# Patient Record
Sex: Female | Born: 1945 | Race: White | Hispanic: No | Marital: Married | Smoking: Former smoker
Health system: Southern US, Community
[De-identification: ages and names within clinical notes are randomized; demographics above are authoritative.]

## PROBLEM LIST (undated history)

## (undated) DIAGNOSIS — I719 Aortic aneurysm of unspecified site, without rupture: Secondary | ICD-10-CM

## (undated) DIAGNOSIS — IMO0002 Reserved for concepts with insufficient information to code with codable children: Secondary | ICD-10-CM

## (undated) DIAGNOSIS — G4700 Insomnia, unspecified: Secondary | ICD-10-CM

## (undated) DIAGNOSIS — K219 Gastro-esophageal reflux disease without esophagitis: Secondary | ICD-10-CM

## (undated) DIAGNOSIS — M48061 Spinal stenosis, lumbar region without neurogenic claudication: Secondary | ICD-10-CM

## (undated) DIAGNOSIS — D510 Vitamin B12 deficiency anemia due to intrinsic factor deficiency: Secondary | ICD-10-CM

## (undated) DIAGNOSIS — F419 Anxiety disorder, unspecified: Secondary | ICD-10-CM

## (undated) DIAGNOSIS — F17200 Nicotine dependence, unspecified, uncomplicated: Secondary | ICD-10-CM

## (undated) DIAGNOSIS — D497 Neoplasm of unspecified behavior of endocrine glands and other parts of nervous system: Secondary | ICD-10-CM

## (undated) DIAGNOSIS — B009 Herpesviral infection, unspecified: Secondary | ICD-10-CM

## (undated) DIAGNOSIS — M1990 Unspecified osteoarthritis, unspecified site: Secondary | ICD-10-CM

## (undated) DIAGNOSIS — F329 Major depressive disorder, single episode, unspecified: Secondary | ICD-10-CM

## (undated) DIAGNOSIS — J45909 Unspecified asthma, uncomplicated: Secondary | ICD-10-CM

## (undated) DIAGNOSIS — J329 Chronic sinusitis, unspecified: Secondary | ICD-10-CM

## (undated) DIAGNOSIS — D099 Carcinoma in situ, unspecified: Secondary | ICD-10-CM

## (undated) DIAGNOSIS — J449 Chronic obstructive pulmonary disease, unspecified: Secondary | ICD-10-CM

## (undated) DIAGNOSIS — I10 Essential (primary) hypertension: Secondary | ICD-10-CM

## (undated) DIAGNOSIS — M8580 Other specified disorders of bone density and structure, unspecified site: Secondary | ICD-10-CM

## (undated) DIAGNOSIS — F32A Depression, unspecified: Secondary | ICD-10-CM

## (undated) DIAGNOSIS — M722 Plantar fascial fibromatosis: Secondary | ICD-10-CM

## (undated) DIAGNOSIS — I2510 Atherosclerotic heart disease of native coronary artery without angina pectoris: Secondary | ICD-10-CM

## (undated) DIAGNOSIS — Q2381 Bicuspid aortic valve: Secondary | ICD-10-CM

## (undated) DIAGNOSIS — G5603 Carpal tunnel syndrome, bilateral upper limbs: Secondary | ICD-10-CM

## (undated) DIAGNOSIS — C801 Malignant (primary) neoplasm, unspecified: Secondary | ICD-10-CM

## (undated) DIAGNOSIS — Z95810 Presence of automatic (implantable) cardiac defibrillator: Secondary | ICD-10-CM

## (undated) DIAGNOSIS — I872 Venous insufficiency (chronic) (peripheral): Secondary | ICD-10-CM

## (undated) DIAGNOSIS — E538 Deficiency of other specified B group vitamins: Secondary | ICD-10-CM

## (undated) DIAGNOSIS — Q231 Congenital insufficiency of aortic valve: Secondary | ICD-10-CM

## (undated) HISTORY — PX: CATARACT EXTRACTION W/ INTRAOCULAR LENS  IMPLANT, BILATERAL: SHX1307

## (undated) HISTORY — DX: Chronic sinusitis, unspecified: J32.9

## (undated) HISTORY — DX: Deficiency of other specified B group vitamins: E53.8

## (undated) HISTORY — PX: CATARACT EXTRACTION: SUR2

## (undated) HISTORY — DX: Nicotine dependence, unspecified, uncomplicated: F17.200

## (undated) HISTORY — PX: COLONOSCOPY: SHX174

## (undated) HISTORY — PX: EYE SURGERY: SHX253

## (undated) HISTORY — DX: Vitamin B12 deficiency anemia due to intrinsic factor deficiency: D51.0

## (undated) HISTORY — DX: Herpesviral infection, unspecified: B00.9

## (undated) HISTORY — DX: Plantar fascial fibromatosis: M72.2

## (undated) HISTORY — DX: Other specified disorders of bone density and structure, unspecified site: M85.80

## (undated) HISTORY — DX: Depression, unspecified: F32.A

## (undated) HISTORY — DX: Unspecified osteoarthritis, unspecified site: M19.90

## (undated) HISTORY — DX: Bicuspid aortic valve: Q23.81

## (undated) HISTORY — DX: Anxiety disorder, unspecified: F41.9

## (undated) HISTORY — PX: BUNIONECTOMY WITH HAMMERTOE RECONSTRUCTION: SHX5600

## (undated) HISTORY — DX: Carcinoma in situ, unspecified: D09.9

## (undated) HISTORY — DX: Insomnia, unspecified: G47.00

## (undated) HISTORY — PX: FUNCTIONAL ENDOSCOPIC SINUS SURGERY: SUR616

## (undated) HISTORY — PX: UPPER GASTROINTESTINAL ENDOSCOPY: SHX188

## (undated) HISTORY — PX: MULTIPLE TOOTH EXTRACTIONS: SHX2053

## (undated) HISTORY — DX: Neoplasm of unspecified behavior of endocrine glands and other parts of nervous system: D49.7

## (undated) HISTORY — DX: Unspecified asthma, uncomplicated: J45.909

## (undated) HISTORY — DX: Carpal tunnel syndrome, bilateral upper limbs: G56.03

## (undated) HISTORY — DX: Major depressive disorder, single episode, unspecified: F32.9

## (undated) HISTORY — DX: Reserved for concepts with insufficient information to code with codable children: IMO0002

## (undated) HISTORY — PX: CYST EXCISION: SHX5701

## (undated) HISTORY — DX: Aortic aneurysm of unspecified site, without rupture: I71.9

## (undated) HISTORY — DX: Congenital insufficiency of aortic valve: Q23.1

## (undated) HISTORY — PX: OTHER SURGICAL HISTORY: SHX169

---

## 1997-04-29 DIAGNOSIS — D099 Carcinoma in situ, unspecified: Secondary | ICD-10-CM

## 1997-04-29 HISTORY — DX: Carcinoma in situ, unspecified: D09.9

## 1998-04-25 ENCOUNTER — Other Ambulatory Visit: Admission: RE | Admit: 1998-04-25 | Discharge: 1998-04-25 | Payer: Self-pay | Admitting: Obstetrics and Gynecology

## 1998-05-26 ENCOUNTER — Other Ambulatory Visit: Admission: RE | Admit: 1998-05-26 | Discharge: 1998-05-26 | Payer: Self-pay | Admitting: Obstetrics and Gynecology

## 1999-05-16 ENCOUNTER — Other Ambulatory Visit: Admission: RE | Admit: 1999-05-16 | Discharge: 1999-05-16 | Payer: Self-pay | Admitting: Obstetrics and Gynecology

## 2000-05-27 ENCOUNTER — Other Ambulatory Visit: Admission: RE | Admit: 2000-05-27 | Discharge: 2000-05-27 | Payer: Self-pay | Admitting: Obstetrics and Gynecology

## 2001-05-27 ENCOUNTER — Other Ambulatory Visit: Admission: RE | Admit: 2001-05-27 | Discharge: 2001-05-27 | Payer: Self-pay | Admitting: Obstetrics and Gynecology

## 2002-05-31 ENCOUNTER — Other Ambulatory Visit: Admission: RE | Admit: 2002-05-31 | Discharge: 2002-05-31 | Payer: Self-pay | Admitting: Obstetrics and Gynecology

## 2003-06-23 ENCOUNTER — Other Ambulatory Visit: Admission: RE | Admit: 2003-06-23 | Discharge: 2003-06-23 | Payer: Self-pay | Admitting: Obstetrics and Gynecology

## 2004-06-25 ENCOUNTER — Other Ambulatory Visit: Admission: RE | Admit: 2004-06-25 | Discharge: 2004-06-25 | Payer: Self-pay | Admitting: Obstetrics and Gynecology

## 2005-06-26 ENCOUNTER — Other Ambulatory Visit: Admission: RE | Admit: 2005-06-26 | Discharge: 2005-06-26 | Payer: Self-pay | Admitting: Addiction Medicine

## 2006-07-03 ENCOUNTER — Other Ambulatory Visit: Admission: RE | Admit: 2006-07-03 | Discharge: 2006-07-03 | Payer: Self-pay | Admitting: Obstetrics and Gynecology

## 2006-12-30 HISTORY — PX: CARPAL TUNNEL RELEASE: SHX101

## 2007-08-11 ENCOUNTER — Other Ambulatory Visit: Admission: RE | Admit: 2007-08-11 | Discharge: 2007-08-11 | Payer: Self-pay | Admitting: Obstetrics and Gynecology

## 2007-12-31 HISTORY — PX: OTHER SURGICAL HISTORY: SHX169

## 2008-01-26 ENCOUNTER — Ambulatory Visit: Payer: Self-pay | Admitting: Surgery

## 2009-02-23 ENCOUNTER — Ambulatory Visit: Payer: Self-pay | Admitting: Obstetrics and Gynecology

## 2009-02-23 ENCOUNTER — Encounter: Payer: Self-pay | Admitting: Obstetrics and Gynecology

## 2009-02-23 ENCOUNTER — Other Ambulatory Visit: Admission: RE | Admit: 2009-02-23 | Discharge: 2009-02-23 | Payer: Self-pay | Admitting: Obstetrics and Gynecology

## 2009-08-10 ENCOUNTER — Ambulatory Visit: Payer: Self-pay | Admitting: Obstetrics and Gynecology

## 2010-02-26 ENCOUNTER — Other Ambulatory Visit: Admission: RE | Admit: 2010-02-26 | Discharge: 2010-02-26 | Payer: Self-pay | Admitting: Obstetrics and Gynecology

## 2010-02-26 ENCOUNTER — Ambulatory Visit: Payer: Self-pay | Admitting: Obstetrics and Gynecology

## 2010-03-01 ENCOUNTER — Ambulatory Visit: Payer: Self-pay | Admitting: Obstetrics and Gynecology

## 2010-04-24 ENCOUNTER — Ambulatory Visit: Payer: Self-pay | Admitting: Obstetrics and Gynecology

## 2011-01-20 ENCOUNTER — Encounter: Payer: Self-pay | Admitting: Surgery

## 2011-03-06 ENCOUNTER — Encounter: Payer: Self-pay | Admitting: Obstetrics and Gynecology

## 2011-03-14 ENCOUNTER — Encounter: Payer: Self-pay | Admitting: Obstetrics and Gynecology

## 2011-04-17 ENCOUNTER — Encounter (INDEPENDENT_AMBULATORY_CARE_PROVIDER_SITE_OTHER): Payer: BC Managed Care – PPO | Admitting: Obstetrics and Gynecology

## 2011-04-17 ENCOUNTER — Other Ambulatory Visit (HOSPITAL_COMMUNITY)
Admission: RE | Admit: 2011-04-17 | Discharge: 2011-04-17 | Disposition: A | Discharge status: Home / Self Care | Payer: BC Managed Care – PPO | Source: Ambulatory Visit | Attending: Obstetrics and Gynecology | Admitting: Obstetrics and Gynecology

## 2011-04-17 ENCOUNTER — Other Ambulatory Visit: Payer: Self-pay | Admitting: Obstetrics and Gynecology

## 2011-04-17 DIAGNOSIS — Z01419 Encounter for gynecological examination (general) (routine) without abnormal findings: Secondary | ICD-10-CM

## 2011-04-17 DIAGNOSIS — Z13220 Encounter for screening for lipoid disorders: Secondary | ICD-10-CM

## 2011-04-17 DIAGNOSIS — Z124 Encounter for screening for malignant neoplasm of cervix: Secondary | ICD-10-CM | POA: Insufficient documentation

## 2011-05-14 NOTE — Consult Note (Signed)
NEW PATIENT CONSULTATION   Carol Warren, Carol Warren  DOB:  May 29, 1946                                        January 26, 2008  CHART #:  [id number]   REASON FOR CONSULTATION:  Ascending aortic aneurysm.   CLINICAL HISTORY:  I was asked by Dr.  Sudie Bailey to evaluate Carol Warren  for an ascending aortic aneurysm.  She is a 65 year old woman with a  history of heavy ongoing smoking who reports that she has felt poorly  since October 2008.  She felt like she was getting a typical sinus  infection that she gets frequently in the fall but it just did not get  better.  She reported symptoms of depression as well as marked fatigue  and inability to get very much done.  She was started on an  antidepressant and said that she feels somewhat better from that. She  said she was also recently started on another antibiotic and some  steroid nasal spray which has helped her sinusitis dramatically.  Given  her heavy smoking history and her symptoms she underwent a CT scan of  the chest.  This showed a small ascending aortic aneurysm with a maximum  diameter of about 4 cm in the mid ascending aorta.  There is mild  atherosclerotic calcification of the thoracic aorta as well as of the  aortic valve.  There were no pulmonary lesions identified and no  adenopathy.  A 1.7 x 1 cm right adrenal mass is also identified  consistent with adrenal adenoma.   REVIEW OF SYSTEMS:  GENERAL:  She denies any fever or chills.  She has  had no recent weight changes.  Her appetite has been stable. She does  report fatigue that has been present for many months.  HEENT:  Eyes:  Negative. ENT, negative.  ENDOCRINE:  She denies diabetes and  hypothyroidism.  CARDIOVASCULAR:  She denies any chest pain or pressure.  She does have exertional dyspnea.  She denies PND or orthopnea.  She has  a history of a heart murmur. She denies any palpitations or peripheral  edema.  RESPIRATORY:  She denies cough or sputum  production. She does  have wheezing.  GI:  She denies nausea or vomiting.  She has had no  melena or bright red blood per rectum. She does have reflux type  symptoms. GU:  She denies dysuria and hematuria. She does have urinary  frequency.  VASCULAR:  She denies claudication or phlebitis.  She has  never had DVT.  NEUROLOGICAL: She does report some dizziness at times.  She has never had a TIA or a stroke.  She denies focal weakness or  numbness.  MUSCULOSKELETAL: She has arthritis.  PSYCHIATRIC: She reports  symptoms of anxiety and depression.  HEMATOLOGICAL:  She denies a  history of bleeding disorders or easy bleeding.   ALLERGIES:  None.  She does have an intolerance to [facility]  and  DARVOCET  which she calls nausea.   PAST MEDICAL HISTORY:  Significant for COPD.  She has a history of  gastroesophageal reflux. She has a history of irritable bowel syndrome.  She has a history of degenerative disk disease.  She has a history of  bronchitis.  She is status post bilateral carpal tunnel surgery.  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   SOCIAL HISTORY:  She is married and lives with her husband.  She does  not work.  She has 2 children one of whom lives at home.  She smokes at  least 1 pack of cigarettes per day but denies alcohol abuse.   FAMILY HISTORY:  Negative for cardiac disease. Her father died of  pancreatic cancer.   PHYSICAL EXAMINATION:  VITAL SIGNS:  Blood pressure 145/89.  Pulse 90  and regular.  Respiratory rate is 18 and unlabored.  Oxygen saturation  on room air is 97 percent.  GENERAL:  She is a thin white female in no distress.  HEENT:  Shows to be normocephalic and atraumatic.  Pupils are equal,  round, and reactive to light and accommodation.  Extraocular muscles  intact.  Her throat is clear.  NECK:  Shows normal carotid pulses bilaterally.  There is a transmitted  murmur on both sides of her neck.  There is no adenopathy or   thyromegaly.  CARDIAC:  Shows a regular rate and rhythm with a normal  S1 and S2.  There is a grade 2/6 systolic murmur over the aorta.  LUNGS:  Clear with distant breath sounds throughout.  ABDOMINAL:  Shows active bowel sounds.  Abdomen is soft, flat, and non-  tender.  There are no palpable masses or organomegaly.  EXTREMITIES:  Exam shows no peripheral edema.  Pedal pulses are palpable  bilaterally.  SKIN:  Warm and dry.  NEUROLOGICAL:  Alert and oriented times 3.  Motor and sensory  examination is grossly normal.   CURRENT MEDICATIONS:  1. Flovent 220 micrograms b.i.d.  2. Loratadine 10 mg daily.  3. Fluticasone nasal spray 50 micrograms b.i.d.  4. Singulair 10 mg daily.  5. Omeprazole 20 mg 2 daily.  6. Estradiol 1 mg daily.  7. Hydroxyprogesterone 2.5 mg daily.  8. Trazodone 50 mg daily.  9. Pristiq 50 mg daily.  10.Cefdinir 300 mg b.i.d.  11.Calcium with vitamin D 2 daily.  12.Multivitamins daily.  13.Probiotic 1 daily.  14.Valium 5 mg daily p.r.n.  15.Mucinex p.r.n.   IMPRESSION:  Carol Warren has a small ascending aortic aneurysm at maximum  dimension of 4 cm.  I do not think this requires treatment at the time  but should be followed up in 1 year with her CT angiogram of the chest.  I usually do not recommend surgery unless the aneurysm is enlarging on  successive CT scans or has reached a diameter of 5.5 cm.  The adrenal  adenoma should be followed with her CT scan.  She does have a heart  murmur on examination and calcification in her aortic valve and I think  she should have a baseline echocardiogram to evaluate her aortic valve  and determine whether there is any degree of stenosis present.  I would  also recommend considering a stress test given her risk factors for  cardiac disease and her symptoms of persistent fatigue since fall 2008.  These symptoms certainly could be due to depression but fatigue is the  most common symptoms of significant atherosclerotic  coronary disease  that we see in women these days.  I will plan to see her back in 1 year  for followup CT angiogram of the chest to evaluate her aortic aneurysm  and will leave the decision to pursue an echocardiogram and stress test  with Dr. Sudie Bailey.   Evelene Croon, M.D.  Electronically Signed   BB/MEDQ  D:  01/26/2008  T:  01/27/2008  Job:  [id number]

## 2011-08-19 ENCOUNTER — Telehealth: Payer: Self-pay | Admitting: *Deleted

## 2011-08-19 NOTE — Telephone Encounter (Signed)
Carol Warren, I reviewed patient's chart we can put her on an oral estradiol 1 mg tablet that she can take daily and then used to Prometrium 200 mg orally for 12 days of the month. If she has any breakthrough bleeding other than a cyclical once a month bleed she will need to contact the office in followup with Dr. Eda Paschal. Will prescribe 30 tablets with 12 refills.

## 2011-08-19 NOTE — Telephone Encounter (Signed)
Patient c/o her estradiol patch not staying on, she is having to replace it all the time. She is asking for an estradiol pill. Please advise.

## 2011-08-20 MED ORDER — ESTRADIOL 1 MG PO TABS: 1.0000 mg | ORAL_TABLET | Freq: Every day | ORAL | Status: DC

## 2011-08-20 NOTE — Telephone Encounter (Signed)
Pt informed of Dr Fontaine No note and Rx sent to pharmacy.

## 2011-09-18 ENCOUNTER — Telehealth: Payer: Self-pay

## 2011-09-18 NOTE — Telephone Encounter (Signed)
YOU CHANGED PT ON 08-19-11 TO ESTRADIOL PILL IN DR. G'S PREVIOUS ABSENCE. ESTRADIOL ON LONG TERM BACK ORDER AND IS ON HER LAST PILL. WHAT CAN SHE SWITCH TO UNTIL ESTRADIOL PILLS AVAILABLE AGAIN?

## 2011-09-18 NOTE — Telephone Encounter (Signed)
Patient will be called and Estrace (generic) 1 mg to take by mouth daily scissors the shortage of transdermal estrogen patches. She still stay on the Prometrium 200 mg one tablet daily for 12 days of the month.

## 2011-09-19 NOTE — Telephone Encounter (Signed)
AFTER I NOTFIED PT. OF DR. FERNANDEZ'S NOTE BELOW 09-18-11. STATES SHE CAN NOT AFFORD BRAND PILLS. CHECKED WITH PHARMACY AFTER SHE HUNG UP WITH ME AND CHECKED WITH PHARMACY TO SEE IF SHE COULD SWITCH BACK TO THE GENERIC PATCHES UNTIL GENERIC ESTRADIOL AVAILABLE AGAIN. PT. CALLED BACK AND STATES HER [facility] JUST RECEIVED A SHIPMENT OF THE GENERIC ESTRADIOL.

## 2011-10-24 DIAGNOSIS — B009 Herpesviral infection, unspecified: Secondary | ICD-10-CM | POA: Insufficient documentation

## 2011-10-24 DIAGNOSIS — I719 Aortic aneurysm of unspecified site, without rupture: Secondary | ICD-10-CM | POA: Insufficient documentation

## 2011-10-24 DIAGNOSIS — F17200 Nicotine dependence, unspecified, uncomplicated: Secondary | ICD-10-CM | POA: Insufficient documentation

## 2011-10-24 DIAGNOSIS — M8580 Other specified disorders of bone density and structure, unspecified site: Secondary | ICD-10-CM | POA: Insufficient documentation

## 2011-10-24 DIAGNOSIS — IMO0002 Reserved for concepts with insufficient information to code with codable children: Secondary | ICD-10-CM | POA: Insufficient documentation

## 2011-10-24 DIAGNOSIS — D497 Neoplasm of unspecified behavior of endocrine glands and other parts of nervous system: Secondary | ICD-10-CM | POA: Insufficient documentation

## 2011-10-24 DIAGNOSIS — Q231 Congenital insufficiency of aortic valve: Secondary | ICD-10-CM | POA: Insufficient documentation

## 2011-10-24 DIAGNOSIS — D099 Carcinoma in situ, unspecified: Secondary | ICD-10-CM | POA: Insufficient documentation

## 2011-10-29 ENCOUNTER — Telehealth: Payer: Self-pay | Admitting: *Deleted

## 2011-10-29 NOTE — Telephone Encounter (Signed)
With the patch you can really not wean her. She just should stop the patch and not start Prometrium on NOV 1.

## 2011-10-29 NOTE — Telephone Encounter (Signed)
Pt informed

## 2011-10-29 NOTE — Telephone Encounter (Signed)
Pt wants to go off Estrogen she is on Vivelle 0.05 patch BIW and Prometrium 200 D1-12. How to wean off? pls Advise Pt states she spoke with you reagrding her low energy levels in April 2012 and you suggested to try the estrogen but shes been seeing her PCP and they believe her low energy is coming from low cortisol and low sodium.

## 2011-10-30 ENCOUNTER — Ambulatory Visit: Payer: BC Managed Care – PPO | Admitting: Obstetrics and Gynecology

## 2011-10-30 ENCOUNTER — Other Ambulatory Visit: Payer: BC Managed Care – PPO

## 2012-04-28 ENCOUNTER — Encounter: Payer: BC Managed Care – PPO | Admitting: Obstetrics and Gynecology

## 2012-05-05 ENCOUNTER — Other Ambulatory Visit: Payer: Self-pay | Admitting: Obstetrics and Gynecology

## 2012-05-05 DIAGNOSIS — M8580 Other specified disorders of bone density and structure, unspecified site: Secondary | ICD-10-CM

## 2013-03-31 ENCOUNTER — Other Ambulatory Visit: Payer: Self-pay | Admitting: Obstetrics and Gynecology

## 2013-06-23 ENCOUNTER — Encounter: Payer: Self-pay | Admitting: Internal Medicine

## 2013-06-25 ENCOUNTER — Ambulatory Visit (INDEPENDENT_AMBULATORY_CARE_PROVIDER_SITE_OTHER): Payer: Medicare Other | Admitting: Cardiology

## 2013-06-25 VITALS — BP 142/82 | HR 98 | Ht 61.0 in | Wt 134.8 lb

## 2013-06-25 DIAGNOSIS — I359 Nonrheumatic aortic valve disorder, unspecified: Secondary | ICD-10-CM

## 2013-06-25 DIAGNOSIS — I350 Nonrheumatic aortic (valve) stenosis: Secondary | ICD-10-CM

## 2013-06-25 NOTE — Progress Notes (Signed)
HPI The patient presents as a new patient for followup of aortic stenosis. She reports 3 bicuspid aortic valve.  She was previously followed [place].  She reports an echo one year ago. She also reports apparently aortic root dilatation 4 cm which has been stable on CT. She is relocating her care to our practice. She does not report any new symptoms. She does have some dyspnea but thinks this might be related to her long-standing smoking. She is apparently going to see a pulmonologist as well. She doesn't think this has deteriorated in the last year since her last echo. She doesn't report PND or orthopnea. He doesn't report cough fevers or chills about some baseline smoker's cough. She denies any chest pressure, neck or arm discomfort. She does notice palpitations, presyncope or syncope. She has been slightly limited by some problems. She is smoking cigarettes but she is determined to quit.   Allergies  Allergen Reactions  . Oxycodone   . Percocet (Oxycodone-Acetaminophen)     Current Outpatient Prescriptions  Medication Sig Dispense Refill  . Calcium Carbonate-Vitamin D (CALCIUM-D PO) Take by mouth.        . cycloSPORINE (RESTASIS) 0.05 % ophthalmic emulsion 1 drop 2 (two) times daily.        [name] DIAZEPAM PO Take 5 mg by mouth.        . estradiol (ESTRACE) 1 MG tablet Take 1 tablet (1 mg total) by mouth daily.  30 tablet  12  . Fluticasone Propionate, Inhal, (FLOVENT IN) Inhale into the lungs.        . Hydrocodone-Acetaminophen (VICODIN PO) Take by mouth.        . Lactobacillus (ACIDOPHILUS PO) Take by mouth.        . Montelukast Sodium (SINGULAIR PO) Take by mouth.        . Multiple Vitamin (MULTIVITAMIN) capsule Take 1 capsule by mouth daily.        [name] OMEPRAZOLE PO Take by mouth.        . progesterone (PROMETRIUM) 200 MG capsule Take 200 mg by mouth daily. Takes day 1-12 of month        . Ranitidine HCl (RANITIDINE 75 PO) Take by mouth.        . TRAZODONE HCL PO Take by mouth.          . valACYclovir (VALTREX) 500 MG tablet TAKE 1 TABLET A DAY  30 tablet  0   No current facility-administered medications for this visit.    Past Medical History  Diagnosis Date  . 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 Osteopenia   . HSV-1 (herpes simplex virus 1) infection   . HSV-2 (herpes simplex virus 2) infection   . Bicuspid aortic valve     CONGENITAL  . Aortic aneurysm     CARDIOLOGIST IS DR. Dulce Sellar IN [place]  . Adrenal tumor   . Degenerative disc disease     CERVICAL AND LUMBAR (DR. Ophelia Charter)  . Smoker     Past Surgical History  Procedure Laterality Date  . Excision of vulvar cis    . Sinus procedure  2009  . Excision of basal cell ca      SKIN   . Cataract extraction      X2    Family History  Problem Relation Age of Onset  . Cancer Mother     PANCREATIC  . Diabetes Father   . Hypertension Father   . Heart disease Father   .  Breast cancer Sister   . Diabetes Maternal Grandmother   . Hypertension Maternal Grandmother   . Heart disease Maternal Grandmother   . Cancer Maternal Grandfather     COLON  . Diabetes Paternal Grandmother   . Hypertension Paternal Grandmother   . Heart disease Paternal Grandmother     History   Social History  . Marital Status: Married    Spouse Name: N/A    Number of Children: N/A  . Years of Education: N/A   Occupational History  . Not on file.   Social History Main Topics  . Smoking status: Current Every Day Smoker    Types: Cigarettes  . Smokeless tobacco: Not on file  . Alcohol Use: Yes  . Drug Use:   . Sexually Active:    Other Topics Concern  . Not on file   Social History Narrative  . No narrative on file    ROS:  Recent bronchitis.  Otherwise as stated in the HPI and negative for all other systems.  PHYSICAL EXAM There were no vitals taken for this visit. GENERAL:  Well appearing HEENT:  Pupils equal round and reactive, fundi not visualized, oral mucosa unremarkable NECK:  No jugular  venous distention, waveform within normal limits, carotid upstroke brisk and symmetric, no bruits, no thyromegaly LYMPHATICS:  No cervical, inguinal adenopathy LUNGS:  Clear to auscultation bilaterally BACK:  No CVA tenderness CHEST:  Unremarkable HEART:  PMI not displaced or sustained,S1 and S2 within normal limits, no S3, no S4, no clicks, no rubs, 3/6 apical systolic murmur also at the right upper sternal border, early peaking, no diastolic murmurs ABD:  Flat, positive bowel sounds normal in frequency in pitch, no bruits, no rebound, no guarding, no midline pulsatile mass, no hepatomegaly, no splenomegaly EXT:  Mildly diminished bilateral dorsalis pedis and posterior tibialis plus pulses throughout, no edema, no cyanosis no clubbing SKIN:  No rashes no nodules NEURO:  Cranial nerves II through XII grossly intact, motor grossly intact throughout PSYCH:  Cognitively intact, oriented to person place and time  EKG:  Sinus rhythm rate 98, axis within normal limits, intervals within normal limits, no acute ST-T wave changes. 06/25/2013  ASSESSMENT AND PLAN  AORTIC STENOSIS:  I will start with an echocardiogram to further assess this which I do not suspect his severe by clinical findings. We did discuss the symptoms that could develop should she have progression.  AORTIC ROOT ANEURYSM:  This apparently has been stable and I will likely follow this up with CT after I had a chance to review the echo.  TOBACCO ABUSE:  I am delighted that she is committed to quitting smoking and we talked about the necessity of this.

## 2013-06-25 NOTE — Patient Instructions (Addendum)

## 2013-06-28 ENCOUNTER — Ambulatory Visit: Payer: BC Managed Care – PPO | Admitting: Internal Medicine

## 2013-07-05 ENCOUNTER — Institutional Professional Consult (permissible substitution): Payer: BC Managed Care – PPO | Admitting: Internal Medicine

## 2013-07-06 ENCOUNTER — Other Ambulatory Visit (HOSPITAL_COMMUNITY): Payer: Medicare Other

## 2013-07-07 ENCOUNTER — Telehealth: Payer: Self-pay | Admitting: *Deleted

## 2013-07-07 ENCOUNTER — Encounter: Payer: Self-pay | Admitting: Internal Medicine

## 2013-07-07 ENCOUNTER — Ambulatory Visit (HOSPITAL_COMMUNITY): Payer: Medicare Other | Attending: Cardiology

## 2013-07-07 ENCOUNTER — Ambulatory Visit (INDEPENDENT_AMBULATORY_CARE_PROVIDER_SITE_OTHER): Payer: Medicare Other | Admitting: Internal Medicine

## 2013-07-07 VITALS — BP 136/72 | HR 123 | Temp 97.9°F | Ht 60.75 in | Wt 133.0 lb

## 2013-07-07 DIAGNOSIS — Q224 Congenital tricuspid stenosis: Secondary | ICD-10-CM

## 2013-07-07 DIAGNOSIS — F17200 Nicotine dependence, unspecified, uncomplicated: Secondary | ICD-10-CM | POA: Insufficient documentation

## 2013-07-07 DIAGNOSIS — I359 Nonrheumatic aortic valve disorder, unspecified: Secondary | ICD-10-CM | POA: Insufficient documentation

## 2013-07-07 DIAGNOSIS — J449 Chronic obstructive pulmonary disease, unspecified: Secondary | ICD-10-CM | POA: Insufficient documentation

## 2013-07-07 DIAGNOSIS — I350 Nonrheumatic aortic (valve) stenosis: Secondary | ICD-10-CM

## 2013-07-07 DIAGNOSIS — J441 Chronic obstructive pulmonary disease with (acute) exacerbation: Secondary | ICD-10-CM

## 2013-07-07 NOTE — Telephone Encounter (Signed)
Medication list received by fax from pt.  Medication list corrected in chart based on this list sent by patient

## 2013-07-07 NOTE — Progress Notes (Signed)
Echocardiogram performed.  

## 2013-07-07 NOTE — Patient Instructions (Addendum)
The key is to stop smoking completely before smoking completely stops you - it's the most important aspect of your care  Work on inhaler technique:  relax and gently blow all the way out then take a nice smooth deep breath back in, triggering the inhaler at same time you start breathing in.  Hold for up to 5 seconds if you can.  Rinse and gargle with water when done   If your mouth or throat starts to bother you,   I suggest you time the inhaler to your dental care and after using the inhaler(s) brush teeth and tongue with a baking soda containing toothpaste and when you rinse this out, gargle with it first to see if this helps your mouth and throat.     Continue omeprazole before bfast and ranitidine at bedtime  GERD (REFLUX)  is an extremely common cause of respiratory symptoms, many times with no significant heartburn at all.    It can be treated with medication, but also with lifestyle changes including avoidance of late meals, excessive alcohol, smoking cessation, and avoid fatty foods, chocolate, peppermint, colas, red wine, and acidic juices such as orange juice.  NO MINT OR MENTHOL PRODUCTS SO NO COUGH DROPS  USE SUGARLESS CANDY INSTEAD (jolley ranchers or Stover's)  NO OIL BASED VITAMINS - use powdered substitutes.    Please schedule a follow up office visit in 6 weeks, call sooner if needed pfts

## 2013-07-07 NOTE — Progress Notes (Signed)
  Subjective:    Patient ID: Carol Warren, female    DOB: 1946-09-28  MRN: [id number]  HPI  67 yowf active smoker referred by Dr Sudie Bailey for cough and sob in setting of chronic rhinitis   07/07/2013 1st pulmonary eval/ prev eval by Gene Roebuck for allergic rhinitis and better p 10 years of shots stops she thinks around age 67 then started similar symptoms around 67 years ago > re-eval by Dr Nira Retort no shots needed, rx with clariton, singulair, advair and flonase with rare use of saba and has neb but doesn't use it.    Symptoms consist of daily nasal congestion assoc with dysphagia and globus  With symptomsworse day than night and watery nasal discharge assoc with sensation she can't get a deep breath at rest and min prod cough  Ex tol limited by weakness >> sob.   No obvious daytime variabilty or assoc c  cp or chest tightness, subjective wheeze overt sinus or hb symptoms. No unusual exp hx or h/o childhood pna/ asthma or knowledge of premature birth.   Sleeping ok without nocturnal  or early am exacerbation  of respiratory  c/o's or need for noct saba. Also denies any obvious fluctuation of symptoms with weather or environmental changes or other aggravating or alleviating factors except as outlined above      Review of Systems  Constitutional: Negative for fever, chills and unexpected weight change.  HENT: Positive for congestion, sneezing, trouble swallowing, dental problem, postnasal drip and sinus pressure. Negative for ear pain, nosebleeds, sore throat, rhinorrhea and voice change.   Eyes: Negative for visual disturbance.  Respiratory: Positive for cough and shortness of breath. Negative for choking.   Cardiovascular: Negative for chest pain and leg swelling.  Gastrointestinal: Negative for vomiting, abdominal pain and diarrhea.  Genitourinary: Negative for difficulty urinating.  Musculoskeletal: Negative for arthralgias.  Skin: Negative for rash.  Neurological: Positive for  headaches. Negative for tremors and syncope.  Hematological: Does not bruise/bleed easily.       Objective:   Physical Exam amb wf somewhat of a childlike personality very chatty and doesn't listen to question before answering inappropriately, rarely answering the question asked   HEENT mild turbinate edema.  Oropharynx no thrush or excess pnd or cobblestoning.  No JVD or cervical adenopathy. Mild accessory muscle hypertrophy. Trachea midline, nl thryroid. Chest was hyperinflated by percussion with diminished breath sounds and moderate increased exp time without wheeze. Hoover sign positive at mid inspiration. Regular rate and rhythm without murmur gallop or rub or increase P2 or edema.  Abd: no hsm, nl excursion. Ext warm without cyanosis or clubbing.    No cxr in our system        Assessment & Plan:

## 2013-07-11 NOTE — Assessment & Plan Note (Addendum)
DDX of  difficult airways managment all start with A and  include Adherence, Ace Inhibitors, Acid Reflux, Active Sinus Disease, Alpha 1 Antitripsin deficiency, Anxiety masquerading as Airways dz,  ABPA,  allergy(esp in young), Aspiration (esp in elderly), Adverse effects of DPI,  Active smokers, plus two Bs  = Bronchiectasis and Beta blocker use..and one C= CHF   Adherence is always the initial "prime suspect" and is a multilayered concern that requires a "trust but verify" approach in every patient - starting with knowing how to use medications, especially inhalers, correctly, keeping up with refills and understanding the fundamental difference between maintenance and prns vs those medications only taken for a very short course and then stopped and not refilled. The proper method of use, as well as anticipated side effects, of a metered-dose inhaler are discussed and demonstrated to the patient. Improved effectiveness after extensive coaching during this visit to a level of approximately  50% so needs work  ? Acid reflux > max rx and then return   ? Allergy > doubt given neg studies in past but may need to be repeated in symptoms persist  Active smoking discussed separately   ? chf  Note bicuspid valve > f/u planned  ? Adverse effect of advair > strongy consider trial off  Anxiety > dx of exclusion but based on her responses to questions may be higher up the differential here.

## 2013-07-11 NOTE — Assessment & Plan Note (Signed)

## 2013-07-12 ENCOUNTER — Encounter: Payer: Self-pay | Admitting: Internal Medicine

## 2013-07-13 ENCOUNTER — Telehealth: Payer: Self-pay | Admitting: Cardiology

## 2013-07-13 NOTE — Telephone Encounter (Signed)
1)  Pt calling requesting to know if she needs SBE before procedures 2)  Having eye lid surgery August 28th - is she ok to have this done under local 3)  Having surgery on foot but not scheduled yet - will be under local

## 2013-07-13 NOTE — Telephone Encounter (Signed)
New Prob      Pt has some questions regarding possible procedures she has planned and cardiac clearance. Please call.

## 2013-07-15 NOTE — Telephone Encounter (Signed)
Pt aware - copy mailed to pt to take to her MD

## 2013-07-15 NOTE — Telephone Encounter (Signed)
She does not need SBE.  She is OK for the planned procedure.  Call Ms. Hammersmith with the results and send results to [facility], [place]

## 2013-07-29 ENCOUNTER — Encounter: Payer: Self-pay | Admitting: Internal Medicine

## 2013-07-29 ENCOUNTER — Ambulatory Visit (INDEPENDENT_AMBULATORY_CARE_PROVIDER_SITE_OTHER): Payer: Medicare Other | Admitting: Internal Medicine

## 2013-07-29 VITALS — BP 132/80 | HR 112 | Ht 60.5 in | Wt 133.1 lb

## 2013-07-29 DIAGNOSIS — R1013 Epigastric pain: Secondary | ICD-10-CM

## 2013-07-29 DIAGNOSIS — R160 Hepatomegaly, not elsewhere classified: Secondary | ICD-10-CM

## 2013-07-29 DIAGNOSIS — R6881 Early satiety: Secondary | ICD-10-CM

## 2013-07-29 DIAGNOSIS — Z86010 Personal history of colonic polyps: Secondary | ICD-10-CM

## 2013-07-29 NOTE — Patient Instructions (Addendum)
You have been given a separate informational sheet regarding your tobacco use, the importance of quitting and local resources to help you quit.  You have been scheduled for an endoscopy with propofol. Please follow written instructions given to you at your visit today. If you use inhalers (even only as needed), please bring them with you on the day of your procedure. Your physician has requested that you go to www.startemmi.com and enter the access code given to you at your visit today. This web site gives a general overview about your procedure. However, you should still follow specific instructions given to you by our office regarding your preparation for the procedure.  Today you have signed a ROI for [place] to obtain your records from [facility] for procedures you have had the past 10 years.  We will also fax it to Dr. Theron Arista at [facility] in [facility].  You have been scheduled for an abdominal ultrasound at [facility] (1st floor of hospital) on 08/03/13 at 9:00am. Please arrive 15 minutes prior to your appointment for registration. Make certain not to have anything to eat or drink 6 hours prior to your appointment. Should you need to reschedule your appointment, please contact radiology at [phone]. This test typically takes about 30 minutes to perform.  I appreciate the opportunity to care for you.

## 2013-07-29 NOTE — Progress Notes (Addendum)
Subjective:  Carol Kingdom, MD   Patient ID: Carol Warren, female    DOB: 08-02-1946, 67 y.o.   MRN: [id number]  HPI This elderly white woman is here because of abdominal pain and hepatomegaly. She has had chronic anorexia "I do not know what to eat so I force myself" and some post-prandial bloating and epigastric pain. She belches frequently - even during interview. She has been told she has hepatomegaly based upon spine MR showing the liver. No known liver dz. She sometimes regurgitates food. She has some pill dysphagia. No melena or rectal bleeding. She has not lost weight in fact it has risen which is attributed to immobility from foot surgery.  She reports prior endoscopic evaluations by Drs,. Chales Abrahams and Noe Gens and treatment for + H. Pylori (serology). Reports hx polyps "2 last time and I was due in 2013".  Allergies  Allergen Reactions  . Oxycodone   . Percocet (Oxycodone-Acetaminophen)    Outpatient Prescriptions Prior to Visit  Medication Sig Dispense Refill  . albuterol (ACCUNEB) 0.63 MG/3ML nebulizer solution Take 1 ampule by nebulization every 6 (six) hours as needed for wheezing.      [name] albuterol (PROVENTIL) (2.5 MG/3ML) 0.083% nebulizer solution Take 2.5 mg by nebulization 2 (two) times daily as needed for wheezing.      . Calcium Carbonate-Vitamin D (CALCIUM-D PO) Take 1 tablet by mouth daily.       [name] DIAZEPAM PO Take 10 mg by mouth 3 (three) times daily as needed.       . fluticasone (FLONASE) 50 MCG/ACT nasal spray Place 1 spray into the nose 2 (two) times daily.      [name] gabapentin (NEURONTIN) 100 MG capsule Take 100 mg by mouth 3 (three) times daily.      [name] ibuprofen (ADVIL,MOTRIN) 200 MG tablet Take 200 mg by mouth every 6 (six) hours as needed for pain.      [name] loratadine (CLARITIN) 10 MG tablet Take 10 mg by mouth daily.      . mirtazapine (REMERON) 15 MG tablet Take 15 mg by mouth at bedtime.      . Montelukast Sodium (SINGULAIR PO) Take 1 tablet by mouth daily.        . Multiple Vitamin (MULTIVITAMIN) capsule Take 1 capsule by mouth daily.        [name] OMEPRAZOLE PO Take 40 mg by mouth every morning.       . ranitidine (ZANTAC) 300 MG capsule Take 300 mg by mouth every evening.      [name] VITAMIN B1-B12 IM Inject 1 mL into the muscle once a week.      . Fluticasone-Salmeterol (ADVAIR) 250-50 MCG/DOSE AEPB Inhale 1 puff into the lungs every 12 (twelve) hours.      . mometasone (NASONEX) 50 MCG/ACT nasal spray Place 1 spray into the nose daily.       No facility-administered medications prior to visit.   Past Medical History  Diagnosis Date  . 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 Osteopenia   . HSV-1 (herpes simplex virus 1) infection   . HSV-2 (herpes simplex virus 2) infection   . Bicuspid aortic valve     CONGENITAL  . Aortic aneurysm     CARDIOLOGIST IS DR. Dulce Sellar IN [place]  . Adrenal tumor   . Degenerative disc disease     CERVICAL AND LUMBAR (DR. Ophelia Charter)  . Smoker   . Anxiety   . Chronic sinusitis   . Chronic  depression   . Osteoarthritis   . Carpal tunnel syndrome, bilateral   . Pernicious anemia   . Insomnia   . Asthma   . Pericardial cyst   . Chronic fatigue   . Vitamin B 12 deficiency   . Plantar fasciitis   . Hammer toe of right foot   . Hepatomegaly   . Rosacea    Past Surgical History  Procedure Laterality Date  . Excision of vulvar cis    . Sinus procedure  2009    x 6  . Excision of basal cell ca      SKIN   . Cataract extraction Bilateral     X2  . Carpal tunnel release Bilateral 2008  . Bunionectomy with hammertoe reconstruction Right   . Colonoscopy    . Upper gastrointestinal endoscopy     History   Social History  . Marital Status: Married    Spouse Name: N/A    Number of Children: 2  .     Occupational History  . retired    Social History Main Topics  . Smoking status: Current Every Day Smoker -- 1.00 packs/day for 50 years    Types: Cigarettes  . Smokeless tobacco: Never Used     Comment:  uses vapor cig  . Alcohol Use: No  . Drug Use: No    Family History  Problem Relation Age of Onset  . Pancreatic cancer Mother 84  . Diabetes Father   . Hypertension Father   . Heart disease Father 6    CAD  . Breast cancer Sister   . Diabetes Maternal Grandmother   . Hypertension Maternal Grandmother   . Cancer Maternal Grandfather     colon or stomach  . Hypertension Paternal Grandmother   . Heart disease Paternal Grandmother     Later onset  . Asthma Grandchild   . Diverticulosis Paternal Grandmother        Review of Systems Chronic back pain.allergies, fatigue, depression, thirst, headaches. All other ROS negative or as per HPI.    Objective:   Physical Exam General:  Well-developed, well-nourished and in no acute distress, belching frequently Eyes:  anicteric. ENT:   Mouth and posterior pharynx free of lesions. + dentures Neck:   supple w/o thyromegaly or mass.  Lungs: Clear to auscultation bilaterally but BS diminished throughout Heart:  S1S2, w/ 2/6 SEM RUSB. Abdomen:  soft, non-tender, no hepatosplenomegaly, hernia, or mass and BS+.  Lymph:  no cervical or supraclavicular adenopathy. Extremities:   no edema Skin   no rash. Neuro:  A&O x 3.  Psych:  appropriate mood and  Affect.   Data Reviewed: Dr. Thurston Hole note 06/2013 PCP info     Assessment & Plan:  Abdominal pain, epigastric  Early satiety  Hepatomegaly  Personal history of colonic polyps  1. EGD to evaluate upper GI sxs. The risks and benefits as well as alternatives of endoscopic procedure(s) have been discussed and reviewed. All questions answered. The patient agrees to proceed. 2. US Abdomen re: report of hepatomegaly and upper GISxs 3. Obtain colonoscopy and pathology records to see when colonoscopy due   Had adenoma i 2004 and adenoma in 2009 - will call her and see if we can arrange double egd/colonoscopy  I appreciate the opportunity to care for this patient. WU:[email or web address],[id number],  MD

## 2013-07-30 DIAGNOSIS — Z860100 Personal history of colon polyps, unspecified: Secondary | ICD-10-CM | POA: Insufficient documentation

## 2013-08-02 ENCOUNTER — Telehealth: Payer: Self-pay

## 2013-08-02 NOTE — Telephone Encounter (Signed)
Message copied by [place], Christal Lagerstrom E on Mon Aug 02, 2013  1:44 PM ------      Message from: Iva Boop      Created: Fri Jul 30, 2013 12:48 PM      Regarding: does need colonoscopy       Let her know that she was correct - is due for a colonoscopy because of hx polyps            Can change to EGD/colon but will need different day and/or time I think            Or we can get to colonoscopy later this year after EGD is done ------

## 2013-08-02 NOTE — Telephone Encounter (Signed)
Spoke to patient and cancelled her EGD for 08/16/13 and R/S her for an EGD/Colon per Dr. Leone Payor after reviewing her records. Appointment for pre-visit 09/01/13 at 10:30am, procedure 09/10/13 at 2:00pm.  Will need sample prep kit /coupon if available.

## 2013-08-02 NOTE — Telephone Encounter (Signed)
Left message on voice mail to call me back

## 2013-08-03 ENCOUNTER — Other Ambulatory Visit (HOSPITAL_COMMUNITY): Payer: Medicare Other

## 2013-08-04 ENCOUNTER — Other Ambulatory Visit: Payer: Self-pay

## 2013-08-05 ENCOUNTER — Telehealth: Payer: Self-pay

## 2013-08-05 NOTE — Telephone Encounter (Signed)
Patient called in to cancel her pre-visit, and colonoscopy appointments .  She also informed me that she cancelled her U/S appointment.  She has had to have spinal injections, and upcoming eye surgery in the near future.  She said it's too much to do right now.  She plans on calling back and R/S'ing all these appointments.

## 2013-08-06 ENCOUNTER — Ambulatory Visit (HOSPITAL_COMMUNITY): Payer: Medicare Other

## 2013-08-10 ENCOUNTER — Ambulatory Visit (HOSPITAL_COMMUNITY): Payer: Medicare Other

## 2013-08-16 ENCOUNTER — Encounter: Payer: Medicare Other | Admitting: Internal Medicine

## 2013-08-19 ENCOUNTER — Ambulatory Visit: Payer: Medicare Other | Admitting: Internal Medicine

## 2013-09-10 ENCOUNTER — Encounter: Payer: Medicare Other | Admitting: Internal Medicine

## 2013-09-22 ENCOUNTER — Telehealth: Payer: Self-pay | Admitting: Cardiology

## 2013-09-22 NOTE — Telephone Encounter (Signed)
Called patient back. She has had a sinus infection since 9/3. Complaining of chest pain with cough. She reported this to her PCP who placed her on Augumentin and was told that she needed to see cardiology because of the chest pain and history of AS. Offered her appointment with Dr.Hochrein at 915 am but she declined because it was too early in the AM. Set her up with Tereso Newcomer PA for tomorrow visit in the late AM.

## 2013-09-22 NOTE — Telephone Encounter (Signed)
New Problem  Pt states primary care is treating her for a server sinus infection... Pt says her entire left side of her chest and left nostril are tight. No pain just uncomfortable.  Pt wanted to know if this was normal with the sinus infection.. Due to the fact that her heart has had prior issues she wanted to be sure that it is the sinus infection and not heart related issue.[name] Please advise.

## 2013-09-23 ENCOUNTER — Ambulatory Visit: Payer: Medicare Other | Admitting: Physician Assistant

## 2013-09-30 ENCOUNTER — Telehealth: Payer: Self-pay | Admitting: *Deleted

## 2013-09-30 DIAGNOSIS — R160 Hepatomegaly, not elsewhere classified: Secondary | ICD-10-CM

## 2013-09-30 DIAGNOSIS — R109 Unspecified abdominal pain: Secondary | ICD-10-CM

## 2013-09-30 NOTE — Telephone Encounter (Signed)
Abdominal ultrasound has been scheduled at [facility] [phone]) for Thursday, 10/07/13 @ 8 am with a 7:30 am arrival. Patient to go to outpatient registration for appointment. We have faxed orders to [phone]. Patient has been advised of ultrasound appointment time, date and location and have given instructions to be NPO 6 hours prior to her test. She is also scheduled for an appointment with Dr Leone Payor for 10/12/13 @ 2:15 pm. She verbalizes understanding of this as well.

## 2013-09-30 NOTE — Telephone Encounter (Signed)
Message copied by Richardson Chiquito on Thu Sep 30, 2013 10:03 AM ------      Message from: Stan Head E      Created: Thu Sep 30, 2013  8:35 AM      Regarding: RE: follow-up       OK - order US abdomen re: hepatomegaly and abdominal pain - set up rev after            Tell her to have them send me a copy      ----- Message -----         From: Patti E [place], CMA         Sent: 09/30/2013           To: Iva Boop, MD      Subject: RE: follow-up                                            Patient currently has sinus infection, on antibiotics for 2.5 weeks .  Her eye surgery and back injections have been completed.  Has toe sugery upcoming, awaiting date for that.  She said she would like to  Set up the U/S abdomen that she had to cancel with [place] back in August.  She wants to do this at [facility] if that is ok with you.  She doesn't feel well enough to do the EGD/colonoscopy at this time.  I told her we would discuss this and I would be in touch.      ----- Message -----         From: Iva Boop, MD         Sent: 09/26/2013   2:08 PM           To: Patti E [place], CMA      Subject: follow-up                                                Please see if she is ready to do EGD and colonoscopy      She may schedule REV if desired, instead             ------

## 2013-10-12 ENCOUNTER — Ambulatory Visit: Payer: Medicare Other | Admitting: Internal Medicine

## 2013-10-20 ENCOUNTER — Telehealth: Payer: Self-pay | Admitting: Cardiology

## 2013-10-20 NOTE — Telephone Encounter (Signed)
Follow up     Pt is calling .... Pt needs a follow up appt asap but she can not get her.   Please call her    Thanks!

## 2013-10-20 NOTE — Telephone Encounter (Signed)
Pt reports she had foot surgery yesterday and the MD that did the surgery said he HR was elevated.  (HR 113)  She states she was very nervous and scared.  She is also on antibiotics for a sinus infection and feels like she is dehydrated as well.  She doesn't feel like she needs to come into the office as this time.  She will keep a check on it and call back to schedule once her foot heals.

## 2013-10-28 ENCOUNTER — Ambulatory Visit: Payer: Medicare Other | Admitting: Internal Medicine

## 2013-11-04 ENCOUNTER — Other Ambulatory Visit: Payer: Self-pay

## 2013-11-18 ENCOUNTER — Telehealth: Payer: Self-pay | Admitting: Cardiology

## 2013-11-18 NOTE — Telephone Encounter (Signed)
The pt called the office because she wanted to clarify that she is not having another foot surgery.  The pt now needs to have back surgery with Dr Otelia [facility]) due to a herniated disc as a result of her previous foot surgery. The pt has not been released from Dr Lajoyce Corners at this time from her foot surgery.  At this time the pt's surgery is not scheduled but she will require surgical clearance from Dr Antoine Poche.  Per the pt it sounds like Pam RN has already received the fax in regards to clearance. I will forward this message to [facility].

## 2013-11-18 NOTE — Telephone Encounter (Signed)
Follow Up  Pt returning call //The request for surgery was for her back and not her foot// Please call back to discuss//

## 2013-11-19 NOTE — Telephone Encounter (Signed)
Requested pt let us know when she is ready for the surgery and we can send clearance then.  When I spoke with her Wednesday she said she was in no way ready to have surgery at this time.

## 2014-01-11 ENCOUNTER — Ambulatory Visit: Payer: Medicare Other | Admitting: Nurse Practitioner

## 2014-01-18 ENCOUNTER — Encounter: Payer: Self-pay | Admitting: Cardiology

## 2014-01-31 ENCOUNTER — Ambulatory Visit (INDEPENDENT_AMBULATORY_CARE_PROVIDER_SITE_OTHER): Payer: Medicare Other | Admitting: Cardiology

## 2014-01-31 ENCOUNTER — Encounter: Payer: Self-pay | Admitting: Cardiology

## 2014-01-31 VITALS — BP 130/70 | HR 110 | Ht 61.0 in | Wt 131.0 lb

## 2014-01-31 DIAGNOSIS — I719 Aortic aneurysm of unspecified site, without rupture: Secondary | ICD-10-CM

## 2014-01-31 DIAGNOSIS — Q231 Congenital insufficiency of aortic valve: Secondary | ICD-10-CM

## 2014-01-31 NOTE — Patient Instructions (Addendum)
The current medical regimen is effective;  continue present plan and medications.  You will be contacted about scheduling your CT Angiogram to look at your Aortic Insuffiency, stenosis and size of your aorta. You are due for this in July 2015  Follow up with Dr Percival Spanish in July after your testing.

## 2014-01-31 NOTE — Progress Notes (Signed)
HPI The patient presents as a new patient for followup of aortic stenosis.  She was previously followed [place].  This is her second visit with me.  Since I saw her she has had 2 foot surgeries. She denies any new cardiovascular symptoms however. She does have long-standing dyspnea and did see Dr. Melvyn Novas but was unable to followup with pulmonary function testing. She did have an echocardiogram which demonstrated moderately severe aortic stenosis and insufficiency with a well preserved ejection fraction. She denies any new chest pressure, neck or arm discomfort. She feels drainage from her sinuses and has fullness in her left chest related to this. She denies any neck or arm discomfort. She does notice palpitations, presyncope or syncope. She has been slightly limited her foot problems and now back pain. She is smoking cigarettes  Allergies  Allergen Reactions  . Oxycodone   . Percocet [Oxycodone-Acetaminophen]     Current Outpatient Prescriptions  Medication Sig Dispense Refill  . albuterol (ACCUNEB) 0.63 MG/3ML nebulizer solution Take 1 ampule by nebulization every 6 (six) hours as needed for wheezing.      [name] albuterol (PROVENTIL) (2.5 MG/3ML) 0.083% nebulizer solution Take 2.5 mg by nebulization 2 (two) times daily as needed for wheezing.      . AMBULATORY NON FORMULARY MEDICATION Topical nasal rinse/ Tobram Budesonide Use nasal as needed      . B Complex Vitamins (VITAMIN B COMPLEX PO) Take 1 tablet by mouth daily.      . Calcium Carbonate-Vitamin D (CALCIUM-D PO) Take 1 tablet by mouth daily.       [name] DIAZEPAM PO Take 10 mg by mouth 3 (three) times daily as needed.       . fluticasone (FLONASE) 50 MCG/ACT nasal spray Place 1 spray into the nose 2 (two) times daily.      [name] gabapentin (NEURONTIN) 100 MG capsule Take 100 mg by mouth 3 (three) times daily.      [name] HYDROcodone-acetaminophen (NORCO/VICODIN) 5-325 MG per tablet Take 1 tablet by mouth every 6 (six) hours as needed for moderate  pain.      [name] ibuprofen (ADVIL,MOTRIN) 200 MG tablet Take 200 mg by mouth every 6 (six) hours as needed for pain.      [name] lidocaine (LIDODERM) 5 % Place 1 patch onto the skin daily. Remove & Discard patch within 12 hours or as directed by MD      . loratadine (CLARITIN) 10 MG tablet Take 10 mg by mouth daily.      . mirtazapine (REMERON) 30 MG tablet Take 30 mg by mouth at bedtime.      . Montelukast Sodium (SINGULAIR PO) Take 1 tablet by mouth daily.       . Multiple Vitamin (MULTIVITAMIN) capsule Take 1 capsule by mouth daily.        [name] OMEPRAZOLE PO Take 40 mg by mouth every morning.       . Probiotic Product (PROBIOTIC DAILY PO) Take by mouth daily. Ultimate flora      . ranitidine (ZANTAC) 300 MG capsule Take 300 mg by mouth every evening.      [name] VITAMIN B1-B12 IM Inject 1 mL into the muscle once a week.       No current facility-administered medications for this visit.    Past Medical History  Diagnosis Date  . 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 Osteopenia   . HSV-1 (herpes simplex virus 1) infection   . HSV-2 (herpes  simplex virus 2) infection   . Bicuspid aortic valve     CONGENITAL  . Aortic aneurysm     CARDIOLOGIST IS DR. Bettina Gavia IN [place]  . Adrenal tumor   . Degenerative disc disease     CERVICAL AND LUMBAR (DR. Lorin Mercy)  . Smoker   . Anxiety   . Chronic sinusitis   . Chronic depression   . Osteoarthritis   . Carpal tunnel syndrome, bilateral   . Pernicious anemia   . Insomnia   . Asthma   . Pericardial cyst   . Chronic fatigue   . Vitamin B 12 deficiency   . Plantar fasciitis   . Hammer toe of right foot   . Hepatomegaly   . Rosacea   . Esophagitis   . Gastritis     Past Surgical History  Procedure Laterality Date  . Excision of vulvar cis    . Sinus procedure  2009    x 6  . Excision of basal cell ca      SKIN   . Cataract extraction Bilateral     X2  . Carpal tunnel release Bilateral 2008  . Bunionectomy with hammertoe reconstruction  Right   . Colonoscopy    . Upper gastrointestinal endoscopy      ROS:  As stated in the HPI and negative for all other systems.  PHYSICAL EXAM BP 130/70  Pulse 110  Ht 5\' 1"  (1.549 m)  Wt 131 lb (59.421 kg)  BMI 24.76 kg/m2 GENERAL:  Well appearing HEENT:  Pupils equal round and reactive, fundi not visualized, oral mucosa unremarkable NECK:  No jugular venous distention, waveform within normal limits, carotid upstroke brisk and symmetric, no bruits, no thyromegaly LYMPHATICS:  No cervical, inguinal adenopathy LUNGS:  Clear to auscultation bilaterally BACK:  No CVA tenderness CHEST:  Unremarkable HEART:  PMI not displaced or sustained,S1 and S2 within normal limits, no S3, no S4, no clicks, no rubs, 3/6 apical systolic murmur also at the right upper sternal border, early peaking, no diastolic murmurs ABD:  Flat, positive bowel sounds normal in frequency in pitch, no bruits, no rebound, no guarding, no midline pulsatile mass, no hepatomegaly, no splenomegaly EXT:  Mildly diminished bilateral dorsalis pedis and posterior tibialis plus pulses throughout, no edema, no cyanosis no clubbing SKIN:  No rashes no nodules NEURO:  Cranial nerves II through XII grossly intact, motor grossly intact throughout PSYCH:  Cognitively intact, oriented to person place and time  EKG:  Sinus rhythm rate 110, LAD, intervals within normal limits, no acute ST-T wave changes. 01/31/2014  ASSESSMENT AND PLAN  AORTIC STENOSIS:  I do not think she has acute symptoms related to this. However, I will need to follow this closely. I plan on having a CT angiogram done in July particularly to size her aortic dilatation. We can also get an assessment of her regurgitant fraction and LV size and function with this.  AORTIC ROOT ANEURYSM:  As above.   TOBACCO ABUSE:  She doesn't think she would be adequate at this point although we discussed this again.  PREOP:  The patient is being considered for back surgery. She would  be at acceptable risk for this without further cardiovascular testing according to to ACC/AHA guidelines.

## 2014-02-08 ENCOUNTER — Ambulatory Visit: Payer: Medicare Other | Admitting: Cardiology

## 2014-03-14 ENCOUNTER — Telehealth: Payer: Self-pay | Admitting: Internal Medicine

## 2014-03-14 NOTE — Telephone Encounter (Signed)
I have helped the patient reschedule the procedures/pre-visit  from last fall.  She is advised that she will need to hold her pain meds the am of the procedure

## 2014-03-28 ENCOUNTER — Telehealth: Payer: Self-pay | Admitting: Cardiology

## 2014-03-28 NOTE — Telephone Encounter (Signed)
°  Patient is having a MRI Lumbar Mylogram that has been scheduled by Ortho Surg. Dr Louanne Skye. It is scheduled at [facility]. She wants to make sure its okay to have it that far away from [facility]. Please call and advise.

## 2014-03-28 NOTE — Telephone Encounter (Signed)
OK to have procedure and pt was given clearance in 01/2014 - she is aware.

## 2014-05-03 ENCOUNTER — Telehealth: Payer: Self-pay | Admitting: Cardiology

## 2014-05-03 NOTE — Telephone Encounter (Signed)
Discussed again with Dr Percival Spanish - no changes at this time.

## 2014-05-03 NOTE — Telephone Encounter (Signed)
Spoke with pt who was seen recently for sinusitis at an Urgent Care.  She was started on prednisone and an antibiotic.  She noticed her left leg started swelling and then both legs were.  She tried elevating them but it didn't help.  She went back to the Urgent Care and was given an RX for Furosemide 20 mg.  She was instructed to take one in the am and to eat a banana.  States the MD there wanted her to let Dr Percival Spanish know what was going on.  Advised I will sent this information to him for his knowledge.  She will f/u as scheduled.

## 2014-05-03 NOTE — Telephone Encounter (Signed)
New message          Pt heart rate 115 bp 138/90 ekg a little irregular / pt was seen at urgent care and wanted to let you know that prednisone makes you swell.

## 2014-05-03 NOTE — Telephone Encounter (Signed)
Move up her echocardiogram for AI.

## 2014-05-24 ENCOUNTER — Telehealth: Payer: Self-pay | Admitting: Cardiology

## 2014-05-24 ENCOUNTER — Encounter: Payer: Medicare Other | Admitting: Internal Medicine

## 2014-05-24 NOTE — Telephone Encounter (Signed)
This message came from the answering service from yesterday. Her left leg is swollen. Her internal medicine doctor,Dr Proschnaw ordered her an ultrasound, please call her back.

## 2014-05-24 NOTE — Telephone Encounter (Signed)
Per phone call from pt - she had a PV study due to the edema in her legs. She heard them say the swelling was from her valves being weak.  She thought that meant her heart valves and she was concerned.  Explained to pt that if the testing she had was on her legs they were talking about the valves in her veins.  Advised to decrease NA, keep feet and legs elevated above the level of her heart and wear compression stockings during the day.  She was given an RX for Furosemide which she states didn't help so she stopped taking it.  She is having the results of her recent testing sent to be reviewed by Dr Percival Spanish.  She will call back with further questions or concerns.

## 2014-06-21 ENCOUNTER — Telehealth: Payer: Self-pay | Admitting: Cardiology

## 2014-06-21 NOTE — Telephone Encounter (Signed)
Spoke with patient regarding appointment for CTA of chest for aortic stenosis.  Scheduled for 06/30/14 @ 11:30 am.  Patient was told to arrive at the admissions office at 10:15 am to register for lab work and CTA is scheduled for 11:30 am.  NPO 4 hours prior to testing.  Patient voiced understanding.

## 2014-06-24 ENCOUNTER — Other Ambulatory Visit: Payer: Self-pay | Admitting: *Deleted

## 2014-06-24 DIAGNOSIS — Z01810 Encounter for preprocedural cardiovascular examination: Secondary | ICD-10-CM

## 2014-06-28 ENCOUNTER — Other Ambulatory Visit: Payer: Self-pay | Admitting: Specialist

## 2014-06-28 DIAGNOSIS — M549 Dorsalgia, unspecified: Secondary | ICD-10-CM

## 2014-06-30 ENCOUNTER — Ambulatory Visit (HOSPITAL_COMMUNITY)
Admission: RE | Admit: 2014-06-30 | Discharge: 2014-06-30 | Disposition: A | Discharge status: Home / Self Care | Payer: Medicare Other | Source: Ambulatory Visit | Attending: Cardiology | Admitting: Cardiology

## 2014-06-30 DIAGNOSIS — J9819 Other pulmonary collapse: Secondary | ICD-10-CM | POA: Insufficient documentation

## 2014-06-30 DIAGNOSIS — D3500 Benign neoplasm of unspecified adrenal gland: Secondary | ICD-10-CM | POA: Insufficient documentation

## 2014-06-30 DIAGNOSIS — J438 Other emphysema: Secondary | ICD-10-CM | POA: Insufficient documentation

## 2014-06-30 DIAGNOSIS — Q231 Congenital insufficiency of aortic valve: Secondary | ICD-10-CM | POA: Insufficient documentation

## 2014-06-30 DIAGNOSIS — I719 Aortic aneurysm of unspecified site, without rupture: Secondary | ICD-10-CM | POA: Insufficient documentation

## 2014-06-30 LAB — BASIC METABOLIC PANEL
Anion gap: 15 (ref 5–15)
BUN: 19 mg/dL (ref 6–23)
CO2: 25 meq/L (ref 19–32)
Calcium: 9.7 mg/dL (ref 8.4–10.5)
Chloride: 97 mEq/L (ref 96–112)
Creatinine, Ser: 0.92 mg/dL (ref 0.50–1.10)
GFR calc Af Amer: 73 mL/min — ABNORMAL LOW (ref >90)
GFR calc non Af Amer: 63 mL/min — ABNORMAL LOW (ref >90)
Glucose, Bld: 122 mg/dL — ABNORMAL HIGH (ref 70–99)
Potassium: 4.9 mEq/L (ref 3.7–5.3)
Sodium: 137 mEq/L (ref 137–147)

## 2014-06-30 MED ORDER — IOHEXOL 350 MG/ML SOLN
80.0000 mL | Freq: Once | INTRAVENOUS | Status: AC | PRN
  Administered 2014-06-30: 80 mL via INTRAVENOUS

## 2014-07-06 ENCOUNTER — Inpatient Hospital Stay: Admission: RE | Admit: 2014-07-06 | Payer: Medicare Other | Source: Ambulatory Visit

## 2014-07-06 ENCOUNTER — Other Ambulatory Visit: Payer: Medicare Other

## 2014-07-14 ENCOUNTER — Inpatient Hospital Stay: Admission: RE | Admit: 2014-07-14 | Payer: Medicare Other | Source: Ambulatory Visit

## 2014-07-14 ENCOUNTER — Other Ambulatory Visit: Payer: Medicare Other

## 2014-08-01 ENCOUNTER — Other Ambulatory Visit: Payer: Medicare Other

## 2014-08-01 ENCOUNTER — Ambulatory Visit
Admission: RE | Admit: 2014-08-01 | Discharge: 2014-08-01 | Disposition: A | Discharge status: Home / Self Care | Payer: Medicare Other | Source: Ambulatory Visit | Attending: Specialist | Admitting: Specialist

## 2014-08-01 ENCOUNTER — Other Ambulatory Visit: Payer: Self-pay | Admitting: Specialist

## 2014-08-01 VITALS — BP 133/84 | HR 91 | Resp 17

## 2014-08-01 DIAGNOSIS — M549 Dorsalgia, unspecified: Secondary | ICD-10-CM

## 2014-08-01 DIAGNOSIS — M545 Low back pain, unspecified: Secondary | ICD-10-CM

## 2014-08-01 DIAGNOSIS — M5136 Other intervertebral disc degeneration, lumbar region: Secondary | ICD-10-CM

## 2014-08-01 MED ORDER — IBUPROFEN 400 MG PO TABS
400.0000 mg | ORAL_TABLET | Freq: Once | ORAL | Status: AC
  Administered 2014-08-01: 400 mg via ORAL

## 2014-08-01 MED ORDER — CEFAZOLIN SODIUM-DEXTROSE 2-3 GM-% IV SOLR
2.0000 g | Freq: Once | INTRAVENOUS | Status: AC
  Administered 2014-08-01: 2 g via INTRAVENOUS

## 2014-08-01 MED ORDER — SODIUM CHLORIDE 0.9 % IV SOLN
Freq: Once | INTRAVENOUS | Status: AC
  Administered 2014-08-01: 10:00:00 via INTRAVENOUS

## 2014-08-01 MED ORDER — KETOROLAC TROMETHAMINE 30 MG/ML IJ SOLN
30.0000 mg | Freq: Once | INTRAMUSCULAR | Status: AC
  Administered 2014-08-01: 30 mg via INTRAVENOUS

## 2014-08-01 MED ORDER — ONDANSETRON HCL 4 MG/2ML IJ SOLN: 4.0000 mg | Freq: Once | INTRAMUSCULAR | Status: DC

## 2014-08-01 MED ORDER — ONDANSETRON HCL 4 MG/2ML IJ SOLN
4.0000 mg | Freq: Once | INTRAMUSCULAR | Status: AC
  Administered 2014-08-01: 4 mg via INTRAVENOUS

## 2014-08-01 MED ORDER — MIDAZOLAM HCL 2 MG/2ML IJ SOLN
1.0000 mg | INTRAMUSCULAR | Status: DC | PRN
  Administered 2014-08-01: 0.5 mg via INTRAVENOUS
  Administered 2014-08-01 (×2): 1 mg via INTRAVENOUS

## 2014-08-01 MED ORDER — FENTANYL CITRATE 0.05 MG/ML IJ SOLN
25.0000 ug | INTRAMUSCULAR | Status: DC | PRN
Start: 2014-08-01 — End: 2014-08-02
  Administered 2014-08-01: 100 ug via INTRAVENOUS

## 2014-08-01 MED ORDER — HYDROCODONE-ACETAMINOPHEN 5-325 MG PO TABS
1.0000 | ORAL_TABLET | Freq: Once | ORAL | Status: AC
  Administered 2014-08-01: 1 via ORAL

## 2014-08-01 MED ORDER — IOHEXOL 180 MG/ML  SOLN: 6.0000 mL | Freq: Once | INTRAMUSCULAR | Status: AC | PRN

## 2014-08-01 NOTE — Discharge Instructions (Signed)
Discogram Post Procedure Discharge Instructions ° °1. May resume a regular diet and any medications that you routinely take (including pain medications). °2. No driving day of procedure. °3. Upon discharge go home and rest for at least 4 hours.  May use an ice pack as needed to injection sites on back.  Ice to back 30 minutes on and 30 minutes off, all day. °4. May remove bandades later, today. °5. It is not unusual to be sore for several days after this procedure. ° ° ° °Please contact our office at [phone] for the following symptoms: ° °· Fever greater than 100 degrees °· Increased swelling, pain, or redness at injection site. ° ° °Thank you for visiting [facility]. ° ° °

## 2014-08-01 NOTE — Progress Notes (Signed)
Sedation time during discogram was 12 minutes.  jkl

## 2014-08-04 ENCOUNTER — Telehealth: Payer: Self-pay | Admitting: Cardiology

## 2014-08-04 NOTE — Telephone Encounter (Signed)
Pt said she never received her CT angio chest results.also was she suppose to have a f/u appointment?

## 2014-08-04 NOTE — Telephone Encounter (Signed)
Spoke with pt, aware her CT scan has not been reviewed by dr hochrein. Follow up appt made

## 2014-09-19 ENCOUNTER — Ambulatory Visit (INDEPENDENT_AMBULATORY_CARE_PROVIDER_SITE_OTHER): Payer: Medicare Other | Admitting: Cardiology

## 2014-09-19 ENCOUNTER — Encounter: Payer: Self-pay | Admitting: Cardiology

## 2014-09-19 ENCOUNTER — Ambulatory Visit (HOSPITAL_COMMUNITY)
Admission: RE | Admit: 2014-09-19 | Discharge: 2014-09-19 | Disposition: A | Discharge status: Home / Self Care | Payer: Medicare Other | Source: Ambulatory Visit | Attending: Cardiology | Admitting: Cardiology

## 2014-09-19 VITALS — BP 110/70 | HR 109 | Ht 61.0 in | Wt 115.0 lb

## 2014-09-19 DIAGNOSIS — Q231 Congenital insufficiency of aortic valve: Secondary | ICD-10-CM

## 2014-09-19 DIAGNOSIS — I719 Aortic aneurysm of unspecified site, without rupture: Secondary | ICD-10-CM

## 2014-09-19 DIAGNOSIS — I359 Nonrheumatic aortic valve disorder, unspecified: Secondary | ICD-10-CM | POA: Diagnosis present

## 2014-09-19 NOTE — Progress Notes (Signed)
HPI The patient presents for followup of aortic stenosis.  She was previously followed at [facility].  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 Her last echo she or a year ago demonstrated no left ventricular dilatation with normal left ventricular function.  There was mild AI is noted moderately severe AI. 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 A report of this and ran off hospital though I never had these images. I did send her for CT a few months ago which demonstrated 4.7 cm dilatation at the sinus of Valsalva, 3.5 at the sinotubular junction and 4.5 in the descending aorta. I'm not sure this was change compared with previous. She returns for followup. She denies any new cardiovascular symptoms however. She does have long-standing dyspnea and did see a pulmonologist but has not followed up.   She denies any new chest pressure, neck or arm discomfort. She feels drainage from her sinuses and has fullness in her left chest related to this. She denies any neck or arm discomfort. She does notice palpitations, presyncope or syncope.  She is smoking cigarettes.  She is unfortunately mostly still limited by her back pain and apparently is going to have some kind of a spinal implant.  Allergies  Allergen Reactions  . Oxycodone Nausea Only    Current Outpatient Prescriptions  Medication Sig Dispense Refill  . albuterol (ACCUNEB) 0.63 MG/3ML nebulizer solution Take 1 ampule by nebulization every 6 (six) hours as needed for wheezing.      [name] albuterol (PROVENTIL) (2.5 MG/3ML) 0.083% nebulizer solution Take 2.5 mg by nebulization 2 (two) times daily as needed for wheezing.      . AMBULATORY NON FORMULARY MEDICATION Topical nasal rinse/ Tobram Budesonide Use nasal as needed      . B Complex Vitamins (VITAMIN B COMPLEX PO) Take 1 tablet by mouth daily.      . Calcium Carbonate-Vitamin D (CALCIUM-D PO) Take 1 tablet by mouth daily.       [name] DIAZEPAM PO Take 10 mg by mouth 3 (three) times daily as  needed.       . fluticasone (FLONASE) 50 MCG/ACT nasal spray Place 1 spray into the nose 2 (two) times daily.      [name] gabapentin (NEURONTIN) 100 MG capsule Take 100 mg by mouth 3 (three) times daily.      [name] HYDROcodone-acetaminophen (NORCO/VICODIN) 5-325 MG per tablet Take 1 tablet by mouth every 6 (six) hours as needed for moderate pain.      [name] ibuprofen (ADVIL,MOTRIN) 200 MG tablet Take 200 mg by mouth every 6 (six) hours as needed for pain.      [name] lidocaine (LIDODERM) 5 % Place 1 patch onto the skin daily. Remove & Discard patch within 12 hours or as directed by MD      . loratadine (CLARITIN) 10 MG tablet Take 10 mg by mouth daily.      . mirtazapine (REMERON) 30 MG tablet Take 30 mg by mouth at bedtime.      . Montelukast Sodium (SINGULAIR PO) Take 1 tablet by mouth daily.       . Multiple Vitamin (MULTIVITAMIN) capsule Take 1 capsule by mouth daily.        [name] OMEPRAZOLE PO Take 40 mg by mouth every morning.       . Probiotic Product (PROBIOTIC DAILY PO) Take by mouth daily. Ultimate flora      . ranitidine (ZANTAC) 300 MG capsule Take 300 mg by mouth every evening.      [name]  VITAMIN B1-B12 IM Inject 1 mL into the muscle once a week.       No current facility-administered medications for this visit.    Past Medical History  Diagnosis Date  . 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 Osteopenia   . HSV-1 (herpes simplex virus 1) infection   . HSV-2 (herpes simplex virus 2) infection   . Bicuspid aortic valve     CONGENITAL  . Aortic aneurysm     CARDIOLOGIST IS DR. Bettina Gavia IN [place]  . Adrenal tumor   . Degenerative disc disease     CERVICAL AND LUMBAR (DR. Lorin Mercy)  . Smoker   . Anxiety   . Chronic sinusitis   . Chronic depression   . Osteoarthritis   . Carpal tunnel syndrome, bilateral   . Pernicious anemia   . Insomnia   . Asthma   . Pericardial cyst   . Chronic fatigue   . Vitamin B 12 deficiency   . Plantar fasciitis   . Hammer toe of right foot   . Hepatomegaly     . Rosacea   . Esophagitis   . Gastritis     Past Surgical History  Procedure Laterality Date  . Excision of vulvar cis    . Sinus procedure  2009    x 6  . Excision of basal cell ca      SKIN   . Cataract extraction Bilateral     X2  . Carpal tunnel release Bilateral 2008  . Bunionectomy with hammertoe reconstruction Right   . Colonoscopy    . Upper gastrointestinal endoscopy      ROS:  As stated in the HPI and negative for all other systems.  PHYSICAL EXAM There were no vitals taken for this visit. GENERAL:  Well appearing HEENT:  Pupils equal round and reactive, fundi not visualized, oral mucosa unremarkable NECK:  No jugular venous distention, waveform within normal limits, carotid upstroke brisk and symmetric, no bruits, no thyromegaly LYMPHATICS:  No cervical, inguinal adenopathy LUNGS:  Clear to auscultation bilaterally BACK:  No CVA tenderness CHEST:  Unremarkable HEART:  PMI not displaced or sustained,S1 and S2 within normal limits, no S3, no S4, no clicks, no rubs, 3/6 apical systolic murmur also at the right upper sternal border, early peaking, no diastolic murmurs ABD:  Flat, positive bowel sounds normal in frequency in pitch, no bruits, no rebound, no guarding, no midline pulsatile mass, no hepatomegaly, no splenomegaly EXT:  Mildly diminished bilateral dorsalis pedis and posterior tibialis plus pulses throughout, no edema, no cyanosis no clubbing SKIN:  No rashes no nodules NEURO:  Cranial nerves II through XII grossly intact, motor grossly intact throughout PSYCH:  Cognitively intact, oriented to person place and time  EKG:  Sinus rhythm rate 109, LAD, intervals within normal limits, no acute ST-T wave changes. 09/19/2014  ASSESSMENT AND PLAN  AORTIC STENOSIS/AI:  I do not think she has acute symptoms related to this. However, I will followup with another echo.  AORTIC ROOT ANEURYSM:  I will try to obtain the old results and see if we can make any "apples to  apples comparison" of her aortic root dimensions.  She reported a 4 cm aortic not sure at what level this was reported. Certainly a 4.5 cm particularly with bicuspid valve she would need close followup to see if this is increasing her guidelines greater than 0.5 cm per year. It seems to be stable at 4.5 probably size this with CT  angiograms yearly. She may also need TEE. This would further help clarify whether this is a bicuspid valve or not.    TOBACCO ABUSE:  She doesn't think she would be able to quit at this point although we discussed this again.

## 2014-09-19 NOTE — Patient Instructions (Signed)
We are ordering an Echo

## 2014-09-19 NOTE — Progress Notes (Signed)
2D Echo Performed 09/19/2014    [name], RCS

## 2014-09-22 ENCOUNTER — Encounter: Payer: Self-pay | Admitting: Cardiology

## 2014-09-22 ENCOUNTER — Other Ambulatory Visit: Payer: Self-pay | Admitting: *Deleted

## 2014-09-22 ENCOUNTER — Telehealth: Payer: Self-pay | Admitting: Cardiology

## 2014-09-22 DIAGNOSIS — Z01810 Encounter for preprocedural cardiovascular examination: Secondary | ICD-10-CM

## 2014-09-22 DIAGNOSIS — R079 Chest pain, unspecified: Secondary | ICD-10-CM

## 2014-09-22 NOTE — Telephone Encounter (Signed)
Please Have Dr Warren Lacy or the nurse call her.She talked to Dr Warren Lacy earlier today. She need to have told him some things that she forgot to tell him.

## 2014-09-22 NOTE — Telephone Encounter (Signed)
Spoke with pt, she wanted to make dr hochrein aware she has a sinus infection and that maybe the cause of her SOB. Cath instructions discussed with the patient. Will discuss with dr Percival Spanish

## 2014-09-22 NOTE — Telephone Encounter (Signed)
Discussed with dr hochrein, pt aware will be okay to go ahead with cath

## 2014-09-23 ENCOUNTER — Other Ambulatory Visit: Payer: Self-pay | Admitting: Physician Assistant

## 2014-09-23 ENCOUNTER — Ambulatory Visit (HOSPITAL_COMMUNITY)
Admission: RE | Admit: 2014-09-23 | Discharge: 2014-09-23 | Disposition: A | Discharge status: Home / Self Care | Payer: Medicare Other | Source: Ambulatory Visit | Attending: Cardiology | Admitting: Cardiology

## 2014-09-23 ENCOUNTER — Encounter (HOSPITAL_COMMUNITY)
Admission: RE | Disposition: A | Discharge status: Home / Self Care | Payer: Self-pay | Source: Ambulatory Visit | Attending: Cardiology

## 2014-09-23 DIAGNOSIS — Q231 Congenital insufficiency of aortic valve: Secondary | ICD-10-CM | POA: Insufficient documentation

## 2014-09-23 DIAGNOSIS — R0902 Hypoxemia: Secondary | ICD-10-CM

## 2014-09-23 DIAGNOSIS — I359 Nonrheumatic aortic valve disorder, unspecified: Secondary | ICD-10-CM | POA: Diagnosis not present

## 2014-09-23 DIAGNOSIS — I428 Other cardiomyopathies: Secondary | ICD-10-CM | POA: Insufficient documentation

## 2014-09-23 DIAGNOSIS — I719 Aortic aneurysm of unspecified site, without rupture: Secondary | ICD-10-CM | POA: Diagnosis not present

## 2014-09-23 DIAGNOSIS — I2510 Atherosclerotic heart disease of native coronary artery without angina pectoris: Secondary | ICD-10-CM | POA: Diagnosis not present

## 2014-09-23 DIAGNOSIS — Z8544 Personal history of malignant neoplasm of other female genital organs: Secondary | ICD-10-CM | POA: Diagnosis not present

## 2014-09-23 DIAGNOSIS — F3289 Other specified depressive episodes: Secondary | ICD-10-CM | POA: Diagnosis not present

## 2014-09-23 DIAGNOSIS — F329 Major depressive disorder, single episode, unspecified: Secondary | ICD-10-CM | POA: Diagnosis not present

## 2014-09-23 DIAGNOSIS — R079 Chest pain, unspecified: Secondary | ICD-10-CM

## 2014-09-23 DIAGNOSIS — F17200 Nicotine dependence, unspecified, uncomplicated: Secondary | ICD-10-CM | POA: Insufficient documentation

## 2014-09-23 DIAGNOSIS — Z01810 Encounter for preprocedural cardiovascular examination: Secondary | ICD-10-CM

## 2014-09-23 HISTORY — PX: LEFT AND RIGHT HEART CATHETERIZATION WITH CORONARY ANGIOGRAM: SHX5449

## 2014-09-23 LAB — POCT I-STAT 3, ART BLOOD GAS (G3+)
Acid-base deficit: 2 mmol/L (ref 0.0–2.0)
Bicarbonate: 21.9 mEq/L (ref 20.0–24.0)
O2 Saturation: 90 %
PCO2 ART: 33.8 mmHg — AB (ref 35.0–45.0)
PH ART: 7.419 (ref 7.350–7.450)
TCO2: 23 mmol/L (ref 0–100)
pO2, Arterial: 56 mmHg — ABNORMAL LOW (ref 80.0–100.0)

## 2014-09-23 LAB — CBC
HEMATOCRIT: 37.8 % (ref 36.0–46.0)
Hemoglobin: 13 g/dL (ref 12.0–15.0)
MCH: 29.2 pg (ref 26.0–34.0)
MCHC: 34.4 g/dL (ref 30.0–36.0)
MCV: 84.9 fL (ref 78.0–100.0)
PLATELETS: 329 10*3/uL (ref 150–400)
RBC: 4.45 MIL/uL (ref 3.87–5.11)
RDW: 15.4 % (ref 11.5–15.5)
WBC: 7.6 10*3/uL (ref 4.0–10.5)

## 2014-09-23 LAB — POCT I-STAT 3, VENOUS BLOOD GAS (G3P V)
ACID-BASE DEFICIT: 1 mmol/L (ref 0.0–2.0)
Bicarbonate: 22.8 mEq/L (ref 20.0–24.0)
O2 Saturation: 53 %
PH VEN: 7.407 — AB (ref 7.250–7.300)
TCO2: 24 mmol/L (ref 0–100)
pCO2, Ven: 36.2 mmHg — ABNORMAL LOW (ref 45.0–50.0)
pO2, Ven: 28 mmHg — CL (ref 30.0–45.0)

## 2014-09-23 LAB — BASIC METABOLIC PANEL
Anion gap: 14 (ref 5–15)
BUN: 12 mg/dL (ref 6–23)
CO2: 25 meq/L (ref 19–32)
Calcium: 9.6 mg/dL (ref 8.4–10.5)
Chloride: 102 mEq/L (ref 96–112)
Creatinine, Ser: 0.81 mg/dL (ref 0.50–1.10)
GFR calc Af Amer: 85 mL/min — ABNORMAL LOW (ref >90)
GFR, EST NON AFRICAN AMERICAN: 73 mL/min — AB (ref >90)
GLUCOSE: 123 mg/dL — AB (ref 70–99)
POTASSIUM: 4 meq/L (ref 3.7–5.3)
SODIUM: 141 meq/L (ref 137–147)

## 2014-09-23 LAB — APTT: aPTT: 34 seconds (ref 24–37)

## 2014-09-23 LAB — PROTIME-INR
INR: 1.04 (ref 0.00–1.49)
Prothrombin Time: 13.6 seconds (ref 11.6–15.2)

## 2014-09-23 SURGERY — LEFT AND RIGHT HEART CATHETERIZATION WITH CORONARY ANGIOGRAM
Anesthesia: LOCAL

## 2014-09-23 MED ORDER — ACETAMINOPHEN 325 MG PO TABS
650.0000 mg | ORAL_TABLET | ORAL | Status: DC | PRN
Start: 2014-09-23 — End: 2014-09-23

## 2014-09-23 MED ORDER — ASPIRIN 81 MG PO CHEW
81.0000 mg | CHEWABLE_TABLET | ORAL | Status: AC
  Administered 2014-09-23: 81 mg via ORAL

## 2014-09-23 MED ORDER — LIDOCAINE HCL (PF) 1 % IJ SOLN
INTRAMUSCULAR | Status: AC
  Filled 2014-09-23: qty 30

## 2014-09-23 MED ORDER — ASPIRIN 81 MG PO CHEW
CHEWABLE_TABLET | ORAL | Status: AC
  Filled 2014-09-23: qty 1

## 2014-09-23 MED ORDER — HEPARIN (PORCINE) IN NACL 2-0.9 UNIT/ML-% IJ SOLN
INTRAMUSCULAR | Status: AC
  Filled 2014-09-23: qty 1000

## 2014-09-23 MED ORDER — NITROGLYCERIN 1 MG/10 ML FOR IR/CATH LAB
INTRA_ARTERIAL | Status: AC
  Filled 2014-09-23: qty 10

## 2014-09-23 MED ORDER — LOSARTAN POTASSIUM 50 MG PO TABS: 50.0000 mg | ORAL_TABLET | Freq: Every day | ORAL | Status: DC

## 2014-09-23 MED ORDER — FUROSEMIDE 10 MG/ML IJ SOLN
INTRAMUSCULAR | Status: AC
  Filled 2014-09-23: qty 4

## 2014-09-23 MED ORDER — FUROSEMIDE 20 MG PO TABS
10.0000 mg | ORAL_TABLET | Freq: Every day | ORAL | Status: DC
Start: 2014-09-23 — End: 2014-10-03

## 2014-09-23 MED ORDER — HYDROCODONE-ACETAMINOPHEN 5-325 MG PO TABS
ORAL_TABLET | ORAL | Status: AC
  Filled 2014-09-23: qty 1

## 2014-09-23 MED ORDER — HYDROCODONE-ACETAMINOPHEN 5-325 MG PO TABS
0.5000 | ORAL_TABLET | ORAL | Status: DC | PRN
  Administered 2014-09-23: 0.5 via ORAL
  Filled 2014-09-23: qty 1

## 2014-09-23 MED ORDER — SODIUM CHLORIDE 0.9 % IV SOLN
INTRAVENOUS | Status: DC
  Administered 2014-09-23: 08:00:00 via INTRAVENOUS

## 2014-09-23 MED ORDER — SODIUM CHLORIDE 0.9 % IV SOLN: INTRAVENOUS | Status: DC

## 2014-09-23 MED ORDER — ONDANSETRON HCL 4 MG/2ML IJ SOLN: 4.0000 mg | Freq: Four times a day (QID) | INTRAMUSCULAR | Status: DC | PRN

## 2014-09-23 MED ORDER — MIDAZOLAM HCL 2 MG/2ML IJ SOLN
INTRAMUSCULAR | Status: AC
  Filled 2014-09-23: qty 2

## 2014-09-23 MED ORDER — FENTANYL CITRATE 0.05 MG/ML IJ SOLN
INTRAMUSCULAR | Status: AC
  Filled 2014-09-23: qty 2

## 2014-09-23 MED ORDER — SODIUM CHLORIDE 0.9 % IJ SOLN: 3.0000 mL | INTRAMUSCULAR | Status: DC | PRN

## 2014-09-23 NOTE — Progress Notes (Signed)
Carol Warren is a 68 y/o F with aortic root dilitation, OA, chronic sinusitis, valvular disease and h/o on and off dyspnea for the last several years. She has history of AS/AI with questionable bicuspid valve. She underwent CTA of chest 06/2014 showing "maximal aortic diameter is at the sinus of Valsalva, sino-tubular junction, and ascending aorta are 4.7 cm, 3.5 cm, and 4.0 cm respectively." No obvious PE, mediastinal adenopathy, pericardial effusion, pneumothorax or pleural effusion, but there was mild emphysema and linear atelectasis and LLL. A benign right adrenal adenoma was noted. 2D echo 09/19/14 showed EF 30-35% with akinesis of the anteroseptal myocardium, grade 2 diastolic dysfunction, bicuspid aortic valve with moderate AS/AI, aortic root 38 mmHg, mild-mod MR, PA pressure 47mmHg. Due to her new low EF, she was referred for cath which was performed today. This showed:   - Mild coronary plaque - 25% mLAD, 30% prox AV groove.   -LV gradient mild but might be underestimated given estimate of LV function on echo.   - Mild AI by cath.   - Elevated EDP and significantly elevated pulmonary pressures. O2 sats were low during the procedure.  O2 sats dropped to 88% requiring 4L of O2. Dr. Percival Spanish gave her 20mg  IV Lasix in the cath lab and wanted to hold her for observation to decide admission vs discharge home. He also recommended PFTs, pulmonology referral, and home O2 initiation if needed. While in the cath lab holding area, she began to diurese significantly with improvement in resting sats to 92-95% on RA. She currently denies complaint and is beginning to feel "normal." She has a persistent sinus tach that has been present since at least 01/2014, and prior EKG in 2014 had a HR of 98. She reports that PCP checked TSH, free T4, T3 last month and that they were normal.   She also reports progressive weight loss over the last year but states she's had some depression ever since significant back pain for which  she's been treated with rounds of prednisone without relief. She says her PCP is aware of these things. She has had some night sweats, but no bleeding. She has not had a recent colonoscopy. Apparently imaging of her back has been unremarkable. I instructed her on the importance of f/u for these things with her PCP.  Tentatively, if she diureses well and hypoxia improves, we will plan to D/C home today with the following things in place (per discussion with Dr. Percival Spanish) - rx for Cozaar 50mg  daily (sent in) - rx for Lasix 10mg  daily (sent in) - outpatient pulmonology f/u - 10/17/14 at 12pm for PFTs then appointment at 1:30pm [place]  - Dr. Percival Spanish says he will consider TEE after this to further evaluate her valve - I have also set her up for a TOC appointment for CHF in our office - scheduling line was persistently busy so I left a message for our office to call her - f/u PCP for weight loss, back pain, and possible further workup of adrenal nodule/testing (?) if not already done - I spent time educating her on what CHF/low EF means including daily weights, salt/fluid restriction  If she ambulates after bedrest with recurrent hypoxia, will need to consider admission for additional diuresis and pulmonary consultation. However, I anticipate she will be stable for DC this afternoon. Her bedrest is up around 4:30pm after which time I won't be here any longer - I will sign out to my colleagues to check up on her this afternoon.  Ekansh Sherk  Charnel Giles PA-C

## 2014-09-23 NOTE — H&P (View-Only) (Signed)
HPI The patient presents for followup of aortic stenosis.  She was previously followed at [facility].  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 Her last echo she or a year ago demonstrated no left ventricular dilatation with normal left ventricular function.  There was mild AI is noted moderately severe AI. 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 A report of this and ran off hospital though I never had these images. I did send her for CT a few months ago which demonstrated 4.7 cm dilatation at the sinus of Valsalva, 3.5 at the sinotubular junction and 4.5 in the descending aorta. I'm not sure this was change compared with previous. She returns for followup. She denies any new cardiovascular symptoms however. She does have long-standing dyspnea and did see a pulmonologist but has not followed up.   She denies any new chest pressure, neck or arm discomfort. She feels drainage from her sinuses and has fullness in her left chest related to this. She denies any neck or arm discomfort. She does notice palpitations, presyncope or syncope.  She is smoking cigarettes.  She is unfortunately mostly still limited by her back pain and apparently is going to have some kind of a spinal implant.  Allergies  Allergen Reactions  . Oxycodone Nausea Only    Current Outpatient Prescriptions  Medication Sig Dispense Refill  . albuterol (ACCUNEB) 0.63 MG/3ML nebulizer solution Take 1 ampule by nebulization every 6 (six) hours as needed for wheezing.      [name] albuterol (PROVENTIL) (2.5 MG/3ML) 0.083% nebulizer solution Take 2.5 mg by nebulization 2 (two) times daily as needed for wheezing.      . AMBULATORY NON FORMULARY MEDICATION Topical nasal rinse/ Tobram Budesonide Use nasal as needed      . B Complex Vitamins (VITAMIN B COMPLEX PO) Take 1 tablet by mouth daily.      . Calcium Carbonate-Vitamin D (CALCIUM-D PO) Take 1 tablet by mouth daily.       [name] DIAZEPAM PO Take 10 mg by mouth 3 (three) times daily as  needed.       . fluticasone (FLONASE) 50 MCG/ACT nasal spray Place 1 spray into the nose 2 (two) times daily.      [name] gabapentin (NEURONTIN) 100 MG capsule Take 100 mg by mouth 3 (three) times daily.      [name] HYDROcodone-acetaminophen (NORCO/VICODIN) 5-325 MG per tablet Take 1 tablet by mouth every 6 (six) hours as needed for moderate pain.      [name] ibuprofen (ADVIL,MOTRIN) 200 MG tablet Take 200 mg by mouth every 6 (six) hours as needed for pain.      [name] lidocaine (LIDODERM) 5 % Place 1 patch onto the skin daily. Remove & Discard patch within 12 hours or as directed by MD      . loratadine (CLARITIN) 10 MG tablet Take 10 mg by mouth daily.      . mirtazapine (REMERON) 30 MG tablet Take 30 mg by mouth at bedtime.      . Montelukast Sodium (SINGULAIR PO) Take 1 tablet by mouth daily.       . Multiple Vitamin (MULTIVITAMIN) capsule Take 1 capsule by mouth daily.        [name] OMEPRAZOLE PO Take 40 mg by mouth every morning.       . Probiotic Product (PROBIOTIC DAILY PO) Take by mouth daily. Ultimate flora      . ranitidine (ZANTAC) 300 MG capsule Take 300 mg by mouth every evening.      [name]  VITAMIN B1-B12 IM Inject 1 mL into the muscle once a week.       No current facility-administered medications for this visit.    Past Medical History  Diagnosis Date  . 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 Osteopenia   . HSV-1 (herpes simplex virus 1) infection   . HSV-2 (herpes simplex virus 2) infection   . Bicuspid aortic valve     CONGENITAL  . Aortic aneurysm     CARDIOLOGIST IS DR. Bettina Gavia IN [place]  . Adrenal tumor   . Degenerative disc disease     CERVICAL AND LUMBAR (DR. Lorin Mercy)  . Smoker   . Anxiety   . Chronic sinusitis   . Chronic depression   . Osteoarthritis   . Carpal tunnel syndrome, bilateral   . Pernicious anemia   . Insomnia   . Asthma   . Pericardial cyst   . Chronic fatigue   . Vitamin B 12 deficiency   . Plantar fasciitis   . Hammer toe of right foot   . Hepatomegaly     . Rosacea   . Esophagitis   . Gastritis     Past Surgical History  Procedure Laterality Date  . Excision of vulvar cis    . Sinus procedure  2009    x 6  . Excision of basal cell ca      SKIN   . Cataract extraction Bilateral     X2  . Carpal tunnel release Bilateral 2008  . Bunionectomy with hammertoe reconstruction Right   . Colonoscopy    . Upper gastrointestinal endoscopy      ROS:  As stated in the HPI and negative for all other systems.  PHYSICAL EXAM There were no vitals taken for this visit. GENERAL:  Well appearing HEENT:  Pupils equal round and reactive, fundi not visualized, oral mucosa unremarkable NECK:  No jugular venous distention, waveform within normal limits, carotid upstroke brisk and symmetric, no bruits, no thyromegaly LYMPHATICS:  No cervical, inguinal adenopathy LUNGS:  Clear to auscultation bilaterally BACK:  No CVA tenderness CHEST:  Unremarkable HEART:  PMI not displaced or sustained,S1 and S2 within normal limits, no S3, no S4, no clicks, no rubs, 3/6 apical systolic murmur also at the right upper sternal border, early peaking, no diastolic murmurs ABD:  Flat, positive bowel sounds normal in frequency in pitch, no bruits, no rebound, no guarding, no midline pulsatile mass, no hepatomegaly, no splenomegaly EXT:  Mildly diminished bilateral dorsalis pedis and posterior tibialis plus pulses throughout, no edema, no cyanosis no clubbing SKIN:  No rashes no nodules NEURO:  Cranial nerves II through XII grossly intact, motor grossly intact throughout PSYCH:  Cognitively intact, oriented to person place and time  EKG:  Sinus rhythm rate 109, LAD, intervals within normal limits, no acute ST-T wave changes. 09/19/2014  ASSESSMENT AND PLAN  AORTIC STENOSIS/AI:  I do not think she has acute symptoms related to this. However, I will followup with another echo.  AORTIC ROOT ANEURYSM:  I will try to obtain the old results and see if we can make any "apples to  apples comparison" of her aortic root dimensions.  She reported a 4 cm aortic not sure at what level this was reported. Certainly a 4.5 cm particularly with bicuspid valve she would need close followup to see if this is increasing her guidelines greater than 0.5 cm per year. It seems to be stable at 4.5 probably size this with CT  angiograms yearly. She may also need TEE. This would further help clarify whether this is a bicuspid valve or not.    TOBACCO ABUSE:  She doesn't think she would be able to quit at this point although we discussed this again.

## 2014-09-23 NOTE — Progress Notes (Signed)
Site area: rt groin Site Prior to Removal:  Level 0 Pressure Applied For: 20 Manual:   yes Patient Status During Pull:  stable Post Pull Site:  Level 0 Post Pull Instructions Given:  yes Post Pull Pulses Present: yes Dressing Applied:  yes Bedrest begins @ 1225 Comments: no complications

## 2014-09-23 NOTE — Progress Notes (Signed)
Pt has an IV x 2 and are to saline lock.

## 2014-09-23 NOTE — Interval H&P Note (Signed)
History and Physical Interval Note:  09/23/2014 11:09 AM  Carol Warren  has presented today for surgery, with the diagnosis of abnormal echo  The various methods of treatment have been discussed with the patient and family. After consideration of risks, benefits and other options for treatment, the patient has consented to  Procedure(s): LEFT AND RIGHT HEART CATHETERIZATION WITH CORONARY ANGIOGRAM (N/A) as a surgical intervention .  The patient's history has been reviewed, patient examined, no change in status, stable for surgery.  I have reviewed the patient's chart and labs.  Questions were answered to the patient's satisfaction.   Cath Lab Visit (complete for each Cath Lab visit)  Clinical Evaluation Leading to the Procedure:   ACS: No.  Non-ACS:    Anginal Classification: No Symptoms  Anti-ischemic medical therapy: No Therapy  Non-Invasive Test Results: No non-invasive testing performed  Prior CABG: No previous CABG   Minus Breeding

## 2014-09-23 NOTE — CV Procedure (Addendum)
   Cardiac Catheterization Procedure Note  Name: Carol Warren MRN: [id number] DOB: 12-08-1946  Procedure: Right Heart Cath, Left Heart Cath, Selective Coronary Angiography, LV angiography  Indication:  Cardiomyopathy with an EF newly found to be 35% and anterior hypokinesis, AS/AI  Procedural Details: The right groin was prepped, draped, and anesthetized with 1% lidocaine. Using the modified Seldinger technique a 5 French sheath was placed in the right femoral artery and a 7 French sheath was placed in the right femoral vein. A Swan-Ganz catheter was used for the right heart catheterization. Standard protocol was followed for recording of right heart pressures and sampling of oxygen saturations. Fick cardiac output was calculated. Standard Judkins catheters were used for selective coronary angiography and left ventriculography. There were no immediate procedural complications. The patient was transferred to the post catheterization recovery area for further monitoring.  Procedural Findings:  Hemodynamics:               RA 15    RV 57/17    PA 59/28  (45)    PCWP  Mean 36    LV 157/22  EDP 32    AO 148/88    AoV  Gradient mean  12.7     AoV  Area   0.84 cm2    Oxygen saturations:    PA 53%    AO 90%   Cardiac Output (Fick) 3.03                               Cardiac Index (Fick) 2.03   Coronary angiography:  Coronary dominance: right  Left mainstem: Normal  Left anterior descending (LAD): Wraps the apex.  Mild mid 25% stenosis.  D1 is large with luminal irregularities.    Left circumflex (LCx):  AV groove proximal 30% after large MOM.  MOM large branching and normal.  OM2 moderate sized and normal.  Right coronary artery (RCA): High anterior take up best approached with an Amplatz left catheter.  Normal.   Left ventriculography: Left ventricle was not injected secondary to high EDP.  I did cross the valve for pressures.  Despite significant difficulty crossing the valve  there was not a high measured gradient.  AO Root:  Mild AI.  There did appear to be aortic root dilatation given the small size of the patient.   Final Conclusions:  Mild coronary plaque.  LV gradient mild but might be underestimated given estimate of LV function on echo.  Mild AI by cath.  Elevated EDP and significantly elevated pulmonary pressures.  O2 sats were low during the procedure.    Recommendations: I will order PFTs.  She will need to see pulmonary.  She needs to stop smoking.  I will consider a TEE after this to further evaluate her valve.  She would benefit from diuresis and salt restriction.     Minus Breeding 09/23/2014, 11:15 AM

## 2014-09-23 NOTE — Progress Notes (Signed)
O2 off(per Hinton Dyer, [place]).

## 2014-09-23 NOTE — Progress Notes (Signed)
Client up and walked and tolerated well; O2 sat with walking stayed 95% and Antony Salmon notified and ok to d/c home

## 2014-09-23 NOTE — Discharge Instructions (Signed)

## 2014-09-23 NOTE — Progress Notes (Signed)
IV rt AC w/0.9NS at [facility]. Saline lock lt hand

## 2014-09-28 ENCOUNTER — Telehealth: Payer: Self-pay | Admitting: Cardiology

## 2014-09-28 NOTE — Telephone Encounter (Signed)
Carol Warren is wanting to know should she be taking more Lasix because she is not going to bathroom more . Please call    Thanks

## 2014-09-28 NOTE — Telephone Encounter (Signed)
Spoke to patient Patient doing daily weight - no change 1 up or down from previous weight 111. No swelling noted in lower extremities.  RN informed patient to continue with current medications and keep appointment with Truitt Merle She verbalized understanding.

## 2014-10-03 ENCOUNTER — Ambulatory Visit (INDEPENDENT_AMBULATORY_CARE_PROVIDER_SITE_OTHER): Payer: Medicare Other | Admitting: Nurse Practitioner

## 2014-10-03 ENCOUNTER — Encounter: Payer: Self-pay | Admitting: Nurse Practitioner

## 2014-10-03 VITALS — BP 130/86 | HR 100 | Ht 61.0 in | Wt 116.8 lb

## 2014-10-03 DIAGNOSIS — I428 Other cardiomyopathies: Secondary | ICD-10-CM

## 2014-10-03 DIAGNOSIS — I429 Cardiomyopathy, unspecified: Secondary | ICD-10-CM

## 2014-10-03 DIAGNOSIS — R0600 Dyspnea, unspecified: Secondary | ICD-10-CM

## 2014-10-03 DIAGNOSIS — I359 Nonrheumatic aortic valve disorder, unspecified: Secondary | ICD-10-CM

## 2014-10-03 LAB — BASIC METABOLIC PANEL
BUN: 14 mg/dL (ref 6–23)
CO2: 27 mEq/L (ref 19–32)
Calcium: 9.1 mg/dL (ref 8.4–10.5)
Chloride: 100 mEq/L (ref 96–112)
Creatinine, Ser: 0.9 mg/dL (ref 0.4–1.2)
GFR: 63.79 mL/min (ref >60.00)
Glucose, Bld: 98 mg/dL (ref 70–99)
Potassium: 4.3 mEq/L (ref 3.5–5.1)
Sodium: 134 mEq/L — ABNORMAL LOW (ref 135–145)

## 2014-10-03 LAB — BRAIN NATRIURETIC PEPTIDE: Pro B Natriuretic peptide (BNP): 1261 pg/mL — ABNORMAL HIGH (ref 0.0–100.0)

## 2014-10-03 MED ORDER — FUROSEMIDE 20 MG PO TABS: 20.0000 mg | ORAL_TABLET | Freq: Every day | ORAL | Status: DC

## 2014-10-03 NOTE — Patient Instructions (Addendum)
We will be checking the following labs today BMET and BNP  Stay on your current medicines but increase the Lasix to a whole pill each day  We will arrange for a TEE  We will change your pulmonary consult/PFTs  (to anyone but Dr. Melvyn Novas - per patient request)  See Dr. Percival Spanish in 2 to 3 weeks for discussion  Call the [place] office at [phone] if you have any questions, problems or concerns.    You are scheduled for a TEE on Thursday October 8th at 11 am with Dr. Radford Pax or associates. Please go to [facility] [place] Stay on Thursday, October 8th at 9:30.  Enter through the [place] not have any food or drink after midnight on Wednesday .  You may take your medicines with a sip of water on the day of your procedure.  You will need someone to drive you home following your procedure.   Transesophageal Echocardiogram Transesophageal echocardiography (TEE) is a special type of test that produces images of the heart by using sound waves (echocardiogram). This type of echocardiography can obtain better images of the heart than standard echocardiography. TEE is done by passing a flexible tube down the esophagus. The heart is located in front of the esophagus. Because the heart and esophagus are close to one another, your health care provider can take very clear, detailed pictures of the heart via ultrasound waves. TEE may be done:  If your health care provider needs more information based on standard echocardiography findings.  If you had a stroke. This might have happened because a clot formed in your heart. TEE can visualize different areas of the heart and check for clots.  To check valve anatomy and function.  To check for infection on the inside of your heart (endocarditis).  To evaluate the dividing wall (septum) of the heart and presence of a hole that did not close after birth (patent foramen ovale or atrial septal  defect).  To help diagnose a tear in the wall of the aorta (aortic dissection).  During cardiac valve surgery. This allows the surgeon to assess the valve repair before closing the chest.  During a variety of other cardiac procedures to guide positioning of catheters.  Sometimes before a cardioversion, which is a shock to convert heart rhythm back to normal. LET [facility] CARE PROVIDER KNOW ABOUT:   Any allergies you have.  All medicines you are taking, including vitamins, herbs, eye drops, creams, and over-the-counter medicines.  Previous problems you or members of your family have had with the use of anesthetics.  Any blood disorders you have.  Previous surgeries you have had.  Medical conditions you have.  Swallowing difficulties.  An esophageal obstruction. RISKS AND COMPLICATIONS  Generally, TEE is a safe procedure. However, as with any procedure, complications can occur. Possible complications include an esophageal tear (rupture). BEFORE THE PROCEDURE   Do not eat or drink for 6 hours before the procedure or as directed by your health care provider.  Arrange for someone to drive you home after the procedure. Do not drive yourself home. During the procedure, you will be given medicines that can continue to make you feel drowsy and can impair your reflexes.  An IV access tube will be started in the arm. PROCEDURE   A medicine to help you relax (sedative) will be given through the IV access tube.  A medicine may be sprayed or gargled to numb  the back of the throat.  Your blood pressure, heart rate, and breathing (vital signs) will be monitored during the procedure.  The TEE probe is a long, flexible tube. The tip of the probe is placed into the back of the mouth, and you will be asked to swallow. This helps to pass the tip of the probe into the esophagus. Once the tip of the probe is in the correct area, your health care provider can take pictures of the heart.  TEE  is usually not a painful procedure. You may feel the probe press against the back of the throat. The probe does not enter the trachea and does not affect your breathing. AFTER THE PROCEDURE   You will be in bed, resting, until you have fully returned to consciousness.  When you first awaken, your throat may feel slightly sore and will probably still feel numb. This will improve slowly over time.  You will not be allowed to eat or drink until it is clear that the numbness has improved.  Once you have been able to drink, urinate, and sit on the edge of the bed without feeling sick to your stomach (nausea) or dizzy, you may be cleared to go home.  You should have a friend or family member with you for the next 24 hours after your procedure. Document Released: 03/08/2003 Document Revised: 12/21/2013 Document Reviewed: 06/17/2013 [facility] Patient Information 2015 [place], [place]. This information is not intended to replace advice given to you by your health care provider. Make sure you discuss any questions you have with your health care provider.

## 2014-10-03 NOTE — Progress Notes (Signed)
Carol Warren Date of Birth: May 07, 1946 Medical Record #[id number]  History of Present Illness: Carol Warren is seen back today for a post hospital visit. Seen for Carol Warren. She is a 68 year old female with AS. Previously followed by [facility]. ?of bicuspid valve. Other issues include dilated aorta (4.7 cm dilatation at the sinus of Valsalva, 3.5 at the sinotubular junction and 4.5 in the descending aorta). She continues to smoke. 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 Followed also by [facility] for back issues.   Other issues as noted below.   Last seen here in September of 2015. Referred for cardiac cath after echo showed reduced EF. Results noted below.   Comes in today. Here alone. She is wanting some type of electrical stimulator for her back pain/issues. She is mostly limited by her back issues. She has a very poor understanding of her cardiac situation. Tells me that she cannot take beta blockers. Unclear as to why. Always short of breath. Some swelling. Restricts her salt. No chest pain. No passing out. Not interested in smoking cessation. Was referred to pulmonary with PFTs - Carol Warren - she WILL NOT see him Warren to past bad experience with him.    Current Outpatient Prescriptions  Medication Sig Dispense Refill  . albuterol (PROVENTIL) (2.5 MG/3ML) 0.083% nebulizer solution Take 2.5 mg by nebulization 2 (two) times daily as needed for wheezing.      . AMBULATORY NON FORMULARY MEDICATION Topical nasal rinse/ Tobram Budesonide Use nasal as needed      . amoxicillin-clavulanate (AUGMENTIN) 875-125 MG per tablet Take 1 tablet by mouth 2 (two) times daily. For 45 days      . B Complex Vitamins (VITAMIN B COMPLEX PO) Take 1 tablet by mouth daily.      [name] buPROPion (WELLBUTRIN XL) 300 MG 24 hr tablet Take 300 mg by mouth daily.      . Calcium Carbonate-Vitamin D (CALCIUM-D PO) Take 1 tablet by mouth  daily.       . cyanocobalamin (,VITAMIN B-12,) 1000 MCG/ML injection Inject 1,000 mcg into the muscle once a week.      . diazepam (VALIUM) 10 MG tablet Take 10 mg by mouth 3 (three) times daily as needed for anxiety.      . fluticasone (FLONASE) 50 MCG/ACT nasal spray Place 1 spray into the nose 2 (two) times daily.      . Fluticasone-Salmeterol (ADVAIR) 250-50 MCG/DOSE AEPB Inhale 1 puff into the lungs 2 (two) times daily.      . furosemide (LASIX) 20 MG tablet Take 0.5 tablets (10 mg total) by mouth daily.  30 tablet  2  . HYDROcodone-acetaminophen (NORCO/VICODIN) 5-325 MG per tablet Take 0.5 tablets by mouth every 4 (four) hours as needed for moderate pain.       [name] ibuprofen (ADVIL,MOTRIN) 200 MG tablet Take 400 mg by mouth every 6 (six) hours as needed for headache.       . loratadine (CLARITIN) 10 MG tablet Take 10 mg by mouth daily.      [name] losartan (COZAAR) 50 MG tablet Take 1 tablet (50 mg total) by mouth daily.  30 tablet  6  . meloxicam (MOBIC) 15 MG tablet Take 15 mg by mouth daily as needed for pain.      . Menthol, Topical Analgesic, (ICY HOT EX) Apply 1 patch topically daily.      [name]  montelukast (SINGULAIR) 10 MG tablet Take 10 mg by mouth at bedtime.      . Multiple Vitamin (MULTIVITAMIN) capsule Take 1 capsule by mouth daily.        . Olopatadine HCl (PATANASE) 0.6 % SOLN Place 2 puffs into both nostrils 2 (two) times daily.      [name] omeprazole (PRILOSEC) 20 MG capsule Take 40 mg by mouth daily.      . Polyvinyl Alcohol-Povidone (REFRESH OP) Place 2 drops into both eyes 2 (two) times daily.      . Probiotic Product (PROBIOTIC DAILY PO) Take 1 capsule by mouth daily. Ultimate flora      . pseudoephedrine-guaifenesin (MUCINEX D) 60-600 MG per tablet Take 1 tablet by mouth as needed for congestion.      . ranitidine (ZANTAC) 300 MG capsule Take 300 mg by mouth every evening.      . Saline (SIMPLY SALINE) 0.9 % AERS Place 2 sprays into both nostrils 2 (two) times daily.      . traZODone  (DESYREL) 50 MG tablet Take 50 mg by mouth at bedtime.       No current facility-administered medications for this visit.    Allergies  Allergen Reactions  . Oxycodone Nausea Only    Past Medical History  Diagnosis Date  . 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 Osteopenia   . HSV-1 (herpes simplex virus 1) infection   . HSV-2 (herpes simplex virus 2) infection   . Bicuspid aortic valve     CONGENITAL  . Aortic aneurysm     CARDIOLOGIST IS Carol Warren IN [place]  . Adrenal tumor   . Degenerative disc disease     CERVICAL AND LUMBAR (Carol Warren)  . Smoker   . Anxiety   . Chronic sinusitis   . Chronic depression   . Osteoarthritis   . Carpal tunnel syndrome, bilateral   . Pernicious anemia   . Insomnia   . Asthma   . Pericardial cyst   . Chronic fatigue   . Vitamin B 12 deficiency   . Plantar fasciitis   . Hammer toe of right foot   . Hepatomegaly   . Rosacea   . Esophagitis   . Gastritis     Past Surgical History  Procedure Laterality Date  . Excision of vulvar cis    . Sinus procedure  2009    x 6  . Excision of basal cell ca      SKIN   . Cataract extraction Bilateral     X2  . Carpal tunnel release Bilateral 2008  . Bunionectomy with hammertoe reconstruction Right   . Colonoscopy    . Upper gastrointestinal endoscopy      History  Smoking status  . Current Every Day Smoker -- 1.00 packs/day for 50 years  . Types: Cigarettes  Smokeless tobacco  . Never Used    Comment: uses vapor cig    History  Alcohol Use No    Family History  Problem Relation Age of Onset  . Pancreatic cancer Mother 65  . Diabetes Father   . Hypertension Father   . Heart disease Father 69    CAD  . Breast cancer Sister   . Diabetes Maternal Grandmother   . Hypertension Maternal Grandmother   . Cancer Maternal Grandfather     colon or stomach  . Hypertension Paternal Grandmother   . Heart disease Paternal Grandmother     Later onset  . Asthma  Grandchild     . Diverticulosis Paternal Grandmother     Review of Systems: The review of systems is per the HPI.  All other systems were reviewed and are negative.  Physical Exam: BP 130/86  Pulse 100  Ht 5\' 1"  (1.549 m)  Wt 116 lb 12.8 oz (52.98 kg)  BMI 22.08 kg/m2 Patient is very pleasant and in no acute distress. Skin is warm and dry. Color is normal.  HEENT is unremarkable. Normocephalic/atraumatic. PERRL. Sclera are nonicteric. Neck is supple. No masses. No JVD. Lungs are clear. Cardiac exam shows a regular rate and rhythm. Outflow murmur noted along with a S3. She is tachycardic. Abdomen is soft. Extremities are with trace edema. Gait and ROM are intact. No gross neurologic deficits noted.  Wt Readings from Last 3 Encounters:  10/03/14 116 lb 12.8 oz (52.98 kg)  09/23/14 115 lb (52.164 kg)  09/23/14 115 lb (52.164 kg)    LABORATORY DATA/PROCEDURES:  Lab Results  Component Value Date   WBC 7.6 09/23/2014   HGB 13.0 09/23/2014   HCT 37.8 09/23/2014   PLT 329 09/23/2014   GLUCOSE 123* 09/23/2014   NA 141 09/23/2014   K 4.0 09/23/2014   CL 102 09/23/2014   CREATININE 0.81 09/23/2014   BUN 12 09/23/2014   CO2 25 09/23/2014   INR 1.04 09/23/2014    BNP (last 3 results) No results found for this basename: PROBNP,  in the last 8760 hours Procedure: Right Heart Cath, Left Heart Cath, Selective Coronary Angiography, LV angiography  Indication: Cardiomyopathy with an EF newly found to be 35% and anterior hypokinesis, AS/AI  Procedural Details: The right groin was prepped, draped, and anesthetized with 1% lidocaine. Using the modified Seldinger technique a 5 French sheath was placed in the right femoral artery and a 7 French sheath was placed in the right femoral vein. A Swan-Ganz catheter was used for the right heart catheterization. Standard protocol was followed for recording of right heart pressures and sampling of oxygen saturations. Fick cardiac output was calculated. Standard Judkins catheters  were used for selective coronary angiography and left ventriculography. There were no immediate procedural complications. The patient was transferred to the post catheterization recovery area for further monitoring.  Procedural Findings:  Hemodynamics:  RA 15  RV 57/17  PA 59/28 (45)  PCWP Mean 36  LV 157/22 EDP 32  AO 148/88  AoV Gradient mean  AoV Area  Oxygen saturations:  PA 53%  AO 90%  Cardiac Output (Fick) 3.03 Cardiac Index (Fick) 2.03  Coronary angiography:  Coronary dominance: right  Left mainstem: Normal  Left anterior descending (LAD): Wraps the apex. Mild mid 25% stenosis. D1 is large with luminal irregularities.  Left circumflex (LCx): AV groove proximal 30% after large MOM. MOM large branching and normal. OM2 moderate sized and normal.  Right coronary artery (RCA): High anterior take up best approached with an Amplatz left catheter  Left ventriculography: Left ventricle was not injected secondary to high EDP. I did cross the valve for pressures. Despite significant difficulty crossing the valve there was not a high measured gradient.  AO Root: Mild AI. There did appear to be aortic root dilatation given the small size of the patient.  Final Conclusions: Mild coronary plaque. LV gradient mild but might be underestimated given estimate of LV function on echo. Mild AI by cath. Elevated EDP and significantly elevated pulmonary pressures. O2 sats were low during the procedure.  Recommendations: I will order PFTs. She will need to see  pulmonary. She needs to stop smoking. I will consider a TEE after this to further evaluate her valve. She would benefit from diuresis and salt restriction.  Minus Breeding  09/23/2014, 11:15 AM   Echo Study Conclusions from September 2015  - Left ventricle: The cavity size was normal. Wall thickness was normal. Systolic function was moderately to severely reduced. The estimated ejection fraction was in the range of 30% to 35%. There is akinesis  of the anteroseptal myocardium. Features are consistent with a pseudonormal left ventricular filling pattern, with concomitant abnormal relaxation and increased filling pressure (grade 2 diastolic dysfunction). - Aortic valve: Bicuspid; moderately thickened, moderately calcified leaflets. Cusp separation was reduced. There was moderate stenosis. There was moderate regurgitation. Peak velocity (S): 302 cm/s. Mean gradient (S): 24 mm Hg. Aortic stenosis may be underestimated as a result of decreased ejection fraction. - Aorta: The aorta was moderately calcified. Aortic root dimension: 38 mm (ED). - Ascending aorta: The ascending aorta was mildly dilated. - Mitral valve: Calcified annulus. Mildly thickened leaflets . There was mild to moderate regurgitation. - Pulmonary arteries: Systolic pressure was mildly increased. PA peak pressure: 44 mm Hg (S).    Assessment / Plan:  1. 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 Enlarged aorta  3. NICM - I have increased her Lasix to 20 mg a day. She is restricting her salt. Does not sound like she will take beta blockers. She is on ARB therapy. Overall situation quite tenuous. Needs to get back to see Carol Warren for further discussion and disposition. Recheck lab today to include BNP and BMET  4. Back pain - this is her most limiting factor.   5. Tobacco abuse - not ready to stop.   Will try to get her pulmonary consult changed. Arrange for TEE. Check labs today. See Carol Warren in 2 to 3 weeks for further discussion.   Pent is agreeable to this plan and will call if any problems develop in the interim.   Burtis Junes, RN, [place] [address] [place], [place]  [phone]

## 2014-10-05 ENCOUNTER — Telehealth: Payer: Self-pay | Admitting: Cardiology

## 2014-10-05 NOTE — Telephone Encounter (Signed)
New message          Can pt take allergy medication before TEE in the morning / she has several / can she take diazepam 10 mg?

## 2014-10-06 ENCOUNTER — Encounter (HOSPITAL_COMMUNITY): Disposition: A | Discharge status: Home / Self Care | Payer: Self-pay | Source: Ambulatory Visit | Attending: Cardiology

## 2014-10-06 ENCOUNTER — Ambulatory Visit (HOSPITAL_COMMUNITY)
Admit: 2014-10-06 | Discharge: 2014-10-06 | Disposition: A | Discharge status: Home / Self Care | Payer: Medicare Other | Source: Ambulatory Visit | Attending: Cardiology | Admitting: Cardiology

## 2014-10-06 DIAGNOSIS — I429 Cardiomyopathy, unspecified: Secondary | ICD-10-CM | POA: Diagnosis present

## 2014-10-06 DIAGNOSIS — I350 Nonrheumatic aortic (valve) stenosis: Secondary | ICD-10-CM | POA: Insufficient documentation

## 2014-10-06 DIAGNOSIS — Z86008 Personal history of in-situ neoplasm of other site: Secondary | ICD-10-CM | POA: Diagnosis not present

## 2014-10-06 DIAGNOSIS — B009 Herpesviral infection, unspecified: Secondary | ICD-10-CM | POA: Insufficient documentation

## 2014-10-06 DIAGNOSIS — Z538 Procedure and treatment not carried out for other reasons: Secondary | ICD-10-CM | POA: Insufficient documentation

## 2014-10-06 DIAGNOSIS — I2510 Atherosclerotic heart disease of native coronary artery without angina pectoris: Secondary | ICD-10-CM | POA: Diagnosis not present

## 2014-10-06 DIAGNOSIS — M8580 Other specified disorders of bone density and structure, unspecified site: Secondary | ICD-10-CM | POA: Insufficient documentation

## 2014-10-06 DIAGNOSIS — I719 Aortic aneurysm of unspecified site, without rupture: Secondary | ICD-10-CM | POA: Insufficient documentation

## 2014-10-06 DIAGNOSIS — Z720 Tobacco use: Secondary | ICD-10-CM | POA: Insufficient documentation

## 2014-10-06 HISTORY — PX: TEE WITHOUT CARDIOVERSION: SHX5443

## 2014-10-06 SURGERY — ECHOCARDIOGRAM, TRANSESOPHAGEAL
Anesthesia: Moderate Sedation

## 2014-10-06 MED ORDER — FENTANYL CITRATE 0.05 MG/ML IJ SOLN
INTRAMUSCULAR | Status: DC | PRN
Start: 2014-10-06 — End: 2014-10-06
  Administered 2014-10-06 (×2): 25 ug via INTRAVENOUS
  Administered 2014-10-06: 12.5 ug via INTRAVENOUS

## 2014-10-06 MED ORDER — FENTANYL CITRATE 0.05 MG/ML IJ SOLN
INTRAMUSCULAR | Status: AC
  Filled 2014-10-06: qty 2

## 2014-10-06 MED ORDER — LIDOCAINE VISCOUS 2 % MT SOLN
OROMUCOSAL | Status: DC | PRN
  Administered 2014-10-06: 12 mL via OROMUCOSAL

## 2014-10-06 MED ORDER — MIDAZOLAM HCL 10 MG/2ML IJ SOLN
INTRAMUSCULAR | Status: DC | PRN
  Administered 2014-10-06: 1 mg via INTRAVENOUS
  Administered 2014-10-06 (×2): 2 mg via INTRAVENOUS
  Administered 2014-10-06: 1 mg via INTRAVENOUS

## 2014-10-06 MED ORDER — MIDAZOLAM HCL 5 MG/ML IJ SOLN
INTRAMUSCULAR | Status: AC
  Filled 2014-10-06: qty 2

## 2014-10-06 MED ORDER — LIDOCAINE VISCOUS 2 % MT SOLN
OROMUCOSAL | Status: AC
  Filled 2014-10-06: qty 15

## 2014-10-06 MED ORDER — DIPHENHYDRAMINE HCL 50 MG/ML IJ SOLN
INTRAMUSCULAR | Status: AC
  Filled 2014-10-06: qty 1

## 2014-10-06 MED ORDER — BUTAMBEN-TETRACAINE-BENZOCAINE 2-2-14 % EX AERO
INHALATION_SPRAY | CUTANEOUS | Status: DC | PRN
  Administered 2014-10-06: 2 via TOPICAL

## 2014-10-06 MED ORDER — MIDAZOLAM HCL 5 MG/ML IJ SOLN
INTRAMUSCULAR | Status: AC
  Filled 2014-10-06: qty 1

## 2014-10-06 MED ORDER — DIPHENHYDRAMINE HCL 50 MG/ML IJ SOLN
INTRAMUSCULAR | Status: DC | PRN
  Administered 2014-10-06 (×2): 25 mg via INTRAVENOUS

## 2014-10-06 MED ORDER — SODIUM CHLORIDE 0.9 % IV SOLN
INTRAVENOUS | Status: DC
  Administered 2014-10-06: 100 mL/h via INTRAVENOUS

## 2014-10-06 NOTE — Discharge Instructions (Signed)
Conscious Sedation, Adult, Care After Refer to this sheet in the next few weeks. These instructions provide you with information on caring for yourself after your procedure. Your health care provider may also give you more specific instructions. Your treatment has been planned according to current medical practices, but problems sometimes occur. Call your health care provider if you have any problems or questions after your procedure. WHAT TO EXPECT AFTER THE PROCEDURE  After your procedure:  You may feel sleepy, clumsy, and have poor balance for several hours.  Vomiting may occur if you eat too soon after the procedure. HOME CARE INSTRUCTIONS  Do not participate in any activities where you could become injured for at least 24 hours. Do not:  Drive.  Swim.  Ride a bicycle.  Operate heavy machinery.  Cook.  Use power tools.  Climb ladders.  Work from a high place.  Do not make important decisions or sign legal documents until you are improved.  If you vomit, drink water, juice, or soup when you can drink without vomiting. Make sure you have little or no nausea before eating solid foods.  Only take over-the-counter or prescription medicines for pain, discomfort, or fever as directed by your health care provider.  Make sure you and your family fully understand everything about the medicines given to you, including what side effects may occur.  You should not drink alcohol, take sleeping pills, or take medicines that cause drowsiness for at least 24 hours.  If you smoke, do not smoke without supervision.  If you are feeling better, you may resume normal activities 24 hours after you were sedated.  Keep all appointments with your health care provider. SEEK MEDICAL CARE IF:  Your skin is pale or bluish in color.  You continue to feel nauseous or vomit.  Your pain is getting worse and is not helped by medicine.  You have bleeding or swelling.  You are still sleepy or  feeling clumsy after 24 hours. SEEK IMMEDIATE MEDICAL CARE IF:  You develop a rash.  You have difficulty breathing.  You develop any type of allergic problem.  You have a fever. MAKE SURE YOU:  Understand these instructions.  Will watch your condition.  Will get help right away if you are not doing well or get worse. Document Released: 10/06/2013 Document Reviewed: 10/06/2013 [facility] Patient Information 2015 [place], [place]. This information is not intended to replace advice given to you by your health care provider. Make sure you discuss any questions you have with your health care provider. Transesophageal Echocardiogram Transesophageal echocardiography (TEE) is a picture test of your heart using sound waves. The pictures taken can give very detailed pictures of your heart. This can help your doctor see if there are problems with your heart. TEE can check:  If your heart has blood clots in it.  How well your heart valves are working.  If you have an infection on the inside of your heart.  Some of the major arteries of your heart.  If your heart valve is working after a Office manager.  Your heart before a procedure that uses a shock to your heart to get the rhythm back to normal. BEFORE THE PROCEDURE  Do not eat or drink for 6 hours before the procedure or as told by your doctor.  Make plans to have someone drive you home after the procedure. Do not drive yourself home.  An IV tube will be put in your arm. PROCEDURE  You will be given a  medicine to help you relax (sedative). It will be given through the IV tube.  A numbing medicine will be sprayed or gargled in the back of your throat to help numb it.  The tip of the probe is placed into the back of your mouth. You will be asked to swallow. This helps to pass the probe into your esophagus.  Once the tip of the probe is in the right place, your doctor can take pictures of your heart.  You may feel pressure at the back of  your throat. AFTER THE PROCEDURE  You will be taken to a recovery area so the sedative can wear off.  Your throat may be sore and scratchy. This will go away slowly over time.  You will go home when you are fully awake and able to swallow liquids.  You should have someone stay with you for the next 24 hours.  Do not drive or operate machinery for the next 24 hours. Document Released: 10/13/2009 Document Revised: 12/21/2013 Document Reviewed: 06/17/2013 [facility] Patient Information 2015 [place], [place]. This information is not intended to replace advice given to you by your health care provider. Make sure you discuss any questions you have with your health care provider.

## 2014-10-06 NOTE — Telephone Encounter (Signed)
Per Dr Stanford Breed pt . May take valium and allergy med

## 2014-10-06 NOTE — Interval H&P Note (Signed)
History and Physical Interval Note:  10/06/2014 11:11 AM  Carol Warren  has presented today for surgery, with the diagnosis of aortic valve disease  The various methods of treatment have been discussed with the patient and family. After consideration of risks, benefits and other options for treatment, the patient has consented to  Procedure(s): TRANSESOPHAGEAL ECHOCARDIOGRAM (TEE) (N/A) as a surgical intervention .  The patient's history has been reviewed, patient examined, no change in status, stable for surgery.  I have reviewed the patient's chart and labs.  Questions were answered to the patient's satisfaction.     TURNER,[name]

## 2014-10-06 NOTE — H&P (View-Only) (Signed)
Carol Warren Date of Birth: April 19, 1946 Medical Record #[id number]  History of Present Illness: Ms. Carol Warren is seen back today for a post hospital visit. Seen for Dr. Percival Warren. She is a 68 year old female with AS. Previously followed by Carol Warren. ?of bicuspid valve. Other issues include dilated aorta (4.7 cm dilatation at the sinus of Valsalva, 3.5 at the sinotubular junction and 4.5 in the descending aorta). She continues to smoke. 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 Laqueta Warren in [facility] follows -  Her PCP- Carol Warren. Followed also by Carol Warren for back issues.   Other issues as noted below.   Last seen here in September of 2015. Referred for cardiac cath after echo showed reduced EF. Results noted below.   Comes in today. Here alone. She is wanting some type of electrical stimulator for her back pain/issues. She is mostly limited by her back issues. She has a very poor understanding of her cardiac situation. Tells me that she cannot take beta blockers. Unclear as to why. Always short of breath. Some swelling. Restricts her salt. No chest pain. No passing out. Not interested in smoking cessation. Was referred to pulmonary with PFTs - Dr. Melvyn Warren - she WILL NOT see him Warren to past bad experience with him.    Current Outpatient Prescriptions  Medication Sig Dispense Refill  . albuterol (PROVENTIL) (2.5 MG/3ML) 0.083% nebulizer solution Take 2.5 mg by nebulization 2 (two) times daily as needed for wheezing.      . AMBULATORY NON FORMULARY MEDICATION Topical nasal rinse/ Tobram Budesonide Use nasal as needed      . amoxicillin-clavulanate (AUGMENTIN) 875-125 MG per tablet Take 1 tablet by mouth 2 (two) times daily. For 45 days      . B Complex Vitamins (VITAMIN B COMPLEX PO) Take 1 tablet by mouth daily.      [name] buPROPion (WELLBUTRIN XL) 300 MG 24 hr tablet Take 300 mg by mouth daily.      . Calcium Carbonate-Vitamin D (CALCIUM-D PO) Take 1 tablet by mouth  daily.       . cyanocobalamin (,VITAMIN B-12,) 1000 MCG/ML injection Inject 1,000 mcg into the muscle once a week.      . diazepam (VALIUM) 10 MG tablet Take 10 mg by mouth 3 (three) times daily as needed for anxiety.      . fluticasone (FLONASE) 50 MCG/ACT nasal spray Place 1 spray into the nose 2 (two) times daily.      . Fluticasone-Salmeterol (ADVAIR) 250-50 MCG/DOSE AEPB Inhale 1 puff into the lungs 2 (two) times daily.      . furosemide (LASIX) 20 MG tablet Take 0.5 tablets (10 mg total) by mouth daily.  30 tablet  2  . HYDROcodone-acetaminophen (NORCO/VICODIN) 5-325 MG per tablet Take 0.5 tablets by mouth every 4 (four) hours as needed for moderate pain.       [name] ibuprofen (ADVIL,MOTRIN) 200 MG tablet Take 400 mg by mouth every 6 (six) hours as needed for headache.       . loratadine (CLARITIN) 10 MG tablet Take 10 mg by mouth daily.      [name] losartan (COZAAR) 50 MG tablet Take 1 tablet (50 mg total) by mouth daily.  30 tablet  6  . meloxicam (MOBIC) 15 MG tablet Take 15 mg by mouth daily as needed for pain.      . Menthol, Topical Analgesic, (ICY HOT EX) Apply 1 patch topically daily.      [name]  montelukast (SINGULAIR) 10 MG tablet Take 10 mg by mouth at bedtime.      . Multiple Vitamin (MULTIVITAMIN) capsule Take 1 capsule by mouth daily.        . Olopatadine HCl (PATANASE) 0.6 % SOLN Place 2 puffs into both nostrils 2 (two) times daily.      [name] omeprazole (PRILOSEC) 20 MG capsule Take 40 mg by mouth daily.      . Polyvinyl Alcohol-Povidone (REFRESH OP) Place 2 drops into both eyes 2 (two) times daily.      . Probiotic Product (PROBIOTIC DAILY PO) Take 1 capsule by mouth daily. Ultimate flora      . pseudoephedrine-guaifenesin (MUCINEX D) 60-600 MG per tablet Take 1 tablet by mouth as needed for congestion.      . ranitidine (ZANTAC) 300 MG capsule Take 300 mg by mouth every evening.      . Saline (SIMPLY SALINE) 0.9 % AERS Place 2 sprays into both nostrils 2 (two) times daily.      . traZODone  (DESYREL) 50 MG tablet Take 50 mg by mouth at bedtime.       No current facility-administered medications for this visit.    Allergies  Allergen Reactions  . Oxycodone Nausea Only    Past Medical History  Diagnosis Date  . 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 Osteopenia   . HSV-1 (herpes simplex virus 1) infection   . HSV-2 (herpes simplex virus 2) infection   . Bicuspid aortic valve     CONGENITAL  . Aortic aneurysm     CARDIOLOGIST IS Carol Warren IN [place]  . Adrenal tumor   . Degenerative disc disease     CERVICAL AND LUMBAR (DR. Lorin Warren)  . Smoker   . Anxiety   . Chronic sinusitis   . Chronic depression   . Osteoarthritis   . Carpal tunnel syndrome, bilateral   . Pernicious anemia   . Insomnia   . Asthma   . Pericardial cyst   . Chronic fatigue   . Vitamin B 12 deficiency   . Plantar fasciitis   . Hammer toe of right foot   . Hepatomegaly   . Rosacea   . Esophagitis   . Gastritis     Past Surgical History  Procedure Laterality Date  . Excision of vulvar cis    . Sinus procedure  2009    x 6  . Excision of basal cell ca      SKIN   . Cataract extraction Bilateral     X2  . Carpal tunnel release Bilateral 2008  . Bunionectomy with hammertoe reconstruction Right   . Colonoscopy    . Upper gastrointestinal endoscopy      History  Smoking status  . Current Every Day Smoker -- 1.00 packs/day for 50 years  . Types: Cigarettes  Smokeless tobacco  . Never Used    Comment: uses vapor cig    History  Alcohol Use No    Family History  Problem Relation Age of Onset  . Pancreatic cancer Mother 28  . Diabetes Father   . Hypertension Father   . Heart disease Father 3    CAD  . Breast cancer Sister   . Diabetes Maternal Grandmother   . Hypertension Maternal Grandmother   . Cancer Maternal Grandfather     colon or stomach  . Hypertension Paternal Grandmother   . Heart disease Paternal Grandmother     Later onset  . Asthma  Grandchild     . Diverticulosis Paternal Grandmother     Review of Systems: The review of systems is per the HPI.  All other systems were reviewed and are negative.  Physical Exam: BP 130/86  Pulse 100  Ht 5\' 1"  (1.549 m)  Wt 116 lb 12.8 oz (52.98 kg)  BMI 22.08 kg/m2 Patient is very pleasant and in no acute distress. Skin is warm and dry. Color is normal.  HEENT is unremarkable. Normocephalic/atraumatic. PERRL. Sclera are nonicteric. Neck is supple. No masses. No JVD. Lungs are clear. Cardiac exam shows a regular rate and rhythm. Outflow murmur noted along with a S3. She is tachycardic. Abdomen is soft. Extremities are with trace edema. Gait and ROM are intact. No gross neurologic deficits noted.  Wt Readings from Last 3 Encounters:  10/03/14 116 lb 12.8 oz (52.98 kg)  09/23/14 115 lb (52.164 kg)  09/23/14 115 lb (52.164 kg)    LABORATORY DATA/PROCEDURES:  Lab Results  Component Value Date   WBC 7.6 09/23/2014   HGB 13.0 09/23/2014   HCT 37.8 09/23/2014   PLT 329 09/23/2014   GLUCOSE 123* 09/23/2014   NA 141 09/23/2014   K 4.0 09/23/2014   CL 102 09/23/2014   CREATININE 0.81 09/23/2014   BUN 12 09/23/2014   CO2 25 09/23/2014   INR 1.04 09/23/2014    BNP (last 3 results) No results found for this basename: PROBNP,  in the last 8760 hours Procedure: Right Heart Cath, Left Heart Cath, Selective Coronary Angiography, LV angiography  Indication: Cardiomyopathy with an EF newly found to be 35% and anterior hypokinesis, AS/AI  Procedural Details: The right groin was prepped, draped, and anesthetized with 1% lidocaine. Using the modified Seldinger technique a 5 French sheath was placed in the right femoral artery and a 7 French sheath was placed in the right femoral vein. A Swan-Ganz catheter was used for the right heart catheterization. Standard protocol was followed for recording of right heart pressures and sampling of oxygen saturations. Fick cardiac output was calculated. Standard Judkins catheters  were used for selective coronary angiography and left ventriculography. There were no immediate procedural complications. The patient was transferred to the post catheterization recovery area for further monitoring.  Procedural Findings:  Hemodynamics:  RA 15  RV 57/17  PA 59/28 (45)  PCWP Mean 36  LV 157/22 EDP 32  AO 148/88  AoV Gradient mean  AoV Area  Oxygen saturations:  PA 53%  AO 90%  Cardiac Output (Fick) 3.03 Cardiac Index (Fick) 2.03  Coronary angiography:  Coronary dominance: right  Left mainstem: Normal  Left anterior descending (LAD): Wraps the apex. Mild mid 25% stenosis. D1 is large with luminal irregularities.  Left circumflex (LCx): AV groove proximal 30% after large MOM. MOM large branching and normal. OM2 moderate sized and normal.  Right coronary artery (RCA): High anterior take up best approached with an Amplatz left catheter  Left ventriculography: Left ventricle was not injected secondary to high EDP. I did cross the valve for pressures. Despite significant difficulty crossing the valve there was not a high measured gradient.  AO Root: Mild AI. There did appear to be aortic root dilatation given the small size of the patient.  Final Conclusions: Mild coronary plaque. LV gradient mild but might be underestimated given estimate of LV function on echo. Mild AI by cath. Elevated EDP and significantly elevated pulmonary pressures. O2 sats were low during the procedure.  Recommendations: I will order PFTs. She will need to see  pulmonary. She needs to stop smoking. I will consider a TEE after this to further evaluate her valve. She would benefit from diuresis and salt restriction.  Minus Breeding  09/23/2014, 11:15 AM   Echo Study Conclusions from September 2015  - Left ventricle: The cavity size was normal. Wall thickness was normal. Systolic function was moderately to severely reduced. The estimated ejection fraction was in the range of 30% to 35%. There is akinesis  of the anteroseptal myocardium. Features are consistent with a pseudonormal left ventricular filling pattern, with concomitant abnormal relaxation and increased filling pressure (grade 2 diastolic dysfunction). - Aortic valve: Bicuspid; moderately thickened, moderately calcified leaflets. Cusp separation was reduced. There was moderate stenosis. There was moderate regurgitation. Peak velocity (S): 302 cm/s. Mean gradient (S): 24 mm Hg. Aortic stenosis may be underestimated as a result of decreased ejection fraction. - Aorta: The aorta was moderately calcified. Aortic root dimension: 38 mm (ED). - Ascending aorta: The ascending aorta was mildly dilated. - Mitral valve: Calcified annulus. Mildly thickened leaflets . There was mild to moderate regurgitation. - Pulmonary arteries: Systolic pressure was mildly increased. PA peak pressure: 44 mm Hg (S).    Assessment / Plan:  1. 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 Enlarged aorta  3. NICM - I have increased her Lasix to 20 mg a day. She is restricting her salt. Does not sound like she will take beta blockers. She is on ARB therapy. Overall situation quite tenuous. Needs to get back to see Dr. Percival Warren for further discussion and disposition. Recheck lab today to include BNP and BMET  4. Back pain - this is her most limiting factor.   5. Tobacco abuse - not ready to stop.   Will try to get her pulmonary consult changed. Arrange for TEE. Check labs today. See Dr. Percival Warren in 2 to 3 weeks for further discussion.   Pent is agreeable to this plan and will call if any problems develop in the interim.   Burtis Junes, RN, [facility] [address] [place], [place]  [phone]

## 2014-10-06 NOTE — CV Procedure (Signed)
   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  The patient's posterior oropharynx is very narrow and unable to pass the probe into her esophagus.   The patient was transferred back to her room in stable condition.

## 2014-10-07 ENCOUNTER — Telehealth: Payer: Self-pay | Admitting: Nurse Practitioner

## 2014-10-07 ENCOUNTER — Encounter (HOSPITAL_COMMUNITY): Payer: Self-pay | Admitting: Cardiology

## 2014-10-07 ENCOUNTER — Telehealth: Payer: Self-pay | Admitting: Internal Medicine

## 2014-10-07 NOTE — Telephone Encounter (Signed)
Pt has decided to keep PFT on 10/19 at 12:00 PM & Con w/ MW on 10/19 at 1:30 PM.  Please do not reschedule these appts.  Thanks!  Satira Anis

## 2014-10-07 NOTE — Telephone Encounter (Signed)
New message      Calling to give Carol Warren an update on medication

## 2014-10-07 NOTE — Progress Notes (Signed)
Have discussed with Dr. Percival Spanish today on 10/07/14 via phone.  Dr. Percival Spanish is aware that the probe could not be passed. He does not feel further heart imaging is needed at this time but needs to get to pulmonary first. We have her on some low dose heart medicines which we will continue.   He wants to get her on to pulmonary.   Will try to get her pulmonary evaluation changed from Dr. Melvyn Novas (had bad experience in the past) to one of the other physicians.   Burtis Junes, RN, [facility] [address] [place], [place]  [phone]

## 2014-10-07 NOTE — Telephone Encounter (Signed)
S/w pt know's that [facility] s/w Dr. Percival Spanish.  Stated heart wise nothing to do right now.  Next step is pulmonology. Pt has appointment for PFT'S and consulation with Dr. Melvyn Novas on October 19 th . Pt agreeable with plan and wants to get her back fixed will do anything.

## 2014-10-12 ENCOUNTER — Telehealth: Payer: Self-pay | Admitting: Cardiology

## 2014-10-12 NOTE — Telephone Encounter (Signed)
Returned call to patient. Patient reports chest congestion, sinus congestion and chest tightness of left side related to congestion. She said that every time the seasons change she develops these symptoms and then gets sinusitis and bronchitis. She reports she has a low grade fever, which is not unusual for her chronic sinusitis issues. She complains of back pain and said she cannot drive to her PCP for an eval but could get to urgent care, which was encouraged. Patient had a fairly normal cath by Dr. Percival Spanish, attempted TEE (non successful) and is ordered PFTs and appmt with Dr. Melvyn Novas on Monday 10/19. Patient describes her symptoms as in line with her seasonal congestion issues and does NOT describe chest pain. She was very sniffly and coughing while on phone. She will attempt to obtain previous PFT done at [facility] to take to Dr. Melvyn Novas on Monday. She will also see PCP or urgent care tomorrow.   Will inform Dr. Percival Spanish as Juluis Rainier

## 2014-10-12 NOTE — Telephone Encounter (Signed)
Pt says she is really congested,weak and left side of her chest is tight.She has an appt on Monday with Dr Melvyn Novas. What should she do?

## 2014-10-17 ENCOUNTER — Institutional Professional Consult (permissible substitution): Payer: Medicare Other | Admitting: Internal Medicine

## 2014-10-17 ENCOUNTER — Telehealth: Payer: Self-pay | Admitting: Cardiology

## 2014-10-17 NOTE — Telephone Encounter (Signed)
Pt called in wanting to know if it is necessary for her to see the pulmanologist before coming to see Dr. Percival Spanish based on the test that needs to be ran. Please call  thanks

## 2014-10-17 NOTE — Telephone Encounter (Signed)
Returned call to patient she stated she needed to reschedule appointment with Dr.Hochrein.Stated pulmonary appointment rescheduled to 11/09/14.Appointment scheduled with Dr.Hochrein 12/09/14 at 10:30 am.Stated she wanted to ask Dr.Hochrein if ok for her to have a internal back stimulator.Stated pain management Dr.Dr.Newton at [facility] will be placing stimulator in for a trail of 7 days to see if helps her back pain.Message sent to Stella for advice.

## 2014-10-22 NOTE — Telephone Encounter (Signed)
I would like to see her back before OKing back surgery.  She has a very tenuous respiratory status

## 2014-10-24 ENCOUNTER — Telehealth: Payer: Self-pay | Admitting: Cardiology

## 2014-10-24 NOTE — Telephone Encounter (Signed)
Carol Warren is calling because she is not quite understanding why she need to come in on tomorrow . Please call    Thanks

## 2014-10-24 NOTE — Telephone Encounter (Signed)
Pt. To be seen 10/25/14

## 2014-10-24 NOTE — Telephone Encounter (Signed)
PATIENT STATES SHE IS SICK AT PRESENT TIME SHE HAS AN APPOINTMENT WITH DR Melvyn Novas 11/09/14 SHE WANTS HER APPOINTMENT WITH DR [facility] SOON THERE AFTER  NOT IN [place] 2015.  RN CANCELLED APPOINTMENT FOR 11/25/14 12/09/14 RESCHEDULE TO 11/14/14 PATIENT AWARE AND SATISFIED.

## 2014-10-25 ENCOUNTER — Ambulatory Visit: Payer: Medicare Other | Admitting: Cardiology

## 2014-10-31 ENCOUNTER — Encounter (HOSPITAL_COMMUNITY): Payer: Self-pay | Admitting: Cardiology

## 2014-10-31 ENCOUNTER — Telehealth: Payer: Self-pay | Admitting: Cardiology

## 2014-10-31 NOTE — Telephone Encounter (Signed)
Patient wants to start taking nicoderm CQ and the package states to call cardiologist. She and her friends are trying to quit smoking together.   OK to use?

## 2014-10-31 NOTE — Telephone Encounter (Signed)
Please call,question about whether she can take nicotine.

## 2014-11-01 ENCOUNTER — Ambulatory Visit: Payer: Medicare Other | Admitting: Cardiology

## 2014-11-01 NOTE — Telephone Encounter (Signed)
Yes.  OK to use nicotine patches.

## 2014-11-09 ENCOUNTER — Encounter: Payer: Self-pay | Admitting: Internal Medicine

## 2014-11-09 ENCOUNTER — Ambulatory Visit (INDEPENDENT_AMBULATORY_CARE_PROVIDER_SITE_OTHER): Payer: Medicare Other | Admitting: Internal Medicine

## 2014-11-09 ENCOUNTER — Ambulatory Visit (INDEPENDENT_AMBULATORY_CARE_PROVIDER_SITE_OTHER)
Admission: RE | Admit: 2014-11-09 | Discharge: 2014-11-09 | Disposition: A | Discharge status: Home / Self Care | Payer: Medicare Other | Source: Ambulatory Visit | Attending: Internal Medicine | Admitting: Internal Medicine

## 2014-11-09 VITALS — BP 128/78 | HR 112 | Ht 61.0 in | Wt 112.0 lb

## 2014-11-09 DIAGNOSIS — J449 Chronic obstructive pulmonary disease, unspecified: Secondary | ICD-10-CM

## 2014-11-09 DIAGNOSIS — Z720 Tobacco use: Secondary | ICD-10-CM

## 2014-11-09 DIAGNOSIS — F17200 Nicotine dependence, unspecified, uncomplicated: Secondary | ICD-10-CM

## 2014-11-09 DIAGNOSIS — R0902 Hypoxemia: Secondary | ICD-10-CM

## 2014-11-09 DIAGNOSIS — I272 Other secondary pulmonary hypertension: Secondary | ICD-10-CM

## 2014-11-09 DIAGNOSIS — I2722 Pulmonary hypertension due to left heart disease: Secondary | ICD-10-CM

## 2014-11-09 DIAGNOSIS — I519 Heart disease, unspecified: Secondary | ICD-10-CM

## 2014-11-09 LAB — PULMONARY FUNCTION TEST
DL/VA % PRED: 72 %
DL/VA: 3.19 ml/min/mmHg/L
DLCO unc % pred: 66 %
DLCO unc: 13.38 ml/min/mmHg
FEF 25-75 POST: 1.89 L/s
FEF 25-75 Pre: 1.93 L/sec
FEF2575-%CHANGE-POST: -2 %
FEF2575-%PRED-PRE: 104 %
FEF2575-%Pred-Post: 102 %
FEV1-%Change-Post: 0 %
FEV1-%PRED-PRE: 90 %
FEV1-%Pred-Post: 89 %
FEV1-POST: 1.85 L
FEV1-PRE: 1.86 L
FEV1FVC-%CHANGE-POST: 2 %
FEV1FVC-%PRED-PRE: 107 %
FEV6-%CHANGE-POST: -3 %
FEV6-%Pred-Post: 84 %
FEV6-%Pred-Pre: 87 %
FEV6-Post: 2.2 L
FEV6-Pre: 2.28 L
FEV6FVC-%Change-Post: 0 %
FEV6FVC-%Pred-Post: 103 %
FEV6FVC-%Pred-Pre: 103 %
FVC-%CHANGE-POST: -3 %
FVC-%Pred-Post: 81 %
FVC-%Pred-Pre: 84 %
FVC-Post: 2.21 L
FVC-Pre: 2.28 L
POST FEV1/FVC RATIO: 84 %
PRE FEV1/FVC RATIO: 82 %
Post FEV6/FVC ratio: 100 %
Pre FEV6/FVC Ratio: 100 %
RV % pred: 79 %
RV: 1.59 L
TLC % pred: 88 %
TLC: 4.07 L

## 2014-11-09 NOTE — Progress Notes (Signed)
   Subjective:    Patient ID: Carol Warren, female    DOB: 14-Dec-1946,    MRN: [id number]  HPI  78 yowf active smoker with AV dz with LHC 09/23/14 EDP 32  referred 11/09/2014 to pulmonary clinic for preop pulmonary clearance    11/09/2014 1st [facility] [facility] office visit/ Leonela Kivi  / still smoking  Chief Complaint  Patient presents with  . Pulmonary Consult    Referred by Dr. Percival Spanish. Pt needing clearance for surgery for "leaky valve".  She c/o SOB "since allergy season"- occurs "when I walk a good ways".  She also c/o cough- non prod.  no longer grocery shopping or vacuuming due to back x one year Min dry day cough not worse in ams Not really using advair consistently, rare perceived need for saba   No obvious other patterns in day to day or daytime variabilty or assoc chronic cough or cp or chest tightness, subjective wheeze overt sinus or hb symptoms. No unusual exp hx or h/o childhood pna/ asthma or knowledge of premature birth.  Sleeping ok without nocturnal  or early am exacerbation  of respiratory  c/o's or need for noct saba. Also denies any obvious fluctuation of symptoms with weather or environmental changes or other aggravating or alleviating factors except as outlined above   Current Medications, Allergies, Complete Past Medical History, Past Surgical History, Family History, and Social History were reviewed in [employer] record.             Review of Systems  Constitutional: Positive for unexpected weight change. Negative for fever and chills.  HENT: Positive for dental problem and sneezing. Negative for congestion, ear pain, nosebleeds, postnasal drip, rhinorrhea, sinus pressure, sore throat, trouble swallowing and voice change.   Eyes: Negative for visual disturbance.  Respiratory: Positive for cough and shortness of breath. Negative for choking.   Cardiovascular: Negative for chest pain and leg swelling.  Gastrointestinal: Negative for  vomiting, abdominal pain and diarrhea.  Genitourinary: Negative for difficulty urinating.  Musculoskeletal: Positive for arthralgias.  Skin: Negative for rash.  Neurological: Positive for headaches. Negative for tremors and syncope.  Hematological: Does not bruise/bleed easily.       Objective:   Physical Exam  amb wf nad tends to ramble when asked questions related to symptoms  Wt Readings from Last 3 Encounters:  11/09/14 112 lb (50.803 kg)  10/03/14 116 lb 12.8 oz (52.98 kg)  09/23/14 115 lb (52.164 kg)    Vital signs reviewed  HEENT: nl dentition, turbinates, and orophanx. Nl external ear canals without cough reflex   NECK :  without JVD/Nodes/TM/ nl carotid upstrokes bilaterally   LUNGS: no acc muscle use, clear to A and P bilaterally without cough on insp or exp maneuvers   CV:  RRR II-III. VI sem   no s3 or murmur or increase in P2, no edema   ABD:  soft and nontender with nl excursion in the supine position. No bruits or organomegaly, bowel sounds nl  MS:  warm without deformities, calf tenderness, cyanosis or clubbing  SKIN: warm and dry without lesions    NEURO:  alert, approp, no deficits    CXR  11/09/2014 :   No active cardiopulmonary disease        Assessment & Plan:

## 2014-11-09 NOTE — Assessment & Plan Note (Addendum)
-   02 dep with exertion only  - hfa 50% 07/07/2013  - PFTs 11/09/2014  FEV1  1.86(90%) ratio 82 and no change p saba and DLCO 66 corrects to 72%  At this point all efforts should be focused on smoking cessation. She does not have enough airflow obst to warrant maint advair unless she finds she's having a tendency to AB, which has not been the case to date but for which she is at risk.

## 2014-11-09 NOTE — Telephone Encounter (Signed)
Patient notified OK to use Nicoderm. She voiced understanding

## 2014-11-09 NOTE — Assessment & Plan Note (Signed)

## 2014-11-09 NOTE — Progress Notes (Signed)
PFT done today. 

## 2014-11-09 NOTE — Patient Instructions (Addendum)
Ok to stop advair to see what difference it makes in your activity tolerance  Or need for your albuterol and if it does then ok restart  The key is to stop smoking completely before smoking completely stops you!   Pulmonary follow up is as needed

## 2014-11-09 NOTE — Assessment & Plan Note (Addendum)
LHC 09/23/14 EDP 32  With mean PA 45 and pcwp 36/ co 3 so PVR =3   No evidence of significant  PAH  ie arteriopathy.

## 2014-11-10 NOTE — Progress Notes (Signed)
Quick Note:  Spoke with pt and notified of results per Dr. Wert. Pt verbalized understanding and denied any questions.  ______ 

## 2014-11-14 ENCOUNTER — Encounter: Payer: Self-pay | Admitting: Cardiology

## 2014-11-14 ENCOUNTER — Ambulatory Visit (INDEPENDENT_AMBULATORY_CARE_PROVIDER_SITE_OTHER): Payer: Medicare Other | Admitting: Cardiology

## 2014-11-14 VITALS — BP 120/70 | HR 88 | Ht 61.0 in | Wt 110.0 lb

## 2014-11-14 DIAGNOSIS — I272 Other secondary pulmonary hypertension: Secondary | ICD-10-CM

## 2014-11-14 DIAGNOSIS — I519 Heart disease, unspecified: Secondary | ICD-10-CM

## 2014-11-14 DIAGNOSIS — I719 Aortic aneurysm of unspecified site, without rupture: Secondary | ICD-10-CM

## 2014-11-14 DIAGNOSIS — I350 Nonrheumatic aortic (valve) stenosis: Secondary | ICD-10-CM

## 2014-11-14 DIAGNOSIS — I2722 Pulmonary hypertension due to left heart disease: Secondary | ICD-10-CM

## 2014-11-14 NOTE — Progress Notes (Signed)
HPI The patient presents for followup of aortic stenosis.  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 Her last echo she or a year ago demonstrated no left ventricular dilatation with normal left ventricular function.  There was mild AI is noted moderately severe AI. 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 A report of this and ran off hospital though I never had these images. I did send her for CT a few months ago which demonstrated 4.7 cm dilatation at the sinus of Valsalva, 3.5 at the sinotubular junction and 4.5 in the descending aorta. I'm not sure this was change compared with previous. She returns for followup. She denies any new cardiovascular symptoms however. She does have long-standing dyspnea and did see a pulmonologist but has not followed up.   She denies any new chest pressure, neck or arm discomfort. She feels drainage from her sinuses and has fullness in her left chest related to this. She denies any neck or arm discomfort. She does notice palpitations, presyncope or syncope.  She is smoking cigarettes.  She is unfortunately mostly still limited by her back pain and apparently is going to have some kind of a spinal implant.  Allergies  Allergen Reactions  . Oxycodone Nausea Only    Current Outpatient Prescriptions  Medication Sig Dispense Refill  . albuterol (PROVENTIL) (2.5 MG/3ML) 0.083% nebulizer solution Take 2.5 mg by nebulization 2 (two) times daily as needed for wheezing.    . AMBULATORY NON FORMULARY MEDICATION Topical nasal rinse/ Tobram Budesonide Use nasal as needed    . B Complex Vitamins (VITAMIN B COMPLEX PO) Take 1 tablet by mouth daily.    [name] buPROPion (WELLBUTRIN XL) 300 MG 24 hr tablet Take 300 mg by mouth daily.    . Calcium Carbonate-Vitamin D (CALCIUM-D PO) Take 1 tablet by mouth daily.     . cyanocobalamin (,VITAMIN B-12,) 1000 MCG/ML injection Inject 1,000 mcg into the muscle once a week.    . diazepam (VALIUM) 10 MG tablet Take 10 mg by mouth 3 (three)  times daily as needed for anxiety.    . fluticasone (FLONASE) 50 MCG/ACT nasal spray Place 1 spray into the nose 2 (two) times daily.    . furosemide (LASIX) 20 MG tablet Take 1 tablet (20 mg total) by mouth daily. 30 tablet 6  . HYDROcodone-acetaminophen (NORCO/VICODIN) 5-325 MG per tablet Take 0.5 tablets by mouth every 4 (four) hours as needed for moderate pain.     [name] ibuprofen (ADVIL,MOTRIN) 200 MG tablet Take 400 mg by mouth every 6 (six) hours as needed for headache.     . loratadine (CLARITIN) 10 MG tablet Take 10 mg by mouth daily.    [name] losartan (COZAAR) 50 MG tablet Take 1 tablet (50 mg total) by mouth daily. 30 tablet 6  . meloxicam (MOBIC) 15 MG tablet Take 15 mg by mouth daily as needed for pain.    . Menthol, Topical Analgesic, (ICY HOT EX) Apply 1 patch topically daily.    . montelukast (SINGULAIR) 10 MG tablet Take 10 mg by mouth at bedtime.    . Multiple Vitamin (MULTIVITAMIN) capsule Take 1 capsule by mouth daily.      . Olopatadine HCl (PATANASE) 0.6 % SOLN Place 2 puffs into both nostrils 2 (two) times daily.    [name] omeprazole (PRILOSEC) 20 MG capsule Take 40 mg by mouth daily.    . polyethylene glycol (MIRALAX / GLYCOLAX) packet Take 17 g by mouth daily as needed.    [name]  Polyvinyl Alcohol-Povidone (REFRESH OP) Place 2 drops into both eyes 2 (two) times daily.    . Probiotic Product (PROBIOTIC DAILY PO) Take 1 capsule by mouth daily. Ultimate flora    . pseudoephedrine-guaifenesin (MUCINEX D) 60-600 MG per tablet Take 1 tablet by mouth as needed for congestion.    . ranitidine (ZANTAC) 300 MG capsule Take 300 mg by mouth every evening.    . Saline (SIMPLY SALINE) 0.9 % AERS Place 2 sprays into both nostrils 2 (two) times daily.    . traZODone (DESYREL) 50 MG tablet Take 50 mg by mouth at bedtime.     No current facility-administered medications for this visit.    Past Medical History  Diagnosis Date  . 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 Osteopenia   . HSV-1  (herpes simplex virus 1) infection   . HSV-2 (herpes simplex virus 2) infection   . Bicuspid aortic valve     CONGENITAL  . Aortic aneurysm     CARDIOLOGIST IS DR. Bettina Gavia IN [place]  . Adrenal tumor   . Degenerative disc disease     CERVICAL AND LUMBAR (DR. Lorin Mercy)  . Smoker   . Anxiety   . Chronic sinusitis   . Chronic depression   . Osteoarthritis   . Carpal tunnel syndrome, bilateral   . Pernicious anemia   . Insomnia   . Asthma   . Pericardial cyst   . Chronic fatigue   . Vitamin B 12 deficiency   . Plantar fasciitis   . Hammer toe of right foot   . Hepatomegaly   . Rosacea   . Esophagitis   . Gastritis     Past Surgical History  Procedure Laterality Date  . Excision of vulvar cis    . Sinus procedure  2009    x 6  . Excision of basal cell ca      SKIN   . Cataract extraction Bilateral     X2  . Carpal tunnel release Bilateral 2008  . Bunionectomy with hammertoe reconstruction Right   . Colonoscopy    . Upper gastrointestinal endoscopy    . Tee without cardioversion N/A 10/06/2014    Procedure: TRANSESOPHAGEAL ECHOCARDIOGRAM (TEE);  Surgeon: Sueanne Margarita, MD;  Location[facility] ENDOSCOPY;  Service: Cardiovascular;  Laterality: N/A;    ROS:  As stated in the HPI and negative for all other systems.  PHYSICAL EXAM BP 120/70 mmHg  Pulse 88  Ht 5\' 1"  (1.549 m)  Wt 110 lb (49.896 kg)  BMI 20.80 kg/m2 GENERAL:  Well appearing HEENT:  Pupils equal round and reactive, fundi not visualized, oral mucosa unremarkable NECK:  No jugular venous distention, waveform within normal limits, carotid upstroke brisk and symmetric, no bruits, no thyromegaly LYMPHATICS:  No cervical, inguinal adenopathy LUNGS:  Clear to auscultation bilaterally BACK:  No CVA tenderness CHEST:  Unremarkable HEART:  PMI not displaced or sustained,S1 and S2 within normal limits, no S3, no S4, no clicks, no rubs, 3/6 apical systolic murmur also at the right upper sternal border, early peaking, no  diastolic murmurs ABD:  Flat, positive bowel sounds normal in frequency in pitch, no bruits, no rebound, no guarding, no midline pulsatile mass, no hepatomegaly, no splenomegaly EXT:  Mildly diminished bilateral dorsalis pedis and posterior tibialis plus pulses throughout, no edema, no cyanosis no clubbing SKIN:  No rashes no nodules NEURO:  Cranial nerves II through XII grossly intact, motor grossly intact throughout PSYCH:  Cognitively intact, oriented  to person place and time  EKG:  Sinus rhythm rate 109, LAD, intervals within normal limits, no acute ST-T wave changes. 11/14/2014  ASSESSMENT AND PLAN  AORTIC STENOSIS/AI:  I do not think she has acute symptoms related to this. However, I will followup with another echo.  AORTIC ROOT ANEURYSM:  I will try to obtain the old results and see if we can make any "apples to apples comparison" of her aortic root dimensions.  She reported a 4 cm aortic not sure at what level this was reported. Certainly a 4.5 cm particularly with bicuspid valve she would need close followup to see if this is increasing her guidelines greater than 0.5 cm per year. It seems to be stable at 4.5 probably size this with CT angiograms yearly. She may also need TEE. This would further help clarify whether this is a bicuspid valve or not.    TOBACCO ABUSE:  She doesn't think she would be able to quit at this point although we discussed this again.

## 2014-11-14 NOTE — Patient Instructions (Signed)
Your physician recommends that you schedule a follow-up appointment pending what is seen on the Echo  We are ordering a Dobutamine Echo to be done at the [address] office

## 2014-11-14 NOTE — Progress Notes (Signed)
HPI The patient presents for followup of aortic stenosis.  She has AS/AI with a questionable bicuspid valve previously followed in [facility].  A previous echo here in 2014 suggested moderate aortic stenosis and a preserved EF.  However, follow up echo demonstrated that the EF was reduced at 35%.  I performed a cardiac cath and she had mild coronary plaque.  She had a mean gradient that was low.   Her valve area is 0.84.  It was felt that her AS could be underestimated secondary to the low EF.   However, she had markedly elevated EDP.  She had significant dyspnea after that procedure with hypoxemia but she responded to Lasix.  A TEE was scheduled but Dr. Radford Pax was unable to pass the probe secondary to the angle and size of the esophagus.  I did send her to see a pulmonologist.  She saw Dr. Melvyn Novas.  PFTs did not demonstrate severe disease and she was advised to quit smoking.  .  She had mild AI.  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   I did send her for CT a few months ago which demonstrated 4.7 cm dilatation at the sinus of Valsalva, 3.5 at the sinotubular junction and 4.5 in the descending aorta.  She presents for follow up and I have extensively reviewed her recent studies including the cath films and most recent echo films.  She continues to have SOB with activity.  She is not however reporting PND or orthopnea. She's not having any palpitations, presyncope or syncope.   IShe's had no weight gain or edema. She is still quite limited in her activities because of back pain. There are no specific plans however at this time to place a stimulator which has been discussed. She is finally having plan quit smoking. She has the patches on order and she is using the electronic cigarette.   Allergies  Allergen Reactions  . Oxycodone Nausea Only    Current Outpatient Prescriptions  Medication Sig Dispense Refill  . albuterol (PROVENTIL) (2.5 MG/3ML) 0.083% nebulizer solution Take 2.5 mg by nebulization 2  (two) times daily as needed for wheezing.    . AMBULATORY NON FORMULARY MEDICATION Topical nasal rinse/ Tobram Budesonide Use nasal as needed    . B Complex Vitamins (VITAMIN B COMPLEX PO) Take 1 tablet by mouth daily.    [name] buPROPion (WELLBUTRIN XL) 300 MG 24 hr tablet Take 300 mg by mouth daily.    . Calcium Carbonate-Vitamin D (CALCIUM-D PO) Take 1 tablet by mouth daily.     . cyanocobalamin (,VITAMIN B-12,) 1000 MCG/ML injection Inject 1,000 mcg into the muscle once a week.    . diazepam (VALIUM) 10 MG tablet Take 10 mg by mouth 3 (three) times daily as needed for anxiety.    . fluticasone (FLONASE) 50 MCG/ACT nasal spray Place 1 spray into the nose 2 (two) times daily.    . furosemide (LASIX) 20 MG tablet Take 1 tablet (20 mg total) by mouth daily. 30 tablet 6  . HYDROcodone-acetaminophen (NORCO/VICODIN) 5-325 MG per tablet Take 0.5 tablets by mouth every 4 (four) hours as needed for moderate pain.     [name] ibuprofen (ADVIL,MOTRIN) 200 MG tablet Take 400 mg by mouth every 6 (six) hours as needed for headache.     . loratadine (CLARITIN) 10 MG tablet Take 10 mg by mouth daily.    [name] losartan (COZAAR) 50 MG tablet Take 1 tablet (50 mg total) by mouth daily. 30 tablet 6  .  meloxicam (MOBIC) 15 MG tablet Take 15 mg by mouth daily as needed for pain.    . Menthol, Topical Analgesic, (ICY HOT EX) Apply 1 patch topically daily.    . montelukast (SINGULAIR) 10 MG tablet Take 10 mg by mouth at bedtime.    . Multiple Vitamin (MULTIVITAMIN) capsule Take 1 capsule by mouth daily.      . Olopatadine HCl (PATANASE) 0.6 % SOLN Place 2 puffs into both nostrils 2 (two) times daily.    [name] omeprazole (PRILOSEC) 20 MG capsule Take 40 mg by mouth daily.    . polyethylene glycol (MIRALAX / GLYCOLAX) packet Take 17 g by mouth daily as needed.    . Polyvinyl Alcohol-Povidone (REFRESH OP) Place 2 drops into both eyes 2 (two) times daily.    . Probiotic Product (PROBIOTIC DAILY PO) Take 1 capsule by mouth daily. Ultimate  flora    . pseudoephedrine-guaifenesin (MUCINEX D) 60-600 MG per tablet Take 1 tablet by mouth as needed for congestion.    . ranitidine (ZANTAC) 300 MG capsule Take 300 mg by mouth every evening.    . Saline (SIMPLY SALINE) 0.9 % AERS Place 2 sprays into both nostrils 2 (two) times daily.    . traZODone (DESYREL) 50 MG tablet Take 50 mg by mouth at bedtime.     No current facility-administered medications for this visit.    Past Medical History  Diagnosis Date  . 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 Osteopenia   . HSV-1 (herpes simplex virus 1) infection   . HSV-2 (herpes simplex virus 2) infection   . Bicuspid aortic valve     CONGENITAL  . Aortic aneurysm     CARDIOLOGIST IS DR. Bettina Gavia IN [place]  . Adrenal tumor   . Degenerative disc disease     CERVICAL AND LUMBAR (DR. Lorin Mercy)  . Smoker   . Anxiety   . Chronic sinusitis   . Chronic depression   . Osteoarthritis   . Carpal tunnel syndrome, bilateral   . Pernicious anemia   . Insomnia   . Asthma   . Pericardial cyst   . Chronic fatigue   . Vitamin B 12 deficiency   . Plantar fasciitis   . Hammer toe of right foot   . Hepatomegaly   . Rosacea   . Esophagitis   . Gastritis     Past Surgical History  Procedure Laterality Date  . Excision of vulvar cis    . Sinus procedure  2009    x 6  . Excision of basal cell ca      SKIN   . Cataract extraction Bilateral     X2  . Carpal tunnel release Bilateral 2008  . Bunionectomy with hammertoe reconstruction Right   . Colonoscopy    . Upper gastrointestinal endoscopy    . Tee without cardioversion N/A 10/06/2014    Procedure: TRANSESOPHAGEAL ECHOCARDIOGRAM (TEE);  Surgeon: Sueanne Margarita, MD;  Location: [facility] ENDOSCOPY;  Service: Cardiovascular;  Laterality: N/A;    ROS:  As stated in the HPI and negative for all other systems.  PHYSICAL EXAM BP 120/70 mmHg  Pulse 88  Ht 5\' 1"  (1.549 m)  Wt 110 lb (49.896 kg)  BMI 20.80 kg/m2 GENERAL:  Frail appearing and  looking younger than her stated age. HEENT:  Pupils equal round and reactive, fundi not visualized, oral mucosa unremarkable NECK:  No jugular venous distention, waveform within normal limits, carotid upstroke brisk and symmetric, no bruits,  no thyromegaly LYMPHATICS:  No cervical, inguinal adenopathy LUNGS:  Clear to auscultation bilaterally BACK:  No CVA tenderness CHEST:  Unremarkable HEART:  PMI not displaced or sustained,S1 and S2 within normal limits, no S3, no S4, no clicks, no rubs, 3/6 apical systolic murmur also at the right upper sternal border, early peaking, no diastolic murmurs ABD:  Flat, positive bowel sounds normal in frequency in pitch, no bruits, no rebound, no guarding, no midline pulsatile mass, no hepatomegaly, no splenomegaly EXT:  Mildly diminished bilateral dorsalis pedis and posterior tibialis plus pulses throughout, no edema, no cyanosis no clubbing SKIN:  No rashes no nodules  ASSESSMENT AND PLAN  AORTIC STENOSIS/AI:  The question is whether she has a dilated cardiomyopathy unrelated to her aortic valve. The etiology of this would not be clear. However, her LV dysfunction could be secondary to her aortic stenosis. I suspect we are underestimating the severity now the aortic stenosis and I will order a dobutamine echocardiogram to further assess. I then we'll likely send her for CV surgery evaluation although she would be very high risk of her LV dysfunction and overall frail status for valve replacement. She would need aortic root replacement as well.  AORTIC ROOT ANEURYSM:  This will be will address as above.  TOBACCO ABUSE:  I am proud of her for attempts at stopping smoking.

## 2014-11-21 ENCOUNTER — Other Ambulatory Visit (HOSPITAL_COMMUNITY): Payer: Medicare Other

## 2014-11-21 ENCOUNTER — Telehealth: Payer: Self-pay | Admitting: Cardiology

## 2014-11-21 NOTE — Telephone Encounter (Signed)
New Msg   Patient calling to see if she can continue to take her anxiety and pain meds before procedure. Please contact at [phone].

## 2014-11-21 NOTE — Telephone Encounter (Signed)
Pt. Informed that it was ok to take her meds

## 2014-11-22 ENCOUNTER — Ambulatory Visit (HOSPITAL_COMMUNITY): Payer: Medicare Other | Attending: Cardiology | Admitting: Radiology

## 2014-11-22 ENCOUNTER — Encounter (HOSPITAL_COMMUNITY): Payer: Self-pay | Admitting: Radiology

## 2014-11-22 DIAGNOSIS — I350 Nonrheumatic aortic (valve) stenosis: Secondary | ICD-10-CM | POA: Insufficient documentation

## 2014-11-22 NOTE — Progress Notes (Signed)
Patient ID: Carol Warren, female   DOB: 09-Oct-1946, 68 y.o.   MRN: [id number]  I spoke with Dr. Percival Spanish yesterday to confirm the appropriate testing concerning the Dobutamine Echo scheduled for today, 11/22/14. He talked with Dr. Meda Coffee and Dr. Johnsie Cancel, Liane Comber and P. Nishan and they all agreed that the AS protocol of the Dobutamine Echo would be the best test for her evaluation.  Evalina Field, RT-N

## 2014-11-22 NOTE — Progress Notes (Signed)
Dobutamine Stress Echocardiogram performed to Evaluate Aortic Stenosis.

## 2014-11-25 ENCOUNTER — Telehealth: Payer: Self-pay | Admitting: Cardiology

## 2014-11-25 NOTE — Telephone Encounter (Signed)
New Msg   Patient would like to be called back in regards to test that she had last week. Patient would like to know if she is supposed to come in for another appt and would like to know the results of the test. Please contact at [phone].

## 2014-11-25 NOTE — Telephone Encounter (Signed)
Spoke with pt, aware echo has not been looked at by dr hochrein. Aware follow up appointment will be decided based on the test results.

## 2014-11-30 ENCOUNTER — Telehealth: Payer: Self-pay | Admitting: Cardiology

## 2014-11-30 NOTE — Telephone Encounter (Signed)
Returned call to patient she wanted to know echo results and wanted to ask Dr.Hochrein if ok to have a electronic back stimulator inserted by Dr.Newton at [facility].Dr.Hochrein out of office will send message to him for advice.

## 2014-11-30 NOTE — Telephone Encounter (Signed)
Pt would like her echo results from 11-24- please.

## 2014-12-01 ENCOUNTER — Other Ambulatory Visit: Payer: Self-pay | Admitting: *Deleted

## 2014-12-01 ENCOUNTER — Telehealth: Payer: Self-pay | Admitting: Cardiology

## 2014-12-01 DIAGNOSIS — I350 Nonrheumatic aortic (valve) stenosis: Secondary | ICD-10-CM

## 2014-12-01 NOTE — Telephone Encounter (Signed)
Will be having a tooth pull on Monday and wants to know will she have to get any Pre-Meds.. Please call    Thanks

## 2014-12-01 NOTE — Telephone Encounter (Signed)
Spoke with pt, no SBE needed at this time.

## 2014-12-08 ENCOUNTER — Encounter (HOSPITAL_COMMUNITY): Payer: Self-pay | Admitting: Cardiology

## 2014-12-09 ENCOUNTER — Ambulatory Visit: Payer: Medicare Other | Admitting: Cardiology

## 2014-12-13 ENCOUNTER — Encounter: Payer: Self-pay | Admitting: Surgery

## 2014-12-13 ENCOUNTER — Institutional Professional Consult (permissible substitution) (INDEPENDENT_AMBULATORY_CARE_PROVIDER_SITE_OTHER): Payer: Medicare Other | Admitting: Surgery

## 2014-12-13 VITALS — BP 135/86 | HR 116 | Resp 16 | Ht 61.0 in | Wt 113.0 lb

## 2014-12-13 DIAGNOSIS — Q254 Other congenital malformations of aorta: Secondary | ICD-10-CM

## 2014-12-13 DIAGNOSIS — I7123 Aneurysm of the descending thoracic aorta, without rupture: Secondary | ICD-10-CM

## 2014-12-13 DIAGNOSIS — I712 Thoracic aortic aneurysm, without rupture: Secondary | ICD-10-CM

## 2014-12-13 DIAGNOSIS — I350 Nonrheumatic aortic (valve) stenosis: Secondary | ICD-10-CM

## 2014-12-13 DIAGNOSIS — Q2549 Other congenital malformations of aorta: Secondary | ICD-10-CM

## 2014-12-13 NOTE — Progress Notes (Signed)
Cardiothoracic Surgery Consultation   PCP is CarolCAROLINE, MD Referring Provider is Minus Breeding, MD  Chief Complaint  Patient presents with  . Aortic Stenosis    severe...eval for surgery.Marland KitchenECHO 09/19/14.DOBUTAMINE STRESS ECHO 11/22/14.Marland KitchenPFT'S 11/09/14  . TAA    HPI:  The patient is a 68 year old smoker with a history of possible bicuspid aortic valve disease with AS/AI. Her most recent 2D echo on 09/19/2014 showed a moderately thickened bicuspid aortic valve with moderate stenosis and moderate regurgitation with a mean gradient of 24 mm Hg. The LVEF was reduced to 30-35% compared to 07/07/2013 when the EF was 55-65% with mild to moderate stenosis and a mean gradient of 22 mm Hg with moderate AI. The ascending aorta has been dilated with a root diameter recorded as 36 mm in 2014 and 38 mm now. She underwent cardiac cath on 09/23/2014 which showed a PAP of 59/28 with a wedge of 36 and an AVA of 0.84 cm2 with a mean gradient measured at only 12.7 mm Hg. LVEDP was 32. There was no significant coronary disease. Since she had a low gradient considering the appearance of her valve and cardiomyopathy, a dobutamine stress echo was done that showed low gradient severe AS with a mean gradient at rest of 33 increasing to 46 at 10 mcg and 52 at 20 mcg. She had a CTA of the chest in July 2015 that showed a maximum diameter of the sinus of Valsalva of 4.7 cm, STJ of 3.5 cm, and ascending aorta of 4.0 cm.  She has long-standing dyspnea and is followed by Dr. Melvyn Novas. She continues to smoke 3/4 pack of cigarettes per day. She does report exertional fatigue and says she gets very tired later in the day. 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     Past Medical History  Diagnosis Date  . 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 Osteopenia   . HSV-1 (herpes simplex virus 1) infection    . HSV-2 (herpes simplex virus 2) infection   . Bicuspid aortic valve     CONGENITAL  . Aortic aneurysm     CARDIOLOGIST IS DR. Bettina Gavia IN [place]  . Adrenal tumor   . Degenerative disc disease     CERVICAL AND LUMBAR (DR. Lorin Mercy)  . Smoker   . Anxiety   . Chronic sinusitis   . Chronic depression   . Osteoarthritis   . Carpal tunnel syndrome, bilateral   . Pernicious anemia   . Insomnia   . Asthma   . Pericardial cyst   . Chronic fatigue   . Vitamin B 12 deficiency   . Plantar fasciitis   . Hammer toe of right foot   . Hepatomegaly   . Rosacea   . Esophagitis   . Gastritis     Past Surgical History  Procedure Laterality Date  . Excision of vulvar cis    . Sinus procedure  2009    x 6  . Excision of basal cell ca      SKIN   . Cataract extraction Bilateral     X2  . Carpal tunnel release Bilateral 2008  . Bunionectomy with hammertoe reconstruction Right   . Colonoscopy    . Upper gastrointestinal endoscopy    . Tee without cardioversion N/A 10/06/2014    Procedure: TRANSESOPHAGEAL ECHOCARDIOGRAM (TEE);  Surgeon: Sueanne Margarita, MD;  Location: [facility] ENDOSCOPY;  Service: Cardiovascular;  Laterality: N/A;  . Left and right heart catheterization with coronary angiogram N/A 09/23/2014    Procedure: LEFT AND RIGHT HEART CATHETERIZATION WITH CORONARY ANGIOGRAM;  Surgeon: Minus Breeding, MD;  Location: [facility] CATH LAB;  Service: Cardiovascular;  Laterality: N/A;    Family History  Problem Relation Age of Onset  . Pancreatic cancer Mother 50  . Diabetes Father   . Hypertension Father   . Heart disease Father 67    CAD  . Breast cancer Sister   . Diabetes Maternal Grandmother   . Hypertension Maternal Grandmother   . Cancer Maternal Grandfather     colon or stomach  . Hypertension Paternal Grandmother   . Heart disease Paternal Grandmother     Later onset  . Asthma Grandchild   . Diverticulosis Paternal Grandmother     Social History History  Substance Use Topics  .  Smoking status: Current Every Day Smoker -- 1.00 packs/day for 50 years    Types: Cigarettes  . Smokeless tobacco: Never Used     Comment: uses vapor cig  . Alcohol Use: No    Current Outpatient Prescriptions  Medication Sig Dispense Refill  . albuterol (PROVENTIL) (2.5 MG/3ML) 0.083% nebulizer solution Take 2.5 mg by nebulization 2 (two) times daily as needed for wheezing.    . AMBULATORY NON FORMULARY MEDICATION Topical nasal rinse/ Tobram Budesonide Use nasal as needed    . B Complex Vitamins (VITAMIN B COMPLEX PO) Take 1 tablet by mouth daily.    [name] buPROPion (WELLBUTRIN XL) 300 MG 24 hr tablet Take 300 mg by mouth daily.    . Calcium Carbonate-Vitamin D (CALCIUM-D PO) Take 1 tablet by mouth daily.     . cyanocobalamin (,VITAMIN B-12,) 1000 MCG/ML injection Inject 1,000 mcg into the muscle once a week.    . diazepam (VALIUM) 10 MG tablet Take 10 mg by mouth 3 (three) times daily as needed for anxiety.    . fluticasone (FLONASE) 50 MCG/ACT nasal spray Place 1 spray into the nose 2 (two) times daily.    . furosemide (LASIX) 20 MG tablet Take 1 tablet (20 mg total) by mouth daily. 30 tablet 6  . HYDROcodone-acetaminophen (NORCO/VICODIN) 5-325 MG per tablet Take 0.5 tablets by mouth every 4 (four) hours as needed for moderate pain.     [name] ibuprofen (ADVIL,MOTRIN) 200 MG tablet Take 400 mg by mouth every 6 (six) hours as needed for headache.     . loratadine (CLARITIN) 10 MG tablet Take 10 mg by mouth daily.    [name] losartan (COZAAR) 50 MG tablet Take 1 tablet (50 mg total) by mouth daily. 30 tablet 6  . meloxicam (MOBIC) 15 MG tablet Take 15 mg by mouth daily as needed for pain.    . Menthol, Topical Analgesic, (ICY HOT EX) Apply 1 patch topically daily.    . montelukast (SINGULAIR) 10 MG tablet Take 10 mg by mouth at bedtime.    . Multiple Vitamin (MULTIVITAMIN) capsule Take 1 capsule by mouth daily.      . Olopatadine HCl (PATANASE) 0.6 % SOLN Place 2 puffs into both nostrils 2 (two) times  daily.    [name] omeprazole (PRILOSEC) 20 MG capsule Take 40 mg by mouth daily.    . polyethylene glycol (MIRALAX / GLYCOLAX) packet Take 17 g by mouth daily as needed.    . Polyvinyl Alcohol-Povidone (REFRESH OP) Place 2 drops into both eyes 2 (two) times  daily.    . Probiotic Product (PROBIOTIC DAILY PO) Take 1 capsule by mouth daily. Ultimate flora    . pseudoephedrine-guaifenesin (MUCINEX D) 60-600 MG per tablet Take 1 tablet by mouth as needed for congestion.    . ranitidine (ZANTAC) 300 MG capsule Take 300 mg by mouth every evening.    . Saline (SIMPLY SALINE) 0.9 % AERS Place 2 sprays into both nostrils 2 (two) times daily.    . traZODone (DESYREL) 50 MG tablet Take 50 mg by mouth at bedtime.     No current facility-administered medications for this visit.    Allergies  Allergen Reactions  . Oxycodone Nausea Only    Review of Systems  Constitutional: Positive for activity change and fatigue. Negative for chills, appetite change and unexpected weight change.  HENT:       Saw her dentist recently and was told that she had a tooth in the mandible anteriorly that needed to be removed. Mandibular teeth are in fair to poor condition. Upper partial denture.   Eyes: Negative.   Respiratory: Positive for shortness of breath.   Cardiovascular: Negative for chest pain, palpitations and leg swelling.  Gastrointestinal: Positive for constipation.       Reflux  Endocrine: Negative.   Genitourinary: Positive for frequency.  Musculoskeletal: Positive for joint swelling and arthralgias.  Skin: Negative.   Allergic/Immunologic: Negative.   Neurological: Positive for headaches.  Hematological: Bruises/bleeds easily.  Psychiatric/Behavioral: Positive for dysphoric mood. The patient is nervous/anxious.     BP 135/86 mmHg  Pulse 116  Resp 16  Ht 5\' 1"  (1.549 m)  Wt 113 lb (51.256 kg)  BMI 21.36 kg/m2  SpO2 97% Physical Exam  Constitutional: She is oriented to person, place, and time.  Thin,  frail-appearing woman who looks older than her age.  HENT:  Head: Normocephalic and atraumatic.  Mouth/Throat: Oropharynx is clear and moist.  Raspy voice  Eyes: EOM are normal. Pupils are equal, round, and reactive to light.  Neck: Normal range of motion. Neck supple. No JVD present. No thyromegaly present.  Cardiovascular: Normal rate and regular rhythm.   Murmur heard. Harsh 3/6 systolic murmur at RSB  Pulmonary/Chest: Effort normal and breath sounds normal. No respiratory distress. She has no wheezes. She has no rales.  Abdominal: Soft. Bowel sounds are normal. She exhibits no distension and no mass. There is no tenderness.  Musculoskeletal: Normal range of motion. She exhibits no edema or tenderness.  Neurological: She is alert and oriented to person, place, and time. She has normal strength. No cranial nerve deficit or sensory deficit.  Skin: Skin is dry.  Psychiatric: She has a normal mood and affect.     Diagnostic Tests:       *[facility] [place] [place]            [place], [place] [address]              [phone]  ------------------------------------------------------------------- Echocardiography  Patient:  Allyah, Heather MR #:    [id number] Study Date: 09/19/2014 Gender:   F Age:    60 Height:   154.9 cm Weight:   52.2 kg BSA:    1.5 m^2 Pt. Status: Room:  ATTENDING  Drema Dallas Hochrein REFERRING  Minus Breeding SONOGRAPHER [name], RCS PERFORMING  [facility], Outpatient  cc:  ------------------------------------------------------------------- LV EF: 30% -  35%  ------------------------------------------------------------------- Indications:   424.1 Aortic valve  disorders.  ------------------------------------------------------------------- History:  PMH: Bicuspid Aortic  Valve.  ------------------------------------------------------------------- Study Conclusions  - Left ventricle: The cavity size was normal. Wall thickness was normal. Systolic function was moderately to severely reduced. The estimated ejection fraction was in the range of 30% to 35%. There is akinesis of the anteroseptal myocardium. Features are consistent with a pseudonormal left ventricular filling pattern, with concomitant abnormal relaxation and increased filling pressure (grade 2 diastolic dysfunction). - Aortic valve: Bicuspid; moderately thickened, moderately calcified leaflets. Cusp separation was reduced. There was moderate stenosis. There was moderate regurgitation. Peak velocity (S): 302 cm/s. Mean gradient (S): 24 mm Hg. Aortic stenosis may be underestimated as a result of decreased ejection fraction. - Aorta: The aorta was moderately calcified. Aortic root dimension: 38 mm (ED). - Ascending aorta: The ascending aorta was mildly dilated. - Mitral valve: Calcified annulus. Mildly thickened leaflets . There was mild to moderate regurgitation. - Pulmonary arteries: Systolic pressure was mildly increased. PA peak pressure: 44 mm Hg (S).  Echocardiography. M-mode, complete 2D, spectral Doppler, and color Doppler. Birthdate: Patient birthdate: [birth year]. Age: Patient is 68 yr old. Sex: Gender: female.  BMI: 21.7 kg/m^2. Blood pressure:   110/70 Patient status: Outpatient. Study date: Study date: 09/19/2014. Study time: 12:30 PM. Location: Echo laboratory.  -------------------------------------------------------------------  ------------------------------------------------------------------- Left ventricle: The cavity size was normal. Wall thickness was normal. Systolic function was moderately to severely reduced. The estimated ejection fraction was in the range of 30% to 35%. Regional wall motion abnormalities:  There is  akinesis of the anteroseptal myocardium. Features are consistent with a pseudonormal left ventricular filling pattern, with concomitant abnormal relaxation and increased filling pressure (grade 2 diastolic dysfunction).  ------------------------------------------------------------------- Aortic valve:  Bicuspid; moderately thickened, moderately calcified leaflets. Cusp separation was reduced. Doppler:  There was moderate stenosis.  There was moderate regurgitation.  VTI ratio of LVOT to aortic valve: 0.26. Valve area (VTI): 0.97 cm^2. Indexed valve area (VTI): 0.65 cm^2/m^2. Peak velocity ratio of LVOT to aortic valve: 0.2. Valve area (Vmax): 0.74 cm^2. Indexed valve area (Vmax): 0.49 cm^2/m^2. Mean velocity ratio of LVOT to aortic valve: 0.18. Valve area (Vmean): 0.62 cm^2. Indexed valve area (Vmean): 0.41 cm^2/m^2.  Mean gradient (S): 24 mm Hg. Peak gradient (S): 36 mm Hg.  ------------------------------------------------------------------- Aorta: The aorta was moderately calcified. Ascending aorta: The ascending aorta was mildly dilated.  ------------------------------------------------------------------- Mitral valve:  Calcified annulus. Mildly thickened leaflets . Doppler: There was mild to moderate regurgitation.  Peak gradient (D): 4 mm Hg.  ------------------------------------------------------------------- Left atrium: LA volume/ BSA = 34.9 ml/m2. The atrium was normal in size.  ------------------------------------------------------------------- Right ventricle: The cavity size was normal. Wall thickness was normal. Systolic function was normal.  ------------------------------------------------------------------- Pulmonic valve:  Structurally normal valve.  Cusp separation was normal. Doppler: Transvalvular velocity was within the normal range. There was trivial  regurgitation.  ------------------------------------------------------------------- Tricuspid valve:  Structurally normal valve.  Leaflet separation was normal. Doppler: Transvalvular velocity was within the normal range. There was mild regurgitation.  ------------------------------------------------------------------- Pulmonary artery:  The main pulmonary artery was normal-sized. Systolic pressure was mildly increased.  ------------------------------------------------------------------- Right atrium: The atrium was normal in size.  ------------------------------------------------------------------- Pericardium: The pericardium was normal in appearance. There was no pericardial effusion.  ------------------------------------------------------------------- Systemic veins: Inferior vena cava: The vessel was normal in size. The respirophasic diameter changes were in the normal range (= 50%), consistent with normal central venous pressure. Diameter: 13 mm.  ------------------------------------------------------------------- Post procedure conclusions Ascending Aorta:  - The aorta was moderately calcified.  -------------------------------------------------------------------  Measurements  IVC                    Value     Reference ID                    13  mm    ---------  Left ventricle              Value     Reference LV ID, ED, PLAX chordal          51.6 mm    43 - 52 LV ID, ES, PLAX chordal      (H)   44.9 mm    23 - 38 LV fx shortening, PLAX chordal  (L)   13  %    >=29 LV PW thickness, ED            11.7 mm    --------- IVS/LV PW ratio, ED            0.87      <=1.3 Stroke volume, 2D             50  ml    --------- Stroke volume/bsa, 2D           33  ml/m^2  --------- LV ejection fraction, 1-p  A4C       32  %    --------- LV end-diastolic volume, 2-p       121  ml    --------- LV end-systolic volume, 2-p        83  ml    --------- LV ejection fraction, 2-p         31  %    --------- Stroke volume, 2-p            38  ml    --------- LV end-diastolic volume/bsa, 2-p     81  ml/m^2  --------- LV end-systolic volume/bsa, 2-p      55  ml/m^2  --------- Stroke volume/bsa, 2-p          25.3 ml/m^2  --------- LV e&', lateral              4.93 cm/s   --------- LV E/e&', lateral             20.28     --------- LV e&', medial               2.41 cm/s   --------- LV E/e&', medial              41.49     --------- LV e&', average              3.67 cm/s   --------- LV E/e&', average             27.25     ---------  Ventricular septum            Value     Reference IVS thickness, ED             10.2 mm    ---------  LVOT                   Value     Reference LVOT ID, S                21  mm    --------- LVOT area                 3.46 cm^2   --------- LVOT  peak velocity, S           60.9 cm/s   --------- LVOT mean velocity, S           41.7 cm/s   --------- LVOT VTI, S                14.4 cm    ---------  Aortic valve               Value     Reference Aortic valve peak velocity, S       302  cm/s   --------- Aortic valve mean velocity, S       232  cm/s   --------- Aortic valve VTI, S            56.1 cm    --------- Aortic mean gradient, S          24  mm Hg  --------- Aortic peak gradient, S          36  mm Hg  --------- VTI ratio, LVOT/AV             0.26      --------- Aortic valve area, VTI          0.97 cm^2   --------- Aortic valve area/bsa, VTI        0.65 cm^2/m^2 --------- Velocity ratio, peak, LVOT/AV       0.2      --------- Aortic valve area, peak velocity     0.74 cm^2   --------- Aortic valve area/bsa, peak        0.49 cm^2/m^2 --------- velocity Velocity ratio, mean, LVOT/AV       0.18      --------- Aortic valve area, mean velocity     0.62 cm^2   --------- Aortic valve area/bsa, mean        0.41 cm^2/m^2 --------- velocity Aortic regurg pressure half-time     294  ms    ---------  Aorta                   Value     Reference Aortic root ID, ED            38  mm    ---------  Left atrium                Value     Reference LA ID, A-P, ES              32  mm    --------- LA ID/bsa, A-P              2.13 cm/m^2  <=2.2 LA volume, ES, 1-p A4C          39  ml    --------- LA volume/bsa, ES, 1-p A4C        26  ml/m^2  --------- LA volume, ES, 1-p A2C          61  ml    --------- LA volume/bsa, ES, 1-p A2C        40.6 ml/m^2  ---------  Mitral valve               Value     Reference Mitral E-wave peak velocity        100  cm/s   --------- Mitral A-wave peak velocity        44.9 cm/s   --------- Mitral deceleration time     (L)   102  ms  150 - 230 Mitral peak gradient, D          4   mm Hg  --------- Mitral E/A ratio, peak          2.2      ---------  Pulmonary arteries            Value     Reference PA pressure, S, DP        (H)   44  mm Hg  <=30  Tricuspid valve              Value     Reference Tricuspid regurg  peak velocity      322  cm/s   --------- Tricuspid peak RV-RA gradient       41  mm Hg  --------- Tricuspid maximal regurg         322  cm/s   --------- velocity, PISA  Systemic veins              Value     Reference Estimated CVP               3   mm Hg  ---------  Right ventricle              Value     Reference RV pressure, S, DP        (H)   44  mm Hg  <=30 RV s&', lateral, S             11.5 cm/s   ---------  Legend: (L) and (H) mark values outside specified reference range.  ------------------------------------------------------------------- Prepared and Electronically Authenticated by  Candee Furbish, M.D. 2015-09-21T14:12:28   Cardiac Catheterization Procedure Note  Name: ZENYA HICKAM MRN: [id number] DOB: June 19, 1946  Procedure: Right Heart Cath, Left Heart Cath, Selective Coronary Angiography, LV angiography  Indication: Cardiomyopathy with an EF newly found to be 35% and anterior hypokinesis, AS/AI  Procedural Details: The right groin was prepped, draped, and anesthetized with 1% lidocaine. Using the modified Seldinger technique a 5 French sheath was placed in the right femoral artery and a 7 French sheath was placed in the right femoral vein. A Swan-Ganz catheter was used for the right heart catheterization. Standard protocol was followed for recording of right heart pressures and sampling of oxygen saturations. Fick cardiac output was calculated. Standard Judkins catheters were used for selective coronary angiography and left ventriculography. There were no immediate procedural complications. The patient was transferred to the post catheterization recovery area for further monitoring.  Procedural Findings:  Hemodynamics:  RA 15 RV  57/17 PA 59/28 (45) PCWP Mean 36 LV 157/22 EDP 32 AO 148/88 AoV Gradient mean 12.7  AoV Area 0.84 cm2   Oxygen saturations: PA 53% AO 90%  Cardiac Output (Fick) 3.03 Cardiac Index (Fick) 2.03  Coronary angiography:  Coronary dominance: right  Left mainstem: Normal  Left anterior descending (LAD): Wraps the apex. Mild mid 25% stenosis. D1 is large with luminal irregularities.   Left circumflex (LCx): AV groove proximal 30% after large MOM. MOM large branching and normal. OM2 moderate sized and normal.  Right coronary artery (RCA): High anterior take up best approached with an Amplatz left catheter. Normal.   Left ventriculography: Left ventricle was not injected secondary to high EDP. I did cross the valve for pressures. Despite significant difficulty crossing the valve there was not a high measured gradient.  AO Root: Mild AI. There did appear to be aortic root dilatation given the small size  of the patient.   Final Conclusions: Mild coronary plaque. LV gradient mild but might be underestimated given estimate of LV function on echo. Mild AI by cath. Elevated EDP and significantly elevated pulmonary pressures. O2 sats were low during the procedure.   Recommendations: I will order PFTs. She will need to see pulmonary. She needs to stop smoking. I will consider a TEE after this to further evaluate her valve. She would benefit from diuresis and salt restriction.    Minus Breeding 09/23/2014, 11:15 AM       *[facility]*            [address],  [place] [address]              [phone]  ------------------------------------------------------------------- Stress Echocardiography  Patient:  Ethelyne, Erich MR #:    [id number] Study Date: 11/22/2014 Gender:   F Age:    75 Height:   154.9 cm Weight:   49.9 kg BSA:    1.47 m^2 Pt. Status: Room:  ATTENDING  Grundy Hochrein SONOGRAPHER Cindy Hazy, RDCS PERFORMING  [facility], Outpatient  cc:  ------------------------------------------------------------------- LV EF: 30% -  35%  ------------------------------------------------------------------- Indications:   I 35.0 Aortic Stenosis. Dobutamine Stress Echo to Evaluate AS  ------------------------------------------------------------------- History:  PMH: Pulmonary Hypertension. Bicuspid Aortic Valve. COPD. Aortic Aneurysm. Medications: No other medications.  ------------------------------------------------------------------- Study Conclusions  - HPI and indications: I 35.0 Aortic Stenosis. Dobutamine Stress Echo to Evaluate AS - Left ventricle: Systolic function was moderately to severely reduced. The estimated ejection fraction was in the range of 30% to 35%. - Aortic valve: Moderately calcified with reduced leaflet excursion. Mild regurgitation. Peak and mean gradients at rest were 53 and 33 mmHg, respectively. Based on an LVOT diameter of 2.1 cm, the calculated AVA is 0.8-0.9 cm2, suggesting severe aortic stenosis. There was a mild drop in transvalvular gradient on low dose dobutamine at 5 ug/kg/min. At 10 ug/kg/min infusion, the peak and mean gradients were 63 and 46 mmHg. At 20 ug/kg/min, the gradient increased to 76 and 52 mmHg, respectively.  Impressions:  - Findings consistent with low gradient, low flow - true aortic stenosis. There is contractile reserve and LV gradients  increased with dobutamine, suggesting that the aortic stenosis is severe. LV function will likely improve with aortic valve replacement.  Dobutamine. Stress echocardiography. 2D. Birthdate: Patient birthdate: 1946/04/08. Age: Patient is 68 yr old. Sex: Gender: female.  BMI: 20.8 kg/m^2. Blood pressure:   122/76 Patient status: Outpatient. Study date: Study date: 11/22/2014. Study time: 03:26 PM.  -------------------------------------------------------------------  ------------------------------------------------------------------- Left ventricle: Systolic function was moderately to severely reduced. The estimated ejection fraction was in the range of 30% to 35%.  ------------------------------------------------------------------- Aortic valve: Moderately calcified with reduced leaflet excursion. Mild regurgitation. Peak and mean gradients at rest were 53 and 33 mmHg, respectively. Based on an LVOT diameter of 2.1 cm, the calculated AVA is 0.8-0.9 cm2, suggesting severe aortic stenosis. There was a mild drop in transvalvular gradient on low dose dobutamine at 5 ug/kg/min. At 10 ug/kg/min infusion, the peak and mean gradients were 63 and 46 mmHg. At 20 ug/kg/min, the gradient increased to 76 and 52 mmHg, respectively. Doppler:   VTI ratio of LVOT to aortic valve: 0.27. Valve area (VTI): 0.93 cm^2. Indexed valve area (VTI): 0.63 cm^2/m^2. Mean velocity ratio of LVOT to aortic valve: 0.21. Valve area (Vmean): 0.71  cm^2. Indexed valve area (Vmean): 0.49 cm^2/m^2.  Mean gradient (S): 42 mm Hg.  ------------------------------------------------------------------- Stress protocol:  +-----------------------+---+------------+---+--------+ Stage         HR BP (mmHg)  SatSymptoms +-----------------------+---+------------+---+--------+ Baseline        105122/76 (91) 98%None    +-----------------------+---+------------+---+--------+ Dobutamine 5 ug/kg/min 88 143/83 (103)94%None   +-----------------------+---+------------+---+--------+ Dobutamine 10 ug/kg/min89 144/79 (101)89%None   +-----------------------+---+------------+---+--------+ Dobutamine 20 ug/kg/min111154/78 (103)94%None   +-----------------------+---+------------+---+--------+ Immediate post stress 104163/83 (110)94%None   +-----------------------+---+------------+---+--------+ Recovery; 1 min    103---------------None   +-----------------------+---+------------+---+--------+ Recovery; 2 min    104---------------None   +-----------------------+---+------------+---+--------+ Recovery; 3 min    99 156/84 (108)92%None   +-----------------------+---+------------+---+--------+ Recovery; 4 min    96 ---------------None   +-----------------------+---+------------+---+--------+ Recovery; 5 min    95 142/82 (102)92%None   +-----------------------+---+------------+---+--------+ Recovery; 6 min    92 ---------------None   +-----------------------+---+------------+---+--------+ Recovery; 7 min    86 144/82 (103)93%None   +-----------------------+---+------------+---+--------+  ------------------------------------------------------------------- Stress results:  Maximal heart rate during stress was 111 bpm (73% of maximal predicted heart rate). The maximal predicted heart rate was 153 bpm. There was a normal resting blood pressure. Normal blood pressure response to dobutamine. The rate-pressure product for the peak heart rate and blood pressure was 17094 mm Hg/min.  ------------------------------------------------------------------- Measurements  Left ventricle              Value Stroke volume, 2D            64  ml Stroke volume/bsa, 2D           44  ml/m^2  LVOT                   Value LVOT ID, S                21  mm LVOT area                3.46 cm^2 LVOT mean velocity, S          61.9 cm/s LVOT VTI, S               18.6 cm  Aortic valve               Value Aortic valve mean velocity, S      301  cm/s Aortic valve VTI, S           68.9 cm Aortic mean gradient, S         42  mm Hg VTI ratio, LVOT/AV            0.27 Aortic valve area, VTI          0.93 cm^2 Aortic valve area/bsa, VTI        0.63 cm^2/m^2 Velocity ratio, mean, LVOT/AV      0.21 Aortic valve area, mean velocity     0.71 cm^2 Aortic valve area/bsa, mean velocity   0.49 cm^2/m^2 Aortic regurg pressure half-time     269  ms  Legend: (L) and (H) mark values outside specified reference range.  ------------------------------------------------------------------- Prepared and Electronically Authenticated by  Lyman Bishop MD 2015-11-24T17:53:41  CLINICAL DATA: Bicuspid aortic valve  EXAM: CT ANGIOGRAPHY CHEST WITH CONTRAST  TECHNIQUE: Multidetector CT imaging of the chest was performed using the standard protocol during bolus administration of intravenous contrast. Multiplanar CT image reconstructions and MIPs were obtained to evaluate the vascular anatomy.  CONTRAST: 68mL OMNIPAQUE IOHEXOL 350 MG/ML SOLN  COMPARISON: 06/05/2012  FINDINGS: Maximal aortic diameter is at the sinus of  Valsalva, sino-tubular junction, and ascending aorta are 4.7 cm, 3.5 cm, and 4.0 cm respectively.  No evidence of aortic dissection or intramural hematoma.  Mild atherosclerotic calcifications in the arch. LAD and circumflex coronary artery calcifications. Moderate aortic valve calcifications.  Innominate artery, right subclavian artery, right common carotid artery,  diminutive right vertebral artery, left common carotid artery, left subclavian artery, and dominant left vertebral artery are patent.  No obvious filling defect in the pulmonary arterial tree to suggest acute pulmonary thromboembolism.  Mild diffuse atherosclerotic plaque involving the descending thoracic aorta.  No abnormal mediastinal adenopathy. No pericardial effusion. Small mediastinal nodes are present.  No pneumothorax. No pleural effusion.  Linear atelectasis at the left lung base. Mild emphysema.  No destructive bone lesion.  Visualized upper abdomen is benign. Benign right adrenal adenoma is noted.  Review of the MIP images confirms the above findings.   Electronically Signed  By: Maryclare Bean M.D.  On: 06/30/2014 12:20     Ref Range 5mo ago    FVC-Pre L 2.28   FVC-%Pred-Pre % 84   FVC-Post L 2.21   FVC-%Pred-Post % 81   FVC-%Change-Post % -3   FEV1-Pre L 1.86   FEV1-%Pred-Pre % 90   FEV1-Post L 1.85   FEV1-%Pred-Post % 89   FEV1-%Change-Post % 0   FEV6-Pre L 2.28   FEV6-%Pred-Pre % 87   FEV6-Post L 2.20   FEV6-%Pred-Post % 84   FEV6-%Change-Post % -3   Pre FEV1/FVC ratio % 82   FEV1FVC-%Pred-Pre % 107   Post FEV1/FVC ratio % 84   FEV1FVC-%Change-Post % 2   Pre FEV6/FVC Ratio % 100   FEV6FVC-%Pred-Pre % 103   Post FEV6/FVC ratio % 100   FEV6FVC-%Pred-Post % 103   FEV6FVC-%Change-Post % 0   FEF 25-75 Pre L/sec 1.93   FEF2575-%Pred-Pre % 104   FEF 25-75 Post L/sec 1.89   FEF2575-%Pred-Post % 102   FEF2575-%Change-Post % -2   RV L 1.59   RV % pred % 79   TLC L 4.07   TLC % pred % 88   DLCO unc ml/min/mmHg 13.38   DLCO unc % pred % 66   DL/VA ml/min/mmHg/L 3.19   DL/VA % pred % 72   Resulting Agency [facility]    Specimen Collected: 11/09/14 11:57 AM Last Resulted: 11/09/14 12:43 PM       Impression:  She has low gradient severe bicuspid aortic stenosis with moderate to severe LV  dysfunction that is symptomatic with exertional fatigue and shortness of breath. She also has an aortic root and ascending aortic aneurysm with a maximum diameter at the sinus portion of 4.7 cm. I think aortic valve and root replacement with replacement of the ascending aorta is indicated to relieve her symptoms and to prevent progressive LV deterioration. 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 She needs to have any significant problems with her teeth taken care of first to prevent prosthetic valve endocarditis. I discussed the operative procedure with the patient  including alternatives, benefits and risks; including but not limited to bleeding, blood transfusion, infection, stroke, myocardial infarction, graft failure, heart block requiring a permanent pacemaker, organ dysfunction, and death.  Bonita A Lippert understands and agrees to proceed.     Plan:  She will call to schedule aortic valve and root, ascending aortic replacement after her teeth are taken care of.

## 2015-01-04 ENCOUNTER — Other Ambulatory Visit: Payer: Self-pay | Admitting: *Deleted

## 2015-01-04 DIAGNOSIS — I350 Nonrheumatic aortic (valve) stenosis: Secondary | ICD-10-CM

## 2015-01-04 DIAGNOSIS — I712 Thoracic aortic aneurysm, without rupture, unspecified: Secondary | ICD-10-CM

## 2015-01-05 IMAGING — CR DG CHEST 2V
2 series · 2 of 2 positions shown · non-contrast
Comparison: 06/08/2013

CLINICAL DATA: COPD mixed type

EXAM:
CHEST  2 VIEW

[view not recorded (1 of 2)]
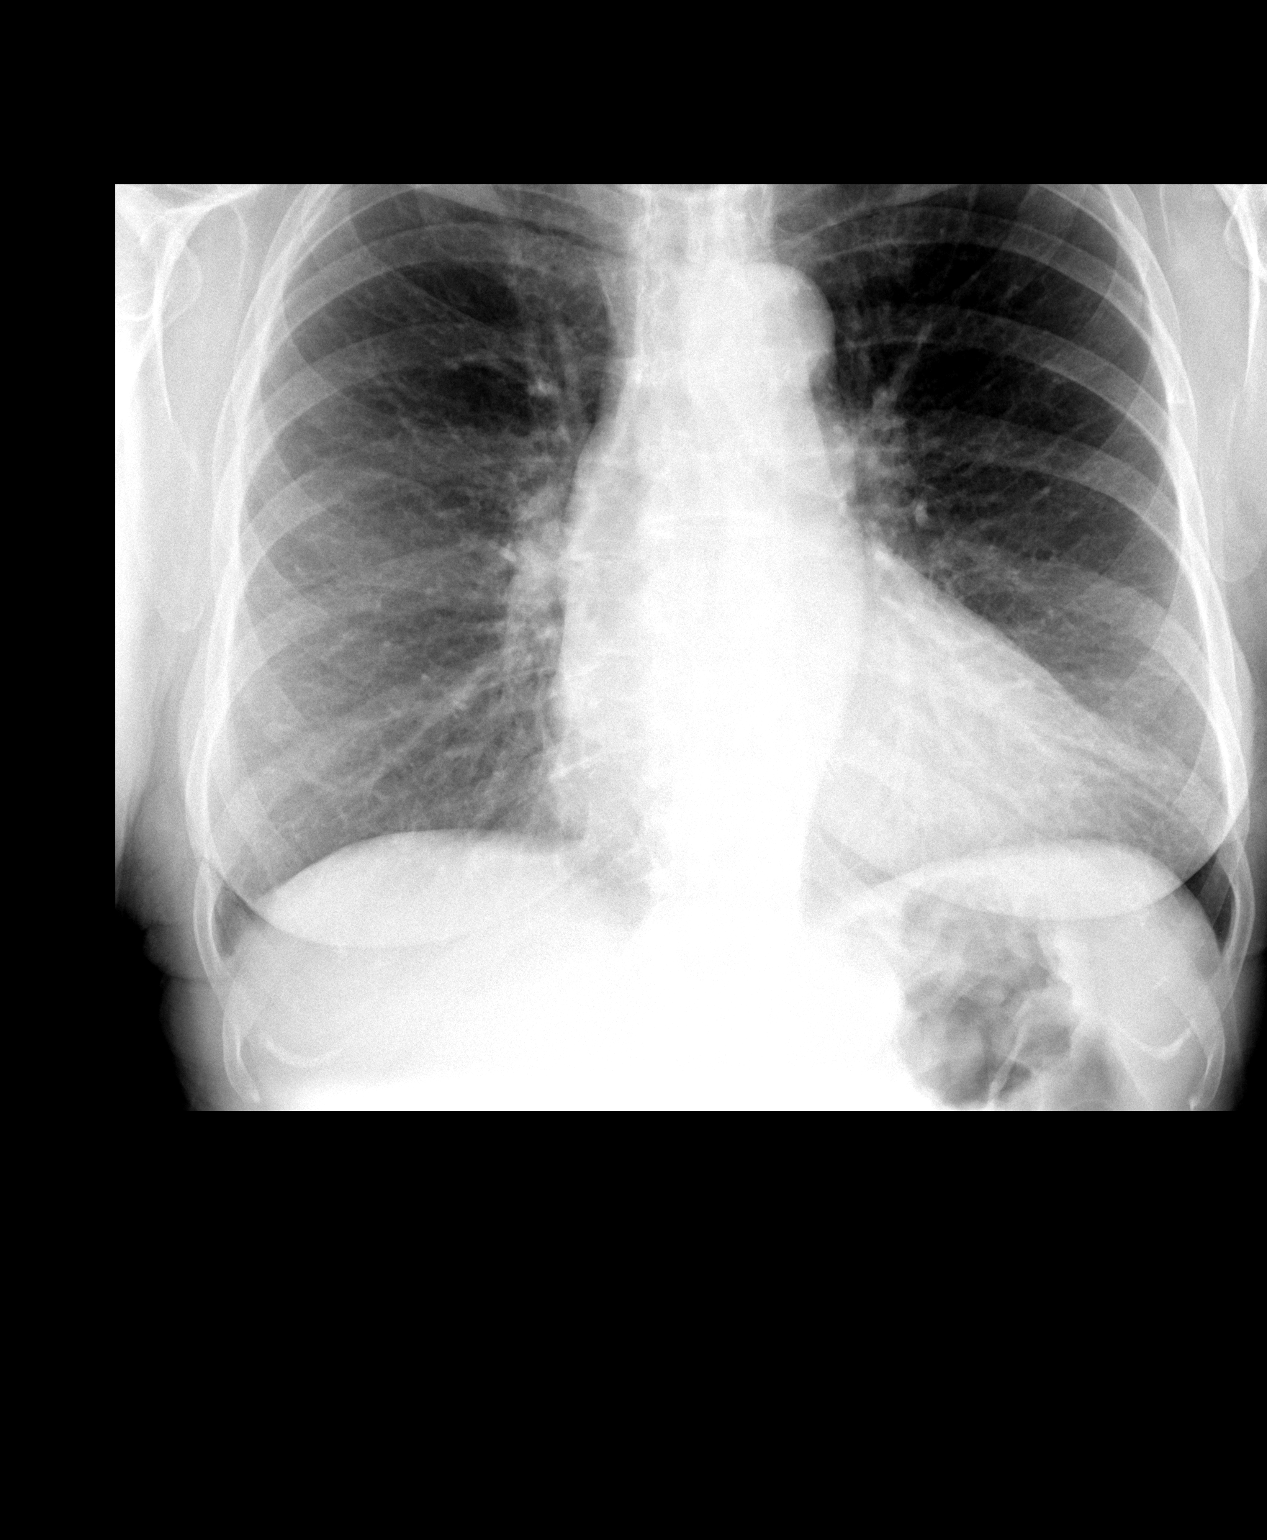

[view not recorded (2 of 2)]
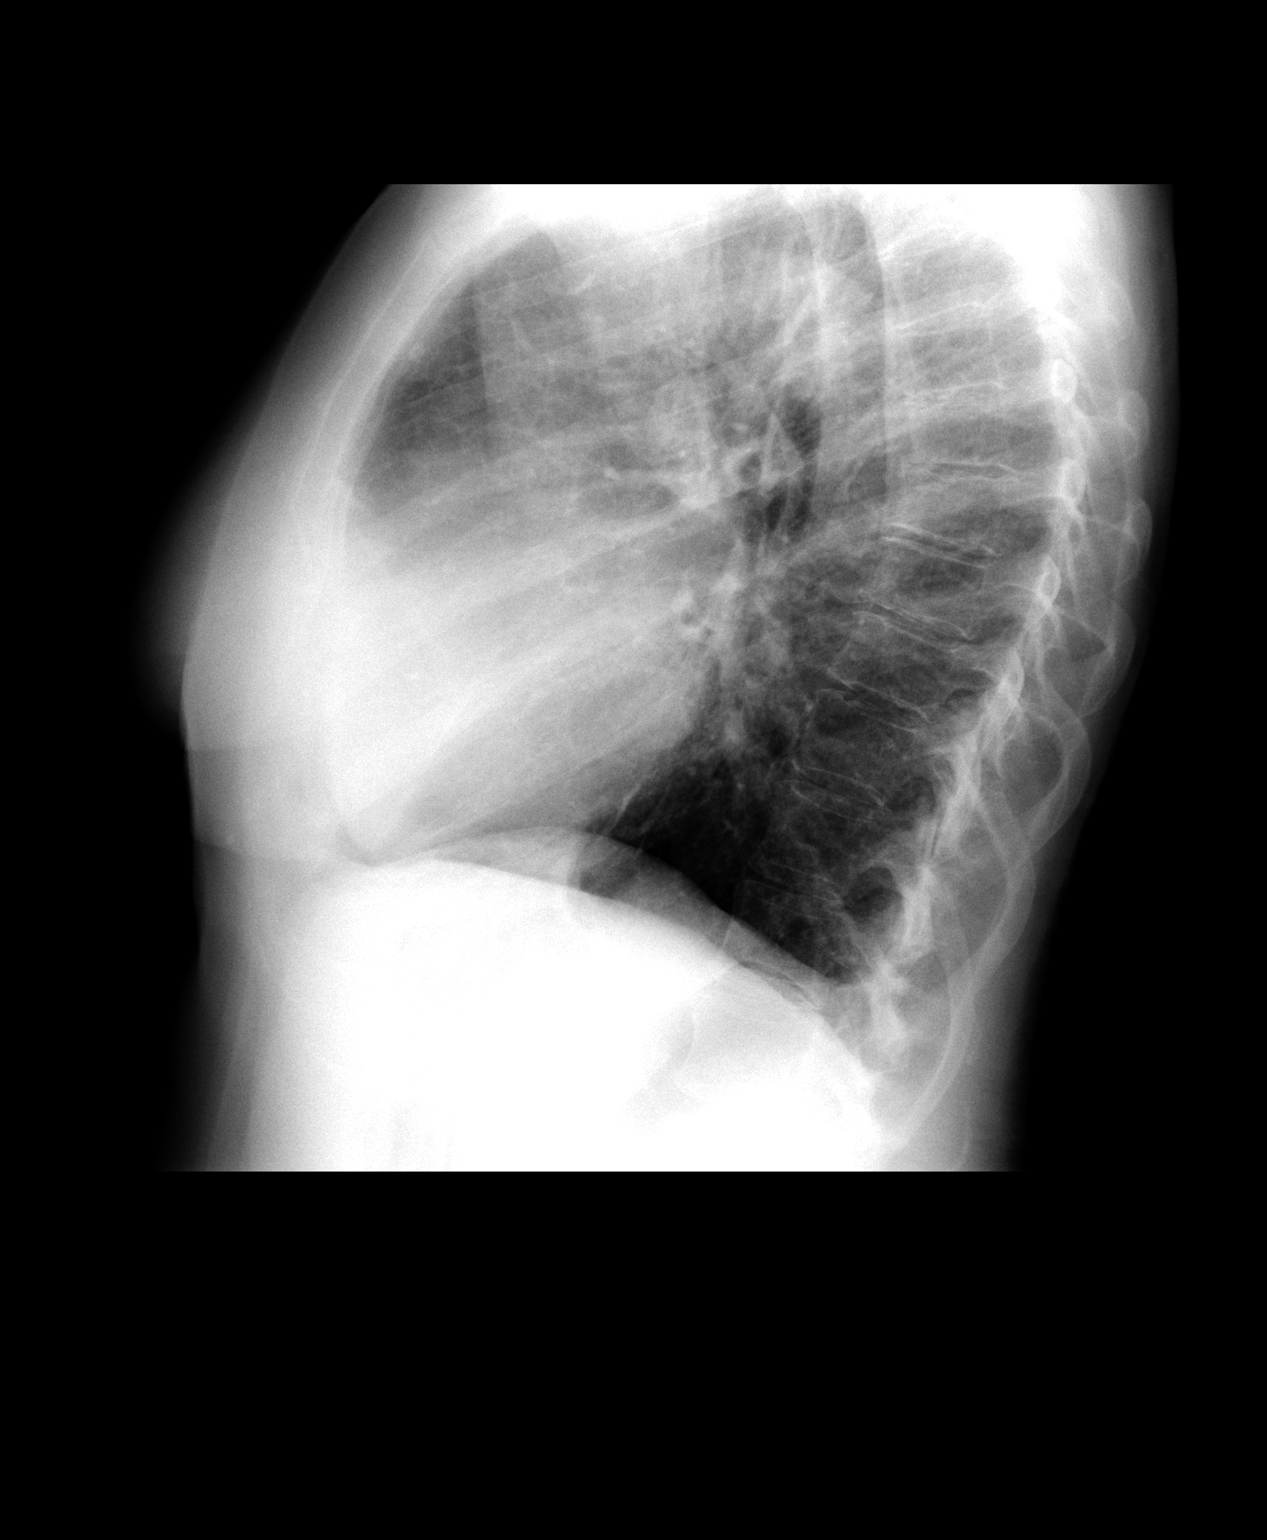

[2 of 2 positions shown; findings below may reference images not displayed]

FINDINGS: Cardiac enlargement without heart failure.

COPD with pulmonary hyperinflation. Lungs are clear without
infiltrate or mass lesion.
IMPRESSION: No active cardiopulmonary disease.

## 2015-01-10 ENCOUNTER — Ambulatory Visit (INDEPENDENT_AMBULATORY_CARE_PROVIDER_SITE_OTHER): Payer: Medicare Other | Admitting: Surgery

## 2015-01-10 ENCOUNTER — Encounter: Payer: Self-pay | Admitting: Surgery

## 2015-01-10 VITALS — BP 140/90 | HR 110 | Resp 20 | Ht 61.0 in | Wt 113.0 lb

## 2015-01-10 DIAGNOSIS — Q2549 Other congenital malformations of aorta: Secondary | ICD-10-CM

## 2015-01-10 DIAGNOSIS — I712 Thoracic aortic aneurysm, without rupture: Secondary | ICD-10-CM

## 2015-01-10 DIAGNOSIS — I350 Nonrheumatic aortic (valve) stenosis: Secondary | ICD-10-CM

## 2015-01-10 DIAGNOSIS — I7123 Aneurysm of the descending thoracic aorta, without rupture: Secondary | ICD-10-CM

## 2015-01-10 DIAGNOSIS — Q254 Other congenital malformations of aorta: Secondary | ICD-10-CM

## 2015-01-10 NOTE — Pre-Procedure Instructions (Signed)
Bonita A Jacuinde  01/10/2015   Your procedure is scheduled on:  Thursday, January 14th  Report to [facility] Admitting at 530 AM.  Call this number if you have problems the morning of surgery: [phone]   Remember:   Do not eat food or drink liquids after midnight.   Take these medicines the morning of surgery with A SIP OF WATER: wellbutrin, prilosec, valium if needed, hydrocodone if needed, inhaler if needed   Do not wear jewelry, make-up or nail polish.  Do not wear lotions, powders, or perfume,deodorant.  Do not shave 48 hours prior to surgery. Men may shave face and neck.  Do not bring valuables to the hospital.  [facility] is not responsible  for any belongings or valuables.               Contacts, dentures or bridgework may not be worn into surgery.  Leave suitcase in the car. After surgery it may be brought to your room.  For patients admitted to the hospital, discharge time is determined by your treatment team.             Please read over the following fact sheets that you were given: Pain Booklet, Coughing and Deep Breathing, Blood Transfusion Information, MRSA Information and Surgical Site Infection Prevention  [place] - Preparing for Surgery  Before surgery, you can play an important role.  Because skin is not sterile, your skin needs to be as free of germs as possible.  You can reduce the number of germs on you skin by washing with CHG (chlorahexidine gluconate) soap before surgery.  CHG is an antiseptic cleaner which kills germs and bonds with the skin to continue killing germs even after washing.  Please DO NOT use if you have an allergy to CHG or antibacterial soaps.  If your skin becomes reddened/irritated stop using the CHG and inform your nurse when you arrive at Short Stay.  Do not shave (including legs and underarms) for at least 48 hours prior to the first CHG shower.  You may shave your face.  Please follow these instructions carefully:   1.   Shower with CHG Soap the night before surgery and the morning of Surgery.  2.  If you choose to wash your hair, wash your hair first as usual with your normal shampoo.  3.  After you shampoo, rinse your hair and body thoroughly to remove the shampoo.  4.  Use CHG as you would any other liquid soap.  You can apply CHG directly to the skin and wash gently with scrungie or a clean washcloth.  5.  Apply the CHG Soap to your body ONLY FROM THE NECK DOWN.  Do not use on open wounds or open sores.  Avoid contact with your eyes, ears, mouth and genitals (private parts).  Wash genitals (private parts) with your normal soap.  6.  Wash thoroughly, paying special attention to the area where your surgery will be performed.  7.  Thoroughly rinse your body with warm water from the neck down.  8.  DO NOT shower/wash with your normal soap after using and rinsing off the CHG Soap.  9.  Pat yourself dry with a clean towel.            10.  Wear clean pajamas.            11.  Place clean sheets on your bed the night of your first shower and do not sleep with  pets.  Day of Surgery  Do not apply any lotions/deoderants the morning of surgery.  Please wear clean clothes to the hospital/surgery center.

## 2015-01-11 ENCOUNTER — Encounter (HOSPITAL_COMMUNITY): Payer: Self-pay

## 2015-01-11 ENCOUNTER — Ambulatory Visit (HOSPITAL_COMMUNITY)
Admission: RE | Admit: 2015-01-11 | Discharge: 2015-01-11 | Disposition: A | Discharge status: Home / Self Care | Payer: Medicare Other | Source: Ambulatory Visit | Attending: Surgery | Admitting: Surgery

## 2015-01-11 ENCOUNTER — Encounter (HOSPITAL_COMMUNITY)
Admission: RE | Admit: 2015-01-11 | Discharge: 2015-01-11 | Disposition: A | Discharge status: Home / Self Care | Payer: Medicare Other | Source: Ambulatory Visit | Attending: Surgery | Admitting: Surgery

## 2015-01-11 ENCOUNTER — Ambulatory Visit (HOSPITAL_COMMUNITY): Payer: Medicare Other

## 2015-01-11 ENCOUNTER — Encounter: Payer: Self-pay | Admitting: Surgery

## 2015-01-11 VITALS — BP 146/73 | HR 108 | Temp 97.8°F | Resp 20 | Ht 61.0 in | Wt 108.1 lb

## 2015-01-11 DIAGNOSIS — I350 Nonrheumatic aortic (valve) stenosis: Secondary | ICD-10-CM

## 2015-01-11 DIAGNOSIS — I712 Thoracic aortic aneurysm, without rupture, unspecified: Secondary | ICD-10-CM

## 2015-01-11 DIAGNOSIS — Z01818 Encounter for other preprocedural examination: Secondary | ICD-10-CM | POA: Insufficient documentation

## 2015-01-11 DIAGNOSIS — Z01810 Encounter for preprocedural cardiovascular examination: Secondary | ICD-10-CM

## 2015-01-11 HISTORY — DX: Gastro-esophageal reflux disease without esophagitis: K21.9

## 2015-01-11 HISTORY — DX: Malignant (primary) neoplasm, unspecified: C80.1

## 2015-01-11 HISTORY — DX: Chronic obstructive pulmonary disease, unspecified: J44.9

## 2015-01-11 LAB — HEMOGLOBIN A1C
HEMOGLOBIN A1C: 6.2 % — AB (ref <5.7)
Mean Plasma Glucose: 131 mg/dL — ABNORMAL HIGH (ref <117)

## 2015-01-11 LAB — BLOOD GAS, ARTERIAL
Acid-Base Excess: 0 mmol/L (ref 0.0–2.0)
Bicarbonate: 23.7 mEq/L (ref 20.0–24.0)
Drawn by: 206361
FIO2: 0.21 %
O2 Saturation: 96.8 %
PCO2 ART: 35.7 mmHg (ref 35.0–45.0)
PO2 ART: 83.2 mmHg (ref 80.0–100.0)
Patient temperature: 98.6
TCO2: 24.8 mmol/L (ref 0–100)
pH, Arterial: 7.438 (ref 7.350–7.450)

## 2015-01-11 LAB — URINALYSIS, ROUTINE W REFLEX MICROSCOPIC
Bilirubin Urine: NEGATIVE
Glucose, UA: NEGATIVE mg/dL
Hgb urine dipstick: NEGATIVE
KETONES UR: NEGATIVE mg/dL
LEUKOCYTES UA: NEGATIVE
NITRITE: NEGATIVE
Protein, ur: NEGATIVE mg/dL
Specific Gravity, Urine: 1.009 (ref 1.005–1.030)
UROBILINOGEN UA: 0.2 mg/dL (ref 0.0–1.0)
pH: 6.5 (ref 5.0–8.0)

## 2015-01-11 LAB — COMPREHENSIVE METABOLIC PANEL
ALBUMIN: 4.1 g/dL (ref 3.5–5.2)
ALT: 21 U/L (ref 0–35)
ANION GAP: 12 (ref 5–15)
AST: 20 U/L (ref 0–37)
Alkaline Phosphatase: 43 U/L (ref 39–117)
BUN: 14 mg/dL (ref 6–23)
CALCIUM: 9 mg/dL (ref 8.4–10.5)
CO2: 20 mmol/L (ref 19–32)
Chloride: 98 mEq/L (ref 96–112)
Creatinine, Ser: 0.96 mg/dL (ref 0.50–1.10)
GFR calc non Af Amer: 60 mL/min — ABNORMAL LOW (ref >90)
GFR, EST AFRICAN AMERICAN: 69 mL/min — AB (ref >90)
GLUCOSE: 99 mg/dL (ref 70–99)
POTASSIUM: 4.1 mmol/L (ref 3.5–5.1)
Sodium: 130 mmol/L — ABNORMAL LOW (ref 135–145)
TOTAL PROTEIN: 6.8 g/dL (ref 6.0–8.3)
Total Bilirubin: 0.6 mg/dL (ref 0.3–1.2)

## 2015-01-11 LAB — SURGICAL PCR SCREEN
MRSA, PCR: NEGATIVE
STAPHYLOCOCCUS AUREUS: NEGATIVE

## 2015-01-11 LAB — PROTIME-INR
INR: 0.91 (ref 0.00–1.49)
Prothrombin Time: 12.4 seconds (ref 11.6–15.2)

## 2015-01-11 LAB — CBC
HCT: 37.7 % (ref 36.0–46.0)
HEMOGLOBIN: 13.3 g/dL (ref 12.0–15.0)
MCH: 30.7 pg (ref 26.0–34.0)
MCHC: 35.3 g/dL (ref 30.0–36.0)
MCV: 87.1 fL (ref 78.0–100.0)
Platelets: 313 10*3/uL (ref 150–400)
RBC: 4.33 MIL/uL (ref 3.87–5.11)
RDW: 16.3 % — ABNORMAL HIGH (ref 11.5–15.5)
WBC: 9.9 10*3/uL (ref 4.0–10.5)

## 2015-01-11 LAB — ABO/RH: ABO/RH(D): A POS

## 2015-01-11 LAB — APTT: aPTT: 32 seconds (ref 24–37)

## 2015-01-11 MED ORDER — HEPARIN SODIUM (PORCINE) 1000 UNIT/ML IJ SOLN
INTRAMUSCULAR | Status: DC
  Filled 2015-01-11: qty 30

## 2015-01-11 MED ORDER — EPINEPHRINE HCL 1 MG/ML IJ SOLN
0.0000 ug/min | INTRAVENOUS | Status: DC
  Filled 2015-01-11: qty 4

## 2015-01-11 MED ORDER — PAPAVERINE HCL 30 MG/ML IJ SOLN
INTRAMUSCULAR | Status: AC
  Administered 2015-01-12: 500 mL
  Filled 2015-01-11: qty 2.5

## 2015-01-11 MED ORDER — METOPROLOL TARTRATE 12.5 MG HALF TABLET
12.5000 mg | ORAL_TABLET | Freq: Once | ORAL | Status: AC
  Administered 2015-01-12: 12.5 mg via ORAL
  Filled 2015-01-11: qty 1

## 2015-01-11 MED ORDER — DEXMEDETOMIDINE HCL IN NACL 400 MCG/100ML IV SOLN
0.1000 ug/kg/h | INTRAVENOUS | Status: DC
  Filled 2015-01-11: qty 100

## 2015-01-11 MED ORDER — AMINOCAPROIC ACID 250 MG/ML IV SOLN
INTRAVENOUS | Status: DC
  Filled 2015-01-11: qty 40

## 2015-01-11 MED ORDER — PHENYLEPHRINE HCL 10 MG/ML IJ SOLN
30.0000 ug/min | INTRAVENOUS | Status: DC
  Filled 2015-01-11: qty 2

## 2015-01-11 MED ORDER — DEXTROSE 5 % IV SOLN
1.5000 g | INTRAVENOUS | Status: AC
  Administered 2015-01-12: 1.5 g via INTRAVENOUS
  Administered 2015-01-12: .75 g via INTRAVENOUS
  Filled 2015-01-11: qty 1.5

## 2015-01-11 MED ORDER — SODIUM CHLORIDE 0.9 % IV SOLN
INTRAVENOUS | Status: DC
  Filled 2015-01-11: qty 2.5

## 2015-01-11 MED ORDER — NITROGLYCERIN IN D5W 200-5 MCG/ML-% IV SOLN
2.0000 ug/min | INTRAVENOUS | Status: DC
  Filled 2015-01-11: qty 250

## 2015-01-11 MED ORDER — DEXTROSE 5 % IV SOLN
750.0000 mg | INTRAVENOUS | Status: DC
  Filled 2015-01-11: qty 750

## 2015-01-11 MED ORDER — MAGNESIUM SULFATE 50 % IJ SOLN
40.0000 meq | INTRAMUSCULAR | Status: DC
  Filled 2015-01-11: qty 10

## 2015-01-11 MED ORDER — VANCOMYCIN HCL 10 G IV SOLR
1250.0000 mg | INTRAVENOUS | Status: AC
  Administered 2015-01-12: 1250 mg via INTRAVENOUS
  Filled 2015-01-11 (×2): qty 1250

## 2015-01-11 MED ORDER — DOPAMINE-DEXTROSE 3.2-5 MG/ML-% IV SOLN
0.0000 ug/kg/min | INTRAVENOUS | Status: AC
  Administered 2015-01-12: 3 ug/kg/min via INTRAVENOUS
  Filled 2015-01-11: qty 250

## 2015-01-11 MED ORDER — POTASSIUM CHLORIDE 2 MEQ/ML IV SOLN
80.0000 meq | INTRAVENOUS | Status: DC
  Filled 2015-01-11: qty 40

## 2015-01-11 NOTE — Progress Notes (Signed)
HPI:  She returned to the office today to review the planned surgery for Thursday this week. She has been feeling fatigued and short of breath with exertion. She continues to smoke.  Current Outpatient Prescriptions  Medication Sig Dispense Refill  . albuterol (PROVENTIL HFA;VENTOLIN HFA) 108 (90 BASE) MCG/ACT inhaler Inhale 1-2 puffs into the lungs every 6 (six) hours as needed for wheezing or shortness of breath.    [name] albuterol (PROVENTIL) (2.5 MG/3ML) 0.083% nebulizer solution Take 2.5 mg by nebulization 2 (two) times daily as needed for wheezing.    . AMBULATORY NON FORMULARY MEDICATION Topical nasal rinse/ Tobram Budesonide Use nasal as needed    . B Complex Vitamins (VITAMIN B COMPLEX PO) Take 1 tablet by mouth daily.    . bisacodyl (DULCOLAX) 5 MG EC tablet Take 5 mg by mouth daily as needed for moderate constipation.    [name] buPROPion (WELLBUTRIN XL) 300 MG 24 hr tablet Take 300 mg by mouth daily.    . Calcium Carbonate-Vitamin D (CALCIUM-D PO) Take 1 tablet by mouth daily.     . cyanocobalamin (,VITAMIN B-12,) 1000 MCG/ML injection Inject 1,000 mcg into the muscle once a week.    . diazepam (VALIUM) 10 MG tablet Take 10 mg by mouth 3 (three) times daily as needed for anxiety.    . fluticasone (FLONASE) 50 MCG/ACT nasal spray Place 1 spray into the nose 2 (two) times daily.    . furosemide (LASIX) 20 MG tablet Take 1 tablet (20 mg total) by mouth daily. 30 tablet 6  . HYDROcodone-acetaminophen (NORCO/VICODIN) 5-325 MG per tablet Take 0.5 tablets by mouth every 4 (four) hours as needed for moderate pain.     [name] loratadine (CLARITIN) 10 MG tablet Take 10 mg by mouth daily.    [name] losartan (COZAAR) 50 MG tablet Take 1 tablet (50 mg total) by mouth daily. 30 tablet 6  . meloxicam (MOBIC) 15 MG tablet Take 15 mg by mouth daily as needed for pain.    . Menthol, Topical Analgesic, (ICY HOT EX) Apply 1 patch topically daily.    . montelukast (SINGULAIR) 10 MG tablet Take 10 mg by mouth at  bedtime.    . Multiple Vitamin (MULTIVITAMIN) capsule Take 1 capsule by mouth daily.      . Olopatadine HCl (PATANASE) 0.6 % SOLN Place 2 drops into both nostrils 2 (two) times daily.     [name] omeprazole (PRILOSEC) 20 MG capsule Take 40 mg by mouth daily.    . polyethylene glycol (MIRALAX / GLYCOLAX) packet Take 17 g by mouth daily as needed.    . Polyvinyl Alcohol-Povidone (REFRESH OP) Place 2 drops into both eyes 2 (two) times daily.    . Probiotic Product (PROBIOTIC DAILY PO) Take 1 capsule by mouth daily. Ultimate flora    . pseudoephedrine-guaifenesin (MUCINEX D) 60-600 MG per tablet Take 1 tablet by mouth as needed for congestion.    . ranitidine (ZANTAC) 300 MG capsule Take 300 mg by mouth every evening.    . Saline (SIMPLY SALINE) 0.9 % AERS Place 2 sprays into both nostrils 2 (two) times daily.    . traZODone (DESYREL) 50 MG tablet Take 50-75 mg by mouth at bedtime.      No current facility-administered medications for this visit.     Physical Exam: BP 140/90 mmHg  Pulse 110  Resp 20  Ht 5\' 1"  (1.549 m)  Wt 113 lb (51.256 kg)  BMI 21.36 kg/m2  SpO2 98% She  looks well. Cardiac exam shows a regular rate and rhythm with a harsh systolic murmur of AS Lung exam is clear There is no peripheral edema   Impression:  She has severe aortic stenosis with a 4.7 cm aortic root and 4 cm ascending aortic aneurysm. She is a small woman with a BSA of 1.47 m2 so I think the aortic enlargement is significant in this 69 year old with a bicuspid aortic valve. I reviewed the echo, cath and CTA findings again with her and her husband. I discussed the operative procedure with the patient and her husband including alternatives, benefits and risks; including but not limited to bleeding, blood transfusion, infection, stroke, myocardial infarction, heart block requiring a permanent pacemaker, organ dysfunction, and death.  Bonita A Gillen understands and agrees to proceed.    Plan:  Replacement of  aortic valve, root and ascending aorta on Thursday 01/12/2015.

## 2015-01-11 NOTE — Progress Notes (Signed)
SPOKE WITH DR. BARTLES OFFICE AND THEY STATED DR. BARTLE WAS OKAY WITH PFTS FROM November.

## 2015-01-11 NOTE — Progress Notes (Signed)
VASCULAR LAB PRELIMINARY  PRELIMINARY  PRELIMINARY  PRELIMINARY  Pre-op Cardiac Surgery  Carotid Findings:  Bilateral:  1-39% ICA stenosis.  Vertebral artery flow is antegrade.     Upper Extremity Right Left  Brachial Pressures 122  triphasic 134  triphasic  Radial Waveforms triphasic triphasic  Ulnar Waveforms triphasic triphasic  Palmar Arch (Allen's Test) normal normal   Findings:  Doppler waveforms remain normal with both radial and ulnar compression   Lindwood Coke, RVT 01/11/2015, 11:28 AM

## 2015-01-12 ENCOUNTER — Encounter (HOSPITAL_COMMUNITY): Payer: Self-pay | Admitting: *Deleted

## 2015-01-12 ENCOUNTER — Inpatient Hospital Stay (HOSPITAL_COMMUNITY): Payer: Medicare Other

## 2015-01-12 ENCOUNTER — Inpatient Hospital Stay (HOSPITAL_COMMUNITY)
Admission: RE | Admit: 2015-01-12 | Discharge: 2015-01-20 | DRG: 220 | Disposition: A | Discharge status: Home / Self Care | Payer: Medicare Other | Source: Ambulatory Visit | Attending: Surgery | Admitting: Surgery

## 2015-01-12 ENCOUNTER — Inpatient Hospital Stay (HOSPITAL_COMMUNITY): Payer: Medicare Other | Admitting: Certified Registered Nurse Anesthetist

## 2015-01-12 ENCOUNTER — Encounter (HOSPITAL_COMMUNITY)
Admission: RE | Disposition: A | Discharge status: Home / Self Care | Payer: Medicare Other | Source: Ambulatory Visit | Attending: Surgery

## 2015-01-12 DIAGNOSIS — D62 Acute posthemorrhagic anemia: Secondary | ICD-10-CM | POA: Diagnosis not present

## 2015-01-12 DIAGNOSIS — I714 Abdominal aortic aneurysm, without rupture: Secondary | ICD-10-CM

## 2015-01-12 DIAGNOSIS — Z006 Encounter for examination for normal comparison and control in clinical research program: Secondary | ICD-10-CM

## 2015-01-12 DIAGNOSIS — I712 Thoracic aortic aneurysm, without rupture, unspecified: Secondary | ICD-10-CM

## 2015-01-12 DIAGNOSIS — F17210 Nicotine dependence, cigarettes, uncomplicated: Secondary | ICD-10-CM | POA: Diagnosis present

## 2015-01-12 DIAGNOSIS — F419 Anxiety disorder, unspecified: Secondary | ICD-10-CM | POA: Diagnosis present

## 2015-01-12 DIAGNOSIS — Q231 Congenital insufficiency of aortic valve: Secondary | ICD-10-CM | POA: Diagnosis not present

## 2015-01-12 DIAGNOSIS — I447 Left bundle-branch block, unspecified: Secondary | ICD-10-CM | POA: Diagnosis present

## 2015-01-12 DIAGNOSIS — K5900 Constipation, unspecified: Secondary | ICD-10-CM | POA: Diagnosis not present

## 2015-01-12 DIAGNOSIS — I350 Nonrheumatic aortic (valve) stenosis: Secondary | ICD-10-CM | POA: Diagnosis present

## 2015-01-12 DIAGNOSIS — Z23 Encounter for immunization: Secondary | ICD-10-CM | POA: Diagnosis not present

## 2015-01-12 DIAGNOSIS — Z952 Presence of prosthetic heart valve: Secondary | ICD-10-CM

## 2015-01-12 DIAGNOSIS — I429 Cardiomyopathy, unspecified: Secondary | ICD-10-CM | POA: Diagnosis present

## 2015-01-12 DIAGNOSIS — I2510 Atherosclerotic heart disease of native coronary artery without angina pectoris: Secondary | ICD-10-CM | POA: Diagnosis present

## 2015-01-12 DIAGNOSIS — D649 Anemia, unspecified: Secondary | ICD-10-CM | POA: Diagnosis not present

## 2015-01-12 DIAGNOSIS — E538 Deficiency of other specified B group vitamins: Secondary | ICD-10-CM | POA: Diagnosis present

## 2015-01-12 DIAGNOSIS — Z95810 Presence of automatic (implantable) cardiac defibrillator: Secondary | ICD-10-CM

## 2015-01-12 DIAGNOSIS — I5022 Chronic systolic (congestive) heart failure: Secondary | ICD-10-CM | POA: Diagnosis present

## 2015-01-12 DIAGNOSIS — M8580 Other specified disorders of bone density and structure, unspecified site: Secondary | ICD-10-CM | POA: Diagnosis present

## 2015-01-12 DIAGNOSIS — J449 Chronic obstructive pulmonary disease, unspecified: Secondary | ICD-10-CM | POA: Diagnosis present

## 2015-01-12 DIAGNOSIS — J9811 Atelectasis: Secondary | ICD-10-CM | POA: Diagnosis not present

## 2015-01-12 DIAGNOSIS — J45909 Unspecified asthma, uncomplicated: Secondary | ICD-10-CM | POA: Diagnosis present

## 2015-01-12 DIAGNOSIS — R0602 Shortness of breath: Secondary | ICD-10-CM

## 2015-01-12 DIAGNOSIS — I442 Atrioventricular block, complete: Secondary | ICD-10-CM | POA: Diagnosis not present

## 2015-01-12 DIAGNOSIS — M479 Spondylosis, unspecified: Secondary | ICD-10-CM | POA: Diagnosis present

## 2015-01-12 DIAGNOSIS — F329 Major depressive disorder, single episode, unspecified: Secondary | ICD-10-CM | POA: Diagnosis present

## 2015-01-12 DIAGNOSIS — I272 Other secondary pulmonary hypertension: Secondary | ICD-10-CM | POA: Diagnosis present

## 2015-01-12 DIAGNOSIS — Z8619 Personal history of other infectious and parasitic diseases: Secondary | ICD-10-CM | POA: Diagnosis not present

## 2015-01-12 HISTORY — PX: AORTIC VALVE REPLACEMENT: SHX41

## 2015-01-12 HISTORY — PX: ASCENDING AORTIC ROOT REPLACEMENT: SHX5729

## 2015-01-12 HISTORY — PX: TEE WITHOUT CARDIOVERSION: SHX5443

## 2015-01-12 LAB — POCT I-STAT, CHEM 8
BUN: 10 mg/dL (ref 6–23)
BUN: 10 mg/dL (ref 6–23)
BUN: 8 mg/dL (ref 6–23)
BUN: 10 mg/dL (ref 6–23)
BUN: 10 mg/dL (ref 6–23)
BUN: 11 mg/dL (ref 6–23)
CALCIUM ION: 0.95 mmol/L — AB (ref 1.13–1.30)
CALCIUM ION: 1.06 mmol/L — AB (ref 1.13–1.30)
CHLORIDE: 101 meq/L (ref 96–112)
CHLORIDE: 98 meq/L (ref 96–112)
CHLORIDE: 98 meq/L (ref 96–112)
Calcium, Ion: 1.11 mmol/L — ABNORMAL LOW (ref 1.13–1.30)
Calcium, Ion: 1.16 mmol/L (ref 1.13–1.30)
Calcium, Ion: 1.07 mmol/L — ABNORMAL LOW (ref 1.13–1.30)
Calcium, Ion: 1.07 mmol/L — ABNORMAL LOW (ref 1.13–1.30)
Chloride: 102 mEq/L (ref 96–112)
Chloride: 102 mEq/L (ref 96–112)
Chloride: 105 mEq/L (ref 96–112)
Creatinine, Ser: 0.5 mg/dL (ref 0.50–1.10)
Creatinine, Ser: 0.5 mg/dL (ref 0.50–1.10)
Creatinine, Ser: 0.4 mg/dL — ABNORMAL LOW (ref 0.50–1.10)
Creatinine, Ser: 0.5 mg/dL (ref 0.50–1.10)
Creatinine, Ser: 0.4 mg/dL — ABNORMAL LOW (ref 0.50–1.10)
Creatinine, Ser: 0.6 mg/dL (ref 0.50–1.10)
GLUCOSE: 119 mg/dL — AB (ref 70–99)
GLUCOSE: 112 mg/dL — AB (ref 70–99)
GLUCOSE: 180 mg/dL — AB (ref 70–99)
Glucose, Bld: 129 mg/dL — ABNORMAL HIGH (ref 70–99)
Glucose, Bld: 103 mg/dL — ABNORMAL HIGH (ref 70–99)
Glucose, Bld: 167 mg/dL — ABNORMAL HIGH (ref 70–99)
HCT: 32 % — ABNORMAL LOW (ref 36.0–46.0)
HCT: 25 % — ABNORMAL LOW (ref 36.0–46.0)
HCT: 21 % — ABNORMAL LOW (ref 36.0–46.0)
HEMATOCRIT: 31 % — AB (ref 36.0–46.0)
HEMATOCRIT: 23 % — AB (ref 36.0–46.0)
HEMATOCRIT: 25 % — AB (ref 36.0–46.0)
HEMOGLOBIN: 8.5 g/dL — AB (ref 12.0–15.0)
Hemoglobin: 10.9 g/dL — ABNORMAL LOW (ref 12.0–15.0)
Hemoglobin: 10.5 g/dL — ABNORMAL LOW (ref 12.0–15.0)
Hemoglobin: 7.8 g/dL — ABNORMAL LOW (ref 12.0–15.0)
Hemoglobin: 8.5 g/dL — ABNORMAL LOW (ref 12.0–15.0)
Hemoglobin: 7.1 g/dL — ABNORMAL LOW (ref 12.0–15.0)
Potassium: 3.9 mmol/L (ref 3.5–5.1)
Potassium: 3.3 mmol/L — ABNORMAL LOW (ref 3.5–5.1)
Potassium: 3 mmol/L — ABNORMAL LOW (ref 3.5–5.1)
Potassium: 4.1 mmol/L (ref 3.5–5.1)
Potassium: 4.1 mmol/L (ref 3.5–5.1)
Potassium: 4.3 mmol/L (ref 3.5–5.1)
SODIUM: 133 mmol/L — AB (ref 135–145)
Sodium: 136 mmol/L (ref 135–145)
Sodium: 136 mmol/L (ref 135–145)
Sodium: 138 mmol/L (ref 135–145)
Sodium: 138 mmol/L (ref 135–145)
Sodium: 138 mmol/L (ref 135–145)
TCO2: 23 mmol/L (ref 0–100)
TCO2: 23 mmol/L (ref 0–100)
TCO2: 23 mmol/L (ref 0–100)
TCO2: 23 mmol/L (ref 0–100)
TCO2: 22 mmol/L (ref 0–100)
TCO2: 19 mmol/L (ref 0–100)

## 2015-01-12 LAB — PROTIME-INR
INR: 1.41 (ref 0.00–1.49)
Prothrombin Time: 17.4 seconds — ABNORMAL HIGH (ref 11.6–15.2)

## 2015-01-12 LAB — CBC
HCT: 29.7 % — ABNORMAL LOW (ref 36.0–46.0)
HCT: 25.2 % — ABNORMAL LOW (ref 36.0–46.0)
HEMOGLOBIN: 8.7 g/dL — AB (ref 12.0–15.0)
Hemoglobin: 10.4 g/dL — ABNORMAL LOW (ref 12.0–15.0)
MCH: 30.5 pg (ref 26.0–34.0)
MCH: 30.1 pg (ref 26.0–34.0)
MCHC: 35 g/dL (ref 30.0–36.0)
MCHC: 34.5 g/dL (ref 30.0–36.0)
MCV: 87.1 fL (ref 78.0–100.0)
MCV: 87.2 fL (ref 78.0–100.0)
PLATELETS: 129 10*3/uL — AB (ref 150–400)
Platelets: 167 10*3/uL (ref 150–400)
RBC: 3.41 MIL/uL — AB (ref 3.87–5.11)
RBC: 2.89 MIL/uL — AB (ref 3.87–5.11)
RDW: 16.4 % — ABNORMAL HIGH (ref 11.5–15.5)
RDW: 16.4 % — ABNORMAL HIGH (ref 11.5–15.5)
WBC: 16.1 10*3/uL — ABNORMAL HIGH (ref 4.0–10.5)
WBC: 10.9 10*3/uL — ABNORMAL HIGH (ref 4.0–10.5)

## 2015-01-12 LAB — GLUCOSE, CAPILLARY
GLUCOSE-CAPILLARY: 58 mg/dL — AB (ref 70–99)
GLUCOSE-CAPILLARY: 126 mg/dL — AB (ref 70–99)
Glucose-Capillary: 62 mg/dL — ABNORMAL LOW (ref 70–99)
Glucose-Capillary: 129 mg/dL — ABNORMAL HIGH (ref 70–99)
Glucose-Capillary: 141 mg/dL — ABNORMAL HIGH (ref 70–99)
Glucose-Capillary: 186 mg/dL — ABNORMAL HIGH (ref 70–99)

## 2015-01-12 LAB — POCT I-STAT 3, ART BLOOD GAS (G3+)
ACID-BASE DEFICIT: 4 mmol/L — AB (ref 0.0–2.0)
ACID-BASE DEFICIT: 3 mmol/L — AB (ref 0.0–2.0)
ACID-BASE EXCESS: 3 mmol/L — AB (ref 0.0–2.0)
Acid-base deficit: 3 mmol/L — ABNORMAL HIGH (ref 0.0–2.0)
BICARBONATE: 22.3 meq/L (ref 20.0–24.0)
BICARBONATE: 22 meq/L (ref 20.0–24.0)
Bicarbonate: 26.6 mEq/L — ABNORMAL HIGH (ref 20.0–24.0)
Bicarbonate: 22.6 mEq/L (ref 20.0–24.0)
O2 SAT: 94 %
O2 Saturation: 100 %
O2 Saturation: 99 %
O2 Saturation: 98 %
PCO2 ART: 34 mmHg — AB (ref 35.0–45.0)
PCO2 ART: 41.4 mmHg (ref 35.0–45.0)
PH ART: 7.316 — AB (ref 7.350–7.450)
PH ART: 7.343 — AB (ref 7.350–7.450)
PO2 ART: 70 mmHg — AB (ref 80.0–100.0)
PO2 ART: 117 mmHg — AB (ref 80.0–100.0)
Patient temperature: 35.2
Patient temperature: 36.6
Patient temperature: 36.5
TCO2: 28 mmol/L (ref 0–100)
TCO2: 24 mmol/L (ref 0–100)
TCO2: 24 mmol/L (ref 0–100)
TCO2: 23 mmol/L (ref 0–100)
pCO2 arterial: 42.8 mmHg (ref 35.0–45.0)
pCO2 arterial: 39.5 mmHg (ref 35.0–45.0)
pH, Arterial: 7.501 — ABNORMAL HIGH (ref 7.350–7.450)
pH, Arterial: 7.352 (ref 7.350–7.450)
pO2, Arterial: 303 mmHg — ABNORMAL HIGH (ref 80.0–100.0)
pO2, Arterial: 135 mmHg — ABNORMAL HIGH (ref 80.0–100.0)

## 2015-01-12 LAB — POCT I-STAT 4, (NA,K, GLUC, HGB,HCT)
Glucose, Bld: 102 mg/dL — ABNORMAL HIGH (ref 70–99)
HCT: 30 % — ABNORMAL LOW (ref 36.0–46.0)
Hemoglobin: 10.2 g/dL — ABNORMAL LOW (ref 12.0–15.0)
Potassium: 3.5 mmol/L (ref 3.5–5.1)
Sodium: 137 mmol/L (ref 135–145)

## 2015-01-12 LAB — APTT: APTT: 44 s — AB (ref 24–37)

## 2015-01-12 LAB — HEMOGLOBIN AND HEMATOCRIT, BLOOD
HCT: 22.8 % — ABNORMAL LOW (ref 36.0–46.0)
HEMOGLOBIN: 8 g/dL — AB (ref 12.0–15.0)

## 2015-01-12 LAB — MAGNESIUM: MAGNESIUM: 3.3 mg/dL — AB (ref 1.5–2.5)

## 2015-01-12 LAB — CREATININE, SERUM
CREATININE: 0.67 mg/dL (ref 0.50–1.10)
GFR, EST NON AFRICAN AMERICAN: 89 mL/min — AB (ref >90)

## 2015-01-12 LAB — FIBRINOGEN: Fibrinogen: 159 mg/dL — ABNORMAL LOW (ref 204–475)

## 2015-01-12 LAB — PREPARE RBC (CROSSMATCH)

## 2015-01-12 LAB — POCT I-STAT GLUCOSE
GLUCOSE: 112 mg/dL — AB (ref 70–99)
Glucose, Bld: 137 mg/dL — ABNORMAL HIGH (ref 70–99)
OPERATOR ID: 3406
Operator id: 3406

## 2015-01-12 LAB — PLATELET COUNT: Platelets: 148 10*3/uL — ABNORMAL LOW (ref 150–400)

## 2015-01-12 SURGERY — REPLACEMENT, AORTIC VALVE, OPEN
Anesthesia: General | Site: Chest

## 2015-01-12 MED ORDER — SUFENTANIL CITRATE 50 MCG/ML IV SOLN
INTRAVENOUS | Status: DC | PRN
  Administered 2015-01-12: 10 ug via INTRAVENOUS
  Administered 2015-01-12: 30 ug via INTRAVENOUS
  Administered 2015-01-12: 10 ug via INTRAVENOUS
  Administered 2015-01-12: 30 ug via INTRAVENOUS
  Administered 2015-01-12: 20 ug via INTRAVENOUS
  Administered 2015-01-12: 10 ug via INTRAVENOUS

## 2015-01-12 MED ORDER — PROTAMINE SULFATE 10 MG/ML IV SOLN
INTRAVENOUS | Status: DC | PRN
  Administered 2015-01-12: 150 mg via INTRAVENOUS

## 2015-01-12 MED ORDER — DEXTROSE 50 % IV SOLN
INTRAVENOUS | Status: AC
  Administered 2015-01-12: 17 mL
  Filled 2015-01-12: qty 50

## 2015-01-12 MED ORDER — NITROGLYCERIN IN D5W 200-5 MCG/ML-% IV SOLN: 0.0000 ug/min | INTRAVENOUS | Status: DC

## 2015-01-12 MED ORDER — SUFENTANIL CITRATE 250 MCG/5ML IV SOLN
INTRAVENOUS | Status: AC
  Filled 2015-01-12: qty 5

## 2015-01-12 MED ORDER — DEXTROSE 5 % IV SOLN
0.0000 ug/min | INTRAVENOUS | Status: DC
  Filled 2015-01-12: qty 2

## 2015-01-12 MED ORDER — MIDAZOLAM HCL 5 MG/5ML IJ SOLN
INTRAMUSCULAR | Status: DC | PRN
  Administered 2015-01-12 (×2): 3 mg via INTRAVENOUS
  Administered 2015-01-12 (×2): 2 mg via INTRAVENOUS

## 2015-01-12 MED ORDER — HEPARIN SODIUM (PORCINE) 1000 UNIT/ML IJ SOLN
INTRAMUSCULAR | Status: DC | PRN
  Administered 2015-01-12: 15000 [IU] via INTRAVENOUS
  Administered 2015-01-12: 5000 [IU] via INTRAVENOUS

## 2015-01-12 MED ORDER — INSULIN REGULAR HUMAN 100 UNIT/ML IJ SOLN
250.0000 [IU] | INTRAMUSCULAR | Status: DC | PRN
  Administered 2015-01-12: 1.8 [IU]/h via INTRAVENOUS

## 2015-01-12 MED ORDER — MIDAZOLAM HCL 10 MG/2ML IJ SOLN
INTRAMUSCULAR | Status: AC
  Filled 2015-01-12: qty 2

## 2015-01-12 MED ORDER — PROTAMINE SULFATE 10 MG/ML IV SOLN
INTRAVENOUS | Status: AC
  Filled 2015-01-12: qty 25

## 2015-01-12 MED ORDER — VECURONIUM BROMIDE 10 MG IV SOLR
INTRAVENOUS | Status: AC
  Filled 2015-01-12: qty 10

## 2015-01-12 MED ORDER — HEMOSTATIC AGENTS (NO CHARGE) OPTIME
TOPICAL | Status: DC | PRN
  Administered 2015-01-12: 1 via TOPICAL

## 2015-01-12 MED ORDER — LACTATED RINGERS IV SOLN
INTRAVENOUS | Status: DC | PRN
  Administered 2015-01-12: 07:00:00 via INTRAVENOUS

## 2015-01-12 MED ORDER — ALBUMIN HUMAN 5 % IV SOLN
250.0000 mL | INTRAVENOUS | Status: AC | PRN
  Administered 2015-01-12 (×3): 250 mL via INTRAVENOUS
  Filled 2015-01-12: qty 250

## 2015-01-12 MED ORDER — SODIUM CHLORIDE 0.45 % IV SOLN
INTRAVENOUS | Status: DC
  Administered 2015-01-12: 20 mL/h via INTRAVENOUS

## 2015-01-12 MED ORDER — BISACODYL 10 MG RE SUPP: 10.0000 mg | Freq: Every day | RECTAL | Status: DC

## 2015-01-12 MED ORDER — POTASSIUM CHLORIDE 10 MEQ/50ML IV SOLN
10.0000 meq | INTRAVENOUS | Status: AC
  Administered 2015-01-12 (×3): 10 meq via INTRAVENOUS

## 2015-01-12 MED ORDER — METHYLPREDNISOLONE SODIUM SUCC 125 MG IJ SOLR
INTRAMUSCULAR | Status: AC
  Filled 2015-01-12: qty 2

## 2015-01-12 MED ORDER — LACTATED RINGERS IV SOLN
INTRAVENOUS | Status: DC
  Administered 2015-01-13: 20:00:00 via INTRAVENOUS

## 2015-01-12 MED ORDER — ACETAMINOPHEN 160 MG/5ML PO SOLN
1000.0000 mg | Freq: Four times a day (QID) | ORAL | Status: AC
  Filled 2015-01-12: qty 40

## 2015-01-12 MED ORDER — INSULIN ASPART 100 UNIT/ML ~~LOC~~ SOLN: 0.0000 [IU] | SUBCUTANEOUS | Status: DC

## 2015-01-12 MED ORDER — VECURONIUM BROMIDE 10 MG IV SOLR
INTRAVENOUS | Status: DC | PRN
  Administered 2015-01-12: 8 mg via INTRAVENOUS
  Administered 2015-01-12 (×2): 2 mg via INTRAVENOUS

## 2015-01-12 MED ORDER — LACTATED RINGERS IV SOLN: 500.0000 mL | Freq: Once | INTRAVENOUS | Status: AC | PRN

## 2015-01-12 MED ORDER — MAGNESIUM SULFATE 4 GM/100ML IV SOLN
4.0000 g | Freq: Once | INTRAVENOUS | Status: AC
  Administered 2015-01-12: 4 g via INTRAVENOUS
  Filled 2015-01-12: qty 100

## 2015-01-12 MED ORDER — TRAMADOL HCL 50 MG PO TABS
50.0000 mg | ORAL_TABLET | ORAL | Status: DC | PRN
  Administered 2015-01-13 – 2015-01-14 (×4): 100 mg via ORAL
  Filled 2015-01-12 (×4): qty 2

## 2015-01-12 MED ORDER — OXYCODONE HCL 5 MG PO TABS
5.0000 mg | ORAL_TABLET | ORAL | Status: DC | PRN
  Administered 2015-01-12 – 2015-01-13 (×3): 10 mg via ORAL
  Filled 2015-01-12 (×3): qty 2

## 2015-01-12 MED ORDER — ALBUMIN HUMAN 5 % IV SOLN
INTRAVENOUS | Status: DC | PRN
  Administered 2015-01-12: 13:00:00 via INTRAVENOUS

## 2015-01-12 MED ORDER — GELATIN ABSORBABLE MT POWD
OROMUCOSAL | Status: DC | PRN
  Administered 2015-01-12: 1 mL via TOPICAL

## 2015-01-12 MED ORDER — DOCUSATE SODIUM 100 MG PO CAPS
200.0000 mg | ORAL_CAPSULE | Freq: Every day | ORAL | Status: DC
  Administered 2015-01-13 – 2015-01-20 (×8): 200 mg via ORAL
  Filled 2015-01-12 (×9): qty 2

## 2015-01-12 MED ORDER — PROPOFOL 10 MG/ML IV BOLUS
INTRAVENOUS | Status: DC | PRN
  Administered 2015-01-12: 110 mg via INTRAVENOUS

## 2015-01-12 MED ORDER — SODIUM CHLORIDE 0.9 % IJ SOLN: 3.0000 mL | INTRAMUSCULAR | Status: DC | PRN

## 2015-01-12 MED ORDER — ACETAMINOPHEN 160 MG/5ML PO SOLN: 650.0000 mg | Freq: Once | ORAL | Status: AC

## 2015-01-12 MED ORDER — MORPHINE SULFATE 2 MG/ML IJ SOLN
2.0000 mg | INTRAMUSCULAR | Status: DC | PRN
  Administered 2015-01-12: 4 mg via INTRAVENOUS
  Administered 2015-01-12 (×3): 2 mg via INTRAVENOUS
  Administered 2015-01-13 – 2015-01-14 (×5): 4 mg via INTRAVENOUS
  Administered 2015-01-14: 2 mg via INTRAVENOUS
  Filled 2015-01-12: qty 1
  Filled 2015-01-12 (×5): qty 2
  Filled 2015-01-12: qty 1
  Filled 2015-01-12: qty 2

## 2015-01-12 MED ORDER — PHENYLEPHRINE HCL 10 MG/ML IJ SOLN
20.0000 mg | INTRAVENOUS | Status: DC | PRN
  Administered 2015-01-12: 40 ug/min via INTRAVENOUS

## 2015-01-12 MED ORDER — METHYLPREDNISOLONE SODIUM SUCC 125 MG IJ SOLR
INTRAMUSCULAR | Status: DC | PRN
  Administered 2015-01-12: 125 mg via INTRAVENOUS

## 2015-01-12 MED ORDER — LACTATED RINGERS IV SOLN
INTRAVENOUS | Status: DC | PRN
  Administered 2015-01-12 (×2): via INTRAVENOUS

## 2015-01-12 MED ORDER — DEXMEDETOMIDINE HCL IN NACL 200 MCG/50ML IV SOLN: 0.0000 ug/kg/h | INTRAVENOUS | Status: DC

## 2015-01-12 MED ORDER — SODIUM BICARBONATE 8.4 % IV SOLN
50.0000 meq | Freq: Once | INTRAVENOUS | Status: AC
  Administered 2015-01-12: 50 meq via INTRAVENOUS
  Filled 2015-01-12: qty 50

## 2015-01-12 MED ORDER — SODIUM CHLORIDE 0.9 % IV SOLN: Freq: Once | INTRAVENOUS | Status: DC

## 2015-01-12 MED ORDER — ETOMIDATE 2 MG/ML IV SOLN
INTRAVENOUS | Status: AC
  Filled 2015-01-12: qty 10

## 2015-01-12 MED ORDER — MONTELUKAST SODIUM 10 MG PO TABS
10.0000 mg | ORAL_TABLET | Freq: Every day | ORAL | Status: DC
  Administered 2015-01-12 – 2015-01-19 (×8): 10 mg via ORAL
  Filled 2015-01-12 (×9): qty 1

## 2015-01-12 MED ORDER — SODIUM CHLORIDE 0.9 % IJ SOLN
3.0000 mL | Freq: Two times a day (BID) | INTRAMUSCULAR | Status: DC
  Administered 2015-01-13 – 2015-01-20 (×9): 3 mL via INTRAVENOUS

## 2015-01-12 MED ORDER — MORPHINE SULFATE 2 MG/ML IJ SOLN
1.0000 mg | INTRAMUSCULAR | Status: AC | PRN
  Filled 2015-01-12 (×2): qty 1

## 2015-01-12 MED ORDER — METOPROLOL TARTRATE 12.5 MG HALF TABLET
12.5000 mg | ORAL_TABLET | Freq: Two times a day (BID) | ORAL | Status: DC
Start: 2015-01-12 — End: 2015-01-13
  Filled 2015-01-12 (×3): qty 1

## 2015-01-12 MED ORDER — ARTIFICIAL TEARS OP OINT
TOPICAL_OINTMENT | OPHTHALMIC | Status: DC | PRN
  Administered 2015-01-12: 1 via OPHTHALMIC

## 2015-01-12 MED ORDER — DEXMEDETOMIDINE HCL IN NACL 200 MCG/50ML IV SOLN
INTRAVENOUS | Status: DC | PRN
  Administered 2015-01-12: 0.2 ug/kg/h via INTRAVENOUS

## 2015-01-12 MED ORDER — METOPROLOL TARTRATE 1 MG/ML IV SOLN: 2.5000 mg | INTRAVENOUS | Status: DC | PRN

## 2015-01-12 MED ORDER — MIDAZOLAM HCL 2 MG/2ML IJ SOLN: 2.0000 mg | INTRAMUSCULAR | Status: DC | PRN

## 2015-01-12 MED ORDER — SODIUM CHLORIDE 0.9 % IV SOLN
250.0000 mL | INTRAVENOUS | Status: DC
  Administered 2015-01-13: 250 mL via INTRAVENOUS

## 2015-01-12 MED ORDER — ETOMIDATE 2 MG/ML IV SOLN
INTRAVENOUS | Status: DC | PRN
  Administered 2015-01-12: 20 mg via INTRAVENOUS

## 2015-01-12 MED ORDER — METOPROLOL TARTRATE 25 MG/10 ML ORAL SUSPENSION
12.5000 mg | Freq: Two times a day (BID) | ORAL | Status: DC
  Filled 2015-01-12 (×3): qty 5

## 2015-01-12 MED ORDER — DEXTROSE 5 % IV SOLN
1.5000 g | Freq: Two times a day (BID) | INTRAVENOUS | Status: AC
  Administered 2015-01-12 – 2015-01-14 (×4): 1.5 g via INTRAVENOUS
  Filled 2015-01-12 (×4): qty 1.5

## 2015-01-12 MED ORDER — ACETAMINOPHEN 650 MG RE SUPP
650.0000 mg | Freq: Once | RECTAL | Status: AC
  Administered 2015-01-12: 650 mg via RECTAL

## 2015-01-12 MED ORDER — ASPIRIN EC 325 MG PO TBEC
325.0000 mg | DELAYED_RELEASE_TABLET | Freq: Every day | ORAL | Status: DC
  Administered 2015-01-13 – 2015-01-20 (×8): 325 mg via ORAL
  Filled 2015-01-12 (×8): qty 1

## 2015-01-12 MED ORDER — SODIUM CHLORIDE 0.9 % IV SOLN
INTRAVENOUS | Status: DC
  Administered 2015-01-12: 2.2 [IU]/h via INTRAVENOUS
  Filled 2015-01-12 (×2): qty 2.5

## 2015-01-12 MED ORDER — CHLORHEXIDINE GLUCONATE 4 % EX LIQD: 30.0000 mL | CUTANEOUS | Status: DC

## 2015-01-12 MED ORDER — PROPOFOL 10 MG/ML IV BOLUS
INTRAVENOUS | Status: AC
  Filled 2015-01-12: qty 20

## 2015-01-12 MED ORDER — FAMOTIDINE IN NACL 20-0.9 MG/50ML-% IV SOLN
20.0000 mg | Freq: Two times a day (BID) | INTRAVENOUS | Status: AC
  Administered 2015-01-12 (×2): 20 mg via INTRAVENOUS
  Filled 2015-01-12: qty 50

## 2015-01-12 MED ORDER — SODIUM CHLORIDE 0.9 % IV SOLN
10.0000 g | INTRAVENOUS | Status: DC | PRN
  Administered 2015-01-12: 5 g/h via INTRAVENOUS

## 2015-01-12 MED ORDER — VANCOMYCIN HCL IN DEXTROSE 1-5 GM/200ML-% IV SOLN
1000.0000 mg | Freq: Once | INTRAVENOUS | Status: AC
  Administered 2015-01-12: 1000 mg via INTRAVENOUS
  Filled 2015-01-12: qty 200

## 2015-01-12 MED ORDER — BUPROPION HCL ER (XL) 300 MG PO TB24
300.0000 mg | ORAL_TABLET | Freq: Every day | ORAL | Status: DC
  Administered 2015-01-13 – 2015-01-20 (×8): 300 mg via ORAL
  Filled 2015-01-12 (×8): qty 1

## 2015-01-12 MED ORDER — 0.9 % SODIUM CHLORIDE (POUR BTL) OPTIME
TOPICAL | Status: DC | PRN
  Administered 2015-01-12: 1000 mL

## 2015-01-12 MED ORDER — ASPIRIN 81 MG PO CHEW
324.0000 mg | CHEWABLE_TABLET | Freq: Every day | ORAL | Status: DC
  Filled 2015-01-12: qty 4

## 2015-01-12 MED ORDER — HEPARIN SODIUM (PORCINE) 1000 UNIT/ML IJ SOLN
INTRAMUSCULAR | Status: AC
  Filled 2015-01-12: qty 2

## 2015-01-12 MED ORDER — ACETAMINOPHEN 500 MG PO TABS
1000.0000 mg | ORAL_TABLET | Freq: Four times a day (QID) | ORAL | Status: AC
  Administered 2015-01-13 – 2015-01-17 (×15): 1000 mg via ORAL
  Filled 2015-01-12 (×22): qty 2

## 2015-01-12 MED ORDER — PANTOPRAZOLE SODIUM 40 MG PO TBEC
40.0000 mg | DELAYED_RELEASE_TABLET | Freq: Every day | ORAL | Status: DC
  Administered 2015-01-14 – 2015-01-20 (×7): 40 mg via ORAL
  Filled 2015-01-12 (×7): qty 1

## 2015-01-12 MED ORDER — BISACODYL 5 MG PO TBEC
10.0000 mg | DELAYED_RELEASE_TABLET | Freq: Every day | ORAL | Status: DC
  Administered 2015-01-13 – 2015-01-17 (×5): 10 mg via ORAL
  Filled 2015-01-12 (×5): qty 2

## 2015-01-12 MED ORDER — SODIUM CHLORIDE 0.9 % IV SOLN
INTRAVENOUS | Status: DC
  Administered 2015-01-12: 10 mL/h via INTRAVENOUS

## 2015-01-12 MED ORDER — ONDANSETRON HCL 4 MG/2ML IJ SOLN
4.0000 mg | Freq: Four times a day (QID) | INTRAMUSCULAR | Status: DC | PRN
  Administered 2015-01-12 – 2015-01-14 (×5): 4 mg via INTRAVENOUS
  Filled 2015-01-12 (×4): qty 2

## 2015-01-12 MED ORDER — INSULIN REGULAR BOLUS VIA INFUSION
0.0000 [IU] | Freq: Three times a day (TID) | INTRAVENOUS | Status: DC
  Filled 2015-01-12: qty 10

## 2015-01-12 MED ORDER — DOPAMINE-DEXTROSE 3.2-5 MG/ML-% IV SOLN: 2.0000 ug/kg/min | INTRAVENOUS | Status: DC

## 2015-01-12 MED ORDER — THROMBIN 20000 UNITS EX SOLR
CUTANEOUS | Status: AC
  Filled 2015-01-12: qty 20000

## 2015-01-12 MED FILL — Heparin Sodium (Porcine) Inj 1000 Unit/ML: INTRAMUSCULAR | Qty: 10 | Status: AC

## 2015-01-12 MED FILL — Sodium Bicarbonate IV Soln 8.4%: INTRAVENOUS | Qty: 50 | Status: AC

## 2015-01-12 MED FILL — Lidocaine HCl IV Inj 20 MG/ML: INTRAVENOUS | Qty: 5 | Status: AC

## 2015-01-12 MED FILL — Electrolyte-R (PH 7.4) Solution: INTRAVENOUS | Qty: 3000 | Status: AC

## 2015-01-12 MED FILL — Sodium Chloride IV Soln 0.9%: INTRAVENOUS | Qty: 2000 | Status: AC

## 2015-01-12 MED FILL — Mannitol IV Soln 20%: INTRAVENOUS | Qty: 500 | Status: AC

## 2015-01-12 SURGICAL SUPPLY — 103 items
ADAPTER CARDIO PERF ANTE/RETRO (ADAPTER) ×4 IMPLANT
ADPR PRFSN 84XANTGRD RTRGD (ADAPTER) ×2
APL SRG 7X2 LUM MLBL SLNT (VASCULAR PRODUCTS) ×4
APPLICATOR TIP COSEAL (VASCULAR PRODUCTS) ×6 IMPLANT
ATTRACTOMAT 16X20 MAGNETIC DRP (DRAPES) ×4 IMPLANT
BAG DECANTER FOR FLEXI CONT (MISCELLANEOUS) ×4 IMPLANT
BLADE STERNUM SYSTEM 6 (BLADE) ×4 IMPLANT
BLADE SURG 15 STRL LF DISP TIS (BLADE) ×2 IMPLANT
BLADE SURG 15 STRL SS (BLADE) ×4
CANISTER SUCTION 2500CC (MISCELLANEOUS) ×4 IMPLANT
CANNULA GUNDRY RCSP 15FR (MISCELLANEOUS) ×4 IMPLANT
CATH HEART VENT LEFT (CATHETERS) IMPLANT
CATH ROBINSON RED A/P 18FR (CATHETERS) ×12 IMPLANT
CATH THORACIC 36FR (CATHETERS) ×4 IMPLANT
CATH THORACIC 36FR RT ANG (CATHETERS) ×4 IMPLANT
CAUTERY HIGH TEMP VAS (MISCELLANEOUS) ×4 IMPLANT
CLIP TI WIDE RED SMALL 24 (CLIP) ×2 IMPLANT
CONT SPEC 4OZ CLIKSEAL STRL BL (MISCELLANEOUS) ×4 IMPLANT
CONT SPEC STER OR (MISCELLANEOUS) ×4 IMPLANT
COVER SURGICAL LIGHT HANDLE (MISCELLANEOUS) ×8 IMPLANT
CRADLE DONUT ADULT HEAD (MISCELLANEOUS) ×4 IMPLANT
DRAPE SLUSH/WARMER DISC (DRAPES) ×4 IMPLANT
DRSG COVADERM 4X14 (GAUZE/BANDAGES/DRESSINGS) ×4 IMPLANT
ELECT CAUTERY BLADE 6.4 (BLADE) ×6 IMPLANT
ELECT REM PT RETURN 9FT ADLT (ELECTROSURGICAL) ×8
ELECTRODE REM PT RTRN 9FT ADLT (ELECTROSURGICAL) ×4 IMPLANT
GAUZE SPONGE 4X4 12PLY STRL (GAUZE/BANDAGES/DRESSINGS) ×4 IMPLANT
GLOVE BIO SURGEON STRL SZ 6 (GLOVE) IMPLANT
GLOVE BIO SURGEON STRL SZ 6.5 (GLOVE) IMPLANT
GLOVE BIO SURGEON STRL SZ7 (GLOVE) IMPLANT
GLOVE BIO SURGEON STRL SZ7.5 (GLOVE) IMPLANT
GLOVE BIO SURGEONS STRL SZ 6.5 (GLOVE)
GLOVE BIOGEL M 7.0 STRL (GLOVE) ×6 IMPLANT
GLOVE BIOGEL M STER SZ 6 (GLOVE) ×6 IMPLANT
GLOVE BIOGEL PI IND STRL 6 (GLOVE) ×1 IMPLANT
GLOVE BIOGEL PI IND STRL 6.5 (GLOVE) IMPLANT
GLOVE BIOGEL PI IND STRL 7.0 (GLOVE) ×4 IMPLANT
GLOVE BIOGEL PI INDICATOR 6 (GLOVE) ×2
GLOVE BIOGEL PI INDICATOR 6.5 (GLOVE) ×4
GLOVE BIOGEL PI INDICATOR 7.0 (GLOVE) ×8
GLOVE EUDERMIC 7 POWDERFREE (GLOVE) ×8 IMPLANT
GOWN STRL REUS W/ TWL LRG LVL3 (GOWN DISPOSABLE) ×8 IMPLANT
GOWN STRL REUS W/ TWL XL LVL3 (GOWN DISPOSABLE) ×2 IMPLANT
GOWN STRL REUS W/TWL LRG LVL3 (GOWN DISPOSABLE) ×24
GOWN STRL REUS W/TWL XL LVL3 (GOWN DISPOSABLE) ×4
GRAFT GELWEAVE VALSALVA 26 (Prosthesis & Implant Heart) IMPLANT
GRAFT GELWEAVE VALSALVA 26CM (Prosthesis & Implant Heart) ×4 IMPLANT
GRAFT HEMASHIELD 28X40 (Vascular Products) ×4 IMPLANT
GRAFT HEMASHIELD 28X50 (Vascular Products) IMPLANT
HEART VENT LT CURVED (MISCELLANEOUS) ×4 IMPLANT
HEMOSTAT POWDER SURGIFOAM 1G (HEMOSTASIS) ×12 IMPLANT
HEMOSTAT SURGICEL 2X14 (HEMOSTASIS) ×4 IMPLANT
KIT BASIN OR (CUSTOM PROCEDURE TRAY) ×4 IMPLANT
KIT CATH CPB BARTLE (MISCELLANEOUS) ×4 IMPLANT
KIT ROOM TURNOVER OR (KITS) ×4 IMPLANT
KIT SUCTION CATH 14FR (SUCTIONS) ×4 IMPLANT
LINE VENT (MISCELLANEOUS) ×2 IMPLANT
LOOP VESSEL SUPERMAXI WHITE (MISCELLANEOUS) ×2 IMPLANT
NDL SUT 1 .5 CRC FRENCH EYE (NEEDLE) IMPLANT
NEEDLE FRENCH EYE (NEEDLE) ×4
NS IRRIG 1000ML POUR BTL (IV SOLUTION) ×24 IMPLANT
PACK OPEN HEART (CUSTOM PROCEDURE TRAY) ×4 IMPLANT
PAD ARMBOARD 7.5X6 YLW CONV (MISCELLANEOUS) ×8 IMPLANT
PENCIL BUTTON HOLSTER BLD 10FT (ELECTRODE) ×2 IMPLANT
SEALANT SURG COSEAL 8ML (VASCULAR PRODUCTS) ×2 IMPLANT
SET CARDIOPLEGIA MPS 5001102 (MISCELLANEOUS) ×2 IMPLANT
SPONGE GAUZE 4X4 12PLY STER LF (GAUZE/BANDAGES/DRESSINGS) ×3 IMPLANT
SUT BONE WAX W31G (SUTURE) ×4 IMPLANT
SUT ETHIBON 2 0 V 52N 30 (SUTURE) ×8 IMPLANT
SUT ETHIBON EXCEL 2-0 V-5 (SUTURE) IMPLANT
SUT ETHIBOND 2 0 SH (SUTURE) ×4
SUT ETHIBOND 2 0 SH 36X2 (SUTURE) IMPLANT
SUT ETHIBOND V-5 VALVE (SUTURE) IMPLANT
SUT PROLENE 3 0 SH 1 (SUTURE) ×4 IMPLANT
SUT PROLENE 3 0 SH 48 (SUTURE) ×8 IMPLANT
SUT PROLENE 3 0 SH DA (SUTURE) IMPLANT
SUT PROLENE 3 0 SH1 36 (SUTURE) ×4 IMPLANT
SUT PROLENE 4 0 RB 1 (SUTURE) ×20
SUT PROLENE 4-0 RB1 .5 CRCL 36 (SUTURE) ×8 IMPLANT
SUT PROLENE 5 0 RB 2 (SUTURE) ×14 IMPLANT
SUT SILK 2 0 SH CR/8 (SUTURE) ×3 IMPLANT
SUT STEEL 6MS V (SUTURE) ×4 IMPLANT
SUT STEEL STERNAL CCS#1 18IN (SUTURE) IMPLANT
SUT STEEL SZ 6 DBL 3X14 BALL (SUTURE) IMPLANT
SUT VIC AB 1 CTX 36 (SUTURE) ×8
SUT VIC AB 1 CTX36XBRD ANBCTR (SUTURE) ×4 IMPLANT
SUT VIC AB 2-0 CT1 27 (SUTURE)
SUT VIC AB 2-0 CT1 TAPERPNT 27 (SUTURE) IMPLANT
SUT VIC AB 3-0 X1 27 (SUTURE) IMPLANT
SUTURE E-PAK OPEN HEART (SUTURE) ×4 IMPLANT
SYSTEM SAHARA CHEST DRAIN ATS (WOUND CARE) ×4 IMPLANT
TAPE CLOTH SURG 4X10 WHT LF (GAUZE/BANDAGES/DRESSINGS) ×3 IMPLANT
TOWEL OR 17X24 6PK STRL BLUE (TOWEL DISPOSABLE) ×8 IMPLANT
TOWEL OR 17X26 10 PK STRL BLUE (TOWEL DISPOSABLE) ×8 IMPLANT
TRAY FOLEY IC TEMP SENS 14FR (CATHETERS) ×4 IMPLANT
TRAY FOLEY IC TEMP SENS 16FR (CATHETERS) ×4 IMPLANT
TUBE CONNECTING 12'X1/4 (SUCTIONS) ×1
TUBE CONNECTING 12X1/4 (SUCTIONS) ×1 IMPLANT
UNDERPAD 30X30 INCONTINENT (UNDERPADS AND DIAPERS) ×4 IMPLANT
VALVE MAGNA EASE AORTIC 23MM (Prosthesis & Implant Heart) ×2 IMPLANT
VENT LEFT HEART 12002 (CATHETERS) ×4
WATER STERILE IRR 1000ML POUR (IV SOLUTION) ×8 IMPLANT
YANKAUER SUCT BULB TIP NO VENT (SUCTIONS) ×2 IMPLANT

## 2015-01-12 NOTE — Progress Notes (Signed)
S/p Biologic [place]  BP 97/62 mmHg  Pulse 91  Temp(Src) 95.9 F (35.5 C) (Oral)  Resp 15  Ht 5\' 1"  (1.549 m)  Wt 108 lb (48.988 kg)  BMI 20.42 kg/m2  SpO2 100%  28/19 CI= 1.9   Intake/Output Summary (Last 24 hours) at 01/12/15 1659 Last data filed at 01/12/15 1624  Gross per 24 hour  Intake 4369.66 ml  Output   2860 ml  Net 1509.66 ml   K=3.5- being supplemented HCT= 30  Stable early postop

## 2015-01-12 NOTE — Brief Op Note (Signed)
01/12/2015  12:30 PM  PATIENT:  Doroteo Bradford A Syfert  69 y.o. female  PRE-OPERATIVE DIAGNOSIS:  Severr AS, Ascending aortic aneurysm  POST-OPERATIVE DIAGNOSIS:  Severe AS, Ascending aortic aneurysm  PROCEDURE:   BIOLOGICAL BENTALL PROCEDURE (using deep hypothermic circulatory arrest and retrograde cerebral cardioplegia)  Replacement of ascending aorta with 69mm Hemashield graft  AVR with 23 mm Edwards pericardial tissue valve to 26 mm Gelweave conduit  SURGEON:  Gaye Pollack, MD  ASSISTANT: Suzzanne Cloud, PA-C  ANESTHESIA:   general  PATIENT CONDITION:  ICU - intubated and hemodynamically stable.  PRE-OPERATIVE WEIGHT: 49 kg   Aortic Valve Etiology   Aortic Insufficiency:  Mild  Aortic Valve Disease:  Yes.  Aortic Stenosis:  Yes. Smallest Aortic Valve Area: 0.9 cm2; Highest Mean Gradient: 46 mmHg.  Etiology (Choose at least one and up to  5 etiologies):  Degenerative - Calcified and Primary Aortic Disease, Atherosclerotic Aneurysm   Aortic Valve  Procedure Performed:  Replacement: Yes.  Bioprosthetic Valve. Implant Model Number:[id number], Size:23, Unique Device Identifier:[id number]  Repair/Reconstruction: Yes.  Replacement AV & insertion aortic non-valved conduit Bioprosthetic Valve. Implant Model Number:[id number], Size:23, Unique Device Identifier:[id number]  Aortic Annular Enlargement: No.

## 2015-01-12 NOTE — Op Note (Signed)
CARDIOVASCULAR SURGERY OPERATIVE NOTE  01/12/2015  Surgeon:  Gaye Pollack, MD  First Assistant: Suzzanne Cloud,  PA-C   Preoperative Diagnosis:   1.  Severe bicuspid aortic valve stenosis 2.  Aortic root and ascending aortic aneurysm   Postoperative Diagnosis:  Same   Procedure:  1. Median Sternotomy 2. Extracorporeal circulation 3.   Replacement of ascending aortic aneurysm under deep hypothermic circulatory arrest 4.   Aortic valve and root replacement using a composite 26 mm Gelweave Valsalva graft and 23 mm Edwards Magna-Ease pericardial valve with reimplantation of the coronary arteries. Programmer, systems procedure)   Anesthesia:  General Endotracheal   Clinical History/Surgical Indication:  The patient is a 69 year old smoker with a history of possible bicuspid aortic valve disease with AS/AI. Her most recent 2D echo on 09/19/2014 showed a moderately thickened bicuspid aortic valve with moderate stenosis and moderate regurgitation with a mean gradient of 24 mm Hg. The LVEF was reduced to 30-35% compared to 07/07/2013 when the EF was 55-65% with mild to moderate stenosis and a mean gradient of 22 mm Hg with moderate AI. The ascending aorta has been dilated with a root diameter recorded as 36 mm in 2014 and 38 mm now. She underwent cardiac cath on 09/23/2014 which showed a PAP of 59/28 with a wedge of 36 and an AVA of 0.84 cm2 with a mean gradient measured at only 12.7 mm Hg. LVEDP was 32. There was no significant coronary disease. Since she had a low gradient considering the appearance of her valve and cardiomyopathy, a dobutamine stress echo was done that showed low gradient severe AS with a mean gradient at rest of 33 increasing to 46 at 10 mcg and 52 at 20 mcg. She had a CTA of the chest in July 2015 that showed a maximum diameter of the sinus of Valsalva of 4.7 cm, STJ of 3.5 cm, and ascending aorta of 4.0 cm.  She has long-standing dyspnea and is followed by Dr. Melvyn Novas. She  continues to smoke 3/4 pack of cigarettes per day. She does report exertional fatigue and says she gets very tired later in the day. 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   She has low gradient severe bicuspid aortic stenosis with moderate to severe LV dysfunction that is symptomatic with exertional fatigue and shortness of breath. She also has an aortic root and ascending aortic aneurysm with a maximum diameter at the sinus portion of 4.7 cm. I think aortic valve and root replacement with replacement of the ascending aorta is indicated to relieve her symptoms and to prevent progressive LV deterioration. She is 8 and has fairly severe DJD and uses frequent NSAID's so a tissue valve would be best for her. She saw her dentist recently and says that he made sure that there were no active problems with her teeth. I discussed the operative procedure with the patient including alternatives, benefits and risks; including but not limited to bleeding, blood transfusion, infection, stroke, myocardial infarction, heart block requiring a permanent pacemaker, organ dysfunction, and death. Carol Warren understands and agrees to proceed.    Preparation:  The patient was seen in the preoperative holding area and the correct patient, correct operation were confirmed with the patient after reviewing the medical record and catheterization. The consent was signed by me. Preoperative antibiotics were given. A pulmonary  arterial line and radial arterial line were placed by the anesthesia team. The patient was taken back to the operating room and positioned supine on the operating room table. After being placed under general endotracheal anesthesia by the anesthesia team a foley catheter was placed. The neck, chest, abdomen, and both legs were prepped with betadine soap and solution and draped in the  usual sterile manner. A surgical time-out was taken and the correct patient and operative procedure were confirmed with the nursing and anesthesia staff.  TEE:  Performed by Dr. Rosezetta Schlatter  This showed severe calcific aortic stenosis. LVEF was 45%. There was trivial MR.   Cardiopulmonary Bypass:  A median sternotomy was performed. The pericardium was opened in the midline. Right ventricular function appeared normal. The ascending aorta was of normal size and had no palpable plaque. There were no contraindications to aortic cannulation or cross-clamping. The patient was fully systemically heparinized and the ACT was maintained > 400 sec. The proximal aortic arch was cannulated with a 20 F aortic cannula for arterial inflow. Venous cannulation was performed via the right atrial appendage using a two-staged venous cannula.  Hyperkalemic retrograde cold blood cardioplegia was used to induce diastolic arrest and was then was given at about 20 minute intervals throughout the period of arrest to maintain myocardial temperature at or below 10 degrees centigrade. A temperature probe was inserted into the interventricular septum and an insulating pad was placed in the pericardium. CO2 was insufflated into the pericardium throughout the case to minimize intracardiac air.    Resection and grafting of ascending aortic aneurysm:  The patient was placed on cardiopulmonary bypass and a left ventricular vent was placed via the right superior pulmonary vein. Systemic cooling was begun with a goal temperature of 18 degrees centigrade by bladder and rectal temperature probes. A retrograde cardioplegia cannula was placed through the right atrium into the coronary sinus without difficulty. A retrograde cerebraplegia cannula was placed into the SVC through a pursestring suture and the SVC was encircled with a silastic tape. After 30 minutes of cooling the target temperature of 18 degrees centigrade was reached. Cerebral  oximetry was 70% bilaterally. BIS was zero. The patient was given 150 mg of Etomidate and 125 mg of Solumedrol. The head was packed in ice. The bed was placed in steep trendelenburg. Circulatory arrest was begun and the blood volume emptied into the venous reservoir. Continuous retrograde cerebraplegia was begun and the SVC occluded with the silastic tape. Cold blood retrograde cardioplegia was given and myocardial temperature dropped to 10 degrees centigrade. Additional doses were given at approximately 20 minute intervals throughout the period of circulatory arrest and cross-clamping. Complete diastolic arrest was maintained. The aortic cannula was removed. The aorta was transected just proximal to the innominate artery beveling the resection out along the undersurface of the aortic arch (Hemiarch replacement). The aortic diameter was measured at 28 mm here. A 28 x 10 mm Hemasheild Platinum vascular graft was prepared. ( Catalog # [id number], Lot # [id number]). It was anastomosed to the aortic arch in an end to end manner using 3-0 prolene continuous suture with a felt strip to reinforce the anastomisis. A light coating of CoSeal was applied to seal needle holes. The arterial end of the bypass circuit was then connected to the 25mm side arm graft and circulation was slowly resumed. The tape was removed from the SVC. The aortic graft was cross-clamped proximal to the side arm graft and full CPB support was resumed. Circulatory arrest  time was 23 minutes. Retrograde cerebraplegia time was 13 minutes.   [facility] Procedure:   The ascending aorta was mobilized from the right pulmonary artery and main PA. It was opened longitudinally and the valve inspected. The aortic valve was bicuspid with fusion of the right and non-coronary leaflets that looked congenital. There were 3 commissures. The leaflets were heavily calcified with poor mobility. The annulus had mild calcification. The right and left coronary arteries were  removed from the aortic root with a button of aortic wall around the ostia. They were retracted carefully out of the way with stay sutures to prevent rotation. The native valve was excised taking care to remove all particulate debri. The annulus was decalcified with rongeurs. The annulus was sized and a 23 mm Edwards Magna-Ease pericardial valve was chosenEngineering geologist # [id number], Serial # [id number]). A 26 mm Gelweave Valsalva vascular graft was chosen ( REF # [id number], Lot # [id number], SN [id number]). The proximal cuff was shortened to 3 rings. The valve was placed inside the graft so that the sewing ring was adjacent to the proximal cuff. It was placed in the proper orientation and held in position with three 4-0 prolene sutures at the commissures.  A series of pledgetted 2-0 Ethibond horizontal mattress sutures were placed around the annulus with the pledgets in a sub-annular position. The sutures were placed through a strip of autologous pericardium to reinforce the annulus and then the valve sewing ring and proximal graft cuff. The valve was lowered into place and the sutures at the hinge posts tied first followed by the remaining sutures. The valve seated nicely.  Small openings were made in the graft for the coronary anastomoses using a thermal cautery. Then the left and right coronary buttons were anastomosed to the graft in an end to side manner using continuous 5-0 prolene suture. A light coating of CoSeal was applied to each anastomosis for hemostasis. The two grafts were then cut to the appropriate length and anastomosed end to end using continuous 4-0 prolene suture. CoSeal was applied to seal the needle holes in the grafts. A vent cannula was placed into the graft to remove any air. Deairing maneuvers were performed and the bed placed in trendelenburg position.   Completion:  The patient was rewarmed to 37 degrees Centigrade. The crossclamp was removed with a time of 123 minutes. There was spontaneous return  of atrial rhythm with complete heart block. The distal and proximal anastomoses were checked for hemostasis. The position of the grafts was satisfactory. The vascular anastomoses all appeared hemostatic. Two temporary epicardial pacing wires were placed on the right atrium and two on the right ventricle. The patient was paced in AV sequential mode. The patient was weaned from CPB without difficulty on dopamine 3 mcg. CPB time was 189 minutes. Cardiac output was 5 LPM. TEE showed a normally functioning aortic valve prosthesis with no regurgitation through the valve. Heparin was fully reversed with protamine and the aortic and venous cannulas removed. Hemostasis was achieved. Mediastinal and left pleural drainage tubes were placed. The sternum was closed with #6 stainless steel wires. The fascia was closed with continuous # 1 vicryl suture. The subcutaneous tissue was closed with 2-0 vicryl continuous suture. The skin was closed with 3-0 vicryl subcuticular suture. All sponge, needle, and instrument counts were reported correct at the end of the case. Dry sterile dressings were placed over the incisions and around the chest tubes which were connected to pleurevac suction. The patient was then transported  to the surgical intensive care unit in critical but stable condition.

## 2015-01-12 NOTE — Transfer of Care (Signed)
Immediate Anesthesia Transfer of Care Note  Patient: Carol Warren  Procedure(s) Performed: Procedure(s): AORTIC VALVE REPLACEMENT (AVR) (N/A) ASCENDING AORTIC ROOT REPLACEMENT (N/A) TRANSESOPHAGEAL ECHOCARDIOGRAM (TEE) (N/A)  Patient Location: ICU  Anesthesia Type:General  Level of Consciousness: unresponsive and Patient remains intubated per anesthesia plan  Airway & Oxygen Therapy: Patient remains intubated per anesthesia plan and Patient placed on Ventilator (see vital sign flow sheet for setting)  Post-op Assessment: Post -op Vital signs reviewed and stable  Post vital signs: Reviewed and stable  Complications: No apparent anesthesia complications

## 2015-01-12 NOTE — Anesthesia Postprocedure Evaluation (Signed)
  Anesthesia Post-op Note  Patient: Carol Warren  Procedure(s) Performed: Procedure(s): AORTIC VALVE REPLACEMENT (AVR) (N/A) ASCENDING AORTIC ROOT REPLACEMENT (N/A) TRANSESOPHAGEAL ECHOCARDIOGRAM (TEE) (N/A)  Patient Location: ICU  Anesthesia Type:General  Level of Consciousness: unresponsive and Patient remains intubated per anesthesia plan  Airway and Oxygen Therapy: Patient remains intubated per anesthesia plan and Patient placed on Ventilator (see vital sign flow sheet for setting)  Post-op Pain: none  Post-op Assessment: Post-op Vital signs reviewed, Patient's Cardiovascular Status Stable, Respiratory Function Stable and No signs of Nausea or vomiting  Post-op Vital Signs: Reviewed and stable  Last Vitals:  Filed Vitals:   01/12/15 0548  BP: 165/90  Pulse: 110  Temp: 36.5 C  Resp: 20    Complications: No apparent anesthesia complications

## 2015-01-12 NOTE — H&P (Signed)
[address]       [place] [address]             [phone]      Cardiothoracic Surgery History and Physical   PCP is PROCHNAU,CAROLINE, MD Referring Provider is Minus Breeding, MD  Chief Complaint  Patient presents with  . Aortic Stenosis    Severe by ECHO 09/19/14.DOBUTAMINE STRESS ECHO 11/22/14.Marland KitchenPFT'S 11/09/14  . Ascending aortic aneurysm    HPI:  The patient is a 69 year old smoker with a history of possible bicuspid aortic valve disease with AS/AI. Her most recent 2D echo on 09/19/2014 showed a moderately thickened bicuspid aortic valve with moderate stenosis and moderate regurgitation with a mean gradient of 24 mm Hg. The LVEF was reduced to 30-35% compared to 07/07/2013 when the EF was 55-65% with mild to moderate stenosis and a mean gradient of 22 mm Hg with moderate AI. The ascending aorta has been dilated with a root diameter recorded as 36 mm in 2014 and 38 mm now. She underwent cardiac cath on 09/23/2014 which showed a PAP of 59/28 with a wedge of 36 and an AVA of 0.84 cm2 with a mean gradient measured at only 12.7 mm Hg. LVEDP was 32. There was no significant coronary disease. Since she had a low gradient considering the appearance of her valve and cardiomyopathy, a dobutamine stress echo was done that showed low gradient severe AS with a mean gradient at rest of 33 increasing to 46 at 10 mcg and 52 at 20 mcg. She had a CTA of the chest in July 2015 that showed a maximum diameter of the sinus of Valsalva of 4.7 cm, STJ of 3.5 cm, and ascending aorta of 4.0 cm.  She has long-standing dyspnea and is followed by Dr. Melvyn Novas. She continues to smoke 3/4 pack of cigarettes per day. She does report exertional fatigue and says she gets very tired later in the day. 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    Past Medical History    Diagnosis Date  . 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 Osteopenia   . HSV-1 (herpes simplex virus 1) infection   . HSV-2 (herpes simplex virus 2) infection   . Bicuspid aortic valve     CONGENITAL  . Aortic aneurysm     CARDIOLOGIST IS DR. Bettina Gavia [place]  . Adrenal tumor   . Degenerative disc disease     CERVICAL AND LUMBAR (DR. Lorin Mercy)  . Smoker   . Anxiety   . Chronic sinusitis   . Chronic depression   . Osteoarthritis   . Carpal tunnel syndrome, bilateral   . Pernicious anemia   . Insomnia   . Asthma   . Pericardial cyst   . Chronic fatigue   . Vitamin B 12 deficiency   . Plantar fasciitis   . Hammer toe of right foot   . Hepatomegaly   . Rosacea   . Esophagitis   . Gastritis     Past Surgical History  Procedure Laterality Date  . Excision of vulvar cis    . Sinus procedure  2009    x 6  . Excision of basal cell ca      SKIN   . Cataract extraction Bilateral     X2  . Carpal  tunnel release Bilateral 2008  . Bunionectomy with hammertoe reconstruction Right   . Colonoscopy    . Upper gastrointestinal endoscopy    . Tee without cardioversion N/A 10/06/2014    Procedure: TRANSESOPHAGEAL ECHOCARDIOGRAM (TEE); Surgeon: Sueanne Margarita, MD; Location: [facility] ENDOSCOPY; Service: Cardiovascular; Laterality: N/A;  . Left and right heart catheterization with coronary angiogram N/A 09/23/2014    Procedure: LEFT AND RIGHT HEART CATHETERIZATION WITH CORONARY ANGIOGRAM; Surgeon: Minus Breeding, MD; Location: [facility] CATH LAB; Service: Cardiovascular; Laterality: N/A;    Family History  Problem Relation Age of Onset  . Pancreatic cancer Mother 61  . Diabetes Father   . Hypertension Father   . Heart disease Father 18    CAD  . Breast cancer Sister   . Diabetes Maternal Grandmother    . Hypertension Maternal Grandmother   . Cancer Maternal Grandfather     colon or stomach  . Hypertension Paternal Grandmother   . Heart disease Paternal Grandmother     Later onset  . Asthma Grandchild   . Diverticulosis Paternal Grandmother     Social History History  Substance Use Topics  . Smoking status: Current Every Day Smoker -- 1.00 packs/day for 50 years    Types: Cigarettes  . Smokeless tobacco: Never Used     Comment: uses vapor cig  . Alcohol Use: No    Current Outpatient Prescriptions  Medication Sig Dispense Refill  . albuterol (PROVENTIL) (2.5 MG/3ML) 0.083% nebulizer solution Take 2.5 mg by nebulization 2 (two) times daily as needed for wheezing.    . AMBULATORY NON FORMULARY MEDICATION Topical nasal rinse/ Tobram Budesonide Use nasal as needed    . B Complex Vitamins (VITAMIN B COMPLEX PO) Take 1 tablet by mouth daily.    [name] buPROPion (WELLBUTRIN XL) 300 MG 24 hr tablet Take 300 mg by mouth daily.    . Calcium Carbonate-Vitamin D (CALCIUM-D PO) Take 1 tablet by mouth daily.     . cyanocobalamin (,VITAMIN B-12,) 1000 MCG/ML injection Inject 1,000 mcg into the muscle once a week.    . diazepam (VALIUM) 10 MG tablet Take 10 mg by mouth 3 (three) times daily as needed for anxiety.    . fluticasone (FLONASE) 50 MCG/ACT nasal spray Place 1 spray into the nose 2 (two) times daily.    . furosemide (LASIX) 20 MG tablet Take 1 tablet (20 mg total) by mouth daily. 30 tablet 6  . HYDROcodone-acetaminophen (NORCO/VICODIN) 5-325 MG per tablet Take 0.5 tablets by mouth every 4 (four) hours as needed for moderate pain.     [name] ibuprofen (ADVIL,MOTRIN) 200 MG tablet Take 400 mg by mouth every 6 (six) hours as needed for headache.     . loratadine (CLARITIN) 10 MG tablet Take 10 mg by mouth daily.    [name] losartan (COZAAR) 50 MG tablet Take 1 tablet (50 mg total) by  mouth daily. 30 tablet 6  . meloxicam (MOBIC) 15 MG tablet Take 15 mg by mouth daily as needed for pain.    . Menthol, Topical Analgesic, (ICY HOT EX) Apply 1 patch topically daily.    . montelukast (SINGULAIR) 10 MG tablet Take 10 mg by mouth at bedtime.    . Multiple Vitamin (MULTIVITAMIN) capsule Take 1 capsule by mouth daily.     . Olopatadine HCl (PATANASE) 0.6 % SOLN Place 2 puffs into both nostrils 2 (two) times daily.    [name] omeprazole (PRILOSEC) 20 MG capsule Take 40 mg by mouth daily.    . polyethylene  glycol (MIRALAX / GLYCOLAX) packet Take 17 g by mouth daily as needed.    . Polyvinyl Alcohol-Povidone (REFRESH OP) Place 2 drops into both eyes 2 (two) times daily.    . Probiotic Product (PROBIOTIC DAILY PO) Take 1 capsule by mouth daily. Ultimate flora    . pseudoephedrine-guaifenesin (MUCINEX D) 60-600 MG per tablet Take 1 tablet by mouth as needed for congestion.    . ranitidine (ZANTAC) 300 MG capsule Take 300 mg by mouth every evening.    . Saline (SIMPLY SALINE) 0.9 % AERS Place 2 sprays into both nostrils 2 (two) times daily.    . traZODone (DESYREL) 50 MG tablet Take 50 mg by mouth at bedtime.     No current facility-administered medications for this visit.    Allergies  Allergen Reactions  . Oxycodone Nausea Only    Review of Systems  Constitutional: Positive for activity change and fatigue. Negative for chills, appetite change and unexpected weight change.  HENT:   Saw her dentist recently and was told that she had a tooth in the mandible anteriorly that needed to be removed. Mandibular teeth are in fair to poor condition. Upper partial denture.  Eyes: Negative.  Respiratory: Positive for shortness of breath.  Cardiovascular: Negative for chest pain, palpitations and leg swelling.  Gastrointestinal: Positive for constipation.   Reflux  Endocrine: Negative.  Genitourinary:  Positive for frequency.  Musculoskeletal: Positive for joint swelling and arthralgias.  Skin: Negative.  Allergic/Immunologic: Negative.  Neurological: Positive for headaches.  Hematological: Bruises/bleeds easily.  Psychiatric/Behavioral: Positive for dysphoric mood. The patient is nervous/anxious.    BP 135/86 mmHg  Pulse 116  Resp 16  Ht 5\' 1"  (1.549 m)  Wt 113 lb (51.256 kg)  BMI 21.36 kg/m2  SpO2 97% Physical Exam  Constitutional: She is oriented to person, place, and time.  Thin, frail-appearing woman who looks older than her age.  HENT:  Head: Normocephalic and atraumatic.  Mouth/Throat: Oropharynx is clear and moist.  Raspy voice  Eyes: EOM are normal. Pupils are equal, round, and reactive to light.  Neck: Normal range of motion. Neck supple. No JVD present. No thyromegaly present.  Cardiovascular: Normal rate and regular rhythm.  Murmur heard. Harsh 3/6 systolic murmur at RSB  Pulmonary/Chest: Effort normal and breath sounds normal. No respiratory distress. She has no wheezes. She has no rales.  Abdominal: Soft. Bowel sounds are normal. She exhibits no distension and no mass. There is no tenderness.  Musculoskeletal: Normal range of motion. She exhibits no edema or tenderness.  Neurological: She is alert and oriented to person, place, and time. She has normal strength. No cranial nerve deficit or sensory deficit.  Skin: Skin is dry.  Psychiatric: She has a normal mood and affect.     Diagnostic Tests:       *[facility] [place], [address]            [place], [place] [address]              [phone]  ------------------------------------------------------------------- Echocardiography  Patient:  Carol Warren, Carol Warren MR #:    [id number] Study Date: 09/19/2014 Gender:   F Age:    63 Height:   154.9 cm Weight:   52.2 kg BSA:    1.5 m^2 Pt.  Status: Room:  ATTENDING  [facility] REFERRING  Minus Breeding SONOGRAPHER [name], RCS PERFORMING  [facility], Outpatient  cc:  -------------------------------------------------------------------  LV EF: 30% -  35%  ------------------------------------------------------------------- Indications:   424.1 Aortic valve disorders.  ------------------------------------------------------------------- History:  PMH: Bicuspid Aortic Valve.  ------------------------------------------------------------------- Study Conclusions  - Left ventricle: The cavity size was normal. Wall thickness was normal. Systolic function was moderately to severely reduced. The estimated ejection fraction was in the range of 30% to 35%. There is akinesis of the anteroseptal myocardium. Features are consistent with a pseudonormal left ventricular filling pattern, with concomitant abnormal relaxation and increased filling pressure (grade 2 diastolic dysfunction). - Aortic valve: Bicuspid; moderately thickened, moderately calcified leaflets. Cusp separation was reduced. There was moderate stenosis. There was moderate regurgitation. Peak velocity (S): 302 cm/s. Mean gradient (S): 24 mm Hg. Aortic stenosis may be underestimated as a result of decreased ejection fraction. - Aorta: The aorta was moderately calcified. Aortic root dimension: 38 mm (ED). - Ascending aorta: The ascending aorta was mildly dilated. - Mitral valve: Calcified annulus. Mildly thickened leaflets . There was mild to moderate regurgitation. - Pulmonary arteries: Systolic pressure was mildly increased. PA peak pressure: 44 mm Hg (S).  Echocardiography. M-mode, complete 2D, spectral Doppler, and color Doppler. Birthdate: Patient birthdate: [birth year]. Age: Patient is 69 yr old. Sex: Gender: female.  BMI: 21.7 kg/m^2. Blood pressure:   110/70 Patient  status: Outpatient. Study date: Study date: 09/19/2014. Study time: 12:30 PM. Location: Echo laboratory.  -------------------------------------------------------------------  ------------------------------------------------------------------- Left ventricle: The cavity size was normal. Wall thickness was normal. Systolic function was moderately to severely reduced. The estimated ejection fraction was in the range of 30% to 35%. Regional wall motion abnormalities:  There is akinesis of the anteroseptal myocardium. Features are consistent with a pseudonormal left ventricular filling pattern, with concomitant abnormal relaxation and increased filling pressure (grade 2 diastolic dysfunction).  ------------------------------------------------------------------- Aortic valve:  Bicuspid; moderately thickened, moderately calcified leaflets. Cusp separation was reduced. Doppler:  There was moderate stenosis.  There was moderate regurgitation.  VTI ratio of LVOT to aortic valve: 0.26. Valve area (VTI): 0.97 cm^2. Indexed valve area (VTI): 0.65 cm^2/m^2. Peak velocity ratio of LVOT to aortic valve: 0.2. Valve area (Vmax): 0.74 cm^2. Indexed valve area (Vmax): 0.49 cm^2/m^2. Mean velocity ratio of LVOT to aortic valve: 0.18. Valve area (Vmean): 0.62 cm^2. Indexed valve area (Vmean): 0.41 cm^2/m^2.  Mean gradient (S): 24 mm Hg. Peak gradient (S): 36 mm Hg.  ------------------------------------------------------------------- Aorta: The aorta was moderately calcified. Ascending aorta: The ascending aorta was mildly dilated.  ------------------------------------------------------------------- Mitral valve:  Calcified annulus. Mildly thickened leaflets . Doppler: There was mild to moderate regurgitation.  Peak gradient (D): 4 mm Hg.  ------------------------------------------------------------------- Left atrium: LA volume/ BSA = 34.9 ml/m2. The atrium was normal  in size.  ------------------------------------------------------------------- Right ventricle: The cavity size was normal. Wall thickness was normal. Systolic function was normal.  ------------------------------------------------------------------- Pulmonic valve:  Structurally normal valve.  Cusp separation was normal. Doppler: Transvalvular velocity was within the normal range. There was trivial regurgitation.  ------------------------------------------------------------------- Tricuspid valve:  Structurally normal valve.  Leaflet separation was normal. Doppler: Transvalvular velocity was within the normal range. There was mild regurgitation.  ------------------------------------------------------------------- Pulmonary artery:  The main pulmonary artery was normal-sized. Systolic pressure was mildly increased.  ------------------------------------------------------------------- Right atrium: The atrium was normal in size.  ------------------------------------------------------------------- Pericardium: The pericardium was normal in appearance. There was no pericardial effusion.  ------------------------------------------------------------------- Systemic veins: Inferior vena cava: The vessel was normal in size. The respirophasic diameter changes were in the normal range (= 50%), consistent with normal central venous pressure. Diameter: 13 mm.  ------------------------------------------------------------------- Post  procedure conclusions Ascending Aorta:  - The aorta was moderately calcified.  ------------------------------------------------------------------- Measurements  IVC                    Value     Reference ID                    13  mm    ---------  Left ventricle              Value     Reference LV ID, ED, PLAX chordal          51.6 mm    43 - 52 LV ID, ES,  PLAX chordal      (H)   44.9 mm    23 - 38 LV fx shortening, PLAX chordal  (L)   13  %    >=29 LV PW thickness, ED            11.7 mm    --------- IVS/LV PW ratio, ED            0.87      <=1.3 Stroke volume, 2D             50  ml    --------- Stroke volume/bsa, 2D           33  ml/m^2  --------- LV ejection fraction, 1-p A4C       32  %    --------- LV end-diastolic volume, 2-p       121  ml    --------- LV end-systolic volume, 2-p        83  ml    --------- LV ejection fraction, 2-p         31  %    --------- Stroke volume, 2-p            38  ml    --------- LV end-diastolic volume/bsa, 2-p     81  ml/m^2  --------- LV end-systolic volume/bsa, 2-p      55  ml/m^2  --------- Stroke volume/bsa, 2-p          25.3 ml/m^2  --------- LV e&', lateral              4.93 cm/s   --------- LV E/e&', lateral             20.28     --------- LV e&', medial               2.41 cm/s   --------- LV E/e&', medial              41.49     --------- LV e&', average              3.67 cm/s   --------- LV E/e&', average             27.25     ---------  Ventricular septum            Value     Reference IVS thickness, ED             10.2 mm    ---------  LVOT                   Value     Reference LVOT ID, S                21  mm    --------- LVOT area  3.46 cm^2   --------- LVOT peak velocity, S           60.9 cm/s   --------- LVOT mean velocity, S           41.7 cm/s   --------- LVOT VTI, S                14.4 cm    ---------  Aortic valve                Value     Reference Aortic valve peak velocity, S       302  cm/s   --------- Aortic valve mean velocity, S       232  cm/s   --------- Aortic valve VTI, S            56.1 cm    --------- Aortic mean gradient, S          24  mm Hg  --------- Aortic peak gradient, S          36  mm Hg  --------- VTI ratio, LVOT/AV            0.26      --------- Aortic valve area, VTI          0.97 cm^2   --------- Aortic valve area/bsa, VTI        0.65 cm^2/m^2 --------- Velocity ratio, peak, LVOT/AV       0.2      --------- Aortic valve area, peak velocity     0.74 cm^2   --------- Aortic valve area/bsa, peak        0.49 cm^2/m^2 --------- velocity Velocity ratio, mean, LVOT/AV       0.18      --------- Aortic valve area, mean velocity     0.62 cm^2   --------- Aortic valve area/bsa, mean        0.41 cm^2/m^2 --------- velocity Aortic regurg pressure half-time     294  ms    ---------  Aorta                   Value     Reference Aortic root ID, ED            38  mm    ---------  Left atrium                Value     Reference LA ID, A-P, ES              32  mm    --------- LA ID/bsa, A-P              2.13 cm/m^2  <=2.2 LA volume, ES, 1-p A4C          39  ml    --------- LA volume/bsa, ES, 1-p A4C        26  ml/m^2  --------- LA volume, ES, 1-p A2C          61  ml    --------- LA volume/bsa, ES, 1-p A2C        40.6 ml/m^2  ---------  Mitral valve               Value     Reference Mitral E-wave peak velocity        100  cm/s   --------- Mitral A-wave peak velocity        44.9 cm/s   --------- Mitral  deceleration time     (L)  102  ms    150 - 230 Mitral peak gradient, D          4   mm Hg  --------- Mitral E/A ratio, peak          2.2      ---------  Pulmonary arteries            Value     Reference PA pressure, S, DP        (H)   44  mm Hg  <=30  Tricuspid valve              Value     Reference Tricuspid regurg peak velocity      322  cm/s   --------- Tricuspid peak RV-RA gradient       41  mm Hg  --------- Tricuspid maximal regurg         322  cm/s   --------- velocity, PISA  Systemic veins              Value     Reference Estimated CVP               3   mm Hg  ---------  Right ventricle              Value     Reference RV pressure, S, DP        (H)   44  mm Hg  <=30 RV s&', lateral, S             11.5 cm/s   ---------  Legend: (L) and (H) mark values outside specified reference range.  ------------------------------------------------------------------- Prepared and Electronically Authenticated by  Candee Furbish, M.D. 2015-09-21T14:12:28   Cardiac Catheterization Procedure Note  Name: Carol Warren MRN: [id number] DOB: [birth year]  Procedure: Right Heart Cath, Left Heart Cath, Selective Coronary Angiography, LV angiography  Indication: Cardiomyopathy with an EF newly found to be 35% and anterior hypokinesis, AS/AI  Procedural Details: The right groin was prepped, draped, and anesthetized with 1% lidocaine. Using the modified Seldinger technique a 5 French sheath was placed in the right femoral artery and a 7 French sheath was placed in the right femoral vein. A Swan-Ganz catheter was used for the right heart catheterization. Standard protocol was followed for recording of right heart pressures and sampling of oxygen saturations. Fick cardiac output  was calculated. Standard Judkins catheters were used for selective coronary angiography and left ventriculography. There were no immediate procedural complications. The patient was transferred to the post catheterization recovery area for further monitoring.  Procedural Findings:  Hemodynamics:  RA 15 RV 57/17 PA 59/28 (45) PCWP Mean 36 LV 157/22 EDP 32 AO 148/88 AoV Gradient mean 12.7  AoV Area 0.84 cm2   Oxygen saturations: PA 53% AO 90%  Cardiac Output (Fick) 3.03 Cardiac Index (Fick) 2.03  Coronary angiography:  Coronary dominance: right  Left mainstem: Normal  Left anterior descending (LAD): Wraps the apex. Mild mid 25% stenosis. D1 is large with luminal irregularities.   Left circumflex (LCx): AV groove proximal 30% after large MOM. MOM large branching and normal. OM2 moderate sized and normal.  Right coronary artery (RCA): High anterior take up best approached with an Amplatz left catheter. Normal.   Left ventriculography: Left ventricle was not injected secondary to high EDP. I did cross the valve for pressures. Despite significant difficulty crossing the valve there was not a high measured gradient.  AO Root: Mild AI. There did appear to be aortic root  dilatation given the small size of the patient.   Final Conclusions: Mild coronary plaque. LV gradient mild but might be underestimated given estimate of LV function on echo. Mild AI by cath. Elevated EDP and significantly elevated pulmonary pressures. O2 sats were low during the procedure.    Recommendations: I will order PFTs. She will need to see pulmonary. She needs to stop smoking. I will consider a TEE after this to further evaluate her valve. She would benefit from diuresis and salt restriction.    Minus Breeding 09/23/2014, 11:15 AM       *[facility]*            [address], [place] [address]              [phone]  ------------------------------------------------------------------- Stress Echocardiography  Patient:  Carol Warren, Carol Warren MR #:    [id number] Study Date: 11/22/2014 Gender:   F Age:    33 Height:   154.9 cm Weight:   49.9 kg BSA:    1.47 m^2 Pt. Status: Room:  ATTENDING  [facility] SONOGRAPHER Cindy Hazy, RDCS PERFORMING  [facility], Outpatient  cc:  ------------------------------------------------------------------- LV EF: 30% -  35%  ------------------------------------------------------------------- Indications:   I 35.0 Aortic Stenosis. Dobutamine Stress Echo to Evaluate AS  ------------------------------------------------------------------- History:  PMH: Pulmonary Hypertension. Bicuspid Aortic Valve. COPD. Aortic Aneurysm. Medications: No other medications.  ------------------------------------------------------------------- Study Conclusions  - HPI and indications: I 35.0 Aortic Stenosis. Dobutamine Stress Echo to Evaluate AS - Left ventricle: Systolic function was moderately to severely reduced. The estimated ejection fraction was in the range of 30% to 35%. - Aortic valve: Moderately calcified with reduced leaflet excursion. Mild regurgitation. Peak and mean gradients at rest were 53 and 33 mmHg, respectively. Based on an LVOT diameter of 2.1 cm, the calculated AVA is 0.8-0.9 cm2, suggesting severe aortic stenosis. There was a mild  drop in transvalvular gradient on low dose dobutamine at 5 ug/kg/min. At 10 ug/kg/min infusion, the peak and mean gradients were 63 and 46 mmHg. At 20 ug/kg/min, the gradient increased to 76 and 52 mmHg, respectively.  Impressions:  - Findings consistent with low gradient, low flow - true aortic stenosis. There is contractile reserve and LV gradients increased with dobutamine, suggesting that the aortic stenosis is severe. LV function will likely improve with aortic valve replacement.  Dobutamine. Stress echocardiography. 2D. Birthdate: Patient birthdate: [birth year]. Age: Patient is 69 yr old. Sex: Gender: female.  BMI: 20.8 kg/m^2. Blood pressure:   122/76 Patient status: Outpatient. Study date: Study date: 11/22/2014. Study time: 03:26 PM.  -------------------------------------------------------------------  ------------------------------------------------------------------- Left ventricle: Systolic function was moderately to severely reduced. The estimated ejection fraction was in the range of 30% to 35%.  ------------------------------------------------------------------- Aortic valve: Moderately calcified with reduced leaflet excursion. Mild regurgitation. Peak and mean gradients at rest were 53 and 33 mmHg, respectively. Based on an LVOT diameter of 2.1 cm, the calculated AVA is 0.8-0.9 cm2, suggesting severe aortic stenosis. There was a mild drop in transvalvular gradient on low dose dobutamine at 5 ug/kg/min. At 10 ug/kg/min infusion, the peak and mean gradients were 63 and 46 mmHg. At 20 ug/kg/min, the gradient increased to 76 and 52 mmHg, respectively. Doppler:   VTI ratio of LVOT to aortic valve: 0.27. Valve area (VTI): 0.93 cm^2. Indexed valve area (VTI): 0.63 cm^2/m^2. Mean velocity ratio of LVOT to aortic valve:  0.21. Valve area (Vmean): 0.71 cm^2. Indexed valve area (Vmean): 0.49 cm^2/m^2.  Mean gradient (S): 42 mm  Hg.  ------------------------------------------------------------------- Stress protocol:  +-----------------------+---+------------+---+--------+ Stage         HR BP (mmHg)  SatSymptoms +-----------------------+---+------------+---+--------+ Baseline        105122/76 (91) 98%None   +-----------------------+---+------------+---+--------+ Dobutamine 5 ug/kg/min 88 143/83 (103)94%None   +-----------------------+---+------------+---+--------+ Dobutamine 10 ug/kg/min89 144/79 (101)89%None   +-----------------------+---+------------+---+--------+ Dobutamine 20 ug/kg/min111154/78 (103)94%None   +-----------------------+---+------------+---+--------+ Immediate post stress 104163/83 (110)94%None   +-----------------------+---+------------+---+--------+ Recovery; 1 min    103---------------None   +-----------------------+---+------------+---+--------+ Recovery; 2 min    104---------------None   +-----------------------+---+------------+---+--------+ Recovery; 3 min    99 156/84 (108)92%None   +-----------------------+---+------------+---+--------+ Recovery; 4 min    96 ---------------None   +-----------------------+---+------------+---+--------+ Recovery; 5 min    95 142/82 (102)92%None   +-----------------------+---+------------+---+--------+ Recovery; 6 min    92 ---------------None   +-----------------------+---+------------+---+--------+ Recovery; 7 min    86 144/82 (103)93%None   +-----------------------+---+------------+---+--------+  ------------------------------------------------------------------- Stress results:  Maximal heart rate during stress was 111 bpm (73% of maximal predicted heart rate). The maximal predicted heart rate was 153 bpm. There was a normal resting blood pressure. Normal blood pressure response to dobutamine.  The rate-pressure product for the peak heart rate and blood pressure was 17094 mm Hg/min.  ------------------------------------------------------------------- Measurements  Left ventricle              Value Stroke volume, 2D            64  ml Stroke volume/bsa, 2D          44  ml/m^2  LVOT                   Value LVOT ID, S                21  mm LVOT area                3.46 cm^2 LVOT mean velocity, S          61.9 cm/s LVOT VTI, S               18.6 cm  Aortic valve               Value Aortic valve mean velocity, S      301  cm/s Aortic valve VTI, S           68.9 cm Aortic mean gradient, S         42  mm Hg VTI ratio, LVOT/AV            0.27 Aortic valve area, VTI          0.93 cm^2 Aortic valve area/bsa, VTI        0.63 cm^2/m^2 Velocity ratio, mean, LVOT/AV      0.21 Aortic valve area, mean velocity     0.71 cm^2 Aortic valve area/bsa, mean velocity   0.49 cm^2/m^2 Aortic regurg pressure half-time     269  ms  Legend: (L) and (H) mark values outside specified reference range.  ------------------------------------------------------------------- Prepared and Electronically Authenticated by  Lyman Bishop MD 2015-11-24T17:53:41  CLINICAL DATA: Bicuspid aortic valve  EXAM: CT ANGIOGRAPHY CHEST WITH CONTRAST  TECHNIQUE: Multidetector CT imaging of the chest was performed using the standard protocol during bolus administration of intravenous contrast. Multiplanar CT image reconstructions and MIPs were obtained to evaluate the vascular anatomy.  CONTRAST: 49mL OMNIPAQUE IOHEXOL 350 MG/ML SOLN  COMPARISON: 06/05/2012  FINDINGS: Maximal aortic diameter  is at the sinus of Valsalva, sino-tubular junction, and ascending aorta are 4.7  cm, 3.5 cm, and 4.0 cm respectively.  No evidence of aortic dissection or intramural hematoma.  Mild atherosclerotic calcifications in the arch. LAD and circumflex coronary artery calcifications. Moderate aortic valve calcifications.  Innominate artery, right subclavian artery, right common carotid artery, diminutive right vertebral artery, left common carotid artery, left subclavian artery, and dominant left vertebral artery are patent.  No obvious filling defect in the pulmonary arterial tree to suggest acute pulmonary thromboembolism.  Mild diffuse atherosclerotic plaque involving the descending thoracic aorta.  No abnormal mediastinal adenopathy. No pericardial effusion. Small mediastinal nodes are present.  No pneumothorax. No pleural effusion.  Linear atelectasis at the left lung base. Mild emphysema.  No destructive bone lesion.  Visualized upper abdomen is benign. Benign right adrenal adenoma is noted.  Review of the MIP images confirms the above findings.   Electronically Signed  By: Maryclare Bean M.D.  On: 06/30/2014 12:20     Ref Range 83mo ago    FVC-Pre L 2.28   FVC-%Pred-Pre % 84   FVC-Post L 2.21   FVC-%Pred-Post % 81   FVC-%Change-Post % -3   FEV1-Pre L 1.86   FEV1-%Pred-Pre % 90   FEV1-Post L 1.85   FEV1-%Pred-Post % 89   FEV1-%Change-Post % 0   FEV6-Pre L 2.28   FEV6-%Pred-Pre % 87   FEV6-Post L 2.20   FEV6-%Pred-Post % 84   FEV6-%Change-Post % -3   Pre FEV1/FVC ratio % 82   FEV1FVC-%Pred-Pre % 107   Post FEV1/FVC ratio % 84   FEV1FVC-%Change-Post % 2   Pre FEV6/FVC Ratio % 100   FEV6FVC-%Pred-Pre % 103   Post FEV6/FVC ratio % 100   FEV6FVC-%Pred-Post % 103   FEV6FVC-%Change-Post % 0   FEF 25-75 Pre L/sec 1.93   FEF2575-%Pred-Pre % 104   FEF 25-75 Post L/sec 1.89    FEF2575-%Pred-Post % 102   FEF2575-%Change-Post % -2   RV L 1.59   RV % pred % 79   TLC L 4.07   TLC % pred % 88   DLCO unc ml/min/mmHg 13.38   DLCO unc % pred % 66   DL/VA ml/min/mmHg/L 3.19   DL/VA % pred % 72   Resulting Agency [facility]    Specimen Collected: 11/09/14 11:57 AM Last Resulted: 11/09/14 12:43 PM       Impression:  She has low gradient severe bicuspid aortic stenosis with moderate to severe LV dysfunction that is symptomatic with exertional fatigue and shortness of breath. She also has an aortic root and ascending aortic aneurysm with a maximum diameter at the sinus portion of 4.7 cm. I think aortic valve and root replacement with replacement of the ascending aorta is indicated to relieve her symptoms and to prevent progressive LV deterioration. She is 85 and has fairly severe DJD and uses frequent NSAID's so a tissue valve would be best for her. She saw her dentist recently and says that he made sure that there were no active problems with her teeth.  I discussed the operative procedure with the patient including alternatives, benefits and risks; including but not limited to bleeding, blood transfusion, infection, stroke, myocardial infarction, heart block requiring a permanent pacemaker, organ dysfunction, and death. Bonita A Dupee understands and agrees to proceed.    Plan:  Aortic valve, root and ascending aortic replacement using a tissue valve.

## 2015-01-12 NOTE — Anesthesia Procedure Notes (Signed)
Procedure Name: Intubation Date/Time: 01/12/2015 8:14 AM Performed by: Melina Copa, Yobany Vroom R Pre-anesthesia Checklist: Patient identified, Emergency Drugs available, Suction available, Patient being monitored and Timeout performed Patient Re-evaluated:Patient Re-evaluated prior to inductionOxygen Delivery Method: Circle system utilized Preoxygenation: Pre-oxygenation with 100% oxygen Intubation Type: IV induction Ventilation: Mask ventilation without difficulty Laryngoscope Size: Mac and 3 Grade View: Grade II Tube type: Oral Tube size: 8.0 mm Number of attempts: 1 Airway Equipment and Method: Stylet Placement Confirmation: ETT inserted through vocal cords under direct vision,  positive ETCO2 and breath sounds checked- equal and bilateral Secured at: 22 cm Tube secured with: Tape Dental Injury: Teeth and Oropharynx as per pre-operative assessment

## 2015-01-12 NOTE — OR Nursing (Signed)
SICU Notification: 1st call [phone]

## 2015-01-12 NOTE — Interval H&P Note (Signed)
History and Physical Interval Note:  01/12/2015 6:02 AM  Carol Warren  has presented today for surgery, with the diagnosis of SEVERE AS TAA  The various methods of treatment have been discussed with the patient and family. After consideration of risks, benefits and other options for treatment, the patient has consented to  Procedure(s): AORTIC VALVE REPLACEMENT (AVR) (N/A) ASCENDING AORTIC ROOT REPLACEMENT (N/A) TRANSESOPHAGEAL ECHOCARDIOGRAM (TEE) (N/A) as a surgical intervention .  The patient's history has been reviewed, patient examined, no change in status, stable for surgery.  I have reviewed the patient's chart and labs.  Questions were answered to the patient's satisfaction.     Gaye Pollack

## 2015-01-12 NOTE — Procedures (Signed)
Extubation Procedure Note  Patient Details:   Name: Carol Warren DOB: 09-15-1946 MRN: [id number]   Airway Documentation:     Evaluation  O2 sats: stable throughout Complications: No apparent complications Patient did tolerate procedure well. Bilateral Breath Sounds: Clear, Diminished Suctioning: Airway Yes  IS 867ml 4l/min [place]  Revonda Standard 01/12/2015, 5:25 PM

## 2015-01-12 NOTE — Progress Notes (Signed)
*  PRELIMINARY RESULTS* Echocardiogram Echocardiogram Transesophageal has been performed.  Leavy Cella 01/12/2015, 10:30 AM

## 2015-01-12 NOTE — Anesthesia Preprocedure Evaluation (Addendum)
Anesthesia Evaluation  Patient identified by MRN, date of birth, ID band Patient awake    Reviewed: Allergy & Precautions, NPO status , Patient's Chart, lab work & pertinent test results, reviewed documented beta blocker date and time   History of Anesthesia Complications (+) PONV  Airway Mallampati: II  TM Distance: >3 FB Neck ROM: Full    Dental  (+) Teeth Intact, Missing, Dental Advisory Given   Pulmonary shortness of breath and with exertion, asthma , COPD COPD inhaler, Current Smoker,  breath sounds clear to auscultation        Cardiovascular + Peripheral Vascular Disease Rhythm:Regular Rate:Normal     Neuro/Psych    GI/Hepatic Neg liver ROS, GERD-  ,  Endo/Other  negative endocrine ROS  Renal/GU negative Renal ROS     Musculoskeletal   Abdominal   Peds  Hematology   Anesthesia Other Findings   Reproductive/Obstetrics                            Anesthesia Physical Anesthesia Plan  ASA: III  Anesthesia Plan: General   Post-op Pain Management:    Induction: Intravenous  Airway Management Planned: Oral ETT  Additional Equipment: Arterial line, CVP, PA Cath, TEE, 3D TEE and Ultrasound Guidance Line Placement  Intra-op Plan:   Post-operative Plan: Extubation in OR  Informed Consent: I have reviewed the patients History and Physical, chart, labs and discussed the procedure including the risks, benefits and alternatives for the proposed anesthesia with the patient or authorized representative who has indicated his/her understanding and acceptance.   Dental advisory given  Plan Discussed with: CRNA, Anesthesiologist and Surgeon  Anesthesia Plan Comments:        Anesthesia Quick Evaluation

## 2015-01-12 NOTE — Anesthesia Postprocedure Evaluation (Signed)
  Anesthesia Post-op Note  Patient: Carol Warren  Procedure(s) Performed: Procedure(s): AORTIC VALVE REPLACEMENT (AVR) (N/A) ASCENDING AORTIC ROOT REPLACEMENT (N/A) TRANSESOPHAGEAL ECHOCARDIOGRAM (TEE) (N/A)  Patient Location: PACU and SICU  Anesthesia Type:General  Level of Consciousness: sedated  Airway and Oxygen Therapy: Patient remains intubated per anesthesia plan  Post-op Pain: mild  Post-op Assessment: Post-op Vital signs reviewed  Post-op Vital Signs: Reviewed  Last Vitals:  Filed Vitals:   01/12/15 1530  BP:   Pulse: 91  Temp: 35.5 C  Resp: 15    Complications: No apparent anesthesia complications

## 2015-01-13 ENCOUNTER — Inpatient Hospital Stay (HOSPITAL_COMMUNITY): Payer: Medicare Other

## 2015-01-13 LAB — GLUCOSE, CAPILLARY
GLUCOSE-CAPILLARY: 68 mg/dL — AB (ref 70–99)
GLUCOSE-CAPILLARY: 86 mg/dL (ref 70–99)
Glucose-Capillary: 121 mg/dL — ABNORMAL HIGH (ref 70–99)
Glucose-Capillary: 171 mg/dL — ABNORMAL HIGH (ref 70–99)
Glucose-Capillary: 215 mg/dL — ABNORMAL HIGH (ref 70–99)
Glucose-Capillary: 102 mg/dL — ABNORMAL HIGH (ref 70–99)
Glucose-Capillary: 77 mg/dL (ref 70–99)
Glucose-Capillary: 121 mg/dL — ABNORMAL HIGH (ref 70–99)
Glucose-Capillary: 84 mg/dL (ref 70–99)
Glucose-Capillary: 125 mg/dL — ABNORMAL HIGH (ref 70–99)
Glucose-Capillary: 133 mg/dL — ABNORMAL HIGH (ref 70–99)

## 2015-01-13 LAB — CBC
HCT: 25.7 % — ABNORMAL LOW (ref 36.0–46.0)
HCT: 27.9 % — ABNORMAL LOW (ref 36.0–46.0)
HEMOGLOBIN: 9.3 g/dL — AB (ref 12.0–15.0)
Hemoglobin: 8.8 g/dL — ABNORMAL LOW (ref 12.0–15.0)
MCH: 29.8 pg (ref 26.0–34.0)
MCH: 29.8 pg (ref 26.0–34.0)
MCHC: 34.2 g/dL (ref 30.0–36.0)
MCHC: 33.3 g/dL (ref 30.0–36.0)
MCV: 87.1 fL (ref 78.0–100.0)
MCV: 89.4 fL (ref 78.0–100.0)
PLATELETS: 131 10*3/uL — AB (ref 150–400)
Platelets: 125 10*3/uL — ABNORMAL LOW (ref 150–400)
RBC: 2.95 MIL/uL — ABNORMAL LOW (ref 3.87–5.11)
RBC: 3.12 MIL/uL — ABNORMAL LOW (ref 3.87–5.11)
RDW: 16.6 % — ABNORMAL HIGH (ref 11.5–15.5)
RDW: 16.9 % — ABNORMAL HIGH (ref 11.5–15.5)
WBC: 12.1 10*3/uL — ABNORMAL HIGH (ref 4.0–10.5)
WBC: 15 10*3/uL — ABNORMAL HIGH (ref 4.0–10.5)

## 2015-01-13 LAB — MAGNESIUM
MAGNESIUM: 2.7 mg/dL — AB (ref 1.5–2.5)
Magnesium: 2.2 mg/dL (ref 1.5–2.5)

## 2015-01-13 LAB — BASIC METABOLIC PANEL
Anion gap: 8 (ref 5–15)
BUN: 8 mg/dL (ref 6–23)
CO2: 21 mmol/L (ref 19–32)
Calcium: 7.4 mg/dL — ABNORMAL LOW (ref 8.4–10.5)
Chloride: 106 mEq/L (ref 96–112)
Creatinine, Ser: 0.66 mg/dL (ref 0.50–1.10)
GFR calc non Af Amer: 89 mL/min — ABNORMAL LOW (ref >90)
Glucose, Bld: 85 mg/dL (ref 70–99)
Potassium: 3.9 mmol/L (ref 3.5–5.1)
Sodium: 135 mmol/L (ref 135–145)

## 2015-01-13 LAB — POCT I-STAT, CHEM 8
BUN: 11 mg/dL (ref 6–23)
CALCIUM ION: 1.11 mmol/L — AB (ref 1.13–1.30)
CREATININE: 0.7 mg/dL (ref 0.50–1.10)
Chloride: 98 mEq/L (ref 96–112)
Glucose, Bld: 134 mg/dL — ABNORMAL HIGH (ref 70–99)
HCT: 29 % — ABNORMAL LOW (ref 36.0–46.0)
Hemoglobin: 9.9 g/dL — ABNORMAL LOW (ref 12.0–15.0)
Potassium: 4 mmol/L (ref 3.5–5.1)
Sodium: 136 mmol/L (ref 135–145)
TCO2: 22 mmol/L (ref 0–100)

## 2015-01-13 LAB — CREATININE, SERUM
Creatinine, Ser: 0.85 mg/dL (ref 0.50–1.10)
GFR calc Af Amer: 80 mL/min — ABNORMAL LOW (ref >90)
GFR, EST NON AFRICAN AMERICAN: 69 mL/min — AB (ref >90)

## 2015-01-13 MED ORDER — INSULIN ASPART 100 UNIT/ML ~~LOC~~ SOLN
0.0000 [IU] | SUBCUTANEOUS | Status: DC
  Administered 2015-01-13 – 2015-01-14 (×4): 2 [IU] via SUBCUTANEOUS

## 2015-01-13 MED ORDER — FUROSEMIDE 10 MG/ML IJ SOLN
40.0000 mg | Freq: Once | INTRAMUSCULAR | Status: AC
  Administered 2015-01-13: 40 mg via INTRAVENOUS
  Filled 2015-01-13: qty 4

## 2015-01-13 MED ORDER — POTASSIUM CHLORIDE CRYS ER 20 MEQ PO TBCR
20.0000 meq | EXTENDED_RELEASE_TABLET | Freq: Two times a day (BID) | ORAL | Status: AC
  Administered 2015-01-13 (×2): 20 meq via ORAL
  Filled 2015-01-13 (×2): qty 1

## 2015-01-13 MED ORDER — NICOTINE 21 MG/24HR TD PT24
21.0000 mg | MEDICATED_PATCH | Freq: Every day | TRANSDERMAL | Status: DC
  Administered 2015-01-13 – 2015-01-20 (×8): 21 mg via TRANSDERMAL
  Filled 2015-01-13 (×9): qty 1

## 2015-01-13 MED ORDER — ENOXAPARIN SODIUM 40 MG/0.4ML ~~LOC~~ SOLN
40.0000 mg | Freq: Every day | SUBCUTANEOUS | Status: DC
  Administered 2015-01-13 – 2015-01-19 (×6): 40 mg via SUBCUTANEOUS
  Filled 2015-01-13 (×8): qty 0.4

## 2015-01-13 NOTE — Plan of Care (Signed)
Problem: Phase II - Intermediate Post-Op Goal: CBGs/Blood Glucose per SCIP Criteria Outcome: Progressing Pt on insulin gtt

## 2015-01-13 NOTE — Progress Notes (Signed)
1 Day Post-Op Procedure(s) (LRB): AORTIC VALVE REPLACEMENT (AVR) (N/A) ASCENDING AORTIC ROOT REPLACEMENT (N/A) TRANSESOPHAGEAL ECHOCARDIOGRAM (TEE) (N/A) Subjective:  No complaints  Objective: Vital signs in last 24 hours: Temp:  [94.5 F (34.7 C)-98.8 F (37.1 C)] 98.8 F (37.1 C) (01/15 0700) Pulse Rate:  [85-106] 102 (01/15 0700) Cardiac Rhythm:  [-] A-V Sequential paced (01/15 0000) Resp:  [14-29] 19 (01/15 0700) BP: (82-110)/(53-69) 109/57 mmHg (01/15 0700) SpO2:  [90 %-100 %] 97 % (01/15 0700) Arterial Line BP: (78-133)/(48-68) 121/57 mmHg (01/15 0700) FiO2 (%):  [40 %-50 %] 40 % (01/14 1622) Weight:  [52.5 kg (115 lb 11.9 oz)] 52.5 kg (115 lb 11.9 oz) (01/15 0500)  Hemodynamic parameters for last 24 hours: PAP: (19-34)/(7-22) 31/16 mmHg CO:  [2.4 L/min-3.8 L/min] 3.6 L/min CI:  [1.6 L/min/m2-2.6 L/min/m2] 2.5 L/min/m2  Intake/Output from previous day: 01/14 0701 - 01/15 0700 In: 5748.6 [P.O.:400; I.V.:3538.6; Blood:410; IV Piggyback:1400] Out: 7858 [Urine:3250; Blood:1420; Chest Tube:310] Intake/Output this shift:    General appearance: alert and cooperative Neurologic: intact Heart: regular rate and rhythm, S1, S2 normal, no murmur, click, rub or gallop Lungs: clear to auscultation bilaterally Extremities: edema mild Wound: dressing dry  Lab Results:  Recent Labs  01/12/15 2147 01/13/15 0340  WBC 10.9* 12.1*  HGB 8.7* 8.8*  HCT 25.2* 25.7*  PLT 129* 125*   BMET:  Recent Labs  01/11/15 0906  01/12/15 1958 01/12/15 2147 01/13/15 0340  NA 130*  < > 138  --  135  K 4.1  < > 4.3  --  3.9  CL 98  < > 105  --  106  CO2 20  --   --   --  21  GLUCOSE 99  < > 180*  --  85  BUN 14  < > 11  --  8  CREATININE 0.96  < > 0.60 0.67 0.66  CALCIUM 9.0  --   --   --  7.4*  < > = values in this interval not displayed.  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  Remove chest tubes, swan, A-line  OOB   LOS: 1 day    Carol Warren K 01/13/2015

## 2015-01-13 NOTE — Progress Notes (Signed)
Pt's urine output over the last four hours has decreased to 10-20 cc/hr. BP 105/57. Dr. Prescott Gum notified. Order received for Lasix 40 mg IV x1. Will give and continue to monitor.  Vella Raring, RN

## 2015-01-13 NOTE — Progress Notes (Signed)
CT surgery p.m. Rounds  Patient examined and record reviewed.Hemodynamics stable,labs satisfactory.Patient had stable day.Continue current care. VAN TRIGT III,PETER 01/13/2015

## 2015-01-14 ENCOUNTER — Inpatient Hospital Stay (HOSPITAL_COMMUNITY): Payer: Medicare Other

## 2015-01-14 LAB — GLUCOSE, CAPILLARY
Glucose-Capillary: 112 mg/dL — ABNORMAL HIGH (ref 70–99)
Glucose-Capillary: 125 mg/dL — ABNORMAL HIGH (ref 70–99)
Glucose-Capillary: 142 mg/dL — ABNORMAL HIGH (ref 70–99)
Glucose-Capillary: 152 mg/dL — ABNORMAL HIGH (ref 70–99)
Glucose-Capillary: 78 mg/dL (ref 70–99)

## 2015-01-14 LAB — BASIC METABOLIC PANEL WITH GFR
Anion gap: 7 (ref 5–15)
BUN: 10 mg/dL (ref 6–23)
CO2: 28 mmol/L (ref 19–32)
Calcium: 7.8 mg/dL — ABNORMAL LOW (ref 8.4–10.5)
Chloride: 99 meq/L (ref 96–112)
Creatinine, Ser: 0.8 mg/dL (ref 0.50–1.10)
GFR calc Af Amer: 86 mL/min — ABNORMAL LOW (ref >90)
GFR calc non Af Amer: 75 mL/min — ABNORMAL LOW (ref >90)
Glucose, Bld: 89 mg/dL (ref 70–99)
Potassium: 4.3 mmol/L (ref 3.5–5.1)
Sodium: 134 mmol/L — ABNORMAL LOW (ref 135–145)

## 2015-01-14 LAB — CBC
HCT: 26.5 % — ABNORMAL LOW (ref 36.0–46.0)
Hemoglobin: 8.9 g/dL — ABNORMAL LOW (ref 12.0–15.0)
MCH: 30 pg (ref 26.0–34.0)
MCHC: 33.6 g/dL (ref 30.0–36.0)
MCV: 89.2 fL (ref 78.0–100.0)
Platelets: 139 K/uL — ABNORMAL LOW (ref 150–400)
RBC: 2.97 MIL/uL — ABNORMAL LOW (ref 3.87–5.11)
RDW: 17 % — ABNORMAL HIGH (ref 11.5–15.5)
WBC: 12 K/uL — ABNORMAL HIGH (ref 4.0–10.5)

## 2015-01-14 MED ORDER — FLUTICASONE PROPIONATE 50 MCG/ACT NA SUSP
2.0000 | Freq: Every evening | NASAL | Status: DC | PRN
  Administered 2015-01-14 – 2015-01-19 (×5): 2 via NASAL
  Filled 2015-01-14: qty 16

## 2015-01-14 MED ORDER — INFLUENZA VAC SPLIT QUAD 0.5 ML IM SUSY
0.5000 mL | PREFILLED_SYRINGE | INTRAMUSCULAR | Status: AC
  Administered 2015-01-15: 0.5 mL via INTRAMUSCULAR
  Filled 2015-01-14: qty 0.5

## 2015-01-14 MED ORDER — LEVALBUTEROL HCL 1.25 MG/0.5ML IN NEBU
1.2500 mg | INHALATION_SOLUTION | Freq: Four times a day (QID) | RESPIRATORY_TRACT | Status: DC
  Administered 2015-01-14: 1.25 mg via RESPIRATORY_TRACT
  Filled 2015-01-14 (×4): qty 0.5

## 2015-01-14 MED ORDER — BUDESONIDE-FORMOTEROL FUMARATE 160-4.5 MCG/ACT IN AERO
2.0000 | INHALATION_SPRAY | Freq: Two times a day (BID) | RESPIRATORY_TRACT | Status: DC
  Administered 2015-01-15 – 2015-01-16 (×3): 2 via RESPIRATORY_TRACT
  Filled 2015-01-14: qty 6

## 2015-01-14 MED ORDER — HYDROMORPHONE HCL 2 MG PO TABS
1.0000 mg | ORAL_TABLET | Freq: Four times a day (QID) | ORAL | Status: DC | PRN
  Administered 2015-01-14 – 2015-01-15 (×2): 1 mg via ORAL
  Filled 2015-01-14 (×2): qty 1

## 2015-01-14 MED ORDER — DM-GUAIFENESIN ER 30-600 MG PO TB12
1.0000 | ORAL_TABLET | Freq: Two times a day (BID) | ORAL | Status: DC | PRN
  Filled 2015-01-14: qty 1

## 2015-01-14 MED ORDER — LEVALBUTEROL HCL 1.25 MG/0.5ML IN NEBU
1.2500 mg | INHALATION_SOLUTION | Freq: Four times a day (QID) | RESPIRATORY_TRACT | Status: DC | PRN
  Filled 2015-01-14: qty 0.5

## 2015-01-14 MED ORDER — FUROSEMIDE 40 MG PO TABS
40.0000 mg | ORAL_TABLET | Freq: Every day | ORAL | Status: DC
  Administered 2015-01-14 – 2015-01-15 (×2): 40 mg via ORAL
  Filled 2015-01-14 (×4): qty 1

## 2015-01-14 MED ORDER — PNEUMOCOCCAL VAC POLYVALENT 25 MCG/0.5ML IJ INJ
0.5000 mL | INJECTION | INTRAMUSCULAR | Status: AC
  Administered 2015-01-15: 0.5 mL via INTRAMUSCULAR
  Filled 2015-01-14: qty 0.5

## 2015-01-14 MED ORDER — TRAZODONE HCL 50 MG PO TABS
50.0000 mg | ORAL_TABLET | Freq: Every day | ORAL | Status: DC
  Administered 2015-01-14 – 2015-01-19 (×6): 50 mg via ORAL
  Filled 2015-01-14 (×7): qty 1

## 2015-01-14 NOTE — Progress Notes (Signed)
2 Days Post-Op Procedure(s) (LRB): AORTIC VALVE REPLACEMENT (AVR) (N/A) ASCENDING AORTIC ROOT REPLACEMENT (N/A) TRANSESOPHAGEAL ECHOCARDIOGRAM (TEE) (N/A) Subjective: Some shoulder pain Heart block on DDD pacer COPD compensated, CXR clear  Objective: Vital signs in last 24 hours: Temp:  [97.5 F (36.4 C)-98.2 F (36.8 C)] 97.6 F (36.4 C) (01/16 0800) Pulse Rate:  [32-102] 91 (01/16 0900) Cardiac Rhythm:  [-] A-V Sequential paced (01/16 0400) Resp:  [13-27] 22 (01/16 0900) BP: (84-118)/(50-67) 118/53 mmHg (01/16 0900) SpO2:  [91 %-100 %] 95 % (01/16 0900) Weight:  [112 lb 3.2 oz (50.894 kg)] 112 lb 3.2 oz (50.894 kg) (01/16 0500)  Hemodynamic parameters for last 24 hours:  paced  Intake/Output from previous day: 01/15 0701 - 01/16 0700 In: 790 [P.O.:240; I.V.:450; IV Piggyback:100] Out: 1835 [IWPYK:9983; Chest Tube:100] Intake/Output this shift:    Lungs clear extrem warm  Lab Results:  Recent Labs  01/13/15 1600 01/13/15 1620 01/14/15 0415  WBC 15.0*  --  12.0*  HGB 9.3* 9.9* 8.9*  HCT 27.9* 29.0* 26.5*  PLT 131*  --  139*   BMET:  Recent Labs  01/13/15 0340  01/13/15 1620 01/14/15 0415  NA 135  --  136 134*  K 3.9  --  4.0 4.3  CL 106  --  98 99  CO2 21  --   --  28  GLUCOSE 85  --  134* 89  BUN 8  --  11 10  CREATININE 0.66  < > 0.70 0.80  CALCIUM 7.4*  --   --  7.8*  < > = values in this interval not displayed.  PT/INR:  Recent Labs  01/12/15 1300  LABPROT 17.4*  INR 1.41   ABG    Component Value Date/Time   PHART 7.352 01/12/2015 1856   HCO3 22.0 01/12/2015 1856   TCO2 22 01/13/2015 1620   ACIDBASEDEF 3.0* 01/12/2015 1856   O2SAT 98.0 01/12/2015 1856   CBG (last 3)   Recent Labs  01/13/15 2350 01/14/15 0429 01/14/15 0855  GLUCAP 125* 78 112*    Assessment/Plan: S/P Procedure(s) (LRB): AORTIC VALVE REPLACEMENT (AVR) (N/A) ASCENDING AORTIC ROOT REPLACEMENT (N/A) TRANSESOPHAGEAL ECHOCARDIOGRAM (TEE) (N/A) Keep in ICU for  heart block   LOS: 2 days    VAN TRIGT III,Trina Asch 01/14/2015

## 2015-01-15 ENCOUNTER — Inpatient Hospital Stay (HOSPITAL_COMMUNITY): Payer: Medicare Other

## 2015-01-15 DIAGNOSIS — I350 Nonrheumatic aortic (valve) stenosis: Secondary | ICD-10-CM | POA: Diagnosis not present

## 2015-01-15 LAB — BASIC METABOLIC PANEL
Anion gap: 6 (ref 5–15)
BUN: 11 mg/dL (ref 6–23)
CO2: 30 mmol/L (ref 19–32)
Calcium: 7.8 mg/dL — ABNORMAL LOW (ref 8.4–10.5)
Chloride: 95 mEq/L — ABNORMAL LOW (ref 96–112)
Creatinine, Ser: 0.76 mg/dL (ref 0.50–1.10)
GFR calc Af Amer: >90 mL/min (ref >90)
GFR calc non Af Amer: 85 mL/min — ABNORMAL LOW (ref >90)
Glucose, Bld: 116 mg/dL — ABNORMAL HIGH (ref 70–99)
Potassium: 3.7 mmol/L (ref 3.5–5.1)
Sodium: 131 mmol/L — ABNORMAL LOW (ref 135–145)

## 2015-01-15 LAB — CBC
HCT: 22.2 % — ABNORMAL LOW (ref 36.0–46.0)
Hemoglobin: 7.7 g/dL — ABNORMAL LOW (ref 12.0–15.0)
MCH: 30.7 pg (ref 26.0–34.0)
MCHC: 34.7 g/dL (ref 30.0–36.0)
MCV: 88.4 fL (ref 78.0–100.0)
Platelets: 129 10*3/uL — ABNORMAL LOW (ref 150–400)
RBC: 2.51 MIL/uL — ABNORMAL LOW (ref 3.87–5.11)
RDW: 16.4 % — ABNORMAL HIGH (ref 11.5–15.5)
WBC: 8.7 10*3/uL (ref 4.0–10.5)

## 2015-01-15 LAB — GLUCOSE, CAPILLARY: GLUCOSE-CAPILLARY: 140 mg/dL — AB (ref 70–99)

## 2015-01-15 MED ORDER — FE FUMARATE-B12-VIT C-FA-IFC PO CAPS
1.0000 | ORAL_CAPSULE | Freq: Three times a day (TID) | ORAL | Status: DC
  Administered 2015-01-15 – 2015-01-20 (×13): 1 via ORAL
  Filled 2015-01-15 (×22): qty 1

## 2015-01-15 MED ORDER — DIAZEPAM 5 MG PO TABS
5.0000 mg | ORAL_TABLET | Freq: Two times a day (BID) | ORAL | Status: DC | PRN
  Administered 2015-01-15 – 2015-01-20 (×5): 5 mg via ORAL
  Filled 2015-01-15 (×6): qty 1

## 2015-01-15 MED ORDER — POTASSIUM CHLORIDE 10 MEQ/50ML IV SOLN
10.0000 meq | INTRAVENOUS | Status: AC
  Administered 2015-01-15 (×3): 10 meq via INTRAVENOUS
  Filled 2015-01-15 (×3): qty 50

## 2015-01-15 MED ORDER — HYDROCODONE-ACETAMINOPHEN 5-325 MG PO TABS
1.0000 | ORAL_TABLET | ORAL | Status: DC | PRN
  Administered 2015-01-16 – 2015-01-20 (×10): 1 via ORAL
  Filled 2015-01-15 (×10): qty 1

## 2015-01-15 NOTE — Progress Notes (Signed)
3 Days Post-Op Procedure(s) (LRB): AORTIC VALVE REPLACEMENT (AVR) (N/A) ASCENDING AORTIC ROOT REPLACEMENT (N/A) TRANSESOPHAGEAL ECHOCARDIOGRAM (TEE) (N/A) Subjective: Progressing well after bio-Bentall procedure but she remains in heart block Excellent ventricular threshold-less than 2.5 MA on epicardial wire Chest x-ray clear Patient ambulating and tolerating regular diet Keep in ICU because of heart block and pacemaker dependence  Objective: Vital signs in last 24 hours: Temp:  [97.6 F (36.4 C)-98.1 F (36.7 C)] 97.8 F (36.6 C) (01/17 0830) Pulse Rate:  [57-103] 100 (01/17 0900) Cardiac Rhythm:  [-] A-V Sequential paced (01/17 0800) Resp:  [11-26] 18 (01/17 0900) BP: (100-127)/(49-88) 122/88 mmHg (01/17 0900) SpO2:  [90 %-100 %] 96 % (01/17 0900) Weight:  [112 lb 14 oz (51.2 kg)] 112 lb 14 oz (51.2 kg) (01/17 0500)  Hemodynamic parameters for last 24 hours:   stable  Intake/Output from previous day: 01/16 0701 - 01/17 0700 In: 660 [P.O.:480; I.V.:180] Out: 825 [Urine:825] Intake/Output this shift: Total I/O In: 340 [P.O.:240; IV Piggyback:100] Out: -   Sitting up in chair alert and comfortable Lungs clear Minimal edema Pacemaker dependent  Lab Results:  Recent Labs  01/14/15 0415 01/15/15 0353  WBC 12.0* 8.7  HGB 8.9* 7.7*  HCT 26.5* 22.2*  PLT 139* 129*   BMET:  Recent Labs  01/14/15 0415 01/15/15 0353  NA 134* 131*  K 4.3 3.7  CL 99 95*  CO2 28 30  GLUCOSE 89 116*  BUN 10 11  CREATININE 0.80 0.76  CALCIUM 7.8* 7.8*    PT/INR:  Recent Labs  01/12/15 1300  LABPROT 17.4*  INR 1.41   ABG    Component Value Date/Time   PHART 7.352 01/12/2015 1856   HCO3 22.0 01/12/2015 1856   TCO2 22 01/13/2015 1620   ACIDBASEDEF 3.0* 01/12/2015 1856   O2SAT 98.0 01/12/2015 1856   CBG (last 3)   Recent Labs  01/14/15 1154 01/14/15 1605 01/14/15 2140  GLUCAP 152* 142* 140*    Assessment/Plan: S/P Procedure(s) (LRB): AORTIC VALVE REPLACEMENT  (AVR) (N/A) ASCENDING AORTIC ROOT REPLACEMENT (N/A) TRANSESOPHAGEAL ECHOCARDIOGRAM (TEE) (N/A) Hemoglobin shows drop to 7.7 from 8.8-postop expected blood loss anemia Start oral iron and vitamins and check CBC in a.m. Leave patient in ICU with central line because of pacemaker dependence   LOS: 3 days    VAN TRIGT III,PETER 01/15/2015

## 2015-01-16 ENCOUNTER — Inpatient Hospital Stay (HOSPITAL_COMMUNITY): Payer: Medicare Other

## 2015-01-16 DIAGNOSIS — I429 Cardiomyopathy, unspecified: Secondary | ICD-10-CM

## 2015-01-16 DIAGNOSIS — I350 Nonrheumatic aortic (valve) stenosis: Principal | ICD-10-CM

## 2015-01-16 DIAGNOSIS — I4439 Other atrioventricular block: Secondary | ICD-10-CM

## 2015-01-16 LAB — BASIC METABOLIC PANEL
Anion gap: 3 — ABNORMAL LOW (ref 5–15)
BUN: 7 mg/dL (ref 6–23)
CO2: 29 mmol/L (ref 19–32)
Calcium: 8.1 mg/dL — ABNORMAL LOW (ref 8.4–10.5)
Chloride: 102 mEq/L (ref 96–112)
Creatinine, Ser: 0.76 mg/dL (ref 0.50–1.10)
GFR calc Af Amer: >90 mL/min (ref >90)
GFR calc non Af Amer: 85 mL/min — ABNORMAL LOW (ref >90)
Glucose, Bld: 125 mg/dL — ABNORMAL HIGH (ref 70–99)
Potassium: 3.7 mmol/L (ref 3.5–5.1)
Sodium: 134 mmol/L — ABNORMAL LOW (ref 135–145)

## 2015-01-16 LAB — CBC
HCT: 22.4 % — ABNORMAL LOW (ref 36.0–46.0)
Hemoglobin: 7.8 g/dL — ABNORMAL LOW (ref 12.0–15.0)
MCH: 31.5 pg (ref 26.0–34.0)
MCHC: 34.8 g/dL (ref 30.0–36.0)
MCV: 90.3 fL (ref 78.0–100.0)
Platelets: 163 10*3/uL (ref 150–400)
RBC: 2.48 MIL/uL — ABNORMAL LOW (ref 3.87–5.11)
RDW: 16.8 % — ABNORMAL HIGH (ref 11.5–15.5)
WBC: 7.4 10*3/uL (ref 4.0–10.5)

## 2015-01-16 MED ORDER — FUROSEMIDE 20 MG PO TABS
20.0000 mg | ORAL_TABLET | Freq: Every day | ORAL | Status: DC
  Administered 2015-01-16: 20 mg via ORAL
  Filled 2015-01-16 (×3): qty 1

## 2015-01-16 MED ORDER — MAGNESIUM HYDROXIDE 400 MG/5ML PO SUSP
30.0000 mL | Freq: Every day | ORAL | Status: DC | PRN
  Filled 2015-01-16: qty 30

## 2015-01-16 MED ORDER — SODIUM CHLORIDE 0.9 % IJ SOLN: 3.0000 mL | INTRAMUSCULAR | Status: DC | PRN

## 2015-01-16 MED ORDER — MOVING RIGHT ALONG BOOK
Freq: Once | Status: AC
  Administered 2015-01-16: 11:00:00
  Filled 2015-01-16: qty 1

## 2015-01-16 MED ORDER — SODIUM CHLORIDE 0.9 % IJ SOLN
3.0000 mL | Freq: Two times a day (BID) | INTRAMUSCULAR | Status: DC
  Administered 2015-01-17 – 2015-01-20 (×4): 3 mL via INTRAVENOUS

## 2015-01-16 MED ORDER — SODIUM CHLORIDE 0.9 % IV SOLN: 250.0000 mL | INTRAVENOUS | Status: DC | PRN

## 2015-01-16 MED ORDER — POTASSIUM CHLORIDE CRYS ER 10 MEQ PO TBCR
10.0000 meq | EXTENDED_RELEASE_TABLET | Freq: Every day | ORAL | Status: DC
Start: 2015-01-16 — End: 2015-01-17
  Administered 2015-01-16: 10 meq via ORAL
  Filled 2015-01-16 (×2): qty 1

## 2015-01-16 MED FILL — Potassium Chloride Inj 2 mEq/ML: INTRAVENOUS | Qty: 40 | Status: AC

## 2015-01-16 MED FILL — Magnesium Sulfate Inj 50%: INTRAMUSCULAR | Qty: 10 | Status: AC

## 2015-01-16 MED FILL — Dexmedetomidine HCl in NaCl 0.9% IV Soln 400 MCG/100ML: INTRAVENOUS | Qty: 100 | Status: AC

## 2015-01-16 MED FILL — Heparin Sodium (Porcine) Inj 1000 Unit/ML: INTRAMUSCULAR | Qty: 30 | Status: AC

## 2015-01-16 NOTE — Progress Notes (Signed)
4 Days Post-Op Procedure(s) (LRB): AORTIC VALVE REPLACEMENT (AVR) (N/A) ASCENDING AORTIC ROOT REPLACEMENT (N/A) TRANSESOPHAGEAL ECHOCARDIOGRAM (TEE) (N/A) Subjective: Bio-bentall with heart block but underlying rate 70/min Good threshold on EPWs Will tx to stepdown and get EP consult for possible PPM Objective: Vital signs in last 24 hours: Temp:  [97.8 F (36.6 C)-98.9 F (37.2 C)] 98.9 F (37.2 C) (01/18 0300) Pulse Rate:  [99-111] 105 (01/18 0600) Cardiac Rhythm:  [-] A-V Sequential paced (01/18 0739) Resp:  [4-29] 24 (01/18 0700) BP: (105-127)/(56-88) 113/59 mmHg (01/18 0700) SpO2:  [90 %-100 %] 98 % (01/18 0600) Weight:  [111 lb 1.8 oz (50.4 kg)] 111 lb 1.8 oz (50.4 kg) (01/18 0500)  Hemodynamic parameters for last 24 hours:  stable in paced rhythm  Intake/Output from previous day: 01/17 0701 - 01/18 0700 In: 860 [P.O.:710; IV Piggyback:150] Out: 1580 [Urine:1580] Intake/Output this shift:    Lungs clear  Lab Results:  Recent Labs  01/15/15 0353 01/16/15 0309  WBC 8.7 7.4  HGB 7.7* 7.8*  HCT 22.2* 22.4*  PLT 129* 163   BMET:  Recent Labs  01/15/15 0353 01/16/15 0309  NA 131* 134*  K 3.7 3.7  CL 95* 102  CO2 30 29  GLUCOSE 116* 125*  BUN 11 7  CREATININE 0.76 0.76  CALCIUM 7.8* 8.1*    PT/INR: No results for input(s): LABPROT, INR in the last 72 hours. ABG    Component Value Date/Time   PHART 7.352 01/12/2015 1856   HCO3 22.0 01/12/2015 1856   TCO2 22 01/13/2015 1620   ACIDBASEDEF 3.0* 01/12/2015 1856   O2SAT 98.0 01/12/2015 1856   CBG (last 3)   Recent Labs  01/14/15 1154 01/14/15 1605 01/14/15 2140  GLUCAP 152* 142* 140*    Assessment/Plan: S/P Procedure(s) (LRB): AORTIC VALVE REPLACEMENT (AVR) (N/A) ASCENDING AORTIC ROOT REPLACEMENT (N/A) TRANSESOPHAGEAL ECHOCARDIOGRAM (TEE) (N/A) Postop anemia on po Fe Postop heart block EP consult and tx stepdown   LOS: 4 days    VAN TRIGT III,PETER 01/16/2015

## 2015-01-16 NOTE — Consult Note (Signed)
CARDIOLOGY CONSULT NOTE   Patient ID: Carol Warren MRN: [id number] DOB/AGE: 1946/12/14 69 y.o.  Admit date: 01/12/2015  Primary Physician   PROCHNAU,CAROLINE, MD Primary Cardiologist   Dr. Percival Spanish Reason for Consultation   Heart block s/p Bentall procedure. Pacemaker dependant.  HPI: Carol Warren is a 69 y.o. 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 This has been complicated by post post op heart block and dependant on temporary pacemaker. EP consulted for PPM.   The patient is a 69 year old smoker with a history of possible bicuspid aortic valve disease with AS/AI. Her most recent 2D echo on 09/19/2014 showed a moderately thickened bicuspid aortic valve with moderate stenosis and moderate regurgitation with a mean gradient of 24 mm Hg. The LVEF was reduced to 30-35% compared to 07/07/2013 when the EF was 55-65% with mild to moderate stenosis and a mean gradient of 22 mm Hg with moderate AI. The ascending aorta has been dilated with a root diameter recorded as 36 mm in 2014 and 38 mm now. She underwent cardiac cath on 09/23/2014 which showed a PAP of 59/28 with a wedge of 36 and an AVA of 0.84 cm2 with a mean gradient measured at only 12.7 mm Hg. LVEDP was 32. There was no significant coronary disease. Since she had a low gradient considering the appearance of her valve and cardiomyopathy, a dobutamine stress echo was done that showed low gradient severe AS with a mean gradient at rest of 33 increasing to 46 at 10 mcg and 52 at 20 mcg. She had a CTA of the chest in July 2015 that showed a maximum diameter of the sinus of Valsalva of 4.7 cm, STJ of 3.5 cm, and ascending aorta of 4.0 cm. She has long-standing dyspnea and is followed by Dr. Melvyn Novas. She continues to smoke 3/4 pack of  cigarettes per day.    She underwent successful Bentall surgery on 01/12/15 but this has been complicated by heart block. She has been pacemaker dependant, but otherwise recovering well. When the pacemaker is turned off she returns to HR in the 30s-40s. Otherwise she is feeling well with no CP or SOB. No lightheadedness or dizziness. No LE edema, orthopnea or PND.    Past Medical History  Diagnosis Date  . 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 Osteopenia   . HSV-1 (herpes simplex virus 1) infection   . HSV-2 (herpes simplex virus 2) infection   . Bicuspid aortic valve     CONGENITAL  . Aortic aneurysm     CARDIOLOGIST IS DR. Bettina Gavia [place]  . Adrenal tumor   . Degenerative disc disease     CERVICAL AND LUMBAR (DR. Lorin Mercy)  . Smoker   . Anxiety   . Chronic sinusitis   . Chronic depression   . Osteoarthritis   . Pernicious anemia   . Insomnia   . Asthma   . Pericardial cyst   . Chronic fatigue   . Vitamin B 12 deficiency   . Plantar fasciitis   . Hammer toe of right foot   . Hepatomegaly   . Rosacea   . Esophagitis   . Gastritis   . PONV (postoperative nausea and vomiting)   . Shortness of breath dyspnea   .  COPD (chronic obstructive pulmonary disease)   . GERD (gastroesophageal reflux disease)   . Carpal tunnel syndrome, bilateral   . Cancer     skin     Past Surgical History  Procedure Laterality Date  . Excision of vulvar cis    . Sinus procedure  2009    x 6  . Excision of basal cell ca      SKIN   . Cataract extraction Bilateral     X2  . Carpal tunnel release Bilateral 2008  . Bunionectomy with hammertoe reconstruction Right   . Colonoscopy    . Upper gastrointestinal endoscopy    . Tee without cardioversion N/A 10/06/2014    Procedure: TRANSESOPHAGEAL ECHOCARDIOGRAM (TEE);  Surgeon: Sueanne Margarita, MD;  Location: [facility] ENDOSCOPY;  Service: Cardiovascular;  Laterality: N/A;  . Left and right heart catheterization with coronary angiogram N/A  09/23/2014    Procedure: LEFT AND RIGHT HEART CATHETERIZATION WITH CORONARY ANGIOGRAM;  Surgeon: Minus Breeding, MD;  Location: [facility] CATH LAB;  Service: Cardiovascular;  Laterality: N/A;    Allergies  Allergen Reactions  . Oxycodone Nausea Only    I have reviewed the patient's current medications . acetaminophen  1,000 mg Oral 4 times per day   Or  . acetaminophen (TYLENOL) oral liquid 160 mg/5 mL  1,000 mg Per Tube 4 times per day  . aspirin EC  325 mg Oral Daily   Or  . aspirin  324 mg Per Tube Daily  . bisacodyl  10 mg Oral Daily   Or  . bisacodyl  10 mg Rectal Daily  . budesonide-formoterol  2 puff Inhalation BID  . buPROPion  300 mg Oral Daily  . docusate sodium  200 mg Oral Daily  . enoxaparin (LOVENOX) injection  40 mg Subcutaneous QHS  . ferrous [id number] C-folic acid  1 capsule Oral TID PC  . fluticasone  2 spray Each Nare QHS,MR X 1  . furosemide  20 mg Oral Daily  . montelukast  10 mg Oral QHS  . nicotine  21 mg Transdermal Daily  . pantoprazole  40 mg Oral Daily  . sodium chloride  3 mL Intravenous Q12H  . traZODone  50 mg Oral QHS   . sodium chloride 250 mL (01/13/15 0520)  . lactated ringers Stopped (01/14/15 1600)   dextromethorphan-guaiFENesin, diazepam, HYDROcodone-acetaminophen, levalbuterol, morphine injection, ondansetron (ZOFRAN) IV, sodium chloride, traMADol  Prior to Admission medications   Medication Sig Start Date End Date Taking? Authorizing Provider  albuterol (PROVENTIL HFA;VENTOLIN HFA) 108 (90 BASE) MCG/ACT inhaler Inhale 1-2 puffs into the lungs every 6 (six) hours as needed for wheezing or shortness of breath.   Yes Historical Provider, MD  AMBULATORY NON FORMULARY MEDICATION Topical nasal rinse/ Tobram Budesonide Use nasal as needed   Yes Historical Provider, MD  B Complex Vitamins (VITAMIN B COMPLEX PO) Take 1 tablet by mouth daily.   Yes Historical Provider, MD  bisacodyl (DULCOLAX) 5 MG EC tablet Take 5 mg by mouth daily as needed  for moderate constipation.   Yes Historical Provider, MD  buPROPion (WELLBUTRIN XL) 300 MG 24 hr tablet Take 300 mg by mouth daily.   Yes Historical Provider, MD  Calcium Carbonate-Vitamin D (CALCIUM-D PO) Take 1 tablet by mouth daily.    Yes Historical Provider, MD  cyanocobalamin (,VITAMIN B-12,) 1000 MCG/ML injection Inject 1,000 mcg into the muscle once a week.   Yes Historical Provider, MD  diazepam (VALIUM) 10 MG tablet Take 10 mg by mouth 3 (three)  times daily as needed for anxiety.   Yes Historical Provider, MD  fluticasone (FLONASE) 50 MCG/ACT nasal spray Place 1 spray into the nose 2 (two) times daily.   Yes Historical Provider, MD  furosemide (LASIX) 20 MG tablet Take 1 tablet (20 mg total) by mouth daily. 10/03/14  Yes Burtis Junes, NP  HYDROcodone-acetaminophen (NORCO/VICODIN) 5-325 MG per tablet Take 0.5 tablets by mouth every 4 (four) hours as needed for moderate pain.    Yes Historical Provider, MD  loratadine (CLARITIN) 10 MG tablet Take 10 mg by mouth daily.   Yes Historical Provider, MD  losartan (COZAAR) 50 MG tablet Take 1 tablet (50 mg total) by mouth daily. 09/23/14  Yes Dayna N Dunn, PA-C  meloxicam (MOBIC) 15 MG tablet Take 15 mg by mouth daily as needed for pain.   Yes Historical Provider, MD  Menthol, Topical Analgesic, (ICY HOT EX) Apply 1 patch topically daily.   Yes Historical Provider, MD  montelukast (SINGULAIR) 10 MG tablet Take 10 mg by mouth at bedtime.   Yes Historical Provider, MD  Multiple Vitamin (MULTIVITAMIN) capsule Take 1 capsule by mouth daily.     Yes Historical Provider, MD  Olopatadine HCl (PATANASE) 0.6 % SOLN Place 2 drops into both nostrils 2 (two) times daily.    Yes Historical Provider, MD  omeprazole (PRILOSEC) 20 MG capsule Take 40 mg by mouth daily.   Yes Historical Provider, MD  polyethylene glycol (MIRALAX / GLYCOLAX) packet Take 17 g by mouth daily as needed.   Yes Historical Provider, MD  Polyvinyl Alcohol-Povidone (REFRESH OP) Place 2  drops into both eyes 2 (two) times daily.   Yes Historical Provider, MD  Probiotic Product (PROBIOTIC DAILY PO) Take 1 capsule by mouth daily. Ultimate flora   Yes Historical Provider, MD  pseudoephedrine-guaifenesin (MUCINEX D) 60-600 MG per tablet Take 1 tablet by mouth as needed for congestion.   Yes Historical Provider, MD  ranitidine (ZANTAC) 300 MG capsule Take 300 mg by mouth every evening.   Yes Historical Provider, MD  Saline (SIMPLY SALINE) 0.9 % AERS Place 2 sprays into both nostrils 2 (two) times daily.   Yes Historical Provider, MD  traZODone (DESYREL) 50 MG tablet Take 50-75 mg by mouth at bedtime.    Yes Historical Provider, MD  albuterol (PROVENTIL) (2.5 MG/3ML) 0.083% nebulizer solution Take 2.5 mg by nebulization 2 (two) times daily as needed for wheezing.    Historical Provider, MD     History   Social History  . Marital Status: Married    Spouse Name: N/A    Number of Children: 2  . Years of Education: N/A   Occupational History  . retired    Social History Main Topics  . Smoking status: Current Every Day Smoker -- 1.00 packs/day for 50 years    Types: Cigarettes  . Smokeless tobacco: Never Used     Comment: uses vapor cig  . Alcohol Use: No  . Drug Use: No  . Sexual Activity: Not on file   Other Topics Concern  . Not on file   Social History Narrative    No family status information on file.   Family History  Problem Relation Age of Onset  . Pancreatic cancer Mother 59  . Diabetes Father   . Hypertension Father   . Heart disease Father 58    CAD  . Breast cancer Sister   . Diabetes Maternal Grandmother   . Hypertension Maternal Grandmother   . Cancer Maternal Grandfather  colon or stomach  . Hypertension Paternal Grandmother   . Heart disease Paternal Grandmother     Later onset  . Asthma Grandchild   . Diverticulosis Paternal Grandmother      ROS:  Full 14 point review of systems complete and found to be negative unless listed  above.  Physical Exam: Blood pressure 92/43, pulse 102, temperature 98.9 F (37.2 C), temperature source Oral, resp. rate 25, height 5\' 1"  (1.549 m), weight 111 lb 1.8 oz (50.4 kg), SpO2 98 %.  General: Well developed, well nourished, female in no acute distress Head: Eyes PERRLA, No xanthomas.   Normocephalic and atraumatic, oropharynx without edema or exudate. Lungs: CTAB Heart: HRRR S1 S2, no rub/gallop, Heart irregular rate and rhythm with S1, S2  murmur. pulses are 2+ extrem.   Neck: No carotid bruits. No lymphadenopathy.  No JVD. Abdomen: Bowel sounds present, abdomen soft and non-tender without masses or hernias noted. Msk:  No spine or cva tenderness. No weakness, no joint deformities or effusions. Extremities: No clubbing or cyanosis. no  edema.  Neuro: Alert and oriented X 3. No focal deficits noted. Psych:  Good affect, responds appropriately Skin: No rashes or lesions noted.  Labs:   Lab Results  Component Value Date   WBC 7.4 01/16/2015   HGB 7.8* 01/16/2015   HCT 22.4* 01/16/2015   MCV 90.3 01/16/2015   PLT 163 01/16/2015   No results for input(s): INR in the last 72 hours.  Recent Labs Lab 01/11/15 0906  01/16/15 0309  NA 130*  < > 134*  K 4.1  < > 3.7  CL 98  < > 102  CO2 20  < > 29  BUN 14  < > 7  CREATININE 0.96  < > 0.76  CALCIUM 9.0  < > 8.1*  PROT 6.8  --   --   BILITOT 0.6  --   --   ALKPHOS 43  --   --   ALT 21  --   --   AST 20  --   --   GLUCOSE 99  < > 125*  ALBUMIN 4.1  --   --   < > = values in this interval not displayed. MAGNESIUM  Date Value Ref Range Status  01/13/2015 2.2 1.5 - 2.5 mg/dL Final    PRO B NATRIURETIC PEPTIDE (BNP)  Date/Time Value Ref Range Status  10/03/2014 11:00 AM 1261.0* 0.0 - 100.0 pg/mL Final    Echo: Study Date: 09/19/2014 LV EF: 30% -  35% Study Conclusions - Left ventricle: The cavity size was normal. Wall thickness wasnormal. Systolic function was moderately to severely reduced. Theestimated  ejection fraction was in the range of 30% to 35%. Thereis akinesis of the anteroseptal myocardium. Features areconsistent with a pseudonormal left ventricular filling pattern, with concomitant abnormal relaxation and increased filling pressure (grade 2 diastolic dysfunction). - Aortic valve: Bicuspid; moderately thickened, moderately calcified leaflets. Cusp separation was reduced. There was moderate stenosis. There was moderate regurgitation. Peak velocity (S): 302 cm/s. Mean gradient (S): 24 mm Hg. Aorticstenosis may be underestimated as a result of decreased ejection  fraction. - Aorta: The aorta was moderately calcified. Aortic root dimension: 38 mm (ED). - Ascending aorta: The ascending aorta was mildly dilated. - Mitral valve: Calcified annulus. Mildly thickened leaflets . There was mild to moderate regurgitation. - Pulmonary arteries: Systolic pressure was mildly increased. PA peak pressure: 44 mm Hg (S).  ECG:  2nd degree A-V block with 3:1 A-V conduction Left  axis deviation Right bundle branch block  Radiology:  Dg Chest Port 1 View  01/16/2015   CLINICAL DATA:  Aortic valve replacement.  EXAM: PORTABLE CHEST - 1 VIEW  COMPARISON:  01/15/2015.  FINDINGS: Prior areas valve replacement. Cardiomegaly with normal pulmonary vascularity. Left lower lobe atelectatic changes are present. Small left pleural effusion. No pneumothorax.  IMPRESSION: 1. Aortic valve replacement. Cardiomegaly. Normal pulmonary vascularity. 2. Left lower lobe atelectatic changes with small left pleural effusion.   Electronically Signed   By: Marcello Moores  Register   On: 01/16/2015 07:30   Dg Chest Port 1 View  01/15/2015   CLINICAL DATA:  Subsequent encounter for shortness of breath and cough.  EXAM: PORTABLE CHEST - 1 VIEW  COMPARISON:  1 day prior  FINDINGS: Right IJ Cordis sheath remains in place. Prior median sternotomy. Cardiomegaly accentuated by AP portable technique. Improved to resolved small left pleural  effusion. Similar small left pleural effusion. No pneumothorax. Resolved interstitial edema. Improved left greater than right bibasilar airspace disease.  IMPRESSION: Improved aeration with improved to resolved congestive heart failure and right pleural effusion.  Small left pleural effusion with adjacent atelectasis remains.   Electronically Signed   By: Abigail Miyamoto M.D.   On: 01/15/2015 08:22    ASSESSMENT AND PLAN:    Active Problems:   S/P AVR  Bonita A Bodine is a 69 y.o. 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 This has been complicated by post post op heart block and dependant on temporary pacemaker. EP consulted for PPM.   Heart block- pacemaker dependant -- She will likely require permanent pacemaker placement. Dr. Caryl Comes to see. WIll make  NPO for possible procedure this afternoon.   Severe aortic stenosis/ acending aortic aneurysm- s/p successful Bentall procedure 01/12/15  Non-ischemic CM- consider adding BB and ACE when BP can tolerate.  -- Appears evolemic  COPD- stable  Tobacco abuse- nicotine patch   Post op anemia- H/H 7.8/22.4. Continue to monitor   Signed: Crista Luria 01/16/2015 9:16 AM  Pager [phone]  Co-Sign MD  69 year old woman with a history of a bicuspid valve and ascending aortic aneurysm underwent Bentall procedure 1/14. She has modest nonischemic cardiomyopathy with an ejection fraction of 35-40%. She developed postoperative heart block initially complete. This morning she had 3-1 conduction; this afternoon she has had some intermittent 2-1 conduction and junctional rates in the 50s.  Examination is notable for pulse of about 95, blood pressure 92/43 and respirations of 18. Lungs were clear heart sounds were regular there is a 2/6  systolic murmur extremity is without edema.  Interrogation of her pacemaker was as noted above.  Impression  1-high-grade heart block with 3:1, 2:1 as well as some complete heart block today  2-nonischemic cardiomyopathy with EF of 35-40%  3- status post aortic valve/Bentall procedure  The patient has high-grade heart block following aortic valve surgery. She did not have left bundle branch block preoperatively; this leads me more sanguine that she will have recovery of conduction as does evidence of conduction is evident today. Recommendations are to wait 5-7 days.  In the event that conduction does not recover, she will need pacing and probably CRT given her degree of LV dysfunction and the impact of RV apical pacing. It would also be appropriate to consider  ICD at that time.  Hopefully however we will avoid all of this.

## 2015-01-17 ENCOUNTER — Inpatient Hospital Stay (HOSPITAL_COMMUNITY): Payer: Medicare Other

## 2015-01-17 ENCOUNTER — Encounter (HOSPITAL_COMMUNITY): Payer: Self-pay | Admitting: Surgery

## 2015-01-17 LAB — BASIC METABOLIC PANEL
Anion gap: 8 (ref 5–15)
BUN: 9 mg/dL (ref 6–23)
CO2: 28 mmol/L (ref 19–32)
Calcium: 8.3 mg/dL — ABNORMAL LOW (ref 8.4–10.5)
Chloride: 104 mEq/L (ref 96–112)
Creatinine, Ser: 0.77 mg/dL (ref 0.50–1.10)
GFR calc Af Amer: >90 mL/min (ref >90)
GFR calc non Af Amer: 85 mL/min — ABNORMAL LOW (ref >90)
Glucose, Bld: 130 mg/dL — ABNORMAL HIGH (ref 70–99)
Potassium: 3.7 mmol/L (ref 3.5–5.1)
Sodium: 140 mmol/L (ref 135–145)

## 2015-01-17 LAB — CBC
HCT: 22.1 % — ABNORMAL LOW (ref 36.0–46.0)
Hemoglobin: 7.5 g/dL — ABNORMAL LOW (ref 12.0–15.0)
MCH: 31.3 pg (ref 26.0–34.0)
MCHC: 33.9 g/dL (ref 30.0–36.0)
MCV: 92.1 fL (ref 78.0–100.0)
Platelets: 202 10*3/uL (ref 150–400)
RBC: 2.4 MIL/uL — ABNORMAL LOW (ref 3.87–5.11)
RDW: 17.2 % — ABNORMAL HIGH (ref 11.5–15.5)
WBC: 6 10*3/uL (ref 4.0–10.5)

## 2015-01-17 MED ORDER — POTASSIUM CHLORIDE CRYS ER 20 MEQ PO TBCR
20.0000 meq | EXTENDED_RELEASE_TABLET | Freq: Every day | ORAL | Status: DC
  Administered 2015-01-17 – 2015-01-19 (×3): 20 meq via ORAL
  Filled 2015-01-17 (×4): qty 1

## 2015-01-17 MED ORDER — POTASSIUM CHLORIDE CRYS ER 20 MEQ PO TBCR
30.0000 meq | EXTENDED_RELEASE_TABLET | Freq: Once | ORAL | Status: AC
  Administered 2015-01-17: 30 meq via ORAL
  Filled 2015-01-17: qty 1

## 2015-01-17 MED ORDER — FUROSEMIDE 40 MG PO TABS
40.0000 mg | ORAL_TABLET | Freq: Every day | ORAL | Status: DC
  Administered 2015-01-17 – 2015-01-19 (×3): 40 mg via ORAL
  Filled 2015-01-17 (×4): qty 1

## 2015-01-17 MED ORDER — WHITE PETROLATUM GEL
Status: DC | PRN
  Filled 2015-01-17: qty 28.35

## 2015-01-17 MED ORDER — LACTULOSE 10 GM/15ML PO SOLN
20.0000 g | Freq: Once | ORAL | Status: AC
  Administered 2015-01-17: 20 g via ORAL
  Filled 2015-01-17: qty 30

## 2015-01-17 NOTE — Progress Notes (Signed)
Pt ambulated 500 feet with RN; pt back to room to bed; call bell w/i reach; will cont. To monitor.

## 2015-01-17 NOTE — Progress Notes (Addendum)
      [address]       [place] [address]             [phone]        5 Days Post-Op Procedure(s) (LRB): AORTIC VALVE REPLACEMENT (AVR) (N/A) ASCENDING AORTIC ROOT REPLACEMENT (N/A) TRANSESOPHAGEAL ECHOCARDIOGRAM (TEE) (N/A)  Subjective: Patient with complaints of fatigue. Passing flatus but no bowel movement yet.  Objective: Vital signs in last 24 hours: Temp:  [97.5 F (36.4 C)-98.8 F (37.1 C)] 98.4 F (36.9 C) (01/19 0448) Pulse Rate:  [94-109] 101 (01/19 0448) Cardiac Rhythm:  [-] A-V Sequential paced (01/19 0745) Resp:  [20-26] 24 (01/19 0448) BP: (92-132)/(43-80) 132/79 mmHg (01/19 0448) SpO2:  [90 %-98 %] 90 % (01/19 0448) Weight:  [110 lb 14.3 oz (50.3 kg)-115 lb 12.8 oz (52.527 kg)] 110 lb 14.3 oz (50.3 kg) (01/19 0448)  Pre op weight 49 kg Current Weight  01/17/15 110 lb 14.3 oz (50.3 kg)      Intake/Output from previous day: 01/18 0701 - 01/19 0700 In: 220 [P.O.:220] Out: 400 [Urine:400]   Physical Exam:  Cardiovascular: RRR, no murmurs, gallops, or rubs. Pulmonary: Slightly decreased at bases; no rales, wheezes, or rhonchi. Abdomen: Soft, non tender, bowel sounds present. Extremities: Trace bilateral lower extremity edema. Wounds: Clean and dry.  No erythema or signs of infection.  Lab Results: CBC: Recent Labs  01/16/15 0309 01/17/15 0414  WBC 7.4 6.0  HGB 7.8* 7.5*  HCT 22.4* 22.1*  PLT 163 202   BMET:  Recent Labs  01/16/15 0309 01/17/15 0414  NA 134* 140  K 3.7 3.7  CL 102 104  CO2 29 28  GLUCOSE 125* 130*  BUN 7 9  CREATININE 0.76 0.77  CALCIUM 8.1* 8.3*    PT/INR:  Lab Results  Component Value Date   INR 1.41 01/12/2015   INR 0.91 01/11/2015   INR 1.04 09/23/2014   ABG:  INR: Will add last result for INR, ABG once components are confirmed Will add last 4 CBG results once components are confirmed  Assessment/Plan:  1. CV - Paced this am. Previous labile BP but now improved. Will consider  restarting low dose Cozaar soon. 2.  Pulmonary - On room air. CXR appears to show small bilateral pleural effusions and atelectasis, no pneumothorax.Encourage incentive spirometer 3. Volume Overload - On Lasix 20 daily. Will increase to 40 daily. 4.  Acute blood loss anemia - H and H 7.5 and 22.1 this am. On Trinsicon. Will discuss with Dr. Cyndia Bent if should transfuse.  5. Supplement potassium 6. CBGs 152/142/140. Pre op HGA1C 6.2. Is likely pre diabetic. Will need follow up with medical doctor after discharge. 7. Lactulose for constipation  ZIMMERMAN,DONIELLE MPA-C 01/17/2015,7:51 AM   Chart reviewed, patient examined, agree with above. She still has some complete heart block today. DDD paced. Will continue pacing and observe tomorrow. EP consult noted. Her preop EF was 30-35% on preop dobutamine stress echo but looked a little better in the OR.   She still has small bilateral effusions on her CXR so I would continue diuretic.

## 2015-01-17 NOTE — Progress Notes (Signed)
Medicare Important Message given? YES  (If response is "NO", the following Medicare IM given date fields will be blank)  Date Medicare IM given: 01/17/15 Medicare IM given by:  Dahlia Client [employer]

## 2015-01-17 NOTE — Progress Notes (Signed)
CARDIAC REHAB PHASE I   PRE:  Rate/Rhythm: 101 Pacing  BP:  Supine:   Sitting: 98/50  Standing:    SaO2: 95 RA  MODE:  Ambulation: 550 ft   POST:  Rate/Rhythm: 98 Pacing  BP:  Supine:   Sitting: 114/60  Standing:    SaO2: 96 RA 1420-1500 Assisted X 1 to ambulate with hand held assist. Gait steady, pt able to walk 550 feet. She c/o of feeling tired walking, but did well. VS stable Pt to side of bed after walk with cal light in reach.  Rodney Langton RN 01/17/2015 2:58 PM

## 2015-01-17 NOTE — Progress Notes (Signed)
Pt up ambulating in hallway with son; will cont. To monitor.

## 2015-01-18 ENCOUNTER — Encounter (HOSPITAL_COMMUNITY)
Admission: RE | Disposition: A | Discharge status: Home / Self Care | Payer: Self-pay | Source: Ambulatory Visit | Attending: Surgery

## 2015-01-18 ENCOUNTER — Encounter (HOSPITAL_COMMUNITY): Payer: Self-pay | Admitting: Internal Medicine

## 2015-01-18 DIAGNOSIS — D649 Anemia, unspecified: Secondary | ICD-10-CM | POA: Diagnosis not present

## 2015-01-18 DIAGNOSIS — I5022 Chronic systolic (congestive) heart failure: Secondary | ICD-10-CM

## 2015-01-18 DIAGNOSIS — I429 Cardiomyopathy, unspecified: Secondary | ICD-10-CM

## 2015-01-18 DIAGNOSIS — I059 Rheumatic mitral valve disease, unspecified: Secondary | ICD-10-CM

## 2015-01-18 DIAGNOSIS — E538 Deficiency of other specified B group vitamins: Secondary | ICD-10-CM | POA: Diagnosis present

## 2015-01-18 DIAGNOSIS — I442 Atrioventricular block, complete: Secondary | ICD-10-CM | POA: Diagnosis not present

## 2015-01-18 HISTORY — PX: BI-VENTRICULAR PACEMAKER INSERTION: SHX5462

## 2015-01-18 SURGERY — BI-VENTRICULAR PACEMAKER INSERTION (CRT-P)

## 2015-01-18 MED ORDER — MIDAZOLAM HCL 5 MG/5ML IJ SOLN
INTRAMUSCULAR | Status: AC
  Filled 2015-01-18: qty 5

## 2015-01-18 MED ORDER — CHLORHEXIDINE GLUCONATE 4 % EX LIQD
60.0000 mL | Freq: Once | CUTANEOUS | Status: AC
  Administered 2015-01-18: 4 via TOPICAL
  Filled 2015-01-18: qty 60

## 2015-01-18 MED ORDER — CEFAZOLIN SODIUM 1-5 GM-% IV SOLN: 1.0000 g | Freq: Four times a day (QID) | INTRAVENOUS | Status: DC

## 2015-01-18 MED ORDER — CEFAZOLIN SODIUM-DEXTROSE 2-3 GM-% IV SOLR
2.0000 g | INTRAVENOUS | Status: DC
Start: 2015-01-18 — End: 2015-01-18
  Administered 2015-01-18: 2 g via INTRAVENOUS
  Filled 2015-01-18: qty 50

## 2015-01-18 MED ORDER — LIDOCAINE HCL (PF) 1 % IJ SOLN
INTRAMUSCULAR | Status: AC
  Filled 2015-01-18: qty 30

## 2015-01-18 MED ORDER — HEPARIN (PORCINE) IN NACL 2-0.9 UNIT/ML-% IJ SOLN
INTRAMUSCULAR | Status: AC
  Filled 2015-01-18: qty 500

## 2015-01-18 MED ORDER — FENTANYL CITRATE 0.05 MG/ML IJ SOLN
INTRAMUSCULAR | Status: AC
  Filled 2015-01-18: qty 2

## 2015-01-18 MED ORDER — SODIUM CHLORIDE 0.9 % IR SOLN
80.0000 mg | Status: DC
  Administered 2015-01-18: 80 mg
  Filled 2015-01-18: qty 2

## 2015-01-18 MED ORDER — CEFAZOLIN SODIUM 1-5 GM-% IV SOLN
1.0000 g | Freq: Four times a day (QID) | INTRAVENOUS | Status: AC
  Administered 2015-01-18 – 2015-01-19 (×3): 1 g via INTRAVENOUS
  Filled 2015-01-18 (×3): qty 50

## 2015-01-18 MED ORDER — SODIUM CHLORIDE 0.9 % IV SOLN
INTRAVENOUS | Status: DC
Start: 2015-01-18 — End: 2015-01-18

## 2015-01-18 MED ORDER — SODIUM CHLORIDE 0.9 % IV SOLN
INTRAVENOUS | Status: DC
  Administered 2015-01-18: 13:00:00 via INTRAVENOUS

## 2015-01-18 MED ORDER — ACETAMINOPHEN 325 MG PO TABS: 325.0000 mg | ORAL_TABLET | ORAL | Status: DC | PRN

## 2015-01-18 MED ORDER — ONDANSETRON HCL 4 MG/2ML IJ SOLN: 4.0000 mg | Freq: Four times a day (QID) | INTRAMUSCULAR | Status: DC | PRN

## 2015-01-18 NOTE — Progress Notes (Signed)
Echocardiogram 2D Echocardiogram has been performed.  Darlina Sicilian M 01/18/2015, 11:24 AM

## 2015-01-18 NOTE — CV Procedure (Signed)
SURGEON:  Cristopher Peru, MD      PREPROCEDURE DIAGNOSES:   1. Nonischemic cardiomyopathy.   2. New York Heart Association class III, heart failure chronically.   3. Left bundle-branch block, s/p AVR/Bentall with the development of CHB     POSTPROCEDURE DIAGNOSES:   1. Nonischemic cardiomyopathy.   2. New York Heart Association class III heart failure chronically.   3. Left bundle-branch block, s/p AVR/Bentall with the development of CHB     PROCEDURES:    1. Biventricular ICD implantation.  2. Venography of the coronary sinus     INTRODUCTION:  Carol Warren is a 69 y.o. female with a nonischemic CM (EF 30%), NYHA Class III CHF, and LBBB QRS morophology who developed CHB after undergoing AVR/Bentall. At this time, she meets MADIT II/ SCD-HeFT criteria for ICD implantation for primary prevention of sudden death.  Given CHB, the patient may also be expected to benefit from resynchronization therapy. The patient has been treated with an optimal medical regimen but continues to have a depressed ejection fraction and NYHA Class III CHF symptoms.  she therefore  presents today for a biventricular ICD implantation.      DESCRIPTION OF PROCEDURE:  Informed written consent was obtained and the   patient was brought to the electrophysiology lab in the fasting state. The patient was adequately sedated with intravenous Versed and Fentanyl as outlined in the nursing report.  The patient's left chest was prepped and draped in the usual sterile fashion by the EP lab staff.  The skin overlying the left deltopectoral region was infiltrated with lidocaine for local analgesia.  A 6-cm incision was made over the left deltopectoral region.  A left subcutaneous defibrillator pocket was fashioned using a combination of sharp and blunt dissection.  Electrocautery was used to assure hemostasis.       RA/RV Lead Placement: The left axillary vein was cannulated with fluoroscopic visualization.  No contrast was  required for this endeavor.  Through the left axillary vein, a Medtronic [id number] (serial # [id number]  ) right atrial lead and a Medtronic [id number] (serial number [id number]) right ventricular defibrillator lead were advanced with fluoroscopic visualization into the right atrial appendage and right ventricular apical septal positions respectively.  Initial atrial lead P-waves measured 2 mV with an impedance of 437 ohms and a threshold of 1 volts at 0.5 milliseconds.  The right ventricular lead R-wave measured 8 mV with impedance of 727 ohms and a threshold of 0.5 volts at 0.5 milliseconds.   LV Lead Placement:  A Medtronic guide was advanced through the left axillary vein into the low lateral right atrium. A 6 french hexapolar EP catheter was introduced through the Medtronic guide and used to cannulate the coronary sinus. A coronary sinus nonselective venogram was performed by hand injection of nonionic contrast. This demonstrated a moderated sized posterior vein.  A 0.014 angioplasty guide wire was introduced through the Medtronic Guide and advanced into the posterior vein. A Medtronic(serial number [id number]) lead was advanced through the Medtronic guide into the distal lateral branch. This was approximately two-thirds from the base to the apex in a very lateral  position. In this location with 4-coil bipolar configuration, the left ventricular lead R-waves measured 4 mV with impedance of 724 ohms and a threshold of 1 volt at 0.5 Milliseconds with no diaphragmatic stimulation observed when pacing at 10 volts output. The medtronic guide was therefore removed.  All three leads were secured to the pectoralis fascia using #  2 silk suture over the suture sleeves. The pocket then irrigated with copious gentamicin solution.   Device Placement: The leads were then  connected to a Medtronic (serial  Number [id number]) biventricular ICD.  The defibrillator was placed into the  pocket.  The pocket was then closed in 2 layers with  2.0 Vicryl suture  for the subcutaneous and subcuticular layers.  Steri-Strips and a  sterile dressing were then applied.   DFT Testing: DFT testing was not carried out as the patient's IV access was not optimal and she could not be sedated.     CONCLUSIONS:   1. Nonischemic cardiomyopathy with Left bundle-branch block and chronic New York Heart Association class III heart failure, s/p AVR/Bentall with the development of CHB.   2. Successful biventricular ICD implantation.   3. No early apparent complications.   Cristopher Peru, MD  3:12 PM 01/18/2015

## 2015-01-18 NOTE — Interval H&P Note (Signed)
History and Physical Interval Note: the patient has been found to have an EF of 30% post op and will undergo insertion of a BiV ICD as she is at risk for sudden death with a non-ischemic cardiomyopathy.   01/18/2015 12:29 PM  Carol Warren  has presented today for surgery, with the diagnosis of syncope  The various methods of treatment have been discussed with the patient and family. After consideration of risks, benefits and other options for treatment, the patient has consented to BiV ICD implantation as a surgical intervention .  The patient's history has been reviewed, patient examined, no change in status, stable for surgery.  I have reviewed the patient's chart and labs.  Questions were answered to the patient's satisfaction.     Cristopher Peru

## 2015-01-18 NOTE — H&P (Signed)
  ICD Criteria  Current LVEF:30% ;Obtained < 1 month ago.  NYHA Functional Classification: Class III  Heart Failure History:  Yes, Duration of heart failure since onset is > 9 months  Non-Ischemic Dilated Cardiomyopathy History:  Yes, timeframe is > 9 months  Atrial Fibrillation/Atrial Flutter:  No.  Ventricular Tachycardia History:  No.  Cardiac Arrest History:  No  History of Syndromes with Risk of Sudden Death:  No.  Previous ICD:  No.  Electrophysiology Study: No.  Prior MI: No.  PPM: No.  OSA:  No  Patient Life Expectancy of >=1 year: Yes.  Anticoagulation Therapy:  Patient is on anticoagulation therapy, anticoagulation was NOT held prior to procedure.   Beta Blocker Therapy:  No, Reason not on Beta Blocker therapy: complete heart block  Ace Inhibitor/ARB Therapy:  No, Reason not on Ace Inhibitor/ARB therapy:  hypotension

## 2015-01-18 NOTE — Interval H&P Note (Signed)
History and Physical Interval Note:  01/18/2015 12:28 PM  Carol Warren  has presented today for surgery, with the diagnosis of syncope  The various methods of treatment have been discussed with the patient and family. After consideration of risks, benefits and other options for treatment, the patient has consented to biV ICD implantation as a surgical intervention .  The patient's history has been reviewed, patient examined, no change in status, stable for surgery.  I have reviewed the patient's chart and labs.  Questions were answered to the patient's satisfaction.     Cristopher Peru, M.D.

## 2015-01-18 NOTE — Progress Notes (Signed)
    Subjective:  Awake and alert  Objective:  Vital Signs in the last 24 hours: Temp:  [97.9 F (36.6 C)-98.3 F (36.8 C)] 98.1 F (36.7 C) (01/20 0430) Pulse Rate:  [98-102] 98 (01/20 0430) Resp:  [18-20] 18 (01/20 0430) BP: (98-134)/(50-76) 134/76 mmHg (01/20 0430) SpO2:  [95 %-98 %] 98 % (01/20 0430) Weight:  [110 lb 3.7 oz (50 kg)] 110 lb 3.7 oz (50 kg) (01/20 0430)  Intake/Output from previous day:  Intake/Output Summary (Last 24 hours) at 01/18/15 0922 Last data filed at 01/17/15 1746  Gross per 24 hour  Intake    740 ml  Output      0 ml  Net    740 ml    Physical Exam: General appearance: alert, cooperative, no distress and pale Lungs: decreased breath sounds Heart: regular rate and rhythm   Rate: 70  Rhythm: Paced with temporary pacemaker-underlying CHB  Lab Results:  Recent Labs  01/16/15 0309 01/17/15 0414  WBC 7.4 6.0  HGB 7.8* 7.5*  PLT 163 202    Recent Labs  01/16/15 0309 01/17/15 0414  NA 134* 140  K 3.7 3.7  CL 102 104  CO2 29 28  GLUCOSE 125* 130*  BUN 7 9  CREATININE 0.76 0.77   No results for input(s): TROPONINI in the last 72 hours.  Invalid input(s): CK, MB No results for input(s): INR in the last 72 hours.  Imaging: Imaging results have been reviewed   Assessment/Plan:   Principal Problem:   Severe aortic stenosis Active Problems:   S/P AVR -Edwards Magna-Ease pericardial valve and bentall proccedure 01/12/15   CHB (complete heart block)- post op   Bicuspid aortic valve   COPD GOLD 0/ still smoking    Anemia, secondary to surgery   B12 deficiency   PLAN: Seen by Dr Lovena Le earlier- possible pacemaker late today depending on schedule- NPO after 10 am.   Kerin Ransom PA-C Beeper [phone] 01/18/2015, 9:22 AM   EP Attending  Patient seen and examined. I have reviewed findings with Dr. Caryl Comes and Chanetta Marshall, N.P. The patient has persistent complete heart block after AVR/Bentall. She was still pacing at 30/min when  I temporarily made her VVI 30/min this morning. A BiV device is indicated. Question is post op EF. If her EF is less than 35%, BiV ICD is indicated and if 35% or above, BiV PPM. She has an echo pending. Will hopefully by able to get her device done today.   Mikle Bosworth.D.

## 2015-01-18 NOTE — H&P (View-Only) (Signed)
    Subjective:  Awake and alert  Objective:  Vital Signs in the last 24 hours: Temp:  [97.9 F (36.6 C)-98.3 F (36.8 C)] 98.1 F (36.7 C) (01/20 0430) Pulse Rate:  [98-102] 98 (01/20 0430) Resp:  [18-20] 18 (01/20 0430) BP: (98-134)/(50-76) 134/76 mmHg (01/20 0430) SpO2:  [95 %-98 %] 98 % (01/20 0430) Weight:  [110 lb 3.7 oz (50 kg)] 110 lb 3.7 oz (50 kg) (01/20 0430)  Intake/Output from previous day:  Intake/Output Summary (Last 24 hours) at 01/18/15 0922 Last data filed at 01/17/15 1746  Gross per 24 hour  Intake    740 ml  Output      0 ml  Net    740 ml    Physical Exam: General appearance: alert, cooperative, no distress and pale Lungs: decreased breath sounds Heart: regular rate and rhythm   Rate: 70  Rhythm: Paced with temporary pacemaker-underlying CHB  Lab Results:  Recent Labs  01/16/15 0309 01/17/15 0414  WBC 7.4 6.0  HGB 7.8* 7.5*  PLT 163 202    Recent Labs  01/16/15 0309 01/17/15 0414  NA 134* 140  K 3.7 3.7  CL 102 104  CO2 29 28  GLUCOSE 125* 130*  BUN 7 9  CREATININE 0.76 0.77   No results for input(s): TROPONINI in the last 72 hours.  Invalid input(s): CK, MB No results for input(s): INR in the last 72 hours.  Imaging: Imaging results have been reviewed   Assessment/Plan:   Principal Problem:   Severe aortic stenosis Active Problems:   S/P AVR -Edwards Magna-Ease pericardial valve and bentall proccedure 01/12/15   CHB (complete heart block)- post op   Bicuspid aortic valve   COPD GOLD 0/ still smoking    Anemia, secondary to surgery   B12 deficiency   PLAN: Seen by Dr Lovena Le earlier- possible pacemaker late today depending on schedule- NPO after 10 am.   Kerin Ransom PA-C Beeper [phone] 01/18/2015, 9:22 AM   EP Attending  Patient seen and examined. I have reviewed findings with Dr. Caryl Comes and Chanetta Marshall, N.P. The patient has persistent complete heart block after AVR/Bentall. She was still pacing at 30/min when  I temporarily made her VVI 30/min this morning. A BiV device is indicated. Question is post op EF. If her EF is less than 35%, BiV ICD is indicated and if 35% or above, BiV PPM. She has an echo pending. Will hopefully by able to get her device done today.   Mikle Bosworth.D.

## 2015-01-18 NOTE — Progress Notes (Signed)
Patient ambulated in the hallways 450 feet. Patient tolerated well and returned to bed to rest. Will continue to monitor.

## 2015-01-18 NOTE — Progress Notes (Signed)
6 Days Post-Op Procedure(s) (LRB): AORTIC VALVE REPLACEMENT (AVR) (N/A) ASCENDING AORTIC ROOT REPLACEMENT (N/A) TRANSESOPHAGEAL ECHOCARDIOGRAM (TEE) (N/A) Subjective:  No complaints  Objective: Vital signs in last 24 hours: Temp:  [97.9 F (36.6 C)-98.3 F (36.8 C)] 98.1 F (36.7 C) (01/20 0430) Pulse Rate:  [98-102] 98 (01/20 0430) Cardiac Rhythm:  [-] A-V Sequential paced;Ventricular paced (01/20 0807) Resp:  [18-20] 18 (01/20 0430) BP: (98-134)/(50-76) 134/76 mmHg (01/20 0430) SpO2:  [95 %-98 %] 98 % (01/20 0430) Weight:  [50 kg (110 lb 3.7 oz)] 50 kg (110 lb 3.7 oz) (01/20 0430)  Hemodynamic parameters for last 24 hours:    Intake/Output from previous day: 01/19 0701 - 01/20 0700 In: 1100 [P.O.:1100] Out: -  Intake/Output this shift:    General appearance: alert and cooperative Neurologic: intact Heart: regular rate and rhythm Lungs: diminished breath sounds bibasilar Extremities: extremities normal, atraumatic, no cyanosis or edema Wound: incision ok  Lab Results:  Recent Labs  01/16/15 0309 01/17/15 0414  WBC 7.4 6.0  HGB 7.8* 7.5*  HCT 22.4* 22.1*  PLT 163 202   BMET:  Recent Labs  01/16/15 0309 01/17/15 0414  NA 134* 140  Warren 3.7 3.7  CL 102 104  CO2 29 28  GLUCOSE 125* 130*  BUN 7 9  CREATININE 0.76 0.77  CALCIUM 8.1* 8.3*    PT/INR: No results for input(s): LABPROT, INR in the last 72 hours. ABG    Component Value Date/Time   PHART 7.352 01/12/2015 1856   HCO3 22.0 01/12/2015 1856   TCO2 22 01/13/2015 1620   ACIDBASEDEF 3.0* 01/12/2015 1856   O2SAT 98.0 01/12/2015 1856   CBG (last 3)  No results for input(s): GLUCAP in the last 72 hours.  Assessment/Plan: S/P Procedure(s) (LRB): AORTIC VALVE REPLACEMENT (AVR) (N/A) ASCENDING AORTIC ROOT REPLACEMENT (N/A) TRANSESOPHAGEAL ECHOCARDIOGRAM (TEE) (N/A)  She still has complete heart block under the pacer with ventricular rate in the 30's. She is 6 days postop so I think it is unlikely  to resolve after Bentall procedure with a very calcified annulus. I think she will need a permanent pacer. EP is following. It may be worthwhile repeating an echo to decide if an BiVICD is indicated. She is a small woman and an ICD is probably going to protrude a lot so I would be sure that it is indicated.  Continue IS and ambulation   LOS: 6 days    Carol Warren 01/18/2015

## 2015-01-19 ENCOUNTER — Inpatient Hospital Stay (HOSPITAL_COMMUNITY): Payer: Medicare Other

## 2015-01-19 ENCOUNTER — Encounter: Payer: Self-pay | Admitting: *Deleted

## 2015-01-19 DIAGNOSIS — I428 Other cardiomyopathies: Secondary | ICD-10-CM | POA: Insufficient documentation

## 2015-01-19 NOTE — Discharge Summary (Signed)
Physician Discharge Summary       [facility].[address]       [place] [address]             [phone]    Patient ID: Carol Warren MRN: [id number] DOB/AGE: 68/05/1946 68 y.o.  Admit date: 01/12/2015 Discharge date: 01/20/2015  Admission Diagnoses: 1. Bicuspid aortic valve 2. Aortic root and ascending aortic aneurysm 3. Severe aortic stenosis 4. 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 History of vulvar CIS 7. History of pernicious anemia 8. History of depression 9. History of DJD (cervical and lumbar) 10. History of adrenal tumor  Discharge Diagnoses:  1. Bicuspid aortic valve 2. Aortic root and ascending aortic aneurysm 3. Severe aortic stenosis 4. 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 History of vulvar CIS 7. History of pernicious anemia 8. History of depression 9. History of DJD (cervical and lumbar) 10. History of adrenal tumor 11. CHB (complete heart block)- post op  Consults: EPS  Procedures: 1. Median Sternotomy 2. Extracorporeal circulation 3. Replacement of ascending aortic aneurysm under deep hypothermic circulatory arrest 4. Aortic valve and root replacement using a composite 26 mm Gelweave Valsalva graft and 23 mm Edwards Magna-Ease pericardial valve with reimplantation of the coronary arteries. ( Biological Bentall procedure) by Dr. Cyndia Bent on 01/12/2015.  1. Biventricular ICD implantation. 2. Venography of the coronary sinus by Dr. Lovena Le on 01/18/2015.  History of Presenting Illness: The patient is a 69 year old smoker with a history of possible bicuspid aortic valve disease with AS/AI. Her most recent 2D echo on 09/19/2014 showed a moderately thickened bicuspid aortic valve with moderate stenosis and moderate regurgitation with a mean gradient of 24 mm Hg. The LVEF was reduced to 30-35% compared to 07/07/2013 when the LVEF was 55-65% with mild to moderate stenosis and a mean gradient of 22 mm Hg with moderate  AI. The ascending aorta has been dilated with a root diameter recorded as 36 mm in 2014 and 38 mm now. She underwent cardiac cath on 09/23/2014 which showed a PAP of 59/28 with a wedge of 36 and an AVA of 0.84 cm2 with a mean gradient measured at only 12.7 mm Hg. LVEDP was 32. There was no significant coronary disease. Since she had a low gradient considering the appearance of her valve and cardiomyopathy, a dobutamine stress echo was done that showed low gradient severe AS with a mean gradient at rest of 33 increasing to 46 at 10 mcg and 52 at 20 mcg. She had a CTA of the chest in July 2015 that showed a maximum diameter of the sinus of Valsalva of 4.7 cm, STJ of 3.5 cm, and ascending aorta of 4.0 cm.  She has long-standing dyspnea and is followed by Dr. Melvyn Novas. She continues to smoke 3/4 pack of cigarettes per day. She does report exertional fatigue and says she gets very tired later in the day. 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   Dr. Cyndia Bent discussed the need for a Bentall procedure. Potential risks, benefits, and complications were discussed and the patient agreed to proceed with surgery. Pre operative carotid duplex US showed no significant carotied artery stenosis bilaterally. This was done on 01/12/2015.  Brief Hospital Course:  The patient was extubated the evening of surgery without difficulty. She remained  afebrile and hemodynamically stable. She was in third degree AV block post operatively. She remained DDD paced.Gordy Councilman, a line, chest tubes, and foley were removed early in the post operative course.She was volume over loaded and diuresed. She did have ABL anemia post op. She did not require a post operative transfusion. She was put on oral iron. Her last H and H was 7.5 and 22.1 . She was weaned off the insulin drip. The patient's HGA1C pre op was 6.2. She will need further  surveillance of this after discharge by her medical doctor. A consult was obtained with Dr. Caryl Comes because of complete heart block. She underwent placement of a biventricular ICD on 01/18/2015.The patient was felt surgically stable for transfer from the ICU to [facility] for further convalescence on 01/19/2015. She continues to progress with cardiac rehab. She was ambulating on room air. She has been tolerating a diet and has had a bowel movement. Epicardial pacing wires were removed 01/19/2015 after bi vent ICD was interrogated and was functioning properly.  Sutures will be removed prior to discharge. She is felt surgically stable for discharge.  Latest Vital Signs: Blood pressure 120/72, pulse 113, temperature 97.8 F (36.6 C), temperature source Oral, resp. rate 18, height 5\' 1"  (1.549 m), weight 106 lb 7.7 oz (48.3 kg), SpO2 98 %.  Physical Exam: Cardiovascular: RRR Pulmonary: Slightly decreased at bases; no rales, wheezes, or rhonchi. Abdomen: Soft, non tender, bowel sounds present. Extremities: Trace bilateral lower extremity edema. Wounds: Clean and dry. No erythema or signs of infection.  Discharge Condition:Stable  Recent laboratory studies:  Lab Results  Component Value Date   WBC 6.0 01/17/2015   HGB 7.5* 01/17/2015   HCT 22.1* 01/17/2015   MCV 92.1 01/17/2015   PLT 202 01/17/2015   Lab Results  Component Value Date   NA 140 01/17/2015   K 3.7 01/17/2015   CL 104 01/17/2015   CO2 28 01/17/2015   CREATININE 0.77 01/17/2015   GLUCOSE 130* 01/17/2015      Diagnostic Studies: Dg Chest 2 View  01/19/2015   CLINICAL DATA:  Sore left arm following defibrillator placement  EXAM: CHEST  2 VIEW  COMPARISON:  01/17/2015  FINDINGS: Postsurgical changes are again noted. A new defibrillator is seen on the left. No pneumothorax is noted. Blunting of the costophrenic angles is again noted bilaterally stable from the previous exam. No new focal infiltrate is seen. No acute bony abnormality is  noted.  IMPRESSION: No pneumothorax following defibrillator placement. The remainder of the exam is stable.   Electronically Signed   By: Inez Catalina M.D.   On: 01/19/2015 07:46    Discharge Medications:   Medication List    STOP taking these medications        furosemide 20 MG tablet  Commonly known as:  LASIX      TAKE these medications        albuterol 108 (90 BASE) MCG/ACT inhaler  Commonly known as:  PROVENTIL HFA;VENTOLIN HFA  Inhale 1-2 puffs into the lungs every 6 (six) hours as needed for wheezing or shortness of breath.     albuterol (2.5 MG/3ML) 0.083% nebulizer solution  Commonly known as:  PROVENTIL  Take 2.5 mg by nebulization 2 (two) times daily as needed for wheezing.     AMBULATORY NON FORMULARY MEDICATION  - Topical nasal rinse/ Tobram Budesonide  - Use nasal as needed     aspirin 325 MG EC tablet  Take 1 tablet (325 mg total) by  mouth daily.     bisacodyl 5 MG EC tablet  Commonly known as:  DULCOLAX  Take 5 mg by mouth daily as needed for moderate constipation.     buPROPion 300 MG 24 hr tablet  Commonly known as:  WELLBUTRIN XL  Take 300 mg by mouth daily.     CALCIUM-D PO  Take 1 tablet by mouth daily.     carvedilol 3.125 MG tablet  Commonly known as:  COREG  Take 1 tablet (3.125 mg total) by mouth 2 (two) times daily with a meal.     cyanocobalamin 1000 MCG/ML injection  Commonly known as:  (VITAMIN B-12)  Inject 1,000 mcg into the muscle once a week.     diazepam 10 MG tablet  Commonly known as:  VALIUM  Take 10 mg by mouth 3 (three) times daily as needed for anxiety.     ferrous [id number] C-folic acid capsule  Commonly known as:  TRINSICON / FOLTRIN  Take 1 capsule by mouth every morning. For one month then stop.     fluticasone 50 MCG/ACT nasal spray  Commonly known as:  FLONASE  Place 1 spray into the nose 2 (two) times daily.     HYDROcodone-acetaminophen 5-325 MG per tablet  Commonly known as:  NORCO/VICODIN    Take 0.5 tablets by mouth every 4 (four) hours as needed for moderate pain.     ICY HOT EX  Apply 1 patch topically daily.     loratadine 10 MG tablet  Commonly known as:  CLARITIN  Take 10 mg by mouth daily.     losartan 25 MG tablet  Commonly known as:  COZAAR  Take 1 tablet (25 mg total) by mouth daily.     meloxicam 15 MG tablet  Commonly known as:  MOBIC  Take 15 mg by mouth daily as needed for pain.     montelukast 10 MG tablet  Commonly known as:  SINGULAIR  Take 10 mg by mouth at bedtime.     multivitamin capsule  Take 1 capsule by mouth daily.  Start taking on:  02/21/2015     nicotine 21 mg/24hr patch  Commonly known as:  NICODERM CQ - dosed in mg/24 hours  Place 1 patch (21 mg total) onto the skin daily.     omeprazole 20 MG capsule  Commonly known as:  PRILOSEC  Take 40 mg by mouth daily.     PATANASE 0.6 % Soln  Generic drug:  Olopatadine HCl  Place 2 drops into both nostrils 2 (two) times daily.     polyethylene glycol packet  Commonly known as:  MIRALAX / GLYCOLAX  Take 17 g by mouth daily as needed.     PROBIOTIC DAILY PO  Take 1 capsule by mouth daily. Ultimate flora     pseudoephedrine-guaifenesin 60-600 MG per tablet  Commonly known as:  MUCINEX D  Take 1 tablet by mouth as needed for congestion.     ranitidine 300 MG capsule  Commonly known as:  ZANTAC  Take 300 mg by mouth every evening.     REFRESH OP  Place 2 drops into both eyes 2 (two) times daily.     SIMPLY SALINE 0.9 % Aers  Generic drug:  Saline  Place 2 sprays into both nostrils 2 (two) times daily.     traZODone 50 MG tablet  Commonly known as:  DESYREL  Take 50-75 mg by mouth at bedtime.     VITAMIN B COMPLEX PO  Take  1 tablet by mouth daily.       The patient has been discharged on:   1.Beta Blocker:  Yes [ x  ]                              No   [   ]                              If No, reason:  2.Ace Inhibitor/ARB: Yes [  x ]                                      No  [    ]                                     If No, reason:  3.Statin:   Yes [   ]                  No  [ x  ]                  If No, reason:No CAD  4.Shela Commons:  Yes  [ x  ]                  No   [   ]                  If No, reason:  Follow Up Appointments: Follow-up Information    Follow up with Cristopher Peru, MD.   Specialty:  Cardiology   Why:  Call for a follow up appointment for 2 weeks   Contact information:   [address] [place] [address] [phone]       Follow up with Gaye Pollack, MD On 03/01/2015.   Specialty:  Cardiothoracic Surgery   Why:  PA/LAT CXR to be taken (at [facility] which is in the same building as Dr. Vivi Martens office) on 03/01/2015 at 9:00 am;Appointment time with Dr. Cyndia Bent is at 10:00 am   Contact information:   [address] [place] [address] [phone]       Follow up with PROCHNAU,CAROLINE, MD.   Specialty:  Internal Medicine   Why:  Call for an appointment regarding further surveillance of HGA1C 6.2   Contact information:   [place] [place] [address] [phone]       Follow up with Minus Breeding, MD On 02/16/2015.   Specialty:  Cardiology   Why:  Appointment time is at 2:30 pm   Contact information:   [address] [place] [address] [phone]       Signed: Lars Pinks MPA-C 01/20/2015, 9:05 AM

## 2015-01-19 NOTE — Progress Notes (Signed)
01/19/2015 0800 During am assessment, pt. C/o suffering from a "vertigo" attack which she reports she has at home on occasion. Pt states she takes prn valium when she has such attacks. PRN med given to patient per request. Advised patient not to ambulate in room during this time due to current symptoms. Pt. Verbalized understanding. Will continue to closely monitor patient.  Zerek Litsey, Arville Lime

## 2015-01-19 NOTE — Discharge Instructions (Signed)
Activity: 1.May walk up steps                2.No lifting more than ten pounds for four weeks.                 3.No driving for four weeks.                4.Stop any activity that causes chest pain, shortness of breath, dizziness, sweating or excessive weakness.                5.Avoid straining.                6.Continue with your breathing exercises daily.  Diet: Diabetic diet and Low fat, Low salt diet  Wound Care: May shower.  Clean wounds with mild soap and water daily. Contact the office at [phone] if any problems arise.  Aortic Valve Replacement, Care After Refer to this sheet in the next few weeks. These instructions provide you with information on caring for yourself after your procedure. Your health care provider may also give you specific instructions. Your treatment has been planned according to current medical practices, but problems sometimes occur. Call your health care provider if you have any problems or questions after your procedure. HOME CARE INSTRUCTIONS   Take medicines only as directed by your health care provider.  If your health care provider has prescribed elastic stockings, wear them as directed.  Take frequent naps or rest often throughout the day.  Avoid lifting over 10 lbs (4.5 kg) or pushing or pulling things with your arms for 6-8 weeks or as directed by your health care provider.  Avoid driving or airplane travel for 4-6 weeks after surgery or as directed by your health care provider. If you are riding in a car for an extended period, stop every 1-2 hours to stretch your legs. Keep a record of your medicines and medical history with you when traveling.  Do not drive or operate heavy machinery while taking pain medicine. (narcotics).  Do not cross your legs.  Do not use any tobacco products including cigarettes, chewing tobacco, or electronic cigarettes. If you need help quitting, ask your health care provider.  Do not take baths, swim, or use a hot  tub until your health care provider approves. Take showers once your health care provider approves. Pat incisions dry. Do not rub incisions with a washcloth or towel.  Avoid climbing stairs and using the handrail to pull yourself up for the first 2-3 weeks after surgery.  Return to work as directed by your health care provider.  Drink enough fluid to keep your urine clear or pale yellow.  Do not strain to have a bowel movement. Eat high-fiber foods if you become constipated. You may also take a medicine to help you have a bowel movement (laxative) as directed by your health care provider.  Resume sexual activity as directed by your health care provider. Men should not use medicines for erectile dysfunction until their doctor says it isokay.  If you had a certain type of heart condition in the past, you may need to take antibiotic medicine before having dental work or surgery. Let your dentist and health care providers know if you had one or more of the following:  Previous endocarditis.  An artificial (prosthetic) heart valve.  Congenital heart disease. SEEK MEDICAL CARE IF:  You develop a skin rash.   You experience sudden changes in your weight.  You have a fever.  SEEK IMMEDIATE MEDICAL CARE IF:   You develop chest pain that is not coming from your incision.  You have drainage (pus), redness, swelling, or pain at your incision site.   You develop shortness of breath or have difficulty breathing.   You have increased bleeding from your incision site.   You develop light-headedness.  MAKE SURE YOU:   Understand these directions.  Will watch your condition.  Will get help right away if you are not doing well or get worse. Document Released: 07/04/2005 Document Revised: 05/02/2014 Document Reviewed: 09/29/2012 [facility] Patient Information 2015 [place], [place]. This information is not intended to replace advice given to you by your health care provider. Make sure you  discuss any questions you have with your health care provider.

## 2015-01-19 NOTE — Progress Notes (Signed)
Pt states she does not want to take Symbicort, pt asks that this med be d/c.

## 2015-01-19 NOTE — Progress Notes (Signed)
CARDIAC REHAB PHASE I   PRE:  Rate/Rhythm: 107 paced  BP:  Supine:   Sitting: 120/64  Standing:    SaO2: 97%RA  MODE:  Ambulation: 550 ft   POST:  Rate/Rhythm: 125 paced  BP:  Supine:   Sitting: 125/70  Standing:    SaO2: 100%RA 0950-1015 Pt walked 550 ft with steady gait holding occasionally to side rail with right hand. Was a little SOB at the end of walk. Received pain med and ready to rest in bed after walk. Tolerated well. Heart rate 125 after walk.   Graylon Good, RN BSN  01/19/2015 10:11 AM

## 2015-01-19 NOTE — Progress Notes (Signed)
01/19/2015 2:29 PM EPW d/c per order and per protocol. Ends intact. Pt. Tolerated well. Advised BR x 1 HR. Call bell within reach. VS collected per protocol. Will continue to monitor patient.  Robt Okuda, Arville Lime

## 2015-01-19 NOTE — Progress Notes (Signed)
Patient ID: Carol Warren, female   DOB: 02/06/1946, 69 y.o.   MRN: [id number]    Patient Name: Carol Warren Date of Encounter: 01/19/2015     Principal Problem:   Severe aortic stenosis Active Problems:   Bicuspid aortic valve   COPD GOLD 0/ still smoking    S/P AVR -Edwards Magna-Ease pericardial valve and bentall proccedure 01/12/15   Anemia, secondary to surgery   B12 deficiency   CHB (complete heart block)- post op    SUBJECTIVE  Incisional soreness, but no chest pain or sob.  CURRENT MEDS . aspirin EC  325 mg Oral Daily   Or  . aspirin  324 mg Per Tube Daily  . bisacodyl  10 mg Oral Daily   Or  . bisacodyl  10 mg Rectal Daily  . budesonide-formoterol  2 puff Inhalation BID  . buPROPion  300 mg Oral Daily  . docusate sodium  200 mg Oral Daily  . enoxaparin (LOVENOX) injection  40 mg Subcutaneous QHS  . ferrous [id number] C-folic acid  1 capsule Oral TID PC  . fluticasone  2 spray Each Nare QHS,MR X 1  . furosemide  40 mg Oral Daily  . montelukast  10 mg Oral QHS  . nicotine  21 mg Transdermal Daily  . pantoprazole  40 mg Oral Daily  . potassium chloride  20 mEq Oral Daily  . sodium chloride  3 mL Intravenous Q12H  . sodium chloride  3 mL Intravenous Q12H  . traZODone  50 mg Oral QHS    OBJECTIVE  Filed Vitals:   01/18/15 1600 01/18/15 1605 01/18/15 1916 01/19/15 0524  BP: 133/94 153/79 125/67 133/64  Pulse: 96 98 97 97  Temp:   98.3 F (36.8 C) 97.8 F (36.6 C)  TempSrc:   Oral Oral  Resp: 19 24 18 18   Height:      Weight:    107 lb 9.4 oz (48.8 kg)  SpO2: 98% 98% 96% 96%    Intake/Output Summary (Last 24 hours) at 01/19/15 0940 Last data filed at 01/19/15 0525  Gross per 24 hour  Intake    240 ml  Output   1050 ml  Net   -810 ml   Filed Weights   01/17/15 0448 01/18/15 0430 01/19/15 0524  Weight: 110 lb 14.3 oz (50.3 kg) 110 lb 3.7 oz (50 kg) 107 lb 9.4 oz (48.8 kg)    PHYSICAL EXAM  General: Pleasant, 69 yo woman,  NAD. Neuro: Alert and oriented X 3. Moves all extremities spontaneously. HEENT:  Normal  Neck: Supple without bruits or JVD. Lungs:  Resp regular and unlabored, CTA. No hematoma Heart: RRR no s3, s4, or murmurs. Abdomen: Soft, non-tender, non-distended, BS + x 4.  Extremities: No clubbing, cyanosis or edema. DP/PT/Radials 2+ and equal bilaterally.  Accessory Clinical Findings  CBC  Recent Labs  01/17/15 0414  WBC 6.0  HGB 7.5*  HCT 22.1*  MCV 92.1  PLT 841   Basic Metabolic Panel  Recent Labs  01/17/15 0414  NA 140  K 3.7  CL 104  CO2 28  GLUCOSE 130*  BUN 9  CREATININE 0.77  CALCIUM 8.3*   Liver Function Tests No results for input(s): AST, ALT, ALKPHOS, BILITOT, PROT, ALBUMIN in the last 72 hours. No results for input(s): LIPASE, AMYLASE in the last 72 hours. Cardiac Enzymes No results for input(s): CKTOTAL, CKMB, CKMBINDEX, TROPONINI in the last 72 hours. BNP Invalid input(s): POCBNP D-Dimer No results for input(s): DDIMER  in the last 72 hours. Hemoglobin A1C No results for input(s): HGBA1C in the last 72 hours. Fasting Lipid Panel No results for input(s): CHOL, HDL, LDLCALC, TRIG, CHOLHDL, LDLDIRECT in the last 72 hours. Thyroid Function Tests No results for input(s): TSH, T4TOTAL, T3FREE, THYROIDAB in the last 72 hours.  Invalid input(s): FREET3  TELE NSR with BiV Pacing  Radiology/Studies  Dg Chest 2 View  01/19/2015   CLINICAL DATA:  Sore left arm following defibrillator placement  EXAM: CHEST  2 VIEW  COMPARISON:  01/17/2015  FINDINGS: Postsurgical changes are again noted. A new defibrillator is seen on the left. No pneumothorax is noted. Blunting of the costophrenic angles is again noted bilaterally stable from the previous exam. No new focal infiltrate is seen. No acute bony abnormality is noted.  IMPRESSION: No pneumothorax following defibrillator placement. The remainder of the exam is stable.   Electronically Signed   By: Inez Catalina M.D.   On:  01/19/2015 07:46   Dg Chest 2 View  01/17/2015   CLINICAL DATA:  Pain.  Status post aortic valve replacement  EXAM: CHEST  2 VIEW  COMPARISON:  January 16, 2015  FINDINGS: There is a new small right pleural effusion. Small left effusion remains. There is patchy bibasilar atelectasis with atelectatic change in the right base new and changes on the left stable. Heart is enlarged with pulmonary vascularity within normal limits. No pneumothorax. No adenopathy. Patient is status post aortic valve replacement. Temporary pacemaker wires remain attached to the right heart.  IMPRESSION: New right base atelectasis and small right effusion. Persistent left lower lobe atelectatic change with small left effusion, not appreciably changed. Stable cardiomegaly. No pneumothorax.   Electronically Signed   By: Lowella Grip M.D.   On: 01/17/2015 07:42   Dg Chest 2 View  01/11/2015   CLINICAL DATA:  Preop aortic valve replacement and aortic aneurysm repair. Fatigue. Cough.  EXAM: CHEST  2 VIEW  COMPARISON:  11/09/2014  FINDINGS: The cardiomediastinal silhouette is within normal limits. The lungs remain hyperinflated with mild coarsening of the interstitial markings, compatible with underlying COPD. No airspace consolidation, pulmonary edema, pleural effusion, or pneumothorax is identified.  IMPRESSION: No active cardiopulmonary disease.   Electronically Signed   By: Logan Bores   On: 01/11/2015 09:53   Dg Chest Port 1 View  01/16/2015   CLINICAL DATA:  Aortic valve replacement.  EXAM: PORTABLE CHEST - 1 VIEW  COMPARISON:  01/15/2015.  FINDINGS: Prior areas valve replacement. Cardiomegaly with normal pulmonary vascularity. Left lower lobe atelectatic changes are present. Small left pleural effusion. No pneumothorax.  IMPRESSION: 1. Aortic valve replacement. Cardiomegaly. Normal pulmonary vascularity. 2. Left lower lobe atelectatic changes with small left pleural effusion.   Electronically Signed   By: Marcello Moores  Register    On: 01/16/2015 07:30   Dg Chest Port 1 View  01/15/2015   CLINICAL DATA:  Subsequent encounter for shortness of breath and cough.  EXAM: PORTABLE CHEST - 1 VIEW  COMPARISON:  1 day prior  FINDINGS: Right IJ Cordis sheath remains in place. Prior median sternotomy. Cardiomegaly accentuated by AP portable technique. Improved to resolved small left pleural effusion. Similar small left pleural effusion. No pneumothorax. Resolved interstitial edema. Improved left greater than right bibasilar airspace disease.  IMPRESSION: Improved aeration with improved to resolved congestive heart failure and right pleural effusion.  Small left pleural effusion with adjacent atelectasis remains.   Electronically Signed   By: Abigail Miyamoto M.D.   On: 01/15/2015 08:22  Dg Chest Port 1 View  01/14/2015   CLINICAL DATA:  Status post aortic valve replacement 01/12/2015.  EXAM: PORTABLE CHEST - 1 VIEW  COMPARISON:  Single view of the chest 01/13/2015.  FINDINGS: Right IJ approach Swan-Ganz catheter and chest tubes have been removed. Right IJ sheath remains in place. There has been some increase in small bilateral pleural effusions and basilar atelectasis. There is cardiomegaly but no pulmonary edema. No pneumothorax is identified.  IMPRESSION: Status post removal of chest tubes and right IJ catheter. Negative for pneumothorax.  Increased small bilateral pleural effusions and basilar atelectasis.  Cardiomegaly without edema.   Electronically Signed   By: Inge Rise M.D.   On: 01/14/2015 08:40   Dg Chest Port 1 View  01/13/2015   CLINICAL DATA:  Post aortic valve replacement.  EXAM: PORTABLE CHEST - 1 VIEW  COMPARISON:  01/12/2015  FINDINGS: Swan-Ganz catheter tip in the distal main pulmonary artery region and minimally changed. Again noted are chest drains near the midline. The endotracheal tube and nasogastric tube have been removed. Heart size is upper limits of normal but unchanged. The trachea is midline. Lungs are clear  without airspace disease or pulmonary edema. There is no evidence for a pneumothorax.  IMPRESSION: No acute chest findings.  Support apparatuses as described.  Negative for pneumothorax.   Electronically Signed   By: Markus Daft M.D.   On: 01/13/2015 08:08   Dg Chest Port 1 View  01/12/2015   CLINICAL DATA:  Immediate postop aortic valve replacement.  EXAM: PORTABLE CHEST - 1 VIEW  COMPARISON:  Two-view chest x-ray 01/11/2015 and dating back to 06/09/2009, with prior imaging performed at [facility] and [facility].  FINDINGS: Sternotomy for aortic valve replacement. Endotracheal tube tip in satisfactory position projecting approximately 3 cm above the carina. Swan-Ganz catheter tip projected over the expected location of lead main pulmonary trunk. Nasogastric tube courses below the diaphragm into the stomach with its tip in the gastric fundus. Mediastinal drainage tube. Cardiac silhouette mildly enlarged but stable. Pulmonary vascularity normal. Mild central peribronchial thickening, similar to prior examinations. Lungs otherwise clear.  IMPRESSION: 1. Support apparatus satisfactory. 2. No acute cardiopulmonary disease post aortic valve replacement.   Electronically Signed   By: Evangeline Dakin M.D.   On: 01/12/2015 14:36    ASSESSMENT AND PLAN 1. S/p BiV ICD 2. Chronic non-ischemic CM/Chronic systolic heart failure, EF 30% 3. Complete heart block after AVR/Bentall Rec: her device is working normally. Ok to remove temporary wires from my perspective. We will schedule followup. She will need discharge home on losartan and low dose coreg.  Gregg Taylor,M.D.  01/19/2015 9:40 AM

## 2015-01-19 NOTE — Progress Notes (Addendum)
      [address]       [place] [address]             [phone]        1 Day Post-Op Procedure(s) (LRB): BI-VENTRICULAR PACEMAKER INSERTION (CRT-P) (N/A)  Subjective: Patient has complaints of left shoulder pain (had bi vent ICD placed yesterday).  Objective: Vital signs in last 24 hours: Temp:  [97.8 F (36.6 C)-98.3 F (36.8 C)] 97.8 F (36.6 C) (01/21 0524) Pulse Rate:  [96-98] 97 (01/21 0524) Cardiac Rhythm:  [-] A-V Sequential paced;Ventricular paced (01/21 0811) Resp:  [18-24] 18 (01/21 0524) BP: (125-153)/(64-94) 133/64 mmHg (01/21 0524) SpO2:  [96 %-98 %] 96 % (01/21 0524) Weight:  [107 lb 9.4 oz (48.8 kg)] 107 lb 9.4 oz (48.8 kg) (01/21 0524)  Pre op weight 49 kg Current Weight  01/19/15 107 lb 9.4 oz (48.8 kg)      Intake/Output from previous day: 01/20 0701 - 01/21 0700 In: 440 [P.O.:440] Out: 1050 [Urine:1050]   Physical Exam:  Cardiovascular: RRR Pulmonary: Slightly decreased at bases; no rales, wheezes, or rhonchi. Abdomen: Soft, non tender, bowel sounds present. Extremities: Trace bilateral lower extremity edema. Wounds: Clean and dry.  No erythema or signs of infection.  Lab Results: CBC:  Recent Labs  01/17/15 0414  WBC 6.0  HGB 7.5*  HCT 22.1*  PLT 202   BMET:   Recent Labs  01/17/15 0414  NA 140  K 3.7  CL 104  CO2 28  GLUCOSE 130*  BUN 9  CREATININE 0.77  CALCIUM 8.3*    PT/INR:  Lab Results  Component Value Date   INR 1.41 01/12/2015   INR 0.91 01/11/2015   INR 1.04 09/23/2014   ABG:  INR: Will add last result for INR, ABG once components are confirmed Will add last 4 CBG results once components are confirmed  Assessment/Plan:  1. CV - S/p Bivent ICD for CHB.   2.  Pulmonary - On room air. CXR shows small bilateral pleural effusions and atelectasis, no pneumothorax.Encourage incentive spirometer 3. Volume Overload - On Lasix 40 daily. 4.  Acute blood loss anemia - Last H and H 7.5 and  22.1.On Trinsicon.  5. Once device has been interrogated, will disconnect external pacer and remove wires. 6. Possible discharge 1-2 days  ZIMMERMAN,DONIELLE MPA-C 01/19/2015,8:18 AM    Chart reviewed, patient examined, agree with above. She is doing well following insertion of biV ICD yesterday. Repeat echo reviewed and showed no change in preop EF of 30-35%. She is sensing A and pacing V at 100-105. Will remove temporary wires today and she should be able to go home tomorrow depending on the weather.

## 2015-01-20 MED ORDER — HYDROCODONE-ACETAMINOPHEN 5-325 MG PO TABS: 0.5000 | ORAL_TABLET | ORAL | Status: DC | PRN

## 2015-01-20 MED ORDER — FE FUMARATE-B12-VIT C-FA-IFC PO CAPS: 1.0000 | ORAL_CAPSULE | Freq: Every morning | ORAL | Status: DC

## 2015-01-20 MED ORDER — FUROSEMIDE 40 MG PO TABS
40.0000 mg | ORAL_TABLET | Freq: Every day | ORAL | Status: DC
  Filled 2015-01-20: qty 1

## 2015-01-20 MED ORDER — FUROSEMIDE 40 MG PO TABS: 40.0000 mg | ORAL_TABLET | Freq: Every day | ORAL | Status: DC

## 2015-01-20 MED ORDER — MULTIVITAMINS PO CAPS: 1.0000 | ORAL_CAPSULE | Freq: Every day | ORAL | Status: AC

## 2015-01-20 MED ORDER — NICOTINE 21 MG/24HR TD PT24: 21.0000 mg | MEDICATED_PATCH | Freq: Every day | TRANSDERMAL | Status: DC

## 2015-01-20 MED ORDER — POTASSIUM CHLORIDE CRYS ER 20 MEQ PO TBCR: 20.0000 meq | EXTENDED_RELEASE_TABLET | Freq: Every day | ORAL | Status: DC

## 2015-01-20 MED ORDER — CARVEDILOL 3.125 MG PO TABS
3.1250 mg | ORAL_TABLET | Freq: Two times a day (BID) | ORAL | Status: DC
  Administered 2015-01-20: 3.125 mg via ORAL
  Filled 2015-01-20 (×3): qty 1

## 2015-01-20 MED ORDER — LOSARTAN POTASSIUM 25 MG PO TABS: 25.0000 mg | ORAL_TABLET | Freq: Every day | ORAL | Status: DC

## 2015-01-20 MED ORDER — CARVEDILOL 3.125 MG PO TABS: 3.1250 mg | ORAL_TABLET | Freq: Two times a day (BID) | ORAL | Status: DC

## 2015-01-20 MED ORDER — LOSARTAN POTASSIUM 25 MG PO TABS
25.0000 mg | ORAL_TABLET | Freq: Every day | ORAL | Status: DC
  Administered 2015-01-20: 25 mg via ORAL
  Filled 2015-01-20: qty 1

## 2015-01-20 MED ORDER — ASPIRIN 325 MG PO TBEC: 325.0000 mg | DELAYED_RELEASE_TABLET | Freq: Every day | ORAL | Status: AC

## 2015-01-20 NOTE — Progress Notes (Signed)
CARDIAC REHAB PHASE I   PRE:  Rate/Rhythm:102  BP:  Sitting: 113/66      SaO2: 93  MODE:  Ambulation: 50 ft observed in hallway    [phone] Patient observed ambulating in hallway independently. Steady gait noted. Discharge education completed including post cardiac surgery, low NA/cardiac diet, phase 2 cardiac rehab, exercise and activity progression. Patient states she has a lot of anxiety. She has been dealing with this for a long period of time. Patient states she also does not have a strong support system. 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 Encouraged her to express her concerns to her family. Smoking cessation education also given. Teach back noted. Patient to be discharged today if weather permits and family able to come and get her. Post education, patient became extremely anxious and primary RN notified. Patient also c/o vertigo. Vitals stable as documented above. Primary RN at bedside.     Santina Evans, BSN 01/20/2015 9:43 AM

## 2015-01-20 NOTE — Progress Notes (Signed)
Discharged to home with family office visits in place teaching done  

## 2015-01-20 NOTE — Progress Notes (Addendum)
      [address]       [place] [address]             [phone]        2 Days Post-Op Procedure(s) (LRB): BI-VENTRICULAR PACEMAKER INSERTION (CRT-P) (N/A)  Subjective: Patient still with complaints of left shoulder pain (had bi vent ICD placed) and fatigue.  Objective: Vital signs in last 24 hours: Temp:  [97.8 F (36.6 C)-98.8 F (37.1 C)] 97.8 F (36.6 C) (01/22 0523) Pulse Rate:  [98-113] 113 (01/22 0523) Cardiac Rhythm:  [-] A-V Sequential paced;Ventricular paced (01/21 1951) Resp:  [18] 18 (01/22 0523) BP: (120-153)/(60-72) 120/72 mmHg (01/22 0523) SpO2:  [97 %-100 %] 98 % (01/22 0523) Weight:  [106 lb 7.7 oz (48.3 kg)] 106 lb 7.7 oz (48.3 kg) (01/22 0523)  Pre op weight 49 kg Current Weight  01/20/15 106 lb 7.7 oz (48.3 kg)      Intake/Output from previous day: 01/21 0701 - 01/22 0700 In: 960 [P.O.:960] Out: 700 [Urine:700]   Physical Exam:  Cardiovascular: HR in the low 100's, paced Pulmonary: Slightly decreased at bases; no rales, wheezes, or rhonchi. Abdomen: Soft, non tender, bowel sounds present. Extremities: Trace bilateral lower extremity edema. Wounds: Clean and dry.  No erythema or signs of infection.  Lab Results: CBC: No results for input(s): WBC, HGB, HCT, PLT in the last 72 hours. BMET:  No results for input(s): NA, K, CL, CO2, GLUCOSE, BUN, CREATININE, CALCIUM in the last 72 hours.  PT/INR:  Lab Results  Component Value Date   INR 1.41 01/12/2015   INR 0.91 01/11/2015   INR 1.04 09/23/2014   ABG:  INR: Will add last result for INR, ABG once components are confirmed Will add last 4 CBG results once components are confirmed  Assessment/Plan:  1. CV - S/p Bivent ICD for CHB.  Per Dr. Tanna Furry recommendations, she will be started on low dose Coreg and restarted on low dose Losartan.  2.  Pulmonary - On room air. CXR shows small bilateral pleural effusions and atelectasis, no pneumothorax.Encourage incentive  spirometer 3. Volume Overload - On Lasix 40 daily. Now below pre op weight so will stop. 4.  Acute blood loss anemia - Last H and H 7.5 and 22.1.On Trinsicon.  5. Remove sutures 6. Discharge when patient able to obtain transportation secondary to weather  ZIMMERMAN,DONIELLE MPA-C 01/20/2015,7:35 AM   Chart reviewed, patient examined, agree with above. She is doing well. Weight is below preop and there is trivial pedal edema. Will stop lasix. She can go home today if her husband can get out to pick her up

## 2015-01-21 LAB — TYPE AND SCREEN
ABO/RH(D): A POS
ANTIBODY SCREEN: NEGATIVE
UNIT DIVISION: 0
UNIT DIVISION: 0
UNIT DIVISION: 0
Unit division: 0

## 2015-01-30 ENCOUNTER — Ambulatory Visit (INDEPENDENT_AMBULATORY_CARE_PROVIDER_SITE_OTHER): Payer: Medicare Other | Admitting: Internal Medicine

## 2015-01-30 ENCOUNTER — Ambulatory Visit (INDEPENDENT_AMBULATORY_CARE_PROVIDER_SITE_OTHER): Payer: Medicare Other | Admitting: *Deleted

## 2015-01-30 ENCOUNTER — Ambulatory Visit (HOSPITAL_COMMUNITY)
Admission: RE | Admit: 2015-01-30 | Discharge: 2015-01-30 | Disposition: A | Discharge status: Home / Self Care | Payer: Medicare Other | Source: Ambulatory Visit | Attending: Internal Medicine | Admitting: Internal Medicine

## 2015-01-30 VITALS — HR 75

## 2015-01-30 DIAGNOSIS — I5022 Chronic systolic (congestive) heart failure: Secondary | ICD-10-CM

## 2015-01-30 DIAGNOSIS — I5021 Acute systolic (congestive) heart failure: Secondary | ICD-10-CM

## 2015-01-30 DIAGNOSIS — I517 Cardiomegaly: Secondary | ICD-10-CM | POA: Insufficient documentation

## 2015-01-30 DIAGNOSIS — R0689 Other abnormalities of breathing: Secondary | ICD-10-CM | POA: Insufficient documentation

## 2015-01-30 DIAGNOSIS — Z952 Presence of prosthetic heart valve: Secondary | ICD-10-CM | POA: Insufficient documentation

## 2015-01-30 LAB — MDC_IDC_ENUM_SESS_TYPE_INCLINIC
Brady Statistic AP VP Percent: 1.69 %
Brady Statistic AP VS Percent: 0 %
Brady Statistic RA Percent Paced: 1.69 %
Brady Statistic RV Percent Paced: 99.89 %
HIGH POWER IMPEDANCE MEASURED VALUE: 171 Ohm
HIGH POWER IMPEDANCE MEASURED VALUE: 53 Ohm
Lead Channel Impedance Value: 342 Ohm
Lead Channel Impedance Value: 418 Ohm
Lead Channel Impedance Value: 551 Ohm
Lead Channel Pacing Threshold Amplitude: 0.75 V
Lead Channel Pacing Threshold Pulse Width: 0.4 ms
Lead Channel Sensing Intrinsic Amplitude: 1.625 mV
Lead Channel Sensing Intrinsic Amplitude: 1.625 mV
Lead Channel Sensing Intrinsic Amplitude: 7.625 mV
Lead Channel Setting Pacing Amplitude: 3.5 V
Lead Channel Setting Pacing Amplitude: 3.5 V
Lead Channel Setting Pacing Pulse Width: 0.4 ms
MDC IDC MSMT BATTERY REMAINING LONGEVITY: 65 mo
MDC IDC MSMT BATTERY VOLTAGE: 3.05 V
MDC IDC MSMT LEADCHNL LV PACING THRESHOLD AMPLITUDE: 0.875 V
MDC IDC MSMT LEADCHNL LV PACING THRESHOLD PULSEWIDTH: 0.4 ms
MDC IDC MSMT LEADCHNL RA PACING THRESHOLD PULSEWIDTH: 0.4 ms
MDC IDC MSMT LEADCHNL RV PACING THRESHOLD AMPLITUDE: 0.5 V
MDC IDC MSMT LEADCHNL RV SENSING INTR AMPL: 7.625 mV
MDC IDC SESS DTM: 20160201163802
MDC IDC SET LEADCHNL RA PACING AMPLITUDE: 3.5 V
MDC IDC SET LEADCHNL RV PACING PULSEWIDTH: 0.4 ms
MDC IDC SET LEADCHNL RV SENSING SENSITIVITY: 0.3 mV
MDC IDC SET ZONE DETECTION INTERVAL: 350 ms
MDC IDC SET ZONE DETECTION INTERVAL: 360 ms
MDC IDC SET ZONE DETECTION INTERVAL: 450 ms
MDC IDC STAT BRADY AS VP PERCENT: 98.23 %
MDC IDC STAT BRADY AS VS PERCENT: 0.07 %
Zone Setting Detection Interval: 300 ms

## 2015-01-30 NOTE — Progress Notes (Signed)
Wound check appointment. Steri-strips removed. Wound without redness or edema. Incision edges approximated, wound well healed. Normal device function. Thresholds, sensing, and impedances consistent with implant measurements. Device programmed at 3.5V for extra safety margin until 3 month visit. Histogram distribution appropriate for patient and level of activity. No mode switches or ventricular arrhythmias noted. Patient educated about wound care, arm mobility, lifting restrictions, shock plan. ROV in 3 months with implanting physician.  The patient was extremely SOB with minimal exertion.  A CXR was to be done today and she will restart her furosemide 20mg  daily per Dr. Caryl Comes.  Follow up as scheduled.

## 2015-01-30 NOTE — Progress Notes (Signed)
Patient Care Team: Ernestene Kiel, MD as PCP - General (Internal Medicine)   HPI  Carol Warren is a 69 y.o. female Seen following device implantation. She underwent aortic valve replacement for bicuspid valve with Bentall procedure and ended up with postoperative heart block. She had left ventricular dysfunction and ended up receiving a CRT. She was discharged off of diuretics. She comes in today with complaints of shortness of breath  Past Medical History  Diagnosis Date  . 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 Osteopenia   . HSV-1 (herpes simplex virus 1) infection   . HSV-2 (herpes simplex virus 2) infection   . Bicuspid aortic valve     CONGENITAL  . Aortic aneurysm     CARDIOLOGIST IS DR. Bettina Gavia IN [place]  . Adrenal tumor   . Degenerative disc disease     CERVICAL AND LUMBAR (DR. Lorin Mercy)  . Smoker   . Anxiety   . Chronic sinusitis   . Chronic depression   . Osteoarthritis   . Pernicious anemia   . Insomnia   . Asthma   . Pericardial cyst   . Chronic fatigue   . Vitamin B 12 deficiency   . Plantar fasciitis   . Hammer toe of right foot   . Hepatomegaly   . Rosacea   . Esophagitis   . Gastritis   . PONV (postoperative nausea and vomiting)   . Shortness of breath dyspnea   . COPD (chronic obstructive pulmonary disease)   . GERD (gastroesophageal reflux disease)   . Carpal tunnel syndrome, bilateral   . Cancer     skin    Past Surgical History  Procedure Laterality Date  . Excision of vulvar cis    . Sinus procedure  2009    x 6  . Excision of basal cell ca      SKIN   . Cataract extraction Bilateral     X2  . Carpal tunnel release Bilateral 2008  . Bunionectomy with hammertoe reconstruction Right   . Colonoscopy    . Upper gastrointestinal endoscopy    . Tee without cardioversion N/A 10/06/2014    Procedure: TRANSESOPHAGEAL ECHOCARDIOGRAM (TEE);  Surgeon: Sueanne Margarita, MD;  Location: [facility] ENDOSCOPY;  Service: Cardiovascular;   Laterality: N/A;  . Left and right heart catheterization with coronary angiogram N/A 09/23/2014    Procedure: LEFT AND RIGHT HEART CATHETERIZATION WITH CORONARY ANGIOGRAM;  Surgeon: Minus Breeding, MD;  Location: [facility] CATH LAB;  Service: Cardiovascular;  Laterality: N/A;  . Aortic valve replacement N/A 01/12/2015    Procedure: AORTIC VALVE REPLACEMENT (AVR);  Surgeon: Gaye Pollack, MD;  Location: [place];  Service: Open Heart Surgery;  Laterality: N/A;  . Ascending aortic root replacement N/A 01/12/2015    Procedure: ASCENDING AORTIC ROOT REPLACEMENT;  Surgeon: Gaye Pollack, MD;  Location: [place];  Service: Open Heart Surgery;  Laterality: N/A;  . Tee without cardioversion N/A 01/12/2015    Procedure: TRANSESOPHAGEAL ECHOCARDIOGRAM (TEE);  Surgeon: Gaye Pollack, MD;  Location: [place];  Service: Open Heart Surgery;  Laterality: N/A;  . Bi-ventricular pacemaker insertion N/A 01/18/2015    Procedure: BI-VENTRICULAR PACEMAKER INSERTION (CRT-P);  Surgeon: Evans Lance, MD;  Location: [facility] CATH LAB;  Service: Cardiovascular;  Laterality: N/A;    Current Outpatient Prescriptions  Medication Sig Dispense Refill  . albuterol (PROVENTIL HFA;VENTOLIN HFA) 108 (90 BASE) MCG/ACT inhaler Inhale 1-2 puffs into the lungs every 6 (six) hours  as needed for wheezing or shortness of breath.    [name] albuterol (PROVENTIL) (2.5 MG/3ML) 0.083% nebulizer solution Take 2.5 mg by nebulization 2 (two) times daily as needed for wheezing.    . AMBULATORY NON FORMULARY MEDICATION Topical nasal rinse/ Tobram Budesonide Use nasal as needed    . aspirin EC 325 MG EC tablet Take 1 tablet (325 mg total) by mouth daily. 30 tablet 0  . B Complex Vitamins (VITAMIN B COMPLEX PO) Take 1 tablet by mouth daily.    . bisacodyl (DULCOLAX) 5 MG EC tablet Take 5 mg by mouth daily as needed for moderate constipation.    [name] buPROPion (WELLBUTRIN XL) 300 MG 24 hr tablet Take 300 mg by mouth daily.    . Calcium Carbonate-Vitamin D (CALCIUM-D PO) Take 1  tablet by mouth daily.     . carvedilol (COREG) 3.125 MG tablet Take 1 tablet (3.125 mg total) by mouth 2 (two) times daily with a meal. 60 tablet 1  . cyanocobalamin (,VITAMIN B-12,) 1000 MCG/ML injection Inject 1,000 mcg into the muscle once a week.    . diazepam (VALIUM) 10 MG tablet Take 10 mg by mouth 3 (three) times daily as needed for anxiety.    . ferrous [id number] C-folic acid (TRINSICON / FOLTRIN) capsule Take 1 capsule by mouth every morning. For one month then stop. 30 capsule 0  . fluticasone (FLONASE) 50 MCG/ACT nasal spray Place 1 spray into the nose 2 (two) times daily.    [name] HYDROcodone-acetaminophen (NORCO/VICODIN) 5-325 MG per tablet Take 0.5 tablets by mouth every 4 (four) hours as needed for moderate pain. 30 tablet 0  . loratadine (CLARITIN) 10 MG tablet Take 10 mg by mouth daily.    [name] losartan (COZAAR) 25 MG tablet Take 1 tablet (25 mg total) by mouth daily. 30 tablet 1  . meloxicam (MOBIC) 15 MG tablet Take 15 mg by mouth daily as needed for pain.    . Menthol, Topical Analgesic, (ICY HOT EX) Apply 1 patch topically daily.    . montelukast (SINGULAIR) 10 MG tablet Take 10 mg by mouth at bedtime.    Derrill Memo ON 02/21/2015] Multiple Vitamin (MULTIVITAMIN) capsule Take 1 capsule by mouth daily.    . nicotine (NICODERM CQ - DOSED IN MG/24 HOURS) 21 mg/24hr patch Place 1 patch (21 mg total) onto the skin daily. 28 patch 0  . Olopatadine HCl (PATANASE) 0.6 % SOLN Place 2 drops into both nostrils 2 (two) times daily.     [name] omeprazole (PRILOSEC) 20 MG capsule Take 40 mg by mouth daily.    . polyethylene glycol (MIRALAX / GLYCOLAX) packet Take 17 g by mouth daily as needed.    . Polyvinyl Alcohol-Povidone (REFRESH OP) Place 2 drops into both eyes 2 (two) times daily.    . Probiotic Product (PROBIOTIC DAILY PO) Take 1 capsule by mouth daily. Ultimate flora    . pseudoephedrine-guaifenesin (MUCINEX D) 60-600 MG per tablet Take 1 tablet by mouth as needed for congestion.    .  ranitidine (ZANTAC) 300 MG capsule Take 300 mg by mouth every evening.    . Saline (SIMPLY SALINE) 0.9 % AERS Place 2 sprays into both nostrils 2 (two) times daily.    . traZODone (DESYREL) 50 MG tablet Take 50-75 mg by mouth at bedtime.      No current facility-administered medications for this visit.    Allergies  Allergen Reactions  . Oxycodone Nausea Only    Review of Systems negative except from  HPI and PMH  Physical Exam There were no vitals taken for this visit. Well developed and well nourished in no acute distress HENT normal E scleral and icterus clear Neck Supple JVP8-9 cm  carotids brisk and full Diffuse crackles  Regular rate and rhythm, no murmurs gallops or rub Soft with active bowel sounds No clubbing cyanosis 2+ Edema Alert and oriented, grossly normal motor and sensory function Skin Warm and Dry    Assessment and  Plan  Congestive heart failure acute on chronic-systolic  Recent aortic valve replacement and Bentall procedure  Recent device implantation  The patient has evidence of congestive heart failure. Her diuretic was discontinued. I suspect that this is related primarily to volume overload. We will begin her on a diuretic today. If over the next 48 hours or if no significant improvement, or interval worsening, she will need an echo to exclude periprocedural pericardial effusion

## 2015-01-31 ENCOUNTER — Telehealth: Payer: Self-pay | Admitting: Internal Medicine

## 2015-01-31 NOTE — Telephone Encounter (Signed)
Informed patient of preliminary results and advised Dr. Caryl Comes had no orders/comments after seeing CXR results. She and I discussed findings and what they meant. She verbalized understanding.

## 2015-01-31 NOTE — Telephone Encounter (Signed)
New message      Pt want xray results---home phone not working--please call cell

## 2015-02-02 ENCOUNTER — Telehealth: Payer: Self-pay | Admitting: Internal Medicine

## 2015-02-02 NOTE — Telephone Encounter (Signed)
Advised pt to wait until 02/28/15 to pick up 25lbs w/ her left arm. I explained she does not have a weight limitation on her right arm (left sided implant)

## 2015-02-02 NOTE — Telephone Encounter (Signed)
New Msg       Pt calling, wants to know if she has a lifting restriction and how far can she travel away from home?   Likes to lift dog who is 25lbs and wants to travel.    Please return pt call.

## 2015-02-06 ENCOUNTER — Other Ambulatory Visit: Payer: Self-pay | Admitting: *Deleted

## 2015-02-06 DIAGNOSIS — G8918 Other acute postprocedural pain: Secondary | ICD-10-CM

## 2015-02-06 MED ORDER — HYDROCODONE-ACETAMINOPHEN 5-325 MG PO TABS: 0.5000 | ORAL_TABLET | ORAL | Status: DC | PRN

## 2015-02-06 NOTE — Telephone Encounter (Signed)
Carol Warren called requesting a refill for for he  Norco 3/325mg  s/p AVR. I said a new RX would be available today at our office.  She said she would have her son pick it up today.

## 2015-02-07 ENCOUNTER — Telehealth: Payer: Self-pay | Admitting: Cardiology

## 2015-02-07 NOTE — Telephone Encounter (Signed)
Pt. Called with Dr. Rosezella [place] instructions

## 2015-02-07 NOTE — Telephone Encounter (Signed)
OK to take a brief trip if she has no significant pain or any symptoms such as SOB.

## 2015-02-07 NOTE — Telephone Encounter (Signed)
Pt had AVR 1/14 & pacemaker implanted 1/20. She states restlessness staying in the house, wanted to go out of town for a few days to be w/ family but wanted to make sure she had 53 OK.  Pt notes fatigue since surgery. She has been limiting activity as directed. O/w no complaints.

## 2015-02-07 NOTE — Telephone Encounter (Signed)
Pt called in wanting to know if the doctor approved for her traveling to [place] to stay with her sister for a week. She is inquiring because she just had surgery on 1/14. Please f/u   Thanks

## 2015-02-08 ENCOUNTER — Telehealth: Payer: Self-pay | Admitting: *Deleted

## 2015-02-08 NOTE — Telephone Encounter (Signed)
Patient is still feeling "yucky".  She states that since valve surgery she hasn't felt good.  She has no energy. She tells me that she is seeing Dr. Lovena Le and Dr. Percival Spanish on the 18th.  She would like to call Hochrein's office and get echo scheduled through there, she thinks she is already scheduled for one (even though I don't see this).  Patient states she will call his office to arrange this.

## 2015-02-08 NOTE — Telephone Encounter (Signed)
Carol Sprang, MD   Sent: Mon January 30, 2015 5:28 PM    To: [name], RN        Message     Carol Warren Please make a note to call and if symptoms have not improved arrange for echo to exclude effusion following pacemaker

## 2015-02-09 ENCOUNTER — Telehealth: Payer: Self-pay | Admitting: Internal Medicine

## 2015-02-09 DIAGNOSIS — Z950 Presence of cardiac pacemaker: Secondary | ICD-10-CM

## 2015-02-09 DIAGNOSIS — I359 Nonrheumatic aortic valve disorder, unspecified: Secondary | ICD-10-CM

## 2015-02-09 NOTE — Telephone Encounter (Signed)
Spoke with pt who reports symptoms have not improved. Is coughing, has sinus drainage, shoulder pain near pacer site. Will schedule echo.(see phone note 02/08/15)  Appt available today but pt has no transportation. Will have schedulers contact pt to schedule appt

## 2015-02-09 NOTE — Telephone Encounter (Signed)
This is a Caryl Comes patient will forward to triage and Trinidad Curet, RN

## 2015-02-09 NOTE — Telephone Encounter (Signed)
New message      Pt is calling to see if Dr Lovena Le want her to have an echo.  Please call

## 2015-02-10 ENCOUNTER — Ambulatory Visit (HOSPITAL_COMMUNITY)
Admission: RE | Admit: 2015-02-10 | Discharge: 2015-02-10 | Disposition: A | Discharge status: Home / Self Care | Payer: Medicare Other | Source: Ambulatory Visit | Attending: Cardiovascular Disease | Admitting: Cardiovascular Disease

## 2015-02-10 DIAGNOSIS — I359 Nonrheumatic aortic valve disorder, unspecified: Secondary | ICD-10-CM | POA: Insufficient documentation

## 2015-02-10 DIAGNOSIS — Z720 Tobacco use: Secondary | ICD-10-CM | POA: Diagnosis not present

## 2015-02-10 DIAGNOSIS — Z950 Presence of cardiac pacemaker: Secondary | ICD-10-CM

## 2015-02-10 NOTE — Progress Notes (Signed)
2D Echocardiogram Complete.  02/10/2015   Aerika Groll, RDCS  

## 2015-02-16 ENCOUNTER — Encounter: Payer: Self-pay | Admitting: Internal Medicine

## 2015-02-16 ENCOUNTER — Telehealth: Payer: Self-pay | Admitting: Internal Medicine

## 2015-02-16 ENCOUNTER — Ambulatory Visit: Payer: Medicare Other | Admitting: Cardiology

## 2015-02-16 ENCOUNTER — Ambulatory Visit (INDEPENDENT_AMBULATORY_CARE_PROVIDER_SITE_OTHER): Payer: Medicare Other | Admitting: Internal Medicine

## 2015-02-16 ENCOUNTER — Telehealth: Payer: Self-pay | Admitting: Cardiology

## 2015-02-16 VITALS — BP 122/60 | HR 92 | Ht 61.0 in | Wt 108.4 lb

## 2015-02-16 DIAGNOSIS — I2722 Pulmonary hypertension due to left heart disease: Secondary | ICD-10-CM

## 2015-02-16 DIAGNOSIS — I442 Atrioventricular block, complete: Secondary | ICD-10-CM

## 2015-02-16 DIAGNOSIS — I272 Other secondary pulmonary hypertension: Secondary | ICD-10-CM

## 2015-02-16 DIAGNOSIS — I5032 Chronic diastolic (congestive) heart failure: Secondary | ICD-10-CM | POA: Insufficient documentation

## 2015-02-16 DIAGNOSIS — I519 Heart disease, unspecified: Secondary | ICD-10-CM

## 2015-02-16 DIAGNOSIS — I429 Cardiomyopathy, unspecified: Secondary | ICD-10-CM

## 2015-02-16 DIAGNOSIS — Z95810 Presence of automatic (implantable) cardiac defibrillator: Secondary | ICD-10-CM

## 2015-02-16 DIAGNOSIS — I428 Other cardiomyopathies: Secondary | ICD-10-CM

## 2015-02-16 DIAGNOSIS — I5022 Chronic systolic (congestive) heart failure: Secondary | ICD-10-CM

## 2015-02-16 LAB — MDC_IDC_ENUM_SESS_TYPE_INCLINIC
Battery Remaining Longevity: 81 mo
Battery Voltage: 3.05 V
Brady Statistic AP VP Percent: 0.99 %
Brady Statistic AP VS Percent: 0.01 %
Brady Statistic AS VP Percent: 98.92 %
Brady Statistic RV Percent Paced: 99.89 %
HIGH POWER IMPEDANCE MEASURED VALUE: 53 Ohm
HighPow Impedance: 190 Ohm
Lead Channel Impedance Value: 456 Ohm
Lead Channel Pacing Threshold Amplitude: 0.75 V
Lead Channel Pacing Threshold Amplitude: 0.75 V
Lead Channel Pacing Threshold Amplitude: 1 V
Lead Channel Pacing Threshold Pulse Width: 0.4 ms
Lead Channel Pacing Threshold Pulse Width: 0.4 ms
Lead Channel Setting Pacing Amplitude: 2.5 V
Lead Channel Setting Pacing Amplitude: 3.5 V
Lead Channel Setting Pacing Amplitude: 3.5 V
Lead Channel Setting Pacing Pulse Width: 0.4 ms
Lead Channel Setting Sensing Sensitivity: 0.3 mV
MDC IDC MSMT LEADCHNL RA PACING THRESHOLD PULSEWIDTH: 0.4 ms
MDC IDC MSMT LEADCHNL RA SENSING INTR AMPL: 2 mV
MDC IDC MSMT LEADCHNL RV IMPEDANCE VALUE: 551 Ohm
MDC IDC MSMT LEADCHNL RV SENSING INTR AMPL: 9.25 mV
MDC IDC SESS DTM: 20160218121954
MDC IDC SET LEADCHNL LV PACING PULSEWIDTH: 0.4 ms
MDC IDC SET ZONE DETECTION INTERVAL: 360 ms
MDC IDC STAT BRADY AS VS PERCENT: 0.08 %
MDC IDC STAT BRADY RA PERCENT PACED: 1 %
Zone Setting Detection Interval: 300 ms
Zone Setting Detection Interval: 350 ms
Zone Setting Detection Interval: 450 ms

## 2015-02-16 NOTE — Telephone Encounter (Signed)
Informed pt that transmission was not received . And I instructed her how to send a manual transmission.

## 2015-02-16 NOTE — Telephone Encounter (Signed)
Siegrist may take 4 a week  Patient aware

## 2015-02-16 NOTE — Assessment & Plan Note (Signed)
Her dyspnea is improved. Will recheck in several months.

## 2015-02-16 NOTE — Assessment & Plan Note (Signed)
Her symptoms are class 2. She will continue her current meds and I have asked her to maintain a low sodium diet.

## 2015-02-16 NOTE — Patient Instructions (Signed)
Keep appointment as scheduled with Dr Lovena Le

## 2015-02-16 NOTE — Assessment & Plan Note (Signed)
Her medtronic BiV ICD is working normally. Will recheck in a couple of months.

## 2015-02-16 NOTE — Telephone Encounter (Signed)
New Message  Pt received Medtronic box from [facility] today (2/18). Pt was told to press grey button three times. Pt calling to see if it worked. Please call back and discuss.

## 2015-02-16 NOTE — Progress Notes (Signed)
HPI Carol Warren returns today for followup. She is a pleasant middle aged woman who underwent AVR several weeks ago complicated by the development of CHB. A 2 D echo post op demonstrated and EF of 30%. After considering the various treatment options she underwent insertion of a BiV ICD. She initially developed sob after the procedure at discharge and it was discovered that she was discharged on no diuretic. She was placed on low dose lasix and returns today for followup. She c/o sinus drainage but is otherwise stable. No fever or chills. No ICD shock or syncope. No edema.  Allergies  Allergen Reactions  . Oxycodone Nausea Only     Current Outpatient Prescriptions  Medication Sig Dispense Refill  . albuterol (PROVENTIL HFA;VENTOLIN HFA) 108 (90 BASE) MCG/ACT inhaler Inhale 1-2 puffs into the lungs every 6 (six) hours as needed for wheezing or shortness of breath.    [name] albuterol (PROVENTIL) (2.5 MG/3ML) 0.083% nebulizer solution Take 2.5 mg by nebulization 2 (two) times daily as needed for wheezing.    . AMBULATORY NON FORMULARY MEDICATION Topical nasal rinse/ Tobram Budesonide Use nasal rinse daily as needed for dryness    . aspirin EC 325 MG EC tablet Take 1 tablet (325 mg total) by mouth daily. 30 tablet 0  . bisacodyl (DULCOLAX) 5 MG EC tablet Take 5 mg by mouth daily as needed for moderate constipation.    [name] buPROPion (WELLBUTRIN XL) 300 MG 24 hr tablet Take 300 mg by mouth daily.    . Calcium Carbonate-Vitamin D (CALCIUM-D PO) Take 1 tablet by mouth daily.     . carvedilol (COREG) 3.125 MG tablet Take 1 tablet (3.125 mg total) by mouth 2 (two) times daily with a meal. 60 tablet 1  . diazepam (VALIUM) 10 MG tablet Take 10 mg by mouth 3 (three) times daily as needed for anxiety.    . ferrous [id number] C-folic acid (TRINSICON / FOLTRIN) capsule Take 1 capsule by mouth every morning. For one month then stop. 30 capsule 0  . fluticasone (FLONASE) 50 MCG/ACT nasal spray Place 1  spray into the nose 2 (two) times daily as needed for allergies or rhinitis.     [name] HYDROcodone-acetaminophen (NORCO/VICODIN) 5-325 MG per tablet Take 0.5 tablets by mouth every 4 (four) hours as needed for moderate pain. 40 tablet 0  . loratadine (CLARITIN) 10 MG tablet Take 10 mg by mouth daily.    [name] losartan (COZAAR) 25 MG tablet Take 1 tablet (25 mg total) by mouth daily. 30 tablet 1  . meloxicam (MOBIC) 15 MG tablet Take 15 mg by mouth daily as needed for pain.    . Menthol, Topical Analgesic, (ICY HOT EX) Apply 1 patch topically daily.    . montelukast (SINGULAIR) 10 MG tablet Take 10 mg by mouth at bedtime.    Derrill Memo ON 02/21/2015] Multiple Vitamin (MULTIVITAMIN) capsule Take 1 capsule by mouth daily.    . nicotine (NICODERM CQ - DOSED IN MG/24 HOURS) 21 mg/24hr patch Place 1 patch (21 mg total) onto the skin daily. 28 patch 0  . Olopatadine HCl (PATANASE) 0.6 % SOLN Place 2 drops into both nostrils 2 (two) times daily.     [name] omeprazole (PRILOSEC) 20 MG capsule Take 40 mg by mouth daily.    . polyethylene glycol (MIRALAX / GLYCOLAX) packet Take 17 g by mouth daily as needed for mild constipation.     . Polyvinyl Alcohol-Povidone (REFRESH OP) Place 2 drops into both eyes  2 (two) times daily.    . Probiotic Product (PROBIOTIC DAILY PO) Take 1 capsule by mouth daily. Ultimate flora    . pseudoephedrine-guaifenesin (MUCINEX D) 60-600 MG per tablet Take 1 tablet by mouth daily as needed for congestion.     . ranitidine (ZANTAC) 300 MG capsule Take 300 mg by mouth every evening.    . Saline (SIMPLY SALINE) 0.9 % AERS Place 2 sprays into both nostrils 2 (two) times daily.    . traZODone (DESYREL) 50 MG tablet Take 50-75 mg by mouth at bedtime.     . B Complex Vitamins (VITAMIN B COMPLEX PO) Take 1 tablet by mouth daily. ON HOLD UNTIL 02/20/15    . cyanocobalamin (,VITAMIN B-12,) 1000 MCG/ML injection Inject 1,000 mcg into the muscle once a week. ON HOLD UNTIL 02/20/15     No current  facility-administered medications for this visit.     Past Medical History  Diagnosis Date  . 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 Osteopenia   . HSV-1 (herpes simplex virus 1) infection   . HSV-2 (herpes simplex virus 2) infection   . Bicuspid aortic valve     CONGENITAL  . Aortic aneurysm     CARDIOLOGIST IS DR. Bettina Gavia IN [place]  . Adrenal tumor   . Degenerative disc disease     CERVICAL AND LUMBAR (DR. Lorin Mercy)  . Smoker   . Anxiety   . Chronic sinusitis   . Chronic depression   . Osteoarthritis   . Pernicious anemia   . Insomnia   . Asthma   . Pericardial cyst   . Chronic fatigue   . Vitamin B 12 deficiency   . Plantar fasciitis   . Hammer toe of right foot   . Hepatomegaly   . Rosacea   . Esophagitis   . Gastritis   . PONV (postoperative nausea and vomiting)   . Shortness of breath dyspnea   . COPD (chronic obstructive pulmonary disease)   . GERD (gastroesophageal reflux disease)   . Carpal tunnel syndrome, bilateral   . Cancer     skin    ROS:   All systems reviewed and negative except as noted in the HPI.   Past Surgical History  Procedure Laterality Date  . Excision of vulvar cis    . Sinus procedure  2009    x 6  . Excision of basal cell ca      SKIN   . Cataract extraction Bilateral     X2  . Carpal tunnel release Bilateral 2008  . Bunionectomy with hammertoe reconstruction Right   . Colonoscopy    . Upper gastrointestinal endoscopy    . Tee without cardioversion N/A 10/06/2014    Procedure: TRANSESOPHAGEAL ECHOCARDIOGRAM (TEE);  Surgeon: Sueanne Margarita, MD;  Location: [facility] ENDOSCOPY;  Service: Cardiovascular;  Laterality: N/A;  . Left and right heart catheterization with coronary angiogram N/A 09/23/2014    Procedure: LEFT AND RIGHT HEART CATHETERIZATION WITH CORONARY ANGIOGRAM;  Surgeon: Minus Breeding, MD;  Location: [facility] CATH LAB;  Service: Cardiovascular;  Laterality: N/A;  . Aortic valve replacement N/A 01/12/2015    Procedure:  AORTIC VALVE REPLACEMENT (AVR);  Surgeon: Gaye Pollack, MD;  Location: [place];  Service: Open Heart Surgery;  Laterality: N/A;  . Ascending aortic root replacement N/A 01/12/2015    Procedure: ASCENDING AORTIC ROOT REPLACEMENT;  Surgeon: Gaye Pollack, MD;  Location: [place];  Service: Open Heart Surgery;  Laterality: N/A;  .  Tee without cardioversion N/A 01/12/2015    Procedure: TRANSESOPHAGEAL ECHOCARDIOGRAM (TEE);  Surgeon: Gaye Pollack, MD;  Location: [place];  Service: Open Heart Surgery;  Laterality: N/A;  . Bi-ventricular pacemaker insertion N/A 01/18/2015    Procedure: BI-VENTRICULAR PACEMAKER INSERTION (CRT-P);  Surgeon: Evans Lance, MD;  Location: [facility] CATH LAB;  Service: Cardiovascular;  Laterality: N/A;     Family History  Problem Relation Age of Onset  . Pancreatic cancer Mother 40  . Diabetes Father   . Hypertension Father   . Heart disease Father 29    CAD  . Breast cancer Sister   . Diabetes Maternal Grandmother   . Hypertension Maternal Grandmother   . Cancer Maternal Grandfather     colon or stomach  . Hypertension Paternal Grandmother   . Heart disease Paternal Grandmother     Later onset  . Asthma Grandchild   . Diverticulosis Paternal Grandmother      History   Social History  . Marital Status: Married    Spouse Name: N/A  . Number of Children: 2  . Years of Education: N/A   Occupational History  . retired    Social History Main Topics  . Smoking status: Current Every Day Smoker -- 1.00 packs/day for 50 years    Types: Cigarettes  . Smokeless tobacco: Never Used     Comment: uses vapor cig  . Alcohol Use: No  . Drug Use: No  . Sexual Activity: Not on file   Other Topics Concern  . Not on file   Social History Narrative     BP 122/60 mmHg  Pulse 92  Ht 5\' 1"  (1.549 m)  Wt 108 lb 6.4 oz (49.17 kg)  BMI 20.49 kg/m2  Physical Exam:  Well appearing 69 yo woman, NAD HEENT: Unremarkable Neck:  No JVD, no thyromegally Lymphatics:  No  adenopathy Back:  No CVA tenderness Lungs:  Clear with no wheezes or rales. HEART:  Regular rate rhythm, no murmurs, no rubs, no clicks Abd:  soft, positive bowel sounds, no organomegally, no rebound, no guarding Ext:  2 plus pulses, no edema, no cyanosis, no clubbing Skin:  No rashes no nodules Neuro:  CN II through XII intact, motor grossly intact  EKG  DEVICE  Normal device function.  See PaceArt for details.   Assess/Plan:

## 2015-02-16 NOTE — Telephone Encounter (Signed)
While instructed pt how to send manual transmission she wanted to know if she can start taking Advil again? Please call pt back and let her know if this is ok?

## 2015-02-17 ENCOUNTER — Telehealth: Payer: Self-pay | Admitting: Cardiology

## 2015-02-17 ENCOUNTER — Encounter: Payer: Self-pay | Admitting: Internal Medicine

## 2015-02-17 NOTE — Telephone Encounter (Signed)
Pt called wanting to know if it was ok to take an antibiotic. Informed pt that this is ok. Pt also stated that she had some pain in her site after interrogation at wound check and wanted to know if the interrogation would cause pain. Informed pt that it would not cause pain. Pt verbalized understanding. Informed pt that when sees MD in April she will be give a remote follow up appt at that time.

## 2015-02-21 ENCOUNTER — Other Ambulatory Visit: Payer: Self-pay | Admitting: *Deleted

## 2015-02-22 ENCOUNTER — Other Ambulatory Visit: Payer: Self-pay | Admitting: *Deleted

## 2015-02-22 DIAGNOSIS — G8918 Other acute postprocedural pain: Secondary | ICD-10-CM

## 2015-02-22 MED ORDER — HYDROCODONE-ACETAMINOPHEN 5-325 MG PO TABS: 0.5000 | ORAL_TABLET | ORAL | Status: DC | PRN

## 2015-02-22 NOTE — Telephone Encounter (Signed)
Carol Warren called yesterday stating she would need a refill for her pain med by the weekend. I said she would have to come to the office for a new script .  She asked if I could mail it to her home.  I said I would have Dr. Cyndia Bent sign a new script on Wednesday and mail it and she agreed.

## 2015-02-23 ENCOUNTER — Telehealth: Payer: Self-pay | Admitting: Cardiology

## 2015-02-23 NOTE — Telephone Encounter (Signed)
Pt enrolled in ICM clinic.

## 2015-02-27 ENCOUNTER — Other Ambulatory Visit: Payer: Self-pay | Admitting: Surgery

## 2015-02-27 ENCOUNTER — Encounter: Payer: Self-pay | Admitting: *Deleted

## 2015-02-27 DIAGNOSIS — Q231 Congenital insufficiency of aortic valve: Secondary | ICD-10-CM

## 2015-03-01 ENCOUNTER — Encounter: Payer: Self-pay | Admitting: Surgery

## 2015-03-01 ENCOUNTER — Ambulatory Visit (INDEPENDENT_AMBULATORY_CARE_PROVIDER_SITE_OTHER): Payer: Self-pay | Admitting: Surgery

## 2015-03-01 ENCOUNTER — Ambulatory Visit
Admission: RE | Admit: 2015-03-01 | Discharge: 2015-03-01 | Disposition: A | Discharge status: Home / Self Care | Payer: Medicare Other | Source: Ambulatory Visit | Attending: Surgery | Admitting: Surgery

## 2015-03-01 VITALS — BP 108/64 | HR 68 | Resp 18 | Ht 61.0 in | Wt 105.2 lb

## 2015-03-01 DIAGNOSIS — I712 Thoracic aortic aneurysm, without rupture: Secondary | ICD-10-CM

## 2015-03-01 DIAGNOSIS — Q2549 Other congenital malformations of aorta: Secondary | ICD-10-CM

## 2015-03-01 DIAGNOSIS — Q231 Congenital insufficiency of aortic valve: Secondary | ICD-10-CM

## 2015-03-01 DIAGNOSIS — Q254 Other congenital malformations of aorta: Secondary | ICD-10-CM

## 2015-03-01 DIAGNOSIS — I350 Nonrheumatic aortic (valve) stenosis: Secondary | ICD-10-CM

## 2015-03-01 DIAGNOSIS — I7123 Aneurysm of the descending thoracic aorta, without rupture: Secondary | ICD-10-CM

## 2015-03-01 NOTE — Progress Notes (Signed)
HPI:  Patient returns for routine postoperative follow-up having undergone Biological Bentall procedure with a 23 mm Edwards Magna-Ease pericardial valve on 01/12/2015. The patient's early postoperative recovery while in the hospital was notable for complete heart block followed by high grade heart block with 3:1 and 2:1 conduction. She under went implantation of a biventricular ICD on 01/18/2015 due to the heart block and non-ischemic cardiomyopathy. Since hospital discharge the patient reports that she has been slowly improving but continues to have some shortness of breath and generalized weakness. She is walking. She is smoking an Psychologist, sport and exercise .   Current Outpatient Prescriptions  Medication Sig Dispense Refill  . albuterol (PROVENTIL HFA;VENTOLIN HFA) 108 (90 BASE) MCG/ACT inhaler Inhale 1-2 puffs into the lungs every 6 (six) hours as needed for wheezing or shortness of breath.    [name] albuterol (PROVENTIL) (2.5 MG/3ML) 0.083% nebulizer solution Take 2.5 mg by nebulization 2 (two) times daily as needed for wheezing.    . AMBULATORY NON FORMULARY MEDICATION Topical nasal rinse/ Tobram Budesonide Use nasal rinse daily as needed for dryness    . aspirin EC 325 MG EC tablet Take 1 tablet (325 mg total) by mouth daily. 30 tablet 0  . B Complex Vitamins (VITAMIN B COMPLEX PO) Take 1 tablet by mouth daily. ON HOLD UNTIL 02/20/15    . bisacodyl (DULCOLAX) 5 MG EC tablet Take 5 mg by mouth daily as needed for moderate constipation.    [name] buPROPion (WELLBUTRIN XL) 300 MG 24 hr tablet Take 300 mg by mouth daily.    . Calcium Carbonate-Vitamin D (CALCIUM-D PO) Take 1 tablet by mouth daily.     . carvedilol (COREG) 3.125 MG tablet Take 1 tablet (3.125 mg total) by mouth 2 (two) times daily with a meal. 60 tablet 1  . cyanocobalamin (,VITAMIN B-12,) 1000 MCG/ML injection Inject 1,000 mcg into the muscle once a week. ON HOLD UNTIL 02/20/15    . diazepam (VALIUM) 10 MG tablet Take 10 mg by mouth 3 (three) times  daily as needed for anxiety.    . ferrous [id number] C-folic acid (TRINSICON / FOLTRIN) capsule Take 1 capsule by mouth every morning. For one month then stop. 30 capsule 0  . fluticasone (FLONASE) 50 MCG/ACT nasal spray Place 1 spray into the nose 2 (two) times daily as needed for allergies or rhinitis.     [name] HYDROcodone-acetaminophen (NORCO/VICODIN) 5-325 MG per tablet Take 0.5 tablets by mouth every 4 (four) hours as needed for moderate pain. 40 tablet 0  . loratadine (CLARITIN) 10 MG tablet Take 10 mg by mouth daily.    [name] losartan (COZAAR) 25 MG tablet Take 1 tablet (25 mg total) by mouth daily. 30 tablet 1  . Menthol, Topical Analgesic, (ICY HOT EX) Apply 1 patch topically daily.    . montelukast (SINGULAIR) 10 MG tablet Take 10 mg by mouth at bedtime.    . Multiple Vitamin (MULTIVITAMIN) capsule Take 1 capsule by mouth daily.    . nicotine (NICODERM CQ - DOSED IN MG/24 HOURS) 21 mg/24hr patch Place 1 patch (21 mg total) onto the skin daily. 28 patch 0  . Olopatadine HCl (PATANASE) 0.6 % SOLN Place 2 drops into both nostrils 2 (two) times daily.     [name] omeprazole (PRILOSEC) 20 MG capsule Take 40 mg by mouth daily.    . polyethylene glycol (MIRALAX / GLYCOLAX) packet Take 17 g by mouth daily as needed for mild constipation.     . Polyvinyl Alcohol-Povidone (  REFRESH OP) Place 2 drops into both eyes 2 (two) times daily.    . Probiotic Product (PROBIOTIC DAILY PO) Take 1 capsule by mouth daily. Ultimate flora    . pseudoephedrine-guaifenesin (MUCINEX D) 60-600 MG per tablet Take 1 tablet by mouth daily as needed for congestion.     . ranitidine (ZANTAC) 300 MG capsule Take 300 mg by mouth every evening.    . Saline (SIMPLY SALINE) 0.9 % AERS Place 2 sprays into both nostrils 2 (two) times daily.    . traZODone (DESYREL) 50 MG tablet Take 50-75 mg by mouth at bedtime.     . meloxicam (MOBIC) 15 MG tablet Take 15 mg by mouth daily as needed for pain.     No current facility-administered  medications for this visit.    Physical Exam: BP 108/64 mmHg  Pulse 68  Resp 18  Ht 5\' 1"  (1.549 m)  Wt 105 lb 3.2 oz (47.718 kg)  BMI 19.89 kg/m2  SpO2 98% She looks well Lungs are clear Cardiac exam shows a regular rate and rhythm with normal heart sounds. There is no murmur The chest incision is healing well and the sternum is stable. The ICD incision looks good. There is no peripheral edema.   Diagnostic Tests:  CLINICAL DATA: Aortic valve replacement. Shortness of breath.  EXAM: CHEST 2 VIEW  COMPARISON: 01/30/2015.  FINDINGS: Mediastinum and hilar structures are normal. Cardiac pacer with lead tips in right atrium right ventricle. Prior median sternotomy and aortic valve replacement. Stable cardiomegaly. No pulmonary venous congestion. No evidence congestive heart failure. Stable mild basilar pleural thickening most likely relate to scarring. No pneumothorax. No acute bony abnormality.  IMPRESSION: Stable chest. Stable cardiomegaly, no evidence of overt congestive heart failure. Prior aortic valve replacement. Cardiac pacer in stable position.   Electronically Signed  By: Marcello Moores Register  On: 03/01/2015 09:56   Impression:  Overall I think she is doing well. I encouraged her to continue walking. She is planning to participate in cardiac rehab. I told her that she could drive her car but should not lift anything heavier than 10 lbs for three months postop.    Plan:  She will continue to follow up with Dr. Laqueta Due, Dr. Percival Spanish and Dr. Lovena Le. She will return to see me if she has any problems with her incisions.

## 2015-03-03 ENCOUNTER — Ambulatory Visit (INDEPENDENT_AMBULATORY_CARE_PROVIDER_SITE_OTHER): Payer: Medicare Other | Admitting: Cardiology

## 2015-03-03 VITALS — BP 114/62 | HR 100 | Ht 61.0 in | Wt 109.0 lb

## 2015-03-03 DIAGNOSIS — I350 Nonrheumatic aortic (valve) stenosis: Secondary | ICD-10-CM

## 2015-03-03 MED ORDER — CARVEDILOL 6.25 MG PO TABS: 6.2500 mg | ORAL_TABLET | Freq: Two times a day (BID) | ORAL | Status: DC

## 2015-03-03 NOTE — Patient Instructions (Signed)
Your physician recommends that you schedule a follow-up appointment in: 6 weeks with Dr. Percival Spanish  We have increased your carvedilol to 6.25 mg two times a day

## 2015-03-03 NOTE — Progress Notes (Signed)
HPI The patient presents for followup of aortic stenosis and reduced EF status post AVR.  She is now status post Bentall with bioprosthetic valve. 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   She has had no acute cardiac problems. He seems to be actually recovering physically well from her procedures. She has some discomfort where her ICD is. She's not noticing any palpitations, presyncope or syncope. She's not having any substernal chest pressure, neck or arm discomfort. She does have some chronic dyspnea but is not describing any PND or orthopnea. She doesn't notice any fevers or chills. She's had no lower extremity swelling. She says she is somewhat depressed and anxious.she is still quite limited by her back pain.  Allergies  Allergen Reactions  . Oxycodone Nausea Only    Current Outpatient Prescriptions  Medication Sig Dispense Refill  . albuterol (PROVENTIL HFA;VENTOLIN HFA) 108 (90 BASE) MCG/ACT inhaler Inhale 1-2 puffs into the lungs every 6 (six) hours as needed for wheezing or shortness of breath.    [name] albuterol (PROVENTIL) (2.5 MG/3ML) 0.083% nebulizer solution Take 2.5 mg by nebulization 2 (two) times daily as needed for wheezing.    . AMBULATORY NON FORMULARY MEDICATION Topical nasal rinse/ Tobram Budesonide Use nasal rinse daily as needed for dryness    . aspirin EC 325 MG EC tablet Take 1 tablet (325 mg total) by mouth daily. 30 tablet 0  . B Complex Vitamins (VITAMIN B COMPLEX PO) Take 1 tablet by mouth daily. ON HOLD UNTIL 02/20/15    . bisacodyl (DULCOLAX) 5 MG EC tablet Take 5 mg by mouth daily as needed for moderate constipation.    [name] buPROPion (WELLBUTRIN XL) 300 MG 24 hr tablet Take 300 mg by mouth daily.    . Calcium Carbonate-Vitamin D (CALCIUM-D PO) Take 1 tablet by mouth daily.     . carvedilol (COREG) 3.125 MG tablet Take 1 tablet (3.125 mg total) by mouth 2 (two) times daily with a meal. 60 tablet 1  . cyanocobalamin (,VITAMIN B-12,) 1000 MCG/ML  injection Inject 1,000 mcg into the muscle once a week. ON HOLD UNTIL 02/20/15    . diazepam (VALIUM) 10 MG tablet Take 10 mg by mouth 3 (three) times daily as needed for anxiety.    . ferrous [id number] C-folic acid (TRINSICON / FOLTRIN) capsule Take 1 capsule by mouth every morning. For one month then stop. 30 capsule 0  . fluticasone (FLONASE) 50 MCG/ACT nasal spray Place 1 spray into the nose 2 (two) times daily as needed for allergies or rhinitis.     [name] HYDROcodone-acetaminophen (NORCO/VICODIN) 5-325 MG per tablet Take 0.5 tablets by mouth every 4 (four) hours as needed for moderate pain. 40 tablet 0  . loratadine (CLARITIN) 10 MG tablet Take 10 mg by mouth daily.    [name] losartan (COZAAR) 25 MG tablet Take 1 tablet (25 mg total) by mouth daily. 30 tablet 1  . meloxicam (MOBIC) 15 MG tablet Take 15 mg by mouth daily as needed for pain.    . Menthol, Topical Analgesic, (ICY HOT EX) Apply 1 patch topically daily.    . montelukast (SINGULAIR) 10 MG tablet Take 10 mg by mouth at bedtime.    . Multiple Vitamin (MULTIVITAMIN) capsule Take 1 capsule by mouth daily.    . nicotine (NICODERM CQ - DOSED IN MG/24 HOURS) 21 mg/24hr patch Place 1 patch (21 mg total) onto the skin daily. 28 patch 0  . Olopatadine HCl (PATANASE) 0.6 % SOLN Place  2 drops into both nostrils 2 (two) times daily.     [name] omeprazole (PRILOSEC) 20 MG capsule Take 40 mg by mouth daily.    . polyethylene glycol (MIRALAX / GLYCOLAX) packet Take 17 g by mouth daily as needed for mild constipation.     . Polyvinyl Alcohol-Povidone (REFRESH OP) Place 2 drops into both eyes 2 (two) times daily.    . Probiotic Product (PROBIOTIC DAILY PO) Take 1 capsule by mouth daily. Ultimate flora    . pseudoephedrine-guaifenesin (MUCINEX D) 60-600 MG per tablet Take 1 tablet by mouth daily as needed for congestion.     . ranitidine (ZANTAC) 300 MG capsule Take 300 mg by mouth every evening.    . Saline (SIMPLY SALINE) 0.9 % AERS Place 2 sprays  into both nostrils 2 (two) times daily.    . traZODone (DESYREL) 50 MG tablet Take 50-75 mg by mouth at bedtime.      No current facility-administered medications for this visit.    Past Medical History  Diagnosis Date  . 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 Osteopenia   . HSV-1 (herpes simplex virus 1) infection   . HSV-2 (herpes simplex virus 2) infection   . Bicuspid aortic valve     CONGENITAL  . Aortic aneurysm     CARDIOLOGIST IS DR. Bettina Gavia IN [place]  . Adrenal tumor   . Degenerative disc disease     CERVICAL AND LUMBAR (DR. Lorin Mercy)  . Smoker   . Anxiety   . Chronic sinusitis   . Chronic depression   . Osteoarthritis   . Pernicious anemia   . Insomnia   . Asthma   . Pericardial cyst   . Chronic fatigue   . Vitamin B 12 deficiency   . Plantar fasciitis   . Hammer toe of right foot   . Hepatomegaly   . Rosacea   . Esophagitis   . Gastritis   . PONV (postoperative nausea and vomiting)   . Shortness of breath dyspnea   . COPD (chronic obstructive pulmonary disease)   . GERD (gastroesophageal reflux disease)   . Carpal tunnel syndrome, bilateral   . Cancer     skin    Past Surgical History  Procedure Laterality Date  . Excision of vulvar cis    . Sinus procedure  2009    x 6  . Excision of basal cell ca      SKIN   . Cataract extraction Bilateral     X2  . Carpal tunnel release Bilateral 2008  . Bunionectomy with hammertoe reconstruction Right   . Colonoscopy    . Upper gastrointestinal endoscopy    . Tee without cardioversion N/A 10/06/2014    Procedure: TRANSESOPHAGEAL ECHOCARDIOGRAM (TEE);  Surgeon: Sueanne Margarita, MD;  Location: [facility] ENDOSCOPY;  Service: Cardiovascular;  Laterality: N/A;  . Left and right heart catheterization with coronary angiogram N/A 09/23/2014    Procedure: LEFT AND RIGHT HEART CATHETERIZATION WITH CORONARY ANGIOGRAM;  Surgeon: Minus Breeding, MD;  Location: [facility] CATH LAB;  Service: Cardiovascular;  Laterality: N/A;  .  Aortic valve replacement N/A 01/12/2015    Procedure: AORTIC VALVE REPLACEMENT (AVR);  Surgeon: Gaye Pollack, MD;  Location: [place];  Service: Open Heart Surgery;  Laterality: N/A;  . Ascending aortic root replacement N/A 01/12/2015    Procedure: ASCENDING AORTIC ROOT REPLACEMENT;  Surgeon: Gaye Pollack, MD;  Location: [place];  Service: Open Heart Surgery;  Laterality: N/A;  .  Tee without cardioversion N/A 01/12/2015    Procedure: TRANSESOPHAGEAL ECHOCARDIOGRAM (TEE);  Surgeon: Gaye Pollack, MD;  Location: [place];  Service: Open Heart Surgery;  Laterality: N/A;  . Bi-ventricular pacemaker insertion N/A 01/18/2015    Procedure: BI-VENTRICULAR PACEMAKER INSERTION (CRT-P);  Surgeon: Evans Lance, MD;  Location: [facility] C[facility] LAB;  Service: Cardiovascular;  Laterality: N/A;    ROS:  As stated in the HPI and negative for all other systems.  PHYSICAL EXAM BP 114/62 mmHg  Pulse 100  Ht 5\' 1"  (1.549 m)  Wt 109 lb (49.442 kg)  BMI 20.61 kg/m2 GENERAL:  Frail appearing and looking younger than her stated age. HEENT:  Pupils equal round and reactive, fundi not visualized, oral mucosa unremarkable NECK:  No jugular venous distention, waveform within normal limits, carotid upstroke brisk and symmetric, no bruits, no thyromegaly LYMPHATICS:  No cervical, inguinal adenopathy LUNGS:  Clear to auscultation bilaterally BACK:  No CVA tenderness CHEST:  Unremarkable HEART:  PMI not displaced or sustained,S1 and S2 within normal limits, no S3, no S4, no clicks, no rubs, 3/6 apical systolic murmur also at the right upper sternal border, early peaking, no diastolic murmurs ABD:  Flat, positive bowel sounds normal in frequency in pitch, no bruits, no rebound, no guarding, no midline pulsatile mass, no hepatomegaly, no splenomegaly EXT:  Mildly diminished bilateral dorsalis pedis and posterior tibialis plus pulses throughout, no edema, no cyanosis no clubbing SKIN:  No rashes no nodules  EKG:  Sinus rhythm with  ventricular pacing, occasional dropped sinus beats.  03/03/2015  ASSESSMENT AND PLAN  AVR:  The patient's now status post valve replacement and Bentall. She's done well physically this she seems to be distressed more mentally.  She did have follow-up echocardiography with a stable valve. We will follow this clinically.  DEPRESSION:  I have asked her to talk to [facility], MD about this.  TOBACCO ABUSE: she still smoking and we again discussed the need to stop.  CARDIOMYOPATHY:  I am going to try to titrate her Coreg to 6.25 mg twice daily.

## 2015-03-06 ENCOUNTER — Encounter: Payer: Self-pay | Admitting: Cardiology

## 2015-03-07 NOTE — Addendum Note (Signed)
Addended by: Vear Clock on: 03/07/2015 04:22 PM   Modules accepted: Orders

## 2015-03-09 ENCOUNTER — Telehealth: Payer: Self-pay

## 2015-03-10 ENCOUNTER — Other Ambulatory Visit: Payer: Self-pay

## 2015-03-10 DIAGNOSIS — G8918 Other acute postprocedural pain: Secondary | ICD-10-CM

## 2015-03-10 MED ORDER — HYDROCODONE-ACETAMINOPHEN 5-325 MG PO TABS: 1.0000 | ORAL_TABLET | Freq: Four times a day (QID) | ORAL | Status: DC | PRN

## 2015-03-10 NOTE — Telephone Encounter (Signed)
RX for hydrocodone 5/325 mg refilled

## 2015-03-11 NOTE — Telephone Encounter (Signed)
OK to refill for one month only.  Thank you.

## 2015-03-13 ENCOUNTER — Other Ambulatory Visit: Payer: Self-pay

## 2015-03-13 DIAGNOSIS — G8918 Other acute postprocedural pain: Secondary | ICD-10-CM

## 2015-03-13 MED ORDER — HYDROCODONE-ACETAMINOPHEN 5-325 MG PO TABS: 1.0000 | ORAL_TABLET | Freq: Four times a day (QID) | ORAL | Status: DC | PRN

## 2015-03-14 ENCOUNTER — Telehealth: Payer: Self-pay | Admitting: Internal Medicine

## 2015-03-14 ENCOUNTER — Telehealth: Payer: Self-pay | Admitting: Cardiology

## 2015-03-14 NOTE — Telephone Encounter (Signed)
Called and left a message with Almyra Free

## 2015-03-14 NOTE — Telephone Encounter (Signed)
Returned call to [place] with [employer].02/10/15 echo revealed EF 45 to 50 %.She stated patient has enrolled in their heart failure program.Stated she will be monitoring patient and will call Dr.Hochrein as needed.

## 2015-03-14 NOTE — Telephone Encounter (Signed)
ok 

## 2015-03-14 NOTE — Telephone Encounter (Signed)
New problem   Need to confirm information for a heart failure diagnoses and ejection fraction. Please advise.

## 2015-03-14 NOTE — Telephone Encounter (Signed)
Almyra Free called in wanting to let Dr. Percival Spanish know that she has enrolled the pt in to the heart failure program and would like for the doctor to send an ejection fraction. Please call back  Thanks

## 2015-03-16 NOTE — Telephone Encounter (Signed)
I would like to see the patient. She should not still be hurting after device implant. GT

## 2015-03-20 ENCOUNTER — Telehealth: Payer: Self-pay | Admitting: Cardiology

## 2015-03-20 DIAGNOSIS — Z79899 Other long term (current) drug therapy: Secondary | ICD-10-CM

## 2015-03-20 MED ORDER — LOSARTAN POTASSIUM 25 MG PO TABS: 25.0000 mg | ORAL_TABLET | Freq: Every day | ORAL | Status: DC

## 2015-03-20 NOTE — Telephone Encounter (Signed)
Patient called regarding losartan potassium (cozaar). She states she thinks she has been off this medication since 2/22 (when she stopped her iron supplement). She states she was previously on furosemide and potassium but was told to stop these medications (not sure who instructed her on this) and she instead stopped losartan.   Patient c/o not having help and feeling weak. She complains of feeling helpless and that her husband is getting ready to have knee surgery and will need to go to rehab. She states she has not yet started cardiac rehab herself.   She states she had an old BP cuff and cannot get to pharmacy to check BP.   Patient also states that she was not on losartan at last OV with Dr. Percival Spanish, despite this medication being on list.   Will defer to Dr. Percival Spanish to advise.   Losartan refill sent to pharmacy. Patient will hold off on taking this until she hears back from [place] - her BP was 114/62 at last OV

## 2015-03-20 NOTE — Telephone Encounter (Signed)
Please call,question about Cozaar.

## 2015-03-21 NOTE — Telephone Encounter (Signed)
Patient notified to hold losartan. She will had labs at [facility] when her husband goes in for hip replacement on 3/30. She has transportation issues and cannot make it to a lab earlier.   Labs ordered and lab slips mailed to patient.

## 2015-03-21 NOTE — Telephone Encounter (Signed)
I did not have her taking Lasix or potassium.  She should hold this.  She should hold her Cozaar since she was not taking it.  Please order a BMET and CBC.

## 2015-03-22 ENCOUNTER — Telehealth: Payer: Self-pay | Admitting: Cardiology

## 2015-03-22 NOTE — Telephone Encounter (Signed)
Pt called in wanting to know if she can take Magnesium Nitrate to help with the constipation from the medications she is taking. Please call  Thanks

## 2015-03-22 NOTE — Telephone Encounter (Signed)
Pt referring to mag citrate for laxative use.   Will route to [place] to advise on potential med interactions.

## 2015-03-23 ENCOUNTER — Telehealth: Payer: Self-pay | Admitting: Internal Medicine

## 2015-03-23 ENCOUNTER — Ambulatory Visit (INDEPENDENT_AMBULATORY_CARE_PROVIDER_SITE_OTHER): Payer: Medicare Other | Admitting: *Deleted

## 2015-03-23 ENCOUNTER — Encounter: Payer: Self-pay | Admitting: *Deleted

## 2015-03-23 DIAGNOSIS — I5022 Chronic systolic (congestive) heart failure: Secondary | ICD-10-CM | POA: Diagnosis not present

## 2015-03-23 DIAGNOSIS — Z95810 Presence of automatic (implantable) cardiac defibrillator: Secondary | ICD-10-CM | POA: Diagnosis not present

## 2015-03-23 NOTE — Telephone Encounter (Signed)
Pt called back.   Advised pt med ok per pharmacist. She voiced understanding.

## 2015-03-23 NOTE — Telephone Encounter (Signed)
No answer when returned call.

## 2015-03-23 NOTE — Telephone Encounter (Signed)
New message         Pt would like to know if you received her transmission

## 2015-03-23 NOTE — Telephone Encounter (Signed)
Ok to use magnesium citrate x 1 bottle to help with constipation.

## 2015-03-23 NOTE — Telephone Encounter (Signed)
Informed pt that transmission was received.  

## 2015-03-23 NOTE — Progress Notes (Signed)
EPIC Encounter for ICM Monitoring  Patient Name: Carol Warren is a 69 y.o. female Date: 03/23/2015 Primary Care Physican: Ernestene Kiel, MD Primary Cardiologist: [place] Electrophysiologist: Lovena Le Dry Weight: 104 lbs   Bi-V pacing: 99.9%      In the past month, have you:  1. Gained more than 2 pounds in a day or more than 5 pounds in a week? no  2. Had changes in your medications (with verification of current medications)? no  3. Had more shortness of breath than is usual for you? no  4. Limited your activity because of shortness of breath? no  5. Not been able to sleep because of shortness of breath? no  6. Had increased swelling in your feet or ankles? no  7. Had symptoms of dehydration (dizziness, dry mouth, increased thirst, decreased urine output) no  8. Had changes in sodium restriction? no  9. Been compliant with medication? Yes   ICM trend:   Follow-up plan: ICM clinic phone appointment: 05/22/15. The patient is s/p AV and root replacement on 01/12/15 with Dr. Cyndia Bent and s/p CRT-D implant on 01/18/15 with Dr. Lovena Le. She reports feeling fatigue since the implant of her device, more so than prior. Her activity level has been decreased by her fatigue and chronic back problems. She recently saw Dr. Louanne Skye, who has recommended insertion an electronic stimulator to her spine. She is due to follow up 4/18 with Dr. Percival Spanish and 4/22 with Dr. Lovena Le.   Copy of note sent to patient's primary care physician, primary cardiologist, and device following physician.  Alvis Lemmings, RN, BSN 03/23/2015 3:16 PM

## 2015-03-27 ENCOUNTER — Telehealth: Payer: Self-pay | Admitting: Cardiology

## 2015-03-27 NOTE — Telephone Encounter (Signed)
New Message       Pt calling stating that she was supposed to receive the orders for her lab work so she could get it done at another office. Pt states that she was told to call back today if she had yet to receive it. Please call back and advise.

## 2015-03-27 NOTE — Telephone Encounter (Signed)
Lab slips faxed to [facility] for patient to have lab work on 3/30 Fax [phone] Phone [phone]  Patient notified to use outpatient lab - 730am-5pm M-F

## 2015-03-28 ENCOUNTER — Telehealth: Payer: Self-pay | Admitting: Cardiology

## 2015-03-28 NOTE — Telephone Encounter (Signed)
Received records from [place] for appointment with Dr Percival Spanish on 04/17/15.  Records given to [facility] (medical records) for Dr Hochrein's schedule on 04/17/15. lp

## 2015-04-06 ENCOUNTER — Telehealth: Payer: Self-pay | Admitting: Cardiology

## 2015-04-06 NOTE — Telephone Encounter (Signed)
Please call,she wants to know if she is suppose to be taking Losartan?

## 2015-04-06 NOTE — Telephone Encounter (Signed)
Pt found that she had missed stocking her losartan in her pillbox, doesn't remember last time she took it. Advised to resume, keep track of BPs - she voiced understanding on low/high BP parameters. Instructed to call for any other needs as well.

## 2015-04-17 ENCOUNTER — Ambulatory Visit (INDEPENDENT_AMBULATORY_CARE_PROVIDER_SITE_OTHER): Payer: Medicare Other | Admitting: Cardiology

## 2015-04-17 ENCOUNTER — Encounter: Payer: Self-pay | Admitting: Cardiology

## 2015-04-17 VITALS — BP 142/74 | HR 87 | Ht 61.0 in | Wt 109.5 lb

## 2015-04-17 DIAGNOSIS — I5022 Chronic systolic (congestive) heart failure: Secondary | ICD-10-CM | POA: Diagnosis not present

## 2015-04-17 DIAGNOSIS — Z952 Presence of prosthetic heart valve: Secondary | ICD-10-CM

## 2015-04-17 DIAGNOSIS — Z954 Presence of other heart-valve replacement: Secondary | ICD-10-CM | POA: Diagnosis not present

## 2015-04-17 MED ORDER — CARVEDILOL 6.25 MG PO TABS: 9.3750 mg | ORAL_TABLET | Freq: Two times a day (BID) | ORAL | Status: DC

## 2015-04-17 NOTE — Patient Instructions (Signed)
We will cancel your appointment with Dr.Taylor  INCREASE your Coreg to 9.375 mg daily (1.5 tablets) twice daily  Dr.Hochrein recommends that you schedule a follow-up appointment in: 6 weeks

## 2015-04-17 NOTE — Progress Notes (Signed)
HPI The patient presents for followup of aortic stenosis and reduced EF status post AVR.  She is now status post Bentall with bioprosthetic valve. 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   She is doing rehab at [place].  She's starting to have a little more energy. She's not having any new chest pressure, neck or arm discomfort. She's had no new palpitations, presyncope or syncope. She has no new shortness of breath, PND or orthopnea. She's having less chest discomfort unless ICD discomfort. She is unfortunately still smoking. She is limited by back pain and is going to have a spine stimulator   Allergies  Allergen Reactions  . Oxycodone Nausea Only    Current Outpatient Prescriptions  Medication Sig Dispense Refill  . albuterol (PROVENTIL HFA;VENTOLIN HFA) 108 (90 BASE) MCG/ACT inhaler Inhale 1-2 puffs into the lungs every 6 (six) hours as needed for wheezing or shortness of breath.    [name] albuterol (PROVENTIL) (2.5 MG/3ML) 0.083% nebulizer solution Take 2.5 mg by nebulization 2 (two) times daily as needed for wheezing.    . AMBULATORY NON FORMULARY MEDICATION Topical nasal rinse/ Tobram Budesonide Use nasal rinse daily as needed for dryness    . aspirin EC 325 MG EC tablet Take 1 tablet (325 mg total) by mouth daily. 30 tablet 0  . B Complex Vitamins (VITAMIN B COMPLEX PO) Take 1 tablet by mouth daily. ON HOLD UNTIL 02/20/15    . bisacodyl (DULCOLAX) 5 MG EC tablet Take 5 mg by mouth daily as needed for moderate constipation.    [name] buPROPion (WELLBUTRIN XL) 300 MG 24 hr tablet Take 300 mg by mouth daily.    . Calcium Carbonate-Vitamin D (CALCIUM-D PO) Take 1 tablet by mouth daily.     . carvedilol (COREG) 6.25 MG tablet Take 1 tablet (6.25 mg total) by mouth 2 (two) times daily with a meal. 60 tablet 6  . cyanocobalamin (,VITAMIN B-12,) 1000 MCG/ML injection Inject 1,000 mcg into the muscle once a week. ON HOLD UNTIL 02/20/15    . diazepam (VALIUM) 10 MG tablet Take 10 mg  by mouth 3 (three) times daily as needed for anxiety.    . fluticasone (FLONASE) 50 MCG/ACT nasal spray Place 1 spray into the nose 2 (two) times daily as needed for allergies or rhinitis.     [name] HYDROcodone-acetaminophen (NORCO/VICODIN) 5-325 MG per tablet Take 1 tablet by mouth every 6 (six) hours as needed for moderate pain or severe pain. 40 tablet 0  . loratadine (CLARITIN) 10 MG tablet Take 10 mg by mouth daily.    [name] losartan (COZAAR) 25 MG tablet Take 1 tablet (25 mg total) by mouth daily. 30 tablet 3  . meloxicam (MOBIC) 15 MG tablet Take 15 mg by mouth daily as needed for pain.    . Menthol, Topical Analgesic, (ICY HOT EX) Apply 1 patch topically daily.    . montelukast (SINGULAIR) 10 MG tablet Take 10 mg by mouth at bedtime.    . Multiple Vitamin (MULTIVITAMIN) capsule Take 1 capsule by mouth daily.    . nicotine (NICODERM CQ - DOSED IN MG/24 HOURS) 21 mg/24hr patch Place 1 patch (21 mg total) onto the skin daily. 28 patch 0  . Olopatadine HCl (PATANASE) 0.6 % SOLN Place 2 drops into both nostrils 2 (two) times daily.     [name] omeprazole (PRILOSEC) 20 MG capsule Take 40 mg by mouth daily.    . polyethylene glycol (MIRALAX / GLYCOLAX) packet Take 17 g  by mouth daily as needed for mild constipation.     . Polyvinyl Alcohol-Povidone (REFRESH OP) Place 2 drops into both eyes 2 (two) times daily.    . Probiotic Product (PROBIOTIC DAILY PO) Take 1 capsule by mouth daily. Ultimate flora    . pseudoephedrine-guaifenesin (MUCINEX D) 60-600 MG per tablet Take 1 tablet by mouth daily as needed for congestion.     . ranitidine (ZANTAC) 300 MG capsule Take 300 mg by mouth every evening.    . Saline (SIMPLY SALINE) 0.9 % AERS Place 2 sprays into both nostrils 2 (two) times daily.    . traZODone (DESYREL) 100 MG tablet Take 100 mg by mouth at bedtime.     No current facility-administered medications for this visit.    Past Medical History  Diagnosis Date  . CIS (carcinoma in situ) 04/1997    VULVAR    . Osteopenia   . HSV-1 (herpes simplex virus 1) infection   . HSV-2 (herpes simplex virus 2) infection   . Bicuspid aortic valve     CONGENITAL  . Aortic aneurysm     CARDIOLOGIST IS DR. Bettina Gavia IN [place]  . Adrenal tumor   . Degenerative disc disease     CERVICAL AND LUMBAR (DR. Lorin Mercy)  . Smoker   . Anxiety   . Chronic sinusitis   . Chronic depression   . Osteoarthritis   . Pernicious anemia   . Insomnia   . Asthma   . Pericardial cyst   . Chronic fatigue   . Vitamin B 12 deficiency   . Plantar fasciitis   . Hammer toe of right foot   . Hepatomegaly   . Rosacea   . Esophagitis   . Gastritis   . PONV (postoperative nausea and vomiting)   . Shortness of breath dyspnea   . COPD (chronic obstructive pulmonary disease)   . GERD (gastroesophageal reflux disease)   . Carpal tunnel syndrome, bilateral   . Cancer     skin    Past Surgical History  Procedure Laterality Date  . Excision of vulvar cis    . Sinus procedure  2009    x 6  . Excision of basal cell ca      SKIN   . Cataract extraction Bilateral     X2  . Carpal tunnel release Bilateral 2008  . Bunionectomy with hammertoe reconstruction Right   . Colonoscopy    . Upper gastrointestinal endoscopy    . Tee without cardioversion N/A 10/06/2014    Procedure: TRANSESOPHAGEAL ECHOCARDIOGRAM (TEE);  Surgeon: Sueanne Margarita, MD;  Location: [facility] ENDOSCOPY;  Service: Cardiovascular;  Laterality: N/A;  . Left and right heart catheterization with coronary angiogram N/A 09/23/2014    Procedure: LEFT AND RIGHT HEART CATHETERIZATION WITH CORONARY ANGIOGRAM;  Surgeon: Minus Breeding, MD;  Location: [facility] CATH LAB;  Service: Cardiovascular;  Laterality: N/A;  . Aortic valve replacement N/A 01/12/2015    Procedure: AORTIC VALVE REPLACEMENT (AVR);  Surgeon: Gaye Pollack, MD;  Location: [place];  Service: Open Heart Surgery;  Laterality: N/A;  . Ascending aortic root replacement N/A 01/12/2015    Procedure: ASCENDING AORTIC ROOT  REPLACEMENT;  Surgeon: Gaye Pollack, MD;  Location: [place];  Service: Open Heart Surgery;  Laterality: N/A;  . Tee without cardioversion N/A 01/12/2015    Procedure: TRANSESOPHAGEAL ECHOCARDIOGRAM (TEE);  Surgeon: Gaye Pollack, MD;  Location: [place];  Service: Open Heart Surgery;  Laterality: N/A;  . Bi-ventricular pacemaker insertion N/A 01/18/2015  Procedure: BI-VENTRICULAR PACEMAKER INSERTION (CRT-P);  Surgeon: Evans Lance, MD;  Location: [facility] CATH LAB;  Service: Cardiovascular;  Laterality: N/A;    ROS:  As stated in the HPI and negative for all other systems.  PHYSICAL EXAM BP 142/74 mmHg  Pulse 87  Ht 5\' 1"  (1.549 m)  Wt 109 lb 8 oz (49.669 kg)  BMI 20.70 kg/m2 GENERAL:  Frail appearing and looking older than her stated age. NECK:  No jugular venous distention, waveform within normal limits, carotid upstroke brisk and symmetric, no bruits, no thyromegaly LYMPHATICS:  No cervical, inguinal adenopathy LUNGS:  Clear to auscultation bilaterally BACK:  No CVA tenderness CHEST:  Well healed sternotomy scar.   HEART:  PMI not displaced or sustained,S1 and S2 within normal limits, no S3, no S4, no clicks, no rubs, 2/6 apical systolic murmur also at the right upper sternal border, early peaking, no diastolic murmurs ABD:  Flat, positive bowel sounds normal in frequency in pitch, no bruits, no rebound, no guarding, no midline pulsatile mass, no hepatomegaly, no splenomegaly EXT:  Mildly diminished bilateral dorsalis pedis and posterior tibialis plus pulses throughout, no edema, no cyanosis no clubbing SKIN:  No rashes no nodules  EKG:  Sinus rhythm with ventricular pacing, rate 87.  04/17/2015  ASSESSMENT AND PLAN  AVR:  The patient's now status post valve replacement and Bentall.   She did have follow-up echocardiography with a stable valve. We will follow this clinically.  DEPRESSION:  I have asked her to talk to [facility], MD about this.  TOBACCO ABUSE: She still smoking  and we again discussed the need to stop.  CARDIOMYOPATHY:  I am going to try to titrate her Coreg to 9.375 mg twice daily.  BACK PAIN:  The patient would be at acceptable risk for this procedure include stop her aspirin as necessary prior to the procedure.

## 2015-04-21 ENCOUNTER — Encounter: Payer: Medicare Other | Admitting: Internal Medicine

## 2015-05-09 ENCOUNTER — Encounter: Payer: Medicare Other | Admitting: Internal Medicine

## 2015-05-18 ENCOUNTER — Encounter: Payer: Medicare Other | Admitting: Internal Medicine

## 2015-05-22 ENCOUNTER — Ambulatory Visit (INDEPENDENT_AMBULATORY_CARE_PROVIDER_SITE_OTHER): Payer: Medicare Other | Admitting: *Deleted

## 2015-05-22 ENCOUNTER — Encounter: Payer: Self-pay | Admitting: *Deleted

## 2015-05-22 DIAGNOSIS — Z95810 Presence of automatic (implantable) cardiac defibrillator: Secondary | ICD-10-CM | POA: Diagnosis not present

## 2015-05-22 NOTE — Progress Notes (Signed)
EPIC Encounter for ICM Monitoring  Patient Name: Carol Warren is a 69 y.o. female Date: 05/22/2015 Primary Care Physican: Ernestene Kiel, MD Primary Cardiologist: Bayard Electrophysiologist: Lovena Le Dry Weight: 104 lbs       In the past month, have you:  1. Gained more than 2 pounds in a day or more than 5 pounds in a week? no  2. Had changes in your medications (with verification of current medications)? Yes. She is dealing with an URI and is currently on a 14- day course of augmentin.  3. Had more shortness of breath than is usual for you? no  4. Limited your activity because of shortness of breath? no  5. Not been able to sleep because of shortness of breath? no  6. Had increased swelling in your feet or ankles? no  7. Had symptoms of dehydration (dizziness, dry mouth, increased thirst, decreased urine output) no  8. Had changes in sodium restriction? no  9. Been compliant with medication? Yes   ICM trend:   Follow-up plan: ICM clinic phone appointment: 06/22/15. The patient is doing well today aside from fatigue, which is not new. She is attending cardiac rehab.   Copy of note sent to patient's primary care physician, primary cardiologist, and device following physician.  Alvis Lemmings, RN, BSN 05/22/2015 4:01 PM

## 2015-06-01 ENCOUNTER — Telehealth: Payer: Self-pay | Admitting: Internal Medicine

## 2015-06-01 NOTE — Telephone Encounter (Signed)
Called patient left message to return phone call in regards to her symptoms

## 2015-06-01 NOTE — Telephone Encounter (Signed)
I spoke with the patient today. She did transmit and her optivol readings are essentially stable. She is weighing at home. Her baseline weight is 104 lbs. She has not gone up more than a pound in 24 hours. The highest her weight has been over the last month is 107 lbs (on 5/9 & 5/21). She is barely eating a full meal a day. She has been dealing with a sinus infection and has been treated with an antibiotic. She reports some depression issues as well. She does go to cardiac rehab, but other than that she does not have the energy to be very active per her report. I have advised her that the slight elevation in her optivol readings may be due to her sinus congestion. She is due to follow up with Dr. Percival Spanish on 06/12/15. She will call back if her weight goes up 2-3 lbs in 24 hours or 5 lbs in 1 week.

## 2015-06-01 NOTE — Telephone Encounter (Signed)
Patient called back and has been on Augmentin 875/'125mg'$  bid for sinus infection since prescribed on 05/19/15 by Dr Laqueta Due in [place] at [facility].  She has a follow up tomorrow wit her.  She has been going to cardiac rehab and thought at first her symptoms were related lifting weights but now when she takes a deep breath in she says she feels her heart pounding.  It only does this with deep inspiration.  She has an appointment today at 11:30 and will send and ICM transmission upon her return.  If her fluid levels are stable her symptoms are most likely related to her sinusitis.

## 2015-06-01 NOTE — Telephone Encounter (Signed)
I spoke with the patient. See previous phone encounter opened earlier today.

## 2015-06-01 NOTE — Telephone Encounter (Signed)
Follow Up    Pt is calling following up on assistance with transmission this afternoon.

## 2015-06-01 NOTE — Telephone Encounter (Signed)
New message    Patient calling ,   have a sinu infection.    Went to cardiac rehab on yesterday.    S/P inserted pacemaker & device Jan 14.   Pt c/o of Chest Pain: STAT if CP now or developed within 24 hours  1. Are you having CP right now? Tightness,  Discomfort   2. Are you experiencing any other symptoms (ex. SOB, nausea, vomiting, sweating)?  No   3. How long have you been experiencing CP? Yesterday   4. Is your CP continuous or coming and going?  Coming and going if she breathe that when she feel it.    5. Have you taken Nitroglycerin? No  ?

## 2015-06-08 ENCOUNTER — Telehealth: Payer: Self-pay | Admitting: Cardiology

## 2015-06-08 NOTE — Telephone Encounter (Signed)
Mitzi Hansen instructed to send Dr. Percival Spanish a staff message

## 2015-06-08 NOTE — Telephone Encounter (Signed)
Mitzi Hansen wanted to speak with Dr. Rosezella [place] nurse

## 2015-06-12 ENCOUNTER — Encounter: Payer: Self-pay | Admitting: Internal Medicine

## 2015-06-12 ENCOUNTER — Ambulatory Visit (INDEPENDENT_AMBULATORY_CARE_PROVIDER_SITE_OTHER): Payer: Medicare Other | Admitting: Cardiology

## 2015-06-12 ENCOUNTER — Encounter: Payer: Self-pay | Admitting: Cardiology

## 2015-06-12 ENCOUNTER — Ambulatory Visit (INDEPENDENT_AMBULATORY_CARE_PROVIDER_SITE_OTHER): Payer: Medicare Other | Admitting: *Deleted

## 2015-06-12 VITALS — BP 146/84 | HR 108 | Ht 61.0 in | Wt 108.0 lb

## 2015-06-12 DIAGNOSIS — I429 Cardiomyopathy, unspecified: Secondary | ICD-10-CM

## 2015-06-12 DIAGNOSIS — R42 Dizziness and giddiness: Secondary | ICD-10-CM | POA: Diagnosis not present

## 2015-06-12 DIAGNOSIS — I5022 Chronic systolic (congestive) heart failure: Secondary | ICD-10-CM

## 2015-06-12 DIAGNOSIS — I428 Other cardiomyopathies: Secondary | ICD-10-CM

## 2015-06-12 DIAGNOSIS — I350 Nonrheumatic aortic (valve) stenosis: Secondary | ICD-10-CM | POA: Diagnosis not present

## 2015-06-12 DIAGNOSIS — I442 Atrioventricular block, complete: Secondary | ICD-10-CM

## 2015-06-12 MED ORDER — CARVEDILOL 12.5 MG PO TABS: 12.5000 mg | ORAL_TABLET | Freq: Two times a day (BID) | ORAL | Status: DC

## 2015-06-12 NOTE — Addendum Note (Signed)
Addended by: Vennie Homans on: 06/12/2015 12:25 PM   Modules accepted: Orders, Medications

## 2015-06-12 NOTE — Progress Notes (Signed)
HPI The patient presents for followup of aortic stenosis and reduced EF status post AVR.  She is now status post Bentall with bioprosthetic valve. 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   She is doing rehab at [place].  I got a message recently during rehabilitation she had some dizziness while on the stair step machine. I reviewed some strips and she seemed to be under sensing some P waves. Cardiologist on-call at [facility] suggested that these P waves might be occurring in the blanking period however, I don't think there are any changes to her device at that time.  She reports that she will get tired suddenly when she is exercising. She otherwise loves cardiac rehabilitation.  She is not having any new shortness of breath, PND or orthopnea. She's not having any chest pressure, neck or arm discomfort. She's had no weight gain or edema. She's working with therapy for her depression doing better with this. She still has back pain and has yet to get stimulator.  Allergies  Allergen Reactions  . Oxycodone Nausea Only    Current Outpatient Prescriptions  Medication Sig Dispense Refill  . albuterol (PROVENTIL HFA;VENTOLIN HFA) 108 (90 BASE) MCG/ACT inhaler Inhale 1-2 puffs into the lungs every 6 (six) hours as needed for wheezing or shortness of breath.    [name] albuterol (PROVENTIL) (2.5 MG/3ML) 0.083% nebulizer solution Take 2.5 mg by nebulization 2 (two) times daily as needed for wheezing.    . AMBULATORY NON FORMULARY MEDICATION Topical nasal rinse/ Tobram Budesonide Use nasal rinse daily as needed for dryness    . aspirin EC 325 MG EC tablet Take 1 tablet (325 mg total) by mouth daily. 30 tablet 0  . B Complex Vitamins (VITAMIN B COMPLEX PO) Take 1 tablet by mouth daily. ON HOLD UNTIL 02/20/15    . bisacodyl (DULCOLAX) 5 MG EC tablet Take 5 mg by mouth daily as needed for moderate constipation.    [name] buPROPion (WELLBUTRIN XL) 300 MG 24 hr tablet Take 300 mg by mouth daily.    .  Calcium Carbonate-Vitamin D (CALCIUM-D PO) Take 1 tablet by mouth daily.     . carvedilol (COREG) 6.25 MG tablet Take 1.5 tablets (9.375 mg total) by mouth 2 (two) times daily. 270 tablet 3  . cefdinir (OMNICEF) 300 MG capsule Take 300 mg by mouth 2 (two) times daily.    . cyanocobalamin (,VITAMIN B-12,) 1000 MCG/ML injection Inject 1,000 mcg into the muscle once a week. ON HOLD UNTIL 02/20/15    . diazepam (VALIUM) 10 MG tablet Take 10 mg by mouth 3 (three) times daily as needed for anxiety.    . fluticasone (FLONASE) 50 MCG/ACT nasal spray Place 1 spray into the nose 2 (two) times daily as needed for allergies or rhinitis.     [name] HYDROcodone-acetaminophen (NORCO/VICODIN) 5-325 MG per tablet Take 1 tablet by mouth every 6 (six) hours as needed for moderate pain or severe pain. 40 tablet 0  . loratadine (CLARITIN) 10 MG tablet Take 10 mg by mouth daily.    [name] losartan (COZAAR) 25 MG tablet Take 1 tablet (25 mg total) by mouth daily. 30 tablet 3  . meloxicam (MOBIC) 15 MG tablet Take 15 mg by mouth daily as needed for pain.    . Menthol, Topical Analgesic, (ICY HOT EX) Apply 1 patch topically daily.    . montelukast (SINGULAIR) 10 MG tablet Take 10 mg by mouth at bedtime.    . Multiple Vitamin (MULTIVITAMIN) capsule  Take 1 capsule by mouth daily.    . nicotine (NICODERM CQ - DOSED IN MG/24 HOURS) 21 mg/24hr patch Place 1 patch (21 mg total) onto the skin daily. 28 patch 0  . Olopatadine HCl (PATANASE) 0.6 % SOLN Place 2 drops into both nostrils 2 (two) times daily.     [name] omeprazole (PRILOSEC) 20 MG capsule Take 40 mg by mouth daily.    . polyethylene glycol (MIRALAX / GLYCOLAX) packet Take 17 g by mouth daily as needed for mild constipation.     . Polyvinyl Alcohol-Povidone (REFRESH OP) Place 2 drops into both eyes 2 (two) times daily.    . Probiotic Product (PROBIOTIC DAILY PO) Take 1 capsule by mouth daily. Ultimate flora    . pseudoephedrine-guaifenesin (MUCINEX D) 60-600 MG per tablet Take 1  tablet by mouth daily as needed for congestion.     . QUEtiapine (SEROQUEL) 25 MG tablet Take 25 mg by mouth at bedtime.    . ranitidine (ZANTAC) 300 MG capsule Take 300 mg by mouth every evening.    . Saline (SIMPLY SALINE) 0.9 % AERS Place 2 sprays into both nostrils 2 (two) times daily.     No current facility-administered medications for this visit.    Past Medical History  Diagnosis Date  . 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 Osteopenia   . HSV-1 (herpes simplex virus 1) infection   . HSV-2 (herpes simplex virus 2) infection   . Bicuspid aortic valve     CONGENITAL  . Aortic aneurysm   . Adrenal tumor   . Degenerative disc disease     CERVICAL AND LUMBAR (DR. Lorin Mercy)  . Smoker   . Anxiety   . Chronic sinusitis   . Chronic depression   . Osteoarthritis   . Pernicious anemia   . Insomnia   . Asthma   . Chronic fatigue   . Vitamin B 12 deficiency   . Plantar fasciitis   . Hammer toe of right foot   . Hepatomegaly   . PONV (postoperative nausea and vomiting)   . Shortness of breath dyspnea   . COPD (chronic obstructive pulmonary disease)   . GERD (gastroesophageal reflux disease)   . Carpal tunnel syndrome, bilateral     Past Surgical History  Procedure Laterality Date  . Excision of vulvar cis    . Sinus procedure  2009    x 6  . Excision of basal cell ca      SKIN   . Cataract extraction Bilateral     X2  . Carpal tunnel release Bilateral 2008  . Bunionectomy with hammertoe reconstruction Right   . Colonoscopy    . Upper gastrointestinal endoscopy    . Tee without cardioversion N/A 10/06/2014    Procedure: TRANSESOPHAGEAL ECHOCARDIOGRAM (TEE);  Surgeon: Sueanne Margarita, MD;  Location: [facility] ENDOSCOPY;  Service: Cardiovascular;  Laterality: N/A;  . Left and right heart catheterization with coronary angiogram N/A 09/23/2014    Procedure: LEFT AND RIGHT HEART CATHETERIZATION WITH CORONARY ANGIOGRAM;  Surgeon: Minus Breeding, MD;  Location: [facility] CATH LAB;   Service: Cardiovascular;  Laterality: N/A;  . Aortic valve replacement N/A 01/12/2015    Procedure: AORTIC VALVE REPLACEMENT (AVR);  Surgeon: Gaye Pollack, MD;  Location: [place];  Service: Open Heart Surgery;  Laterality: N/A;  . Ascending aortic root replacement N/A 01/12/2015    Procedure: ASCENDING AORTIC ROOT REPLACEMENT;  Surgeon: Gaye Pollack, MD;  Location: [place];  Service:  Open Heart Surgery;  Laterality: N/A;  . Tee without cardioversion N/A 01/12/2015    Procedure: TRANSESOPHAGEAL ECHOCARDIOGRAM (TEE);  Surgeon: Gaye Pollack, MD;  Location: [place];  Service: Open Heart Surgery;  Laterality: N/A;  . Bi-ventricular pacemaker insertion N/A 01/18/2015    Procedure: BI-VENTRICULAR PACEMAKER INSERTION (CRT-P);  Surgeon: Evans Lance, MD;  Location: [facility] CATH LAB;  Service: Cardiovascular;  Laterality: N/A;    ROS:  As stated in the HPI and negative for all other systems.  PHYSICAL EXAM BP 146/84 mmHg  Pulse 108  Ht '5\' 1"'$  (1.549 m)  Wt 108 lb (48.988 kg)  BMI 20.42 kg/m2 GENERAL:  Frail appearing and looking older than her stated age. NECK:  No jugular venous distention, waveform within normal limits, carotid upstroke brisk and symmetric, no bruits, no thyromegaly LYMPHATICS:  No cervical, inguinal adenopathy LUNGS:  Clear to auscultation bilaterally BACK:  No CVA tenderness CHEST:  Well healed sternotomy scar.   HEART:  PMI not displaced or sustained,S1 and S2 within normal limits, no S3, no S4, no clicks, no rubs, 2/6 apical systolic murmur also at the right upper sternal border, early peaking, no diastolic murmurs ABD:  Flat, positive bowel sounds normal in frequency in pitch, no bruits, no rebound, no guarding, no midline pulsatile mass, no hepatomegaly, no splenomegaly EXT:  Mildly diminished bilateral dorsalis pedis and posterior tibialis plus pulses throughout, no edema, no cyanosis no clubbing SKIN:  No rashes no nodules   ASSESSMENT AND PLAN  AVR:  The patient's now  status post valve replacement and Bentall.   She did have follow-up echocardiography with a stable valve. We will follow this clinically.  DEPRESSION:  This is now being addressed.    TOBACCO ABUSE: She still smoking and we again discussed the need to stop.  CARDIOMYOPATHY:  I am going to try to titrate her Coreg to 9.12.5 mg twice daily.  BACK PAIN:  The patient would be at acceptable risk for this procedure include stop her aspirin as necessary prior to the procedure.  PACEMAKER:  We did have her device interrogated. We will send her over to the device clinic to get reprogramming so that she does not have a sudden drop in her heart rate with activities.

## 2015-06-12 NOTE — Patient Instructions (Addendum)
Your physician wants you to follow-up in: 6 Months You will receive a reminder letter in the mail two months in advance. If you don't receive a letter, please call our office to schedule the follow-up appointment.  Your physician has recommended you make the following change in your medication: Increase Carvedilol to 12.5 mg twice daily

## 2015-06-13 ENCOUNTER — Telehealth: Payer: Self-pay | Admitting: Cardiology

## 2015-06-13 ENCOUNTER — Telehealth: Payer: Self-pay | Admitting: *Deleted

## 2015-06-13 NOTE — Telephone Encounter (Signed)
Spoke with Carol Warren concerning her Coreg told her to Double up on her 6.25 mg to make it 12.5 mg twice a day until finish, then she can start taking her 12.5 mg twice daily.

## 2015-06-13 NOTE — Telephone Encounter (Signed)
Mrs. Sanseverino is calling because her medication dosage was chacnge and she wants to be clear on what the instructions are. Please call    Thanks

## 2015-06-13 NOTE — Telephone Encounter (Signed)
Staff message sent to nya concerning med change on 06-12-15

## 2015-06-15 NOTE — Progress Notes (Signed)
CRT-D device check in office for ?upper rate behavior per [facility]. Thresholds and sensing consistent with previous device measurements. Lead impedance trends stable over time. No mode switch episodes recorded. No ventricular arrhythmia episodes recorded. Patient bi-ventricularly pacing 99.9% of the time. Device programmed with appropriate safety margins. Heart failure diagnostics reviewed and trends are stable for patient. Audible alerts demonstrated for patient. Auto PVARP d/c'd---permanently programmed to 231m. VA conduction test ran at VVI 110---no VA conduction. Estimated longevity 7 years. Patient will follow up via [facility] on 9/14.

## 2015-06-19 ENCOUNTER — Ambulatory Visit: Payer: Self-pay | Admitting: Podiatry

## 2015-06-21 ENCOUNTER — Telehealth: Payer: Self-pay | Admitting: *Deleted

## 2015-06-21 ENCOUNTER — Telehealth: Payer: Self-pay | Admitting: Internal Medicine

## 2015-06-21 NOTE — Telephone Encounter (Signed)
New Message  Pt c/o BP issue:  1. None of the smart phrases fit.    Pt called states that she went to cardiac rehab at [place]. She reports that her "Heart was doing crazy things". Heart rate was 117 when she checked in however when she was at rest they found that her HR was 124. Pt then states that she started with the treadmill 3 miles and hour for 10 minutes. She completed a fast walk for one round and jogged a 1/2 mile. She was instructed to stop because her heart rate went down verses going up. HR fell down to 69 (this was heart heart rate) but her BP was good (No readings for BP). Pt states that the cardiac rehab would also fax the information over for review. Please call back to discuss.

## 2015-06-21 NOTE — Telephone Encounter (Signed)
[name] from Cardiac Rehab call about Carol Warren, She stated she had cardiac Rehab today and come in heart rate at rest is 107, she exercise and her heart rate responds went up to 131, and then immediately drop to 71, then with cool down she jump back up to 116. Her blood pressure remain stable but the patient reported she felt exhausted and SOB, she is on her 26 session of Cardiac Rehab and she can come up to 36 session, she want to know if she should continue and want her to be evaluated first before she can be return to the program.  Message routed to [name] Dr Lovena Le Nurse

## 2015-06-22 ENCOUNTER — Encounter: Payer: Self-pay | Admitting: Internal Medicine

## 2015-06-22 ENCOUNTER — Encounter: Payer: Medicare Other | Admitting: *Deleted

## 2015-06-22 ENCOUNTER — Ambulatory Visit (INDEPENDENT_AMBULATORY_CARE_PROVIDER_SITE_OTHER): Payer: Medicare Other | Admitting: Internal Medicine

## 2015-06-22 VITALS — BP 132/78 | HR 109 | Ht 61.0 in | Wt 108.8 lb

## 2015-06-22 DIAGNOSIS — Z95810 Presence of automatic (implantable) cardiac defibrillator: Secondary | ICD-10-CM | POA: Diagnosis not present

## 2015-06-22 DIAGNOSIS — I429 Cardiomyopathy, unspecified: Secondary | ICD-10-CM | POA: Diagnosis not present

## 2015-06-22 DIAGNOSIS — I442 Atrioventricular block, complete: Secondary | ICD-10-CM | POA: Diagnosis not present

## 2015-06-22 DIAGNOSIS — I5022 Chronic systolic (congestive) heart failure: Secondary | ICD-10-CM

## 2015-06-22 DIAGNOSIS — Z954 Presence of other heart-valve replacement: Secondary | ICD-10-CM

## 2015-06-22 DIAGNOSIS — I428 Other cardiomyopathies: Secondary | ICD-10-CM

## 2015-06-22 DIAGNOSIS — Z952 Presence of prosthetic heart valve: Secondary | ICD-10-CM

## 2015-06-22 LAB — CUP PACEART INCLINIC DEVICE CHECK
Battery Voltage: 3 V
Brady Statistic AP VP Percent: 0.04 %
Brady Statistic AS VP Percent: 99.84 %
Brady Statistic RV Percent Paced: 99.8 %
Date Time Interrogation Session: 20160623170323
HighPow Impedance: 190 Ohm
HighPow Impedance: 63 Ohm
Lead Channel Impedance Value: 456 Ohm
Lead Channel Pacing Threshold Amplitude: 0.5 V
Lead Channel Pacing Threshold Amplitude: 0.75 V
Lead Channel Pacing Threshold Pulse Width: 0.4 ms
Lead Channel Setting Pacing Amplitude: 2 V
Lead Channel Setting Pacing Amplitude: 2.5 V
Lead Channel Setting Pacing Pulse Width: 0.4 ms
Lead Channel Setting Sensing Sensitivity: 0.3 mV
MDC IDC MSMT BATTERY REMAINING LONGEVITY: 82 mo
MDC IDC MSMT LEADCHNL LV PACING THRESHOLD PULSEWIDTH: 0.4 ms
MDC IDC MSMT LEADCHNL RA PACING THRESHOLD AMPLITUDE: 0.75 V
MDC IDC MSMT LEADCHNL RA PACING THRESHOLD PULSEWIDTH: 0.4 ms
MDC IDC MSMT LEADCHNL RA SENSING INTR AMPL: 1 mV
MDC IDC MSMT LEADCHNL RV IMPEDANCE VALUE: 589 Ohm
MDC IDC MSMT LEADCHNL RV SENSING INTR AMPL: 10.875 mV
MDC IDC SET LEADCHNL LV PACING PULSEWIDTH: 0.4 ms
MDC IDC SET LEADCHNL RA PACING AMPLITUDE: 1.5 V
MDC IDC SET ZONE DETECTION INTERVAL: 360 ms
MDC IDC STAT BRADY AP VS PERCENT: 0.01 %
MDC IDC STAT BRADY AS VS PERCENT: 0.11 %
MDC IDC STAT BRADY RA PERCENT PACED: 0.05 %
Zone Setting Detection Interval: 300 ms
Zone Setting Detection Interval: 350 ms
Zone Setting Detection Interval: 450 ms

## 2015-06-22 MED ORDER — CARVEDILOL 12.5 MG PO TABS: ORAL_TABLET | ORAL | Status: DC

## 2015-06-22 NOTE — Telephone Encounter (Signed)
Appointment made with Dr. Lovena Le at 3:15pm today.

## 2015-06-22 NOTE — Patient Instructions (Addendum)
Medication Instructions:  Your physician has recommended you make the following change in your medication:  1) INCREASE Coreg to 18.75 mg (one and half tablets) by mouth TWICE daily   Labwork: NONE  Testing/Procedures: NONE  Follow-Up: Remote monitoring is used to monitor your Pacemaker or ICD from home. This monitoring reduces the number of office visits required to check your device to one time per year. It allows [place] to keep an eye on the functioning of your device to ensure it is working properly. You are scheduled for a device check from home on 09/21/2015. You may send your transmission at any time that day. If you have a wireless device, the transmission will be sent automatically. After your physician reviews your transmission, you will receive a postcard with your next transmission date.   Your physician wants you to follow-up in: January 2017 with Dr. Lovena Le. You will receive a reminder letter in the mail two months in advance. If you don't receive a letter, please call our office to schedule the follow-up appointment.   Any Other Special Instructions Will Be Listed Below (If Applicable).

## 2015-06-22 NOTE — Telephone Encounter (Signed)
Patient was seen by Dr. Lovena Le today.

## 2015-06-22 NOTE — Telephone Encounter (Signed)
Vennie Homans at 06/21/2015 5:12 PM     Status: Signed       Expand All Collapse All   Judeen Hammans from Cardiac Rehab call about Ms Benedick, She stated she had cardiac Rehab today and come in heart rate at rest is 107, she exercise and her heart rate responds went up to 131, and then immediately drop to 71, then with cool down she jump back up to 116. Her blood pressure remain stable but the patient reported she felt exhausted and SOB, she is on her 26 session of Cardiac Rehab and she can come up to 36 session, she want to know if she should continue and want her to be evaluated first before she can be return to the program.  Message routed to Janan Halter Dr Lovena Le Nurse       Above call came in from Cardiac Rehab @ [facility] 6/22 Patient is concerned leads in her device may not be in proper places and MD should take a look at this.  Report from cardiac rehab will be given to Dr. Rosezella [place] nurse to review with him.   Patient is requesting a MD visit and states "you cannot see it from the outside" - meaning her heart?   Message will be routed to Dr. Percival Spanish & Dr. Lovena Le

## 2015-06-23 ENCOUNTER — Encounter: Payer: Self-pay | Admitting: *Deleted

## 2015-06-23 NOTE — Assessment & Plan Note (Signed)
Her medtronic BiV device has been reprogrammed today. She was having intermittent dropped beats due to failure to output through her ICD> Her device has been reprogrammed to not fail to pace by increasing her max track rate and adjust PVARP.

## 2015-06-23 NOTE — Assessment & Plan Note (Signed)
On exam she has no evidence of AI.

## 2015-06-23 NOTE — Progress Notes (Signed)
HPI Carol Warren returns today for followup. She is a pleasant 69 yo woman with a h/o chronic systolic heart failure and complete heart block who is s/p PPM insertion. She was found at cardiac rehab to have intermittant failure to pace in the ventricle. She feels well but has not been allowed back into cardiac rehab. No other complaints today.  Allergies  Allergen Reactions  . Oxycodone Nausea Only     Current Outpatient Prescriptions  Medication Sig Dispense Refill  . albuterol (PROVENTIL HFA;VENTOLIN HFA) 108 (90 BASE) MCG/ACT inhaler Inhale 1-2 puffs into the lungs every 6 (six) hours as needed for wheezing or shortness of breath.    [name] albuterol (PROVENTIL) (2.5 MG/3ML) 0.083% nebulizer solution Take 2.5 mg by nebulization 2 (two) times daily as needed for wheezing.    . AMBULATORY NON FORMULARY MEDICATION Topical nasal rinse/ Tobram Budesonide Use nasal rinse daily as needed for dryness    . aspirin EC 325 MG EC tablet Take 1 tablet (325 mg total) by mouth daily. 30 tablet 0  . B Complex Vitamins (VITAMIN B COMPLEX PO) Take 1 tablet by mouth daily. ON HOLD UNTIL 02/20/15    . bisacodyl (DULCOLAX) 5 MG EC tablet Take 5 mg by mouth daily as needed for moderate constipation.    [name] buPROPion (WELLBUTRIN XL) 300 MG 24 hr tablet Take 300 mg by mouth daily.    . Calcium Carbonate-Vitamin D (CALCIUM-D PO) Take 1 tablet by mouth daily.     . carvedilol (COREG) 12.5 MG tablet Take one and half tablets (18.75 mg) by mouth TWICE daily. 60 tablet 6  . cyanocobalamin (,VITAMIN B-12,) 1000 MCG/ML injection Inject 1,000 mcg into the muscle once a week. ON HOLD UNTIL 02/20/15    . diazepam (VALIUM) 10 MG tablet Take 10 mg by mouth 3 (three) times daily as needed for anxiety.    . fluticasone (FLONASE) 50 MCG/ACT nasal spray Place 1 spray into the nose 2 (two) times daily.     . Halobetasol-Ammonium Lactate 0.05 & 12 % (CREAM) KIT Apply 1 application topically daily as needed (dryness).    [name]  HYDROcodone-acetaminophen (NORCO/VICODIN) 5-325 MG per tablet Take 1 tablet by mouth every 6 (six) hours as needed for moderate pain or severe pain. 40 tablet 0  . loratadine (CLARITIN) 10 MG tablet Take 10 mg by mouth daily.    [name] losartan (COZAAR) 25 MG tablet Take 1 tablet (25 mg total) by mouth daily. 30 tablet 3  . meloxicam (MOBIC) 15 MG tablet Take 15 mg by mouth daily as needed for pain.    . Menthol, Topical Analgesic, (ICY HOT EX) Apply 1 patch topically daily.    . montelukast (SINGULAIR) 10 MG tablet Take 10 mg by mouth at bedtime.    . Multiple Vitamin (MULTIVITAMIN) capsule Take 1 capsule by mouth daily.    . nicotine (NICODERM CQ - DOSED IN MG/24 HOURS) 21 mg/24hr patch Place 1 patch (21 mg total) onto the skin daily. 28 patch 0  . NON FORMULARY Buminate ophth sol into the eyes two times a day    . Olopatadine HCl (PATANASE) 0.6 % SOLN Place 2 drops into both nostrils 2 (two) times daily.     [name] omeprazole (PRILOSEC) 20 MG capsule Take 40 mg by mouth daily.    . polyethylene glycol (MIRALAX / GLYCOLAX) packet Take 17 g by mouth daily as needed for mild constipation.     . Probiotic Product (PROBIOTIC DAILY PO)  Take 1 capsule by mouth daily. Ultimate flora    . pseudoephedrine-guaifenesin (MUCINEX D) 60-600 MG per tablet Take 1 tablet by mouth daily as needed for congestion.     . QUEtiapine (SEROQUEL) 25 MG tablet Take 25 mg by mouth at bedtime.    . ranitidine (ZANTAC) 300 MG capsule Take 300 mg by mouth every evening.    . Saline (SIMPLY SALINE) 0.9 % AERS Place 2 sprays into both nostrils 2 (two) times daily.     No current facility-administered medications for this visit.     Past Medical History  Diagnosis Date  . 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 Osteopenia   . HSV-1 (herpes simplex virus 1) infection   . HSV-2 (herpes simplex virus 2) infection   . Bicuspid aortic valve     CONGENITAL  . Aortic aneurysm   . Adrenal tumor   . Degenerative disc disease       CERVICAL AND LUMBAR (DR. Lorin Mercy)  . Smoker   . Anxiety   . Chronic sinusitis   . Chronic depression   . Osteoarthritis   . Pernicious anemia   . Insomnia   . Asthma   . Chronic fatigue   . Vitamin B 12 deficiency   . Plantar fasciitis   . Hammer toe of right foot   . Hepatomegaly   . PONV (postoperative nausea and vomiting)   . Shortness of breath dyspnea   . COPD (chronic obstructive pulmonary disease)   . GERD (gastroesophageal reflux disease)   . Carpal tunnel syndrome, bilateral     ROS:   All systems reviewed and negative except as noted in the HPI.   Past Surgical History  Procedure Laterality Date  . Excision of vulvar cis    . Sinus procedure  2009    x 6  . Excision of basal cell ca      SKIN   . Cataract extraction Bilateral     X2  . Carpal tunnel release Bilateral 2008  . Bunionectomy with hammertoe reconstruction Right   . Colonoscopy    . Upper gastrointestinal endoscopy    . Tee without cardioversion N/A 10/06/2014    Procedure: TRANSESOPHAGEAL ECHOCARDIOGRAM (TEE);  Surgeon: Sueanne Margarita, MD;  Location[facility] ENDOSCOPY;  Service: Cardiovascular;  Laterality: N/A;  . Left and right heart catheterization with coronary angiogram N/A 09/23/2014    Procedure: LEFT AND RIGHT HEART CATHETERIZATION WITH CORONARY ANGIOGRAM;  Surgeon: Minus Breeding, MD;  Location: [facility] CATH LAB;  Service: Cardiovascular;  Laterality: N/A;  . Aortic valve replacement N/A 01/12/2015    Procedure: AORTIC VALVE REPLACEMENT (AVR);  Surgeon: Gaye Pollack, MD;  Location: [place];  Service: Open Heart Surgery;  Laterality: N/A;  . Ascending aortic root replacement N/A 01/12/2015    Procedure: ASCENDING AORTIC ROOT REPLACEMENT;  Surgeon: Gaye Pollack, MD;  Location: [place];  Service: Open Heart Surgery;  Laterality: N/A;  . Tee without cardioversion N/A 01/12/2015    Procedure: TRANSESOPHAGEAL ECHOCARDIOGRAM (TEE);  Surgeon: Gaye Pollack, MD;  Location: [place];  Service: Open Heart Surgery;   Laterality: N/A;  . Bi-ventricular pacemaker insertion N/A 01/18/2015    Procedure: BI-VENTRICULAR PACEMAKER INSERTION (CRT-P);  Surgeon: Evans Lance, MD;  Location: [facility] CATH LAB;  Service: Cardiovascular;  Laterality: N/A;     Family History  Problem Relation Age of Onset  . Pancreatic cancer Mother 60  . Diabetes Father   . Hypertension Father   .  Heart disease Father 77    CAD  . Breast cancer Sister   . Diabetes Maternal Grandmother   . Hypertension Maternal Grandmother   . Cancer Maternal Grandfather     colon or stomach  . Hypertension Paternal Grandmother   . Heart disease Paternal Grandmother     Later onset  . Asthma Grandchild   . Diverticulosis Paternal Grandmother      History   Social History  . Marital Status: Married    Spouse Name: N/A  . Number of Children: 2  . Years of Education: N/A   Occupational History  . retired    Social History Main Topics  . Smoking status: Current Every Day Smoker -- 1.00 packs/day for 50 years    Types: Cigarettes  . Smokeless tobacco: Never Used     Comment: uses vapor cig  . Alcohol Use: No  . Drug Use: No  . Sexual Activity: Not on file   Other Topics Concern  . Not on file   Social History Narrative     BP 132/78 mmHg  Pulse 109  Ht 5' 1"  (1.549 m)  Wt 108 lb 12.8 oz (49.351 kg)  BMI 20.57 kg/m2  SpO2 95%  Physical Exam:  Well appearing 69 yo woman, NAD HEENT: Unremarkable Neck:  6 cm JVD, no thyromegally Back:  No CVA tenderness Lungs:  Clear with no wheezes HEART:  Regular rate rhythm, no murmurs, no rubs, no clicks Abd:  soft, positive bowel sounds, no organomegally, no rebound, no guarding Ext:  2 plus pulses, no edema, no cyanosis, no clubbing Skin:  No rashes no nodules Neuro:  CN II through XII intact, motor grossly intact  DEVICE  Normal device function.  See PaceArt for details.   Assess/Plan:

## 2015-06-23 NOTE — Assessment & Plan Note (Addendum)
Her symptoms are class 2. She will continue her current meds. I have asked her to uptitrate her beta blocker.

## 2015-06-26 ENCOUNTER — Telehealth: Payer: Self-pay | Admitting: Internal Medicine

## 2015-06-26 ENCOUNTER — Encounter: Payer: Self-pay | Admitting: Internal Medicine

## 2015-06-26 NOTE — Telephone Encounter (Signed)
This encounter was created in error - please disregard.

## 2015-06-26 NOTE — Telephone Encounter (Signed)
New Prob    Calling to obtain documentation regarding pts adjustment during last follow up. Also, Venida Jarvis is calling to verify this pt is able to return back to rehab.

## 2015-06-26 NOTE — Telephone Encounter (Signed)
Carol Warren at 06/26/2015 1:15 PM     Status: Signed       Expand All Collapse All   Follow Up     -Pt has Many questions for the Rn pertaining to her Spine procedure she needs Dr. Christie Beckers  -Pt has an infection under her tooth in her mouth pt is on antibiotics  -Pt said she needs a smaller dosage for medication (Coreg) it makes her sick to her stomach  -Has the doctor sent approval for therapy

## 2015-06-26 NOTE — Telephone Encounter (Signed)
Cardiac Rehab needs written statement from Dr. Lovena Le stating that pt can return to therapy.  Request routed to MD for review and update

## 2015-06-26 NOTE — Telephone Encounter (Signed)
Follow Up      -Pt has Many questions for the Rn pertaining to her Spine procedure she needs Dr. Christie Beckers  -Pt has an infection under her tooth in her mouth pt is on antibiotics  -Pt said she needs a smaller dosage for medication (Coreg) it makes her sick to her stomach  -Has the doctor sent approval for therapy

## 2015-06-27 MED ORDER — CARVEDILOL 12.5 MG PO TABS: 12.5000 mg | ORAL_TABLET | Freq: Two times a day (BID) | ORAL | Status: DC

## 2015-06-27 NOTE — Telephone Encounter (Signed)
Faxed letter to clearance letter Cardiac Rehab with copy of pt OV notes for their records.  Left message to let pt that clearance has been sent to Cardiaci Rehab. Fax to [facility] [phone]; phone [phone]

## 2015-06-27 NOTE — Telephone Encounter (Signed)
She is having an electronic stimulator placed in her back for pain.  The device is made by [employer] and her device is [employer].  A [employer] rep is going to be there for the trail implant.  If in 7 days she has improvement with her pain she will have a permanent stimulator placed in 2-3 months.  This is done under local at the pain management center at [facility] Dr Ernestina Patches is doing the procedure.    Fax clearance to Dr Ernestina Patches at [phone] Jinny Blossom is RN  Feels like the Carvedilol  18.75 mg bid is too much and she can not tolerate.  She is going to go back to 12.5 mg bid

## 2015-06-28 NOTE — Telephone Encounter (Signed)
New message    Patient calling  Had cardiac rehab on today C/O   Heart rate drop to  56 after 30 minutes of exercise.     Also have some concern regarding upcoming back procedure on Friday 7.1.2016

## 2015-06-28 NOTE — Telephone Encounter (Signed)
Follow up    Cardiac Rehab calling regarding patient rehab dropping during exercise - 129 - 63 in the matter of 8 second.   Cardiac Notes was fax over today

## 2015-06-29 ENCOUNTER — Encounter: Payer: Self-pay | Admitting: Internal Medicine

## 2015-06-29 ENCOUNTER — Ambulatory Visit (INDEPENDENT_AMBULATORY_CARE_PROVIDER_SITE_OTHER): Payer: Medicare Other | Admitting: *Deleted

## 2015-06-29 DIAGNOSIS — Z4502 Encounter for adjustment and management of automatic implantable cardiac defibrillator: Secondary | ICD-10-CM

## 2015-06-29 LAB — CUP PACEART INCLINIC DEVICE CHECK
Battery Voltage: 2.99 V
Brady Statistic AP VP Percent: 0.09 %
Brady Statistic AS VP Percent: 99.89 %
Brady Statistic RA Percent Paced: 0.09 %
Date Time Interrogation Session: 20160630172502
HIGH POWER IMPEDANCE MEASURED VALUE: 66 Ohm
HighPow Impedance: 190 Ohm
Lead Channel Pacing Threshold Amplitude: 0.5 V
Lead Channel Pacing Threshold Amplitude: 0.625 V
Lead Channel Pacing Threshold Amplitude: 0.75 V
Lead Channel Pacing Threshold Pulse Width: 0.4 ms
Lead Channel Sensing Intrinsic Amplitude: 1.25 mV
Lead Channel Sensing Intrinsic Amplitude: 10.875 mV
Lead Channel Setting Pacing Amplitude: 2.25 V
Lead Channel Setting Pacing Pulse Width: 0.4 ms
MDC IDC MSMT BATTERY REMAINING LONGEVITY: 84 mo
MDC IDC MSMT LEADCHNL RA IMPEDANCE VALUE: 456 Ohm
MDC IDC MSMT LEADCHNL RA PACING THRESHOLD PULSEWIDTH: 0.4 ms
MDC IDC MSMT LEADCHNL RA SENSING INTR AMPL: 1.25 mV
MDC IDC MSMT LEADCHNL RV IMPEDANCE VALUE: 551 Ohm
MDC IDC MSMT LEADCHNL RV PACING THRESHOLD PULSEWIDTH: 0.4 ms
MDC IDC MSMT LEADCHNL RV SENSING INTR AMPL: 7.625 mV
MDC IDC SET LEADCHNL RA PACING AMPLITUDE: 1.5 V
MDC IDC SET LEADCHNL RV PACING AMPLITUDE: 2 V
MDC IDC SET LEADCHNL RV PACING PULSEWIDTH: 0.4 ms
MDC IDC SET LEADCHNL RV SENSING SENSITIVITY: 0.3 mV
MDC IDC SET ZONE DETECTION INTERVAL: 350 ms
MDC IDC STAT BRADY AP VS PERCENT: 0.01 %
MDC IDC STAT BRADY AS VS PERCENT: 0.01 %
MDC IDC STAT BRADY RV PERCENT PACED: 99.96 %
Zone Setting Detection Interval: 300 ms
Zone Setting Detection Interval: 360 ms
Zone Setting Detection Interval: 450 ms

## 2015-06-29 NOTE — Progress Notes (Signed)
Pt walked on treadmill to achieve HR of 130-140bpm. Interrogated during walk. 2:1 blocking seen- industry consulted tech services. PMT intervention, PVC response and non-competitive atrial pacing all turned off. Follow up as scheduled.

## 2015-06-29 NOTE — Telephone Encounter (Signed)
Patient came in today and [employer] Rep came also.  Patient was placed on the treadmill and device was reprogrammed again.  Tomi Bamberger will meet her at cardiac rehab tomorrow to make sure things are good

## 2015-07-10 ENCOUNTER — Ambulatory Visit (INDEPENDENT_AMBULATORY_CARE_PROVIDER_SITE_OTHER): Payer: Medicare Other | Admitting: Podiatry

## 2015-07-10 ENCOUNTER — Encounter: Payer: Self-pay | Admitting: Podiatry

## 2015-07-10 ENCOUNTER — Ambulatory Visit (INDEPENDENT_AMBULATORY_CARE_PROVIDER_SITE_OTHER): Payer: Medicare Other

## 2015-07-10 VITALS — BP 162/99 | HR 98 | Resp 18

## 2015-07-10 DIAGNOSIS — M79673 Pain in unspecified foot: Secondary | ICD-10-CM

## 2015-07-10 DIAGNOSIS — M722 Plantar fascial fibromatosis: Secondary | ICD-10-CM

## 2015-07-10 NOTE — Progress Notes (Signed)
   Subjective:    Patient ID: Carol Warren, female    DOB: 1946/01/19, 69 y.o.   MRN: [id number]  HPI I HAVE SOME CROOKED TOES AND THERE IS PAIN ON THE BOTTOM OF MY FOOT AND I HAVE HAD 2 SURGERIES ON MY RIGHT FOOT AND THEY MESSED UP MY RIGHT FOOT AND HURTS ON THE BOTTOM AND MY TOES ARE GROWING UNDER AND I DO HAVE SOME INSERTS This patient presents to my office with pain in her right foot.  She is very active and exercises.  She has developed pain through her right foot .  She says her surgeries have led to her pain despite the fact she continues to exercise.  She relates pain through the bottom of her right foot.  She has tried shoes and inserts and her pain persists.   Review of Systems  Constitutional: Positive for appetite change, fatigue and unexpected weight change.  HENT: Positive for sinus pressure.        RINGING IN EARS  Eyes:       DRY EYES  Respiratory: Positive for shortness of breath.   Gastrointestinal: Positive for constipation.  Genitourinary: Positive for frequency.  Musculoskeletal: Positive for back pain.  Allergic/Immunologic: Positive for environmental allergies.  Neurological: Positive for weakness.  Hematological: Bruises/bleeds easily.  Psychiatric/Behavioral: The patient is nervous/anxious.   All other systems reviewed and are negative.      Objective:   Physical Exam Objective: Review of past medical history, medications, social history and allergies were performed.  Vascular: Dorsalis pedis and posterior tibial pulses were palpable B/L, capillary refill was  WNL B/L, temperature gradient was WNL B/L   Skin:  No signs of symptoms of infection or ulcers on both feet  Nails: appear healthy with no signs of mycosis or infections  Sensory: Semmes Weinstein monifilament WNL   Orthopedic: Orthopedic evaluation demonstrates all joints distal t ankle have full ROM without crepitus, muscle power WNL B/L.  Palpable pain through the arch of her right  foot.        Assessment & Plan:  Plantar Fascitis right foot  ROV    Powersteps were dispensed.

## 2015-07-11 ENCOUNTER — Telehealth: Payer: Self-pay | Admitting: Internal Medicine

## 2015-07-11 NOTE — Telephone Encounter (Signed)
New message     Request for surgical clearance:  1. What type of surgery is being performed? 6 surgical extractions  2. When is this surgery scheduled? Not scheduled yet  3. Are there any medications that need to be held prior to surgery and how long? Aspirin and for how many days.  Also needs to know if she is on any other medications that need to be held    4. Name of physician performing surgery? Dr. Huston Foley  5. What is your office phone and fax number? Ofc. [phone] Fax [phone]  Dr. Huston Foley is also wanting to know if you advise this procedure should be done by oral surgeon due to patient heart history. Please call to advise

## 2015-07-12 NOTE — Telephone Encounter (Signed)
Dr Lovena Le has signed and cleared to proceed.  Will fax

## 2015-07-14 ENCOUNTER — Telehealth: Payer: Self-pay | Admitting: Internal Medicine

## 2015-07-14 NOTE — Telephone Encounter (Signed)
Wil have [employer] rep meet her at cardiac rehab Mon at 11:00.  Tomi Bamberger aware

## 2015-07-14 NOTE — Telephone Encounter (Signed)
New message     Pt states that she was walking on treadmill today at 3 m.p.h. with a 2 incline.  During the walks today her heart rate dropped to 59 from 126.  This happened during 3 of the 6 10 minute exercises she did today. Pt states she feels that the pacemaker is not reacting the way it should with the top part of her heart. Pt states that she has not having shortness of breath.    Please call to discuss.

## 2015-07-17 ENCOUNTER — Telehealth: Payer: Self-pay | Admitting: *Deleted

## 2015-07-17 ENCOUNTER — Telehealth: Payer: Self-pay | Admitting: Internal Medicine

## 2015-07-17 NOTE — Telephone Encounter (Signed)
Follow up    Pt returning Shakila's call.   Please call to advise

## 2015-07-17 NOTE — Telephone Encounter (Signed)
New message      Talk to  Shakila----she forgot to ask you a question

## 2015-07-17 NOTE — Telephone Encounter (Signed)
LMTCB/SSS 

## 2015-07-17 NOTE — Telephone Encounter (Signed)
Appt made for 07/19/2015 @ 1030 w/[facility].

## 2015-07-17 NOTE — Telephone Encounter (Signed)
Attempted call x 1---N/A

## 2015-07-18 NOTE — Telephone Encounter (Signed)
Spoke to Carol Warren about pt's follow up for tomorrow. She stated that she had talked to Dr.Taylor and he was fine with the changes that were made in rehab yesterday. Called pt to inform her that she did not need appt. Appt cx'd. Patient voiced understanding.

## 2015-07-21 ENCOUNTER — Telehealth: Payer: Self-pay | Admitting: Internal Medicine

## 2015-07-21 NOTE — Telephone Encounter (Signed)
Informed patient that from a device standpoint she would be ok to swim since implant was in 12-2014. Patient voiced understanding.

## 2015-07-21 NOTE — Telephone Encounter (Signed)
New message     FYI--Since changes made to pacemaker on Monday, pt states she has had no problems. Pt wants to know if she can go swimming and dive off diving board. Please call to discuss

## 2015-07-25 ENCOUNTER — Telehealth: Payer: Self-pay | Admitting: Cardiology

## 2015-07-25 ENCOUNTER — Other Ambulatory Visit: Payer: Self-pay | Admitting: Cardiology

## 2015-07-25 NOTE — Telephone Encounter (Signed)
I spoke with patient.  EP gave permission to go swimming.  I confirmed with patient that she does not have any open wounds.  I advised patient that I do not see any reason why she cannot swim.

## 2015-07-25 NOTE — Telephone Encounter (Signed)
Carol Warren wants to know if she can go swimming and jump off of the diving pool also that Dr.Hochrein may be getting results from a Ultrasound on her left leg for clots . Please call    Thanks

## 2015-07-31 ENCOUNTER — Ambulatory Visit (INDEPENDENT_AMBULATORY_CARE_PROVIDER_SITE_OTHER): Payer: Medicare Other | Admitting: *Deleted

## 2015-07-31 DIAGNOSIS — Z95810 Presence of automatic (implantable) cardiac defibrillator: Secondary | ICD-10-CM

## 2015-07-31 DIAGNOSIS — I5022 Chronic systolic (congestive) heart failure: Secondary | ICD-10-CM | POA: Diagnosis not present

## 2015-08-03 ENCOUNTER — Encounter: Payer: Self-pay | Admitting: *Deleted

## 2015-08-03 NOTE — Progress Notes (Signed)
EPIC Encounter for ICM Monitoring  Patient Name: Carol Warren is a 69 y.o. female Date: 08/03/2015 Primary Care Physican: Ernestene Kiel, MD  Primary Cardiologist: [place] Electrophysiologist: Lovena Le Dry Weight: 104 lbs  Bi-V pacing: 99.8%       In the past month, have you:  1. Gained more than 2 pounds in a day or more than 5 pounds in a week? no  2. Had changes in your medications (with verification of current medications)? Yes- Coreg was increased on 06/12/15 to 12.5 mg BID and increased again on 06/22/15 to 18.75 mg BID. She had GI upset on this higher dose and was instructed to decrease her dose back to 12.5 mg BID.  3. Had more shortness of breath than is usual for you? Yes- this is due to sinus issues/ seasonal allergies- she is on her 2nd antibiotic for this.  4. Limited your activity because of shortness of breath? no  5. Not been able to sleep because of shortness of breath? no  6. Had increased swelling in your feet or ankles? no  7. Had symptoms of dehydration (dizziness, dry mouth, increased thirst, decreased urine output) no  8. Had changes in sodium restriction? no  9. Been compliant with medication? Yes   ICM trend:   Follow-up plan: ICM clinic phone appointment: 09/06/15. The patient is doing well from a cardiac standpoint. She is pending oral surgery on Monday. No changes made today.  Copy of note sent to patient's primary care physician, primary cardiologist, and device following physician.  Alvis Lemmings, RN, BSN 08/03/2015 1:34 PM

## 2015-08-19 ENCOUNTER — Encounter: Payer: Self-pay | Admitting: Cardiology

## 2015-08-29 ENCOUNTER — Telehealth: Payer: Self-pay | Admitting: Cardiology

## 2015-08-29 NOTE — Telephone Encounter (Signed)
Patient will use knee highs.  She would also like Dr. Percival Spanish to review her recent vascular ultrasound she had at [facility].  Patient will make sure that PCP will send results.  I will follow up

## 2015-08-29 NOTE — Telephone Encounter (Signed)
Pt called in wanting to speak with Dr. Rosezella [place] nurse about a recent vascular ultrasound she just had performed. Dr. Lorenza Burton) told her she had a veinous insufficiency. He advised her to wear some compression hose for 3 months and then come back to get a shot. She is somewhat concerned and would like some input from Dr. Percival Spanish. Please call  Thanks

## 2015-08-29 NOTE — Telephone Encounter (Signed)
OK to wear.

## 2015-08-29 NOTE — Telephone Encounter (Signed)
Patient wants to know if it is okay to wear knee high support hose since she can't get a hose that are small enough for her thigh and large enough for her calf.    Radiologist at [facility] said needs 3 months of compression stockings for varicose veins prior to receive injection therapy for legs to have it covered by insurance

## 2015-09-02 ENCOUNTER — Encounter: Payer: Self-pay | Admitting: Cardiology

## 2015-09-05 ENCOUNTER — Telehealth: Payer: Self-pay | Admitting: Cardiology

## 2015-09-05 NOTE — Telephone Encounter (Signed)
Message was send to lynn to get results from [facility].

## 2015-09-05 NOTE — Telephone Encounter (Signed)
Pt called in stating that she had an ultrasound of her legs done at [facility] and she wanted to make sure that Dr. Percival Spanish received the results. Even though the Radiologist is from [facility] (she says), Dr. Percival Spanish still needs to request the results from the hospital. The fax number there is [phone].Please f/u with her if you need to   Thanks

## 2015-09-06 ENCOUNTER — Ambulatory Visit (INDEPENDENT_AMBULATORY_CARE_PROVIDER_SITE_OTHER): Payer: Medicare Other

## 2015-09-06 DIAGNOSIS — I5022 Chronic systolic (congestive) heart failure: Secondary | ICD-10-CM | POA: Diagnosis not present

## 2015-09-06 DIAGNOSIS — Z95810 Presence of automatic (implantable) cardiac defibrillator: Secondary | ICD-10-CM

## 2015-09-06 NOTE — Progress Notes (Signed)
EPIC Encounter for ICM Monitoring  Patient Name: Carol Warren is a 69 y.o. female Date: 09/06/2015 Primary Care Physican: Ernestene Kiel, MD Primary Cardiologist: [place] Electrophysiologist: Lovena Le Dry Weight: 103 lbs       Bi-V Pacing 99.9%  In the past month, have you:  1. Gained more than 2 pounds in a day or more than 5 pounds in a week? no  2. Had changes in your medications (with verification of current medications)? no  3. Had more shortness of breath than is usual for you? no  4. Limited your activity because of shortness of breath? no  5. Not been able to sleep because of shortness of breath? no  6. Had increased swelling in your feet or ankles? no  7. Had symptoms of dehydration (dizziness, dry mouth, increased thirst, decreased urine output) no  8. Had changes in sodium restriction? no  9. Been compliant with medication? Yes   ICM trend:   Follow-up plan: ICM clinic phone appointment 10/10/2015.  No changes today.  Patient is doing well from cardiac standpoint.  She a lot of teeth pulled and she has pain in legs from venous insufficiency.    Copy of note sent to patient's primary care physician, primary cardiologist, and device following physician.  Rosalene Billings, RN, CCM 09/06/2015 3:01 PM

## 2015-09-06 NOTE — Telephone Encounter (Signed)
Ultrasound of legs was given to Dr Percival Spanish for review.[name]

## 2015-10-10 ENCOUNTER — Encounter: Payer: Self-pay | Admitting: Internal Medicine

## 2015-10-10 ENCOUNTER — Ambulatory Visit (INDEPENDENT_AMBULATORY_CARE_PROVIDER_SITE_OTHER): Payer: Medicare Other | Admitting: *Deleted

## 2015-10-10 DIAGNOSIS — I428 Other cardiomyopathies: Secondary | ICD-10-CM

## 2015-10-10 DIAGNOSIS — Z95810 Presence of automatic (implantable) cardiac defibrillator: Secondary | ICD-10-CM

## 2015-10-10 DIAGNOSIS — I5022 Chronic systolic (congestive) heart failure: Secondary | ICD-10-CM

## 2015-10-10 DIAGNOSIS — I429 Cardiomyopathy, unspecified: Secondary | ICD-10-CM | POA: Diagnosis not present

## 2015-10-10 NOTE — Progress Notes (Addendum)
EPIC Encounter for ICM Monitoring  Patient Name: Carol Warren is a 69 y.o. female Date: 10/10/2015 Primary Care Physican: Ernestene Kiel, MD Primary Cardiologist: [place] Electrophysiologist: Lovena Le Dry Weight: 106 lb   Bi-V Pacing 99.9%      In the past month, have you:  1. Gained more than 2 pounds in a day or more than 5 pounds in a week? no  2. Had changes in your medications (with verification of current medications)? no  3. Had more shortness of breath than is usual for you? no  4. Limited your activity because of shortness of breath? no  5. Not been able to sleep because of shortness of breath? no  6. Had increased swelling in your feet or ankles? no  7. Had symptoms of dehydration (dizziness, dry mouth, increased thirst, decreased urine output) no  8. Had changes in sodium restriction? no  9. Been compliant with medication? Yes   ICM trend: 10/10/2015   Follow-up plan: ICM clinic phone appointment 11/10/2015.  Optivol impedance trending close to baseline.  She denied any HF symptoms. She is wearing compression stockings.  No changes today.   Copy of note sent to patient's primary care physician, primary cardiologist, and device following physician.  Rosalene Billings, RN, CCM 10/10/2015 2:54 PM

## 2015-10-10 NOTE — Progress Notes (Signed)
Remote ICD transmission.   

## 2015-10-12 LAB — CUP PACEART REMOTE DEVICE CHECK
Brady Statistic AP VS Percent: 0.01 %
Brady Statistic AS VP Percent: 99.66 %
Brady Statistic AS VS Percent: 0.07 %
Brady Statistic RA Percent Paced: 0.28 %
Brady Statistic RV Percent Paced: 99.88 %
HighPow Impedance: 57 Ohm
Implantable Lead Implant Date: 20160120
Implantable Lead Implant Date: 20160120
Implantable Lead Location: 753859
Implantable Lead Location: 753860
Implantable Lead Model: 5076
Implantable Lead Model: 6935
Lead Channel Impedance Value: 342 Ohm
Lead Channel Impedance Value: 399 Ohm
Lead Channel Impedance Value: 399 Ohm
Lead Channel Impedance Value: 475 Ohm
Lead Channel Impedance Value: 551 Ohm
Lead Channel Impedance Value: 608 Ohm
Lead Channel Impedance Value: 722 Ohm
Lead Channel Impedance Value: 760 Ohm
Lead Channel Impedance Value: 779 Ohm
Lead Channel Pacing Threshold Amplitude: 0.5 V
Lead Channel Pacing Threshold Amplitude: 0.875 V
Lead Channel Pacing Threshold Pulse Width: 0.4 ms
Lead Channel Pacing Threshold Pulse Width: 0.4 ms
Lead Channel Pacing Threshold Pulse Width: 0.4 ms
Lead Channel Sensing Intrinsic Amplitude: 1 mV
Lead Channel Sensing Intrinsic Amplitude: 1 mV
Lead Channel Sensing Intrinsic Amplitude: 10.875 mV
Lead Channel Sensing Intrinsic Amplitude: 7.625 mV
Lead Channel Setting Pacing Amplitude: 1.5 V
Lead Channel Setting Pacing Amplitude: 2 V
Lead Channel Setting Pacing Amplitude: 2.5 V
Lead Channel Setting Pacing Pulse Width: 0.4 ms
Lead Channel Setting Pacing Pulse Width: 0.4 ms
MDC IDC LEAD IMPLANT DT: 20160120
MDC IDC LEAD LOCATION: 753858
MDC IDC LEAD MODEL: 4598
MDC IDC MSMT BATTERY REMAINING LONGEVITY: 78 mo
MDC IDC MSMT BATTERY VOLTAGE: 2.99 V
MDC IDC MSMT LEADCHNL LV IMPEDANCE VALUE: 361 Ohm
MDC IDC MSMT LEADCHNL LV IMPEDANCE VALUE: 475 Ohm
MDC IDC MSMT LEADCHNL LV IMPEDANCE VALUE: 589 Ohm
MDC IDC MSMT LEADCHNL RV IMPEDANCE VALUE: 456 Ohm
MDC IDC MSMT LEADCHNL RV PACING THRESHOLD AMPLITUDE: 0.625 V
MDC IDC SESS DTM: 20161011052404
MDC IDC SET LEADCHNL RV SENSING SENSITIVITY: 0.45 mV
MDC IDC SET ZONE DETECTION INTERVAL: 350 ms
MDC IDC STAT BRADY AP VP PERCENT: 0.27 %
Zone Setting Detection Interval: 300 ms

## 2015-10-26 ENCOUNTER — Encounter: Payer: Self-pay | Admitting: Cardiology

## 2015-10-27 ENCOUNTER — Encounter: Payer: Self-pay | Admitting: Cardiology

## 2015-11-10 ENCOUNTER — Encounter: Payer: Self-pay | Admitting: Cardiology

## 2015-11-10 ENCOUNTER — Ambulatory Visit (INDEPENDENT_AMBULATORY_CARE_PROVIDER_SITE_OTHER): Payer: Medicare Other

## 2015-11-10 DIAGNOSIS — Z95810 Presence of automatic (implantable) cardiac defibrillator: Secondary | ICD-10-CM | POA: Diagnosis not present

## 2015-11-10 DIAGNOSIS — I5022 Chronic systolic (congestive) heart failure: Secondary | ICD-10-CM | POA: Diagnosis not present

## 2015-11-10 NOTE — Progress Notes (Signed)
EPIC Encounter for ICM Monitoring  Patient Name: Carol Warren is a 69 y.o. female Date: 11/10/2015 Primary Care Physican: Ernestene Kiel, MD Primary Cardiologist: [place] Electrophysiologist: Lovena Le Dry Weight: 107 lbs       Bi-V Pacing 99.9%  In the past month, have you:  1. Gained more than 2 pounds in a day or more than 5 pounds in a week? no  2. Had changes in your medications (with verification of current medications)? no  3. Had more shortness of breath than is usual for you? no  4. Limited your activity because of shortness of breath? no  5. Not been able to sleep because of shortness of breath? no  6. Had increased swelling in your feet or ankles? no  7. Had symptoms of dehydration (dizziness, dry mouth, increased thirst, decreased urine output) no  8. Had changes in sodium restriction? no  9. Been compliant with medication? Yes   ICM trend: 11/10/2015   Follow-up plan: ICM clinic phone appointment on 01/18/2016 and appointment with Dr Lovena Le 12/19/2015.  Optivol daily thoracic impedance trending along baseline.  She reported she has sinus/respiratory infection and on her 3rd antibiotic. No symptoms of HF.  No changes today.     Copy of note sent to patient's primary care physician, primary cardiologist, and device following physician.  Rosalene Billings, RN, CCM 11/10/2015 9:39 AM

## 2015-11-16 ENCOUNTER — Telehealth: Payer: Self-pay | Admitting: Cardiology

## 2015-11-16 NOTE — Telephone Encounter (Signed)
Received records from [facility] for appointment on 11/27/15 with Dr Percival Spanish.  Records given to [facility] (medical records) for Dr Hochrein's schedule on 11/27/15. lp

## 2015-11-17 ENCOUNTER — Encounter: Payer: Self-pay | Admitting: Cardiology

## 2015-11-27 ENCOUNTER — Ambulatory Visit (INDEPENDENT_AMBULATORY_CARE_PROVIDER_SITE_OTHER): Payer: Medicare Other | Admitting: Cardiology

## 2015-11-27 ENCOUNTER — Encounter: Payer: Self-pay | Admitting: Cardiology

## 2015-11-27 VITALS — BP 140/74 | HR 98 | Ht 61.0 in | Wt 112.2 lb

## 2015-11-27 DIAGNOSIS — Z954 Presence of other heart-valve replacement: Secondary | ICD-10-CM | POA: Diagnosis not present

## 2015-11-27 DIAGNOSIS — Z952 Presence of prosthetic heart valve: Secondary | ICD-10-CM

## 2015-11-27 NOTE — Progress Notes (Signed)
HPI The patient presents for followup of aortic stenosis and reduced EF status post AVR.  She is now status post Bentall with bioprosthetic valve. 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   Since I last saw her she has had no acute cardiovascular complaints. She's not having any of the problems with her heart rate dropping with activity. She's continued to be limited by back pain. She is going to have spinal fusion sometime early next year. She denies any acute cardiovascular symptoms. She's had no chest pressure, neck or arm discomfort. She's had no new shortness of breath, PND or orthopnea. She's had no weight gain or edema.  Allergies  Allergen Reactions  . Oxycodone Nausea Only    Current Outpatient Prescriptions  Medication Sig Dispense Refill  . albuterol (PROVENTIL HFA;VENTOLIN HFA) 108 (90 BASE) MCG/ACT inhaler Inhale 1-2 puffs into the lungs every 6 (six) hours as needed for wheezing or shortness of breath.    [name] albuterol (PROVENTIL) (2.5 MG/3ML) 0.083% nebulizer solution Take 2.5 mg by nebulization 2 (two) times daily as needed for wheezing.    . AMBULATORY NON FORMULARY MEDICATION Topical nasal rinse/ Tobram Budesonide Use nasal rinse daily as needed for dryness    . aspirin EC 325 MG EC tablet Take 1 tablet (325 mg total) by mouth daily. 30 tablet 0  . B Complex Vitamins (VITAMIN B COMPLEX PO) Take 1 tablet by mouth daily. ON HOLD UNTIL 02/20/15    . buPROPion (WELLBUTRIN XL) 300 MG 24 hr tablet Take 300 mg by mouth daily.    . Calcium Carbonate-Vitamin D (CALCIUM-D PO) Take 1 tablet by mouth daily.     . carvedilol (COREG) 12.5 MG tablet Take 1 tablet (12.5 mg total) by mouth 2 (two) times daily with a meal. 60 tablet 6  . cyanocobalamin (,VITAMIN B-12,) 1000 MCG/ML injection Inject 1,000 mcg into the muscle once a week. ON HOLD UNTIL 02/20/15    . diazepam (VALIUM) 10 MG tablet Take 10 mg by mouth 3 (three) times daily as needed for anxiety.    . fluticasone  (FLONASE) 50 MCG/ACT nasal spray Place 1 spray into the nose 2 (two) times daily.     . Halobetasol-Ammonium Lactate 0.05 & 12 % (CREAM) KIT Apply 1 application topically daily as needed (dryness).    [name] HYDROcodone-acetaminophen (NORCO/VICODIN) 5-325 MG per tablet Take 1 tablet by mouth every 6 (six) hours as needed for moderate pain or severe pain. 40 tablet 0  . Linaclotide (LINZESS) 290 MCG CAPS capsule Take 290 mcg by mouth daily.    [name] loratadine (CLARITIN) 10 MG tablet Take 10 mg by mouth daily.    [name] losartan (COZAAR) 25 MG tablet TAKE 1 TABLET BY MOUTH DAILY. 30 tablet 11  . meloxicam (MOBIC) 15 MG tablet Take 15 mg by mouth daily as needed for pain.    . Menthol, Topical Analgesic, (ICY HOT EX) Apply 1 patch topically daily.    . montelukast (SINGULAIR) 10 MG tablet Take 10 mg by mouth at bedtime.    . Multiple Vitamin (MULTIVITAMIN) capsule Take 1 capsule by mouth daily.    . mupirocin ointment (BACTROBAN) 2 % Apply 1 application topically 2 (two) times daily.    . nicotine (NICODERM CQ - DOSED IN MG/24 HOURS) 21 mg/24hr patch Place 1 patch (21 mg total) onto the skin daily. 28 patch 0  . NON FORMULARY Buminate ophth sol into the eyes two times a day    . Olopatadine  HCl (PATANASE) 0.6 % SOLN Place 2 drops into both nostrils 2 (two) times daily.     [name] omeprazole (PRILOSEC) 20 MG capsule Take 40 mg by mouth daily.    . Probiotic Product (PROBIOTIC DAILY PO) Take 1 capsule by mouth daily. Ultimate flora    . pseudoephedrine-guaifenesin (MUCINEX D) 60-600 MG per tablet Take 1 tablet by mouth daily as needed for congestion.     . QUEtiapine (SEROQUEL) 25 MG tablet Take 25 mg by mouth at bedtime.    . ranitidine (ZANTAC) 300 MG capsule Take 300 mg by mouth every evening.    . Saline (SIMPLY SALINE) 0.9 % AERS Place 2 sprays into both nostrils 2 (two) times daily.     No current facility-administered medications for this visit.    Past Medical History  Diagnosis Date  . 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 Osteopenia   . HSV-1 (herpes simplex virus 1) infection   . HSV-2 (herpes simplex virus 2) infection   . Bicuspid aortic valve     CONGENITAL  . Aortic aneurysm ([place])   . Adrenal tumor   . Degenerative disc disease     CERVICAL AND LUMBAR (DR. Lorin Mercy)  . Smoker   . Anxiety   . Chronic sinusitis   . Chronic depression   . Osteoarthritis   . Pernicious anemia   . Insomnia   . Asthma   . Chronic fatigue   . Vitamin B 12 deficiency   . Plantar fasciitis   . Hammer toe of right foot   . Hepatomegaly   . PONV (postoperative nausea and vomiting)   . Shortness of breath dyspnea   . COPD (chronic obstructive pulmonary disease) ([address] [place] [address])   . GERD (gastroesophageal reflux disease)   . Carpal tunnel syndrome, bilateral     Past Surgical History  Procedure Laterality Date  . Excision of vulvar cis    . Sinus procedure  2009    x 6  . Excision of basal cell ca      SKIN   . Cataract extraction Bilateral     X2  . Carpal tunnel release Bilateral 2008  . Bunionectomy with hammertoe reconstruction Right   . Colonoscopy    . Upper gastrointestinal endoscopy    . Tee without cardioversion N/A 10/06/2014    Procedure: TRANSESOPHAGEAL ECHOCARDIOGRAM (TEE);  Surgeon: Sueanne Margarita, MD;  Location: [facility] ENDOSCOPY;  Service: Cardiovascular;  Laterality: N/A;  . Left and right heart catheterization with coronary angiogram N/A 09/23/2014    Procedure: LEFT AND RIGHT HEART CATHETERIZATION WITH CORONARY ANGIOGRAM;  Surgeon: Minus Breeding, MD;  Location: [facility] CATH LAB;  Service: Cardiovascular;  Laterality: N/A;  . Aortic valve replacement N/A 01/12/2015    Procedure: AORTIC VALVE REPLACEMENT (AVR);  Surgeon: Gaye Pollack, MD;  Location: [place];  Service: Open Heart Surgery;  Laterality: N/A;  . Ascending aortic root replacement N/A 01/12/2015    Procedure: ASCENDING AORTIC ROOT REPLACEMENT;  Surgeon: Gaye Pollack, MD;  Location: [place];  Service: Open Heart Surgery;   Laterality: N/A;  . Tee without cardioversion N/A 01/12/2015    Procedure: TRANSESOPHAGEAL ECHOCARDIOGRAM (TEE);  Surgeon: Gaye Pollack, MD;  Location: [place];  Service: Open Heart Surgery;  Laterality: N/A;  . Bi-ventricular pacemaker insertion N/A 01/18/2015    Procedure: BI-VENTRICULAR PACEMAKER INSERTION (CRT-P);  Surgeon: Evans Lance, MD;  Location: [facility] CATH LAB;  Service: Cardiovascular;  Laterality: N/A;    ROS:  As stated in the HPI and negative for all other systems.  PHYSICAL EXAM BP 140/74 mmHg  Pulse 98  Ht 5' 1"  (1.549 m)  Wt 112 lb 3 oz (50.888 kg)  BMI 21.21 kg/m2 GENERAL:  Frail appearing and looking older than her stated age. NECK:  No jugular venous distention, waveform within normal limits, carotid upstroke brisk and symmetric, no bruits, no thyromegaly LUNGS:  Clear to auscultation bilaterally BACK:  No CVA tenderness CHEST:  Well healed sternotomy scar.   HEART:  PMI not displaced or sustained,S1 and S2 within normal limits, no S3, no S4, no clicks, no rubs, 2/6 apical systolic murmur also at the right upper sternal border, early peaking, no diastolic murmurs ABD:  Flat, positive bowel sounds normal in frequency in pitch, no bruits, no rebound, no guarding, no midline pulsatile mass, no hepatomegaly, no splenomegaly EXT:  Mildly diminished bilateral dorsalis pedis and posterior tibialis plus pulses throughout, no edema, no cyanosis no clubbing    ASSESSMENT AND PLAN  AVR:  The patient's status post valve replacement and Bentall.  We will follow this clinically.  TOBACCO ABUSE: She still smoking and we again discussed the need to stop.  CARDIOMYOPATHY:   She tolerated the higher dose of beta blocker and she will remain on this. She seems to be euvolemic. No change in therapy is indicating.  BACK PAIN:  The patient would be at acceptable risk for planned back surgery. She would be able to stop her aspirin as necessary prior to the procedure.  ICD:  Bi V  ICD-Medtronic-Optivol-Carelink.  She is up-to-date with follow-up and will need usual creatinine postoperative care.

## 2015-11-27 NOTE — Patient Instructions (Signed)
Your physician wants you to follow-up in: 6 Months You will receive a reminder letter in the mail two months in advance. If you don't receive a letter, please call our office to schedule the follow-up appointment.  

## 2015-12-07 ENCOUNTER — Telehealth: Payer: Self-pay

## 2015-12-07 NOTE — Telephone Encounter (Signed)
Returned call and spoke with patient.  She asked if she should be recording daily weights.  Advised that recording daily weight will help monitor for fluid weight gain such as 2-3 lb overnight and 5 pounds in a week. She stated she thought she may have strained a muscle when she picked up her dog that weighs 30 lbs.  She stated she is fine now.  Her weight is 110 lbs.  Patient is in [facility] HF program and weighs daily.  No changes today.    Received voice mail message to return call.

## 2015-12-19 ENCOUNTER — Encounter: Payer: Medicare Other | Admitting: Internal Medicine

## 2016-01-17 ENCOUNTER — Other Ambulatory Visit: Payer: Self-pay | Admitting: Specialist

## 2016-01-17 DIAGNOSIS — G8929 Other chronic pain: Secondary | ICD-10-CM

## 2016-01-17 DIAGNOSIS — M545 Low back pain: Principal | ICD-10-CM

## 2016-01-18 ENCOUNTER — Ambulatory Visit (INDEPENDENT_AMBULATORY_CARE_PROVIDER_SITE_OTHER): Payer: Medicare Other

## 2016-01-18 ENCOUNTER — Telehealth: Payer: Self-pay | Admitting: Cardiology

## 2016-01-18 DIAGNOSIS — I5022 Chronic systolic (congestive) heart failure: Secondary | ICD-10-CM | POA: Diagnosis not present

## 2016-01-18 DIAGNOSIS — Z95810 Presence of automatic (implantable) cardiac defibrillator: Secondary | ICD-10-CM

## 2016-01-18 NOTE — Telephone Encounter (Signed)
OK to have study as needed.

## 2016-01-18 NOTE — Telephone Encounter (Signed)
Pt advised OK for recommended tests, she voiced understanding.

## 2016-01-18 NOTE — Telephone Encounter (Signed)
Returned call. Ms Momon wanted to make sure myelogram will be OK to proceed with since procedure involves a contrast medium.  Advised probably fine, will route to Dr. Percival Spanish for confirmation. Pt aware. No other questions at this time.

## 2016-01-18 NOTE — Telephone Encounter (Signed)
Pt called in wanting to inform Dr. Percival Spanish that she will be having a Myelogram on 2/7 for a surgery she has coming up. She just wanted to make sure that Dr. Percival Spanish approved of her having this done considering her heart condition. Please f/u with her  Thanks

## 2016-01-22 ENCOUNTER — Telehealth: Payer: Self-pay | Admitting: Cardiology

## 2016-01-22 NOTE — Telephone Encounter (Signed)
Wants To know if Dr. Percival Spanish can clear her for surgery because she has a cyst in her throat . She last saw Dr. Percival Spanish in 10/2015.[name] Please fax over clearance to [facility] , Dr. Cletis Athens and the telephone # is [phone]. Please call   Thanks

## 2016-01-22 NOTE — Telephone Encounter (Signed)
Will forward for dr hochrein's review

## 2016-01-22 NOTE — Progress Notes (Signed)
EPIC Encounter for ICM Monitoring  Patient Name: Carol Warren is a 70 y.o. female Date: 01/22/2016 Primary Care Physican: Ernestene Kiel, MD Primary Cardiologist: [place] Electrophysiologist: Lovena Le Dry Weight: 105 lbs  Bi-V Pacing 99.9%       In the past month, have you:  1. Gained more than 2 pounds in a day or more than 5 pounds in a week? no  2. Had changes in your medications (with verification of current medications)? no  3. Had more shortness of breath than is usual for you? no  4. Limited your activity because of shortness of breath? no  5. Not been able to sleep because of shortness of breath? no  6. Had increased swelling in your feet or ankles? no  7. Had symptoms of dehydration (dizziness, dry mouth, increased thirst, decreased urine output) no  8. Had changes in sodium restriction? no  9. Been compliant with medication? Yes   ICM trend: 3 month view for 01/18/2016   ICM trend: 1 year view for 01/18/2016   Follow-up plan: ICM clinic phone appointment on 03/07/2016 and office appointment with Dr Lovena Le on 02/05/2016.  Optivol thoracic impedance trending close to reference line.  She denied any HF symptoms and wearing compression stockings.  She will be having a myleogram on 02/07/2016 to determine if she needs a spine fusion.   No changes today.   She is in [place] Failure program.   Copy of note sent to patient's primary care physician, primary cardiologist, and device following physician.  Rosalene Billings, RN, CCM 01/22/2016 8:41 AM

## 2016-01-25 NOTE — Telephone Encounter (Signed)
The patient has no cardiovascular contraindication to the planned procedure.

## 2016-01-26 NOTE — Telephone Encounter (Signed)
Spoke with pt,  aware of dr hochrein's recommendations. Will fax this note to confirmed fax number [phone].

## 2016-02-05 ENCOUNTER — Encounter: Payer: Self-pay | Admitting: Internal Medicine

## 2016-02-05 ENCOUNTER — Ambulatory Visit (INDEPENDENT_AMBULATORY_CARE_PROVIDER_SITE_OTHER): Payer: Medicare Other | Admitting: Internal Medicine

## 2016-02-05 VITALS — BP 142/80 | HR 106 | Ht 61.5 in | Wt 112.0 lb

## 2016-02-05 DIAGNOSIS — Z95810 Presence of automatic (implantable) cardiac defibrillator: Secondary | ICD-10-CM

## 2016-02-05 DIAGNOSIS — F17200 Nicotine dependence, unspecified, uncomplicated: Secondary | ICD-10-CM

## 2016-02-05 DIAGNOSIS — I5022 Chronic systolic (congestive) heart failure: Secondary | ICD-10-CM

## 2016-02-05 DIAGNOSIS — Z720 Tobacco use: Secondary | ICD-10-CM | POA: Diagnosis not present

## 2016-02-05 LAB — CUP PACEART INCLINIC DEVICE CHECK
Brady Statistic AP VP Percent: 0.35 %
Brady Statistic AP VS Percent: 0.01 %
Brady Statistic AS VP Percent: 99.58 %
Brady Statistic AS VS Percent: 0.06 %
Date Time Interrogation Session: 20170206124034
HIGH POWER IMPEDANCE MEASURED VALUE: 70 Ohm
Implantable Lead Implant Date: 20160120
Implantable Lead Implant Date: 20160120
Implantable Lead Location: 753859
Lead Channel Impedance Value: 361 Ohm
Lead Channel Impedance Value: 418 Ohm
Lead Channel Impedance Value: 456 Ohm
Lead Channel Impedance Value: 456 Ohm
Lead Channel Impedance Value: 836 Ohm
Lead Channel Pacing Threshold Amplitude: 0.5 V
Lead Channel Pacing Threshold Amplitude: 0.75 V
Lead Channel Pacing Threshold Pulse Width: 0.4 ms
Lead Channel Sensing Intrinsic Amplitude: 1.875 mV
Lead Channel Setting Pacing Amplitude: 2.5 V
Lead Channel Setting Pacing Pulse Width: 0.4 ms
Lead Channel Setting Sensing Sensitivity: 0.45 mV
MDC IDC LEAD IMPLANT DT: 20160120
MDC IDC LEAD LOCATION: 753858
MDC IDC LEAD LOCATION: 753860
MDC IDC LEAD MODEL: 4598
MDC IDC LEAD MODEL: 6935
MDC IDC MSMT BATTERY REMAINING LONGEVITY: 75 mo
MDC IDC MSMT BATTERY VOLTAGE: 2.98 V
MDC IDC MSMT LEADCHNL LV IMPEDANCE VALUE: 513 Ohm
MDC IDC MSMT LEADCHNL LV IMPEDANCE VALUE: 532 Ohm
MDC IDC MSMT LEADCHNL LV IMPEDANCE VALUE: 665 Ohm
MDC IDC MSMT LEADCHNL LV IMPEDANCE VALUE: 665 Ohm
MDC IDC MSMT LEADCHNL LV IMPEDANCE VALUE: 817 Ohm
MDC IDC MSMT LEADCHNL LV IMPEDANCE VALUE: 874 Ohm
MDC IDC MSMT LEADCHNL LV PACING THRESHOLD PULSEWIDTH: 0.4 ms
MDC IDC MSMT LEADCHNL RA PACING THRESHOLD AMPLITUDE: 0.5 V
MDC IDC MSMT LEADCHNL RA PACING THRESHOLD PULSEWIDTH: 0.4 ms
MDC IDC MSMT LEADCHNL RV IMPEDANCE VALUE: 513 Ohm
MDC IDC MSMT LEADCHNL RV IMPEDANCE VALUE: 589 Ohm
MDC IDC MSMT LEADCHNL RV SENSING INTR AMPL: 10.875 mV
MDC IDC SET LEADCHNL RA PACING AMPLITUDE: 1.5 V
MDC IDC SET LEADCHNL RV PACING AMPLITUDE: 2 V
MDC IDC SET LEADCHNL RV PACING PULSEWIDTH: 0.4 ms
MDC IDC STAT BRADY RA PERCENT PACED: 0.35 %
MDC IDC STAT BRADY RV PERCENT PACED: 99.89 %

## 2016-02-05 NOTE — Patient Instructions (Signed)
Medication Instructions:  Your physician recommends that you continue on your current medications as directed. Please refer to the Current Medication list given to you today.  Labwork: None ordered  Testing/Procedures: None ordered  Follow-Up: Remote monitoring is used to monitor your Pacemaker of ICD from home. This monitoring reduces the number of office visits required to check your device to one time per year. It allows [place] to keep an eye on the functioning of your device to ensure it is working properly. You are scheduled for a device check from home on 05/06/2016. You may send your transmission at any time that day. If you have a wireless device, the transmission will be sent automatically. After your physician reviews your transmission, you will receive a postcard with your next transmission date.  Your physician wants you to follow-up in: 1 year with Dr. Lovena Le. You will receive a reminder letter in the mail two months in advance. If you don't receive a letter, please call our office to schedule the follow-up appointment.  If you need a refill on your cardiac medications before your next appointment, please call your pharmacy.  Thank you for choosing [facility]!!

## 2016-02-05 NOTE — Assessment & Plan Note (Signed)
Her symptoms are class 2. She will continue her current meds. She is limited by arthritis.

## 2016-02-05 NOTE — Assessment & Plan Note (Signed)
Her medtronic biV ICD is working normally. Will recheck in several months.

## 2016-02-05 NOTE — Assessment & Plan Note (Signed)
She currently denies tobacco use.

## 2016-02-05 NOTE — Progress Notes (Signed)
HPI Carol Warren returns today for followup. She is a pleasant 70 yo woman with a h/o chronic systolic heart failure and complete heart block who is s/p PPM insertion. She was found at cardiac rehab to have intermittant failure to pace in the ventricle. She feels well but has not been allowed back into cardiac rehab. No other complaints today.  Allergies  Allergen Reactions  . Oxycodone Nausea Only     Current Outpatient Prescriptions  Medication Sig Dispense Refill  . albuterol (PROVENTIL HFA;VENTOLIN HFA) 108 (90 BASE) MCG/ACT inhaler Inhale 1-2 puffs into the lungs every 6 (six) hours as needed for wheezing or shortness of breath.    [name] albuterol (PROVENTIL) (2.5 MG/3ML) 0.083% nebulizer solution Take 2.5 mg by nebulization 2 (two) times daily as needed for wheezing.    . AMBULATORY NON FORMULARY MEDICATION Topical nasal rinse/ Tobram Budesonide Use nasal rinse daily as needed for dryness    . amoxicillin-clavulanate (AUGMENTIN) 875-125 MG tablet Take 1 tablet by mouth daily.    [name] aspirin EC 325 MG EC tablet Take 1 tablet (325 mg total) by mouth daily. 30 tablet 0  . B Complex Vitamins (VITAMIN B COMPLEX PO) Take 1 tablet by mouth daily. ON HOLD UNTIL 02/20/15    . Calcium Carbonate-Vitamin D (CALCIUM-D PO) Take 1 tablet by mouth daily.     . carvedilol (COREG) 12.5 MG tablet Take 1 tablet (12.5 mg total) by mouth 2 (two) times daily with a meal. 60 tablet 6  . cyanocobalamin (,VITAMIN B-12,) 1000 MCG/ML injection Inject 1,000 mcg into the muscle once a week. ON HOLD UNTIL 02/20/15    . diazepam (VALIUM) 10 MG tablet Take 10 mg by mouth 3 (three) times daily as needed for anxiety.    [name] FLUoxetine (PROZAC) 20 MG capsule Take 20 mg by mouth daily.    . fluticasone (FLONASE) 50 MCG/ACT nasal spray Place 1 spray into the nose 2 (two) times daily.     . Halobetasol-Ammonium Lactate 0.05 & 12 % (CREAM) KIT Apply 1 application topically daily as needed (dryness).    [name]  HYDROcodone-acetaminophen (NORCO/VICODIN) 5-325 MG per tablet Take 1 tablet by mouth every 6 (six) hours as needed for moderate pain or severe pain. 40 tablet 0  . loratadine (CLARITIN) 10 MG tablet Take 10 mg by mouth daily.    [name] losartan (COZAAR) 25 MG tablet TAKE 1 TABLET BY MOUTH DAILY. 30 tablet 11  . meloxicam (MOBIC) 15 MG tablet Take 15 mg by mouth daily as needed for pain.    . Menthol, Topical Analgesic, (ICY HOT EX) Apply 1 patch topically daily.    . montelukast (SINGULAIR) 10 MG tablet Take 10 mg by mouth at bedtime.    . Multiple Vitamin (MULTIVITAMIN) capsule Take 1 capsule by mouth daily.    . nicotine (NICODERM CQ - DOSED IN MG/24 HOURS) 21 mg/24hr patch Place 1 patch (21 mg total) onto the skin daily. 28 patch 0  . NON FORMULARY Buminate ophth sol into the eyes two times a day    . Olopatadine HCl (PATANASE) 0.6 % SOLN Place 2 drops into both nostrils 2 (two) times daily.     [name] omeprazole (PRILOSEC) 20 MG capsule Take 40 mg by mouth daily.    . Probiotic Product (PROBIOTIC DAILY PO) Take 1 capsule by mouth daily. Ultimate flora    . pseudoephedrine-guaifenesin (MUCINEX D) 60-600 MG per tablet Take 1 tablet by mouth daily as needed for congestion.     [name]  ranitidine (ZANTAC) 300 MG capsule Take 300 mg by mouth every evening.    . Saline (SIMPLY SALINE) 0.9 % AERS Place 2 sprays into both nostrils 2 (two) times daily.     No current facility-administered medications for this visit.     Past Medical History  Diagnosis Date  . 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 Osteopenia   . HSV-1 (herpes simplex virus 1) infection   . HSV-2 (herpes simplex virus 2) infection   . Bicuspid aortic valve     CONGENITAL  . Aortic aneurysm ([place])   . Adrenal tumor   . Degenerative disc disease     CERVICAL AND LUMBAR (DR. Lorin Mercy)  . Smoker   . Anxiety   . Chronic sinusitis   . Chronic depression   . Osteoarthritis   . Pernicious anemia   . Insomnia   . Asthma   . Chronic  fatigue   . Vitamin B 12 deficiency   . Plantar fasciitis   . Hammer toe of right foot   . Hepatomegaly   . PONV (postoperative nausea and vomiting)   . Shortness of breath dyspnea   . COPD (chronic obstructive pulmonary disease) ([place])   . GERD (gastroesophageal reflux disease)   . Carpal tunnel syndrome, bilateral     ROS:   All systems reviewed and negative except as noted in the HPI.   Past Surgical History  Procedure Laterality Date  . Excision of vulvar cis    . Sinus procedure  2009    x 6  . Excision of basal cell ca      SKIN   . Cataract extraction Bilateral     X2  . Carpal tunnel release Bilateral 2008  . Bunionectomy with hammertoe reconstruction Right   . Colonoscopy    . Upper gastrointestinal endoscopy    . Tee without cardioversion N/A 10/06/2014    Procedure: TRANSESOPHAGEAL ECHOCARDIOGRAM (TEE);  Surgeon: Sueanne Margarita, MD;  Location: [facility] ENDOSCOPY;  Service: Cardiovascular;  Laterality: N/A;  . Left and right heart catheterization with coronary angiogram N/A 09/23/2014    Procedure: LEFT AND RIGHT HEART CATHETERIZATION WITH CORONARY ANGIOGRAM;  Surgeon: Minus Breeding, MD;  Location: [facility] CATH LAB;  Service: Cardiovascular;  Laterality: N/A;  . Aortic valve replacement N/A 01/12/2015    Procedure: AORTIC VALVE REPLACEMENT (AVR);  Surgeon: Gaye Pollack, MD;  Location: [place];  Service: Open Heart Surgery;  Laterality: N/A;  . Ascending aortic root replacement N/A 01/12/2015    Procedure: ASCENDING AORTIC ROOT REPLACEMENT;  Surgeon: Gaye Pollack, MD;  Location: [place];  Service: Open Heart Surgery;  Laterality: N/A;  . Tee without cardioversion N/A 01/12/2015    Procedure: TRANSESOPHAGEAL ECHOCARDIOGRAM (TEE);  Surgeon: Gaye Pollack, MD;  Location: [place];  Service: Open Heart Surgery;  Laterality: N/A;  . Bi-ventricular pacemaker insertion N/A 01/18/2015    Procedure: BI-VENTRICULAR PACEMAKER INSERTION (CRT-P);  Surgeon: Evans Lance, MD;  [facility] CATH  LAB;  Service: Cardiovascular;  Laterality: N/A;     Family History  Problem Relation Age of Onset  . Pancreatic cancer Mother 72  . Diabetes Father   . Hypertension Father   . Heart disease Father 35    CAD  . Breast cancer Sister   . Diabetes Maternal Grandmother   . Hypertension Maternal Grandmother   . Cancer Maternal Grandfather     colon or stomach  . Hypertension Paternal Grandmother   . Heart disease  Paternal Grandmother     Later onset  . Asthma Grandchild   . Diverticulosis Paternal Grandmother      Social History   Social History  . Marital Status: Married    Spouse Name: N/A  . Number of Children: 2  . Years of Education: N/A   Occupational History  . retired    Social History Main Topics  . Smoking status: Current Every Day Smoker -- 1.00 packs/day for 50 years    Types: Cigarettes  . Smokeless tobacco: Never Used     Comment: uses vapor cig  . Alcohol Use: No  . Drug Use: No  . Sexual Activity: Not on file   Other Topics Concern  . Not on file   Social History Narrative     BP 142/80 mmHg  Pulse 106  Ht 5' 1.5" (1.562 m)  Wt 112 lb (50.803 kg)  BMI 20.82 kg/m2  Physical Exam:  Well appearing 70 yo woman, NAD HEENT: Unremarkable Neck:  6 cm JVD, no thyromegally Back:  No CVA tenderness Lungs:  Clear with no wheezes HEART:  Regular rate rhythm, no murmurs, no rubs, no clicks Abd:  soft, positive bowel sounds, no organomegally, no rebound, no guarding Ext:  2 plus pulses, no edema, no cyanosis, no clubbing Skin:  No rashes no nodules Neuro:  CN II through XII intact, motor grossly intact  DEVICE  Normal device function.  See PaceArt for details.   Assess/Plan:

## 2016-02-07 ENCOUNTER — Other Ambulatory Visit: Payer: Medicare Other

## 2016-02-16 ENCOUNTER — Telehealth: Payer: Self-pay | Admitting: Internal Medicine

## 2016-02-16 NOTE — Telephone Encounter (Signed)
Minus Breeding, MD at 01/25/2016 11:03 PM     Status: Signed       Expand All Collapse All   The patient has no cardiovascular contraindication to the planned procedure.        Will refax this to MD

## 2016-02-16 NOTE — Telephone Encounter (Signed)
Request for surgical clearance:  1. What type of surgery is being performed? Microscopic Direct Laryngoscopy with Biopsy of a cyst -about 45 minutes under general  2. When is this surgery scheduled? 03-05-16   3. Are there any medications that need to be held prior to surgery and how long?no-unless she is on a blood thinner or aspirin  4. Name of physician performing surgery? Dr Gaylyn Cheers   5. What is your office phone and fax number? [phone]  6.

## 2016-02-18 ENCOUNTER — Encounter: Payer: Self-pay | Admitting: Cardiology

## 2016-02-20 ENCOUNTER — Encounter: Payer: Self-pay | Admitting: Cardiology

## 2016-03-01 ENCOUNTER — Telehealth: Payer: Self-pay | Admitting: Internal Medicine

## 2016-03-01 NOTE — Telephone Encounter (Signed)
She is still waiting for clearance that was called on

## 2016-03-01 NOTE — Telephone Encounter (Signed)
She said she was still waiting for clearance that was called in on 02-16-16.

## 2016-03-01 NOTE — Telephone Encounter (Signed)
Previous phone note states clearance has been sent will forward to Dr. Tanna Furry nurse.

## 2016-03-04 NOTE — Telephone Encounter (Signed)
lmom for office that the clearance was faxed back in February to proceed if necessary.  I will refax today

## 2016-03-05 ENCOUNTER — Other Ambulatory Visit: Payer: Self-pay

## 2016-03-07 ENCOUNTER — Ambulatory Visit (INDEPENDENT_AMBULATORY_CARE_PROVIDER_SITE_OTHER): Payer: Medicare Other

## 2016-03-07 DIAGNOSIS — Z95810 Presence of automatic (implantable) cardiac defibrillator: Secondary | ICD-10-CM

## 2016-03-07 DIAGNOSIS — I5022 Chronic systolic (congestive) heart failure: Secondary | ICD-10-CM

## 2016-03-08 NOTE — Progress Notes (Signed)
EPIC Encounter for ICM Monitoring  Patient Name: Carol Warren is a 70 y.o. female Date: 03/08/2016 Primary Care Physican: Ernestene Kiel, MD Primary Cardiologist: [place] Electrophysiologist: Lovena Le Dry Weight: 109 lbs  Bi-V Pacing 100%       In the past month, have you:  1. Gained more than 2 pounds in a day or more than 5 pounds in a week? no  2. Had changes in your medications (with verification of current medications)? no  3. Had more shortness of breath than is usual for you? no  4. Limited your activity because of shortness of breath? no  5. Not been able to sleep because of shortness of breath? no  6. Had increased swelling in your feet or ankles? no  7. Had symptoms of dehydration (dizziness, dry mouth, increased thirst, decreased urine output) no  8. Had changes in sodium restriction? no  9. Been compliant with medication? Yes   ICM trend: 3 month view for 03/07/2016   ICM trend: 1 year view for 03/07/2016   Follow-up plan: ICM clinic phone appointment on 04/09/2016.   Optivol thoracic impedance below reference line starting 02/01/2016 suggesting fluid accumulation.  She denied any fluid symptoms.  Discussed foods that she normally eats which are frozen dinners such as Banquet and [employer] meals.  She does not check food labels and requested she check today for sodium content which 1 serving = 1100 mg sodium.  She also eats some restaurant foods such as biscuits and sausage gravy.  Advised to follow low sodium diet and limit to <2000 mg daily.   Encouraged her read her labels to estimate amount of sodium intake.  Patient currently not prescribed a diuretic.    Advised will send to Dr Percival Spanish and Dr Lovena Le for review and if any recommendations will call her back.    Copy of note sent to patient's primary care physician, primary cardiologist, and device following physician.  Rosalene Billings, RN, CCM 03/08/2016 10:36 AM

## 2016-03-16 ENCOUNTER — Encounter: Payer: Self-pay | Admitting: Cardiology

## 2016-03-20 ENCOUNTER — Encounter: Payer: Self-pay | Admitting: Cardiology

## 2016-03-26 ENCOUNTER — Telehealth: Payer: Self-pay | Admitting: Cardiology

## 2016-03-26 NOTE — Telephone Encounter (Signed)
She could be on Lasix 20 mg po daily for 3 days only.  Please call in 15 pills.  No refills.

## 2016-03-26 NOTE — Telephone Encounter (Addendum)
Pt c/o swelling, w 5 lb wt gain over approx 1 month. Not currently on diuretic. C/o slight SOB on exertion.  Fax pending from RN case manager, Caryl Pina, who tracks pt's weights remotely daily.  I called patient. She had a recent day surgery to remove a mass in throat and a procedure on sinuses. She has had weight gain, states "I'm kicking myself in the butt", she was at home mostly for the last month & notes husband had been bringing her a gravy biscuit every morning. Other than this, she states she does not eat a lot of junk food. She states she has laid off the biscuits because she felt bad about eating them. [employer] RN does not think her weight gain is due to this.  Patient reports a little swelling on her legs, wearing compression stockings to remediate this. She notes she's a little SOB but attributes this to her sinus problems. No orthopnea.  Pt would like to try a diuretic for a few days or as indicated by physician - she has been on furosemide in the past for short course. She does not want to be on anything long term but would like "to nip this in the bud" prior to next appt. Sees Dr. Percival Spanish for appt in 1 month.

## 2016-03-26 NOTE — Telephone Encounter (Signed)
New message      Pt c/o swelling: STAT is pt has developed SOB within 24 hours  1. How long have you been experiencing swelling? Not sure when it started but pt has gained 5 lbs over approx 30 days.  2. Where is the swelling located?  Abd, hands, feet 3.  Are you currently taking a "fluid pill"? no 4.  Are you currently SOB? Little on exertion 5.  Have you traveled recently? No [employer] nurse is faxing this info to Dr Percival Spanish.  Please call pt and nurse will follow up with her

## 2016-03-27 MED ORDER — FUROSEMIDE 20 MG PO TABS: 20.0000 mg | ORAL_TABLET | Freq: Every day | ORAL | Status: DC

## 2016-03-27 NOTE — Telephone Encounter (Signed)
Follow up      Patient told nurse that no one has called her back regarding swelling and weight gain.  Please call pt and let her know what Dr Percival Spanish said

## 2016-03-27 NOTE — Telephone Encounter (Signed)
Eustace Pen with [employer] back and left detail message on her secure pt VM.  Pt RX sent to pharmacy for pickup.  Spoke with pt she aware, no additional questions at this time.

## 2016-04-02 ENCOUNTER — Telehealth: Payer: Self-pay | Admitting: Cardiology

## 2016-04-02 NOTE — Telephone Encounter (Signed)
Returned call. Pt informs that she saw Dr. Laqueta Due today for primary wellness visit. She mentioned the issues she'd been having w/ peripheral edema. Noted the '20mg'$  lasix daily didn't seem to be providing relief of symptoms.  Dr. Laqueta Due increased her dose to '40mg'$  daily for next 2 days and advised to call us. I affirmed this was sensible advice. Recommended to follow PCP instructions and see if this providers relief of edema in feet and abdomen.  Pt to call back Friday w/ update. We will discuss further at that time.

## 2016-04-02 NOTE — Telephone Encounter (Signed)
NEW MESSAGE   Pt calling to update Dr.Hochrein   Lasix '20mg'$  pt took it for 3 days and nothing helped   Pt went to PCP and she seen swelling in legs hands and abdomen and told pt to up the dosage to 40 mg

## 2016-04-05 ENCOUNTER — Telehealth: Payer: Self-pay | Admitting: Cardiology

## 2016-04-05 NOTE — Telephone Encounter (Signed)
I will consider an echo when I see her in the office.

## 2016-04-05 NOTE — Telephone Encounter (Signed)
Pt's med was increased and was to call back today to let us know how she is doing-pls call

## 2016-04-05 NOTE — Telephone Encounter (Signed)
Had a pleasant conversation w/ Carol Warren. She notes her lasix was increased last week by Dr. Laqueta Due to '40mg'$  daily. This was to address slight fatigue & chronic BLE swelling which is more pronounced in left leg. Pt notes no real improvement, but not worse either.  She is avoiding salt and using compression stockings. I recommended leg elevation at rest & acknowledged compliance otherwise.  She notes Dr. Louanne Skye is setting her up for myelogram - consult scheduled in 2 weeks. She sees Dr. Percival Spanish on 5/2 for f/u.   Noted I would defer to Dr. Percival Spanish for any recommendations regarding HF symptoms - would a 2d Echo be appropriate? She has not had one recently.

## 2016-04-08 ENCOUNTER — Other Ambulatory Visit: Payer: Self-pay | Admitting: Specialist

## 2016-04-08 NOTE — Telephone Encounter (Signed)
Pt aware of recommendations, aware to call if any needs.

## 2016-04-09 ENCOUNTER — Ambulatory Visit (INDEPENDENT_AMBULATORY_CARE_PROVIDER_SITE_OTHER): Payer: Medicare Other

## 2016-04-09 DIAGNOSIS — Z95810 Presence of automatic (implantable) cardiac defibrillator: Secondary | ICD-10-CM

## 2016-04-09 DIAGNOSIS — I5022 Chronic systolic (congestive) heart failure: Secondary | ICD-10-CM

## 2016-04-09 NOTE — Progress Notes (Signed)
EPIC Encounter for ICM Monitoring  Patient Name: Carol Warren is a 70 y.o. female Date: 04/09/2016 Primary Care Physican: Ernestene Kiel, MD Primary Cardiologist: [place] Electrophysiologist: Lovena Le Dry Weight: 112 lb  Bi-V Pacing 100%      In the past month, have you:  1. Gained more than 2 pounds in a day or more than 5 pounds in a week? no  2. Had changes in your medications (with verification of current medications)? no  3. Had more shortness of breath than is usual for you? no  4. Limited your activity because of shortness of breath? no  5. Not been able to sleep because of shortness of breath? no  6. Had increased swelling in your feet or ankles? no  7. Had symptoms of dehydration (dizziness, dry mouth, increased thirst, decreased urine output) no  8. Had changes in sodium restriction? no  9. Been compliant with medication? Yes   ICM trend: 3 month view for 04/09/2016   ICM trend: 1 year view for 04/09/2016   Follow-up plan: ICM clinic phone appointment on 5/1//2017.  Thoracic impedance below reference line from 02/01/2016 to 04/09/2016 suggesting fluid accumulation.  Patient reporting weak, leg swelling, SOB with activties.  Patient does not review food labels for salt content and stated she does not eat foods that are high sodium.  Explained the normal foods can be high in sodium.  She stated she has not found an easy way to calculate salt intake.  Advised check food label and keep total sodium for meal at <650 mg and < 2000 mg daily and fluid intake to 64 oz daily. She has contacted Dr Percival Spanish and Dr Laqueta Due for fluid symptoms.  Dr Laqueta Due ordered Lasix 20 mg daily which she reported it was not helpful for symptoms and then increased to 40 mg daily.  She has an appointment with Dr Percival Spanish on 04/30/2016.  Advised she can call for fluid symptoms when needed.       Copy of note sent to patient's primary care physician, primary cardiologist, and device following  physician.  Rosalene Billings, RN, CCM 04/09/2016 12:18 PM

## 2016-04-11 ENCOUNTER — Ambulatory Visit
Admission: RE | Admit: 2016-04-11 | Discharge: 2016-04-11 | Disposition: A | Discharge status: Home / Self Care | Payer: Medicare Other | Source: Ambulatory Visit | Attending: Specialist | Admitting: Specialist

## 2016-04-11 DIAGNOSIS — M545 Low back pain: Principal | ICD-10-CM

## 2016-04-11 DIAGNOSIS — G8929 Other chronic pain: Secondary | ICD-10-CM

## 2016-04-11 MED ORDER — DIAZEPAM 5 MG PO TABS
5.0000 mg | ORAL_TABLET | Freq: Once | ORAL | Status: AC
  Administered 2016-04-11: 5 mg via ORAL

## 2016-04-11 MED ORDER — IOPAMIDOL (ISOVUE-M 200) INJECTION 41%
15.0000 mL | Freq: Once | INTRAMUSCULAR | Status: AC
  Administered 2016-04-11: 15 mL via INTRATHECAL

## 2016-04-11 MED ORDER — HYDROCODONE-ACETAMINOPHEN 5-325 MG PO TABS
1.0000 | ORAL_TABLET | Freq: Once | ORAL | Status: AC
  Administered 2016-04-11: 1 via ORAL

## 2016-04-11 NOTE — Discharge Instructions (Signed)
Myelogram Discharge Instructions  1. Go home and rest quietly for the next 24 hours.  It is important to lie flat for the next 24 hours.  Get up only to go to the restroom.  You may lie in the bed or on a couch on your back, your stomach, your left side or your right side.  You may have one pillow under your head.  You may have pillows between your knees while you are on your side or under your knees while you are on your back.  2. DO NOT drive today.  Recline the seat as far back as it will go, while still wearing your seat belt, on the way home.  3. You may get up to go to the bathroom as needed.  You may sit up for 10 minutes to eat.  You may resume your normal diet and medications unless otherwise indicated.  Drink lots of extra fluids today and tomorrow.  4. The incidence of headache, nausea, or vomiting is about 5% (one in 20 patients).  If you develop a headache, lie flat and drink plenty of fluids until the headache goes away.  Caffeinated beverages may be helpful.  If you develop severe nausea and vomiting or a headache that does not go away with flat bed rest, call [phone].  5. You may resume normal activities after your 24 hours of bed rest is over; however, do not exert yourself strongly or do any heavy lifting tomorrow. If when you get up you have a headache when standing, go back to bed and force fluids for another 24 hours.  6. Call your physician for a follow-up appointment.  The results of your myelogram will be sent directly to your physician by the following day.  7. If you have any questions or if complications develop after you arrive home, please call [phone].  Discharge instructions have been explained to the patient.  The patient, or the person responsible for the patient, fully understands these instructions.       May resume Prozac on April 12, 2016, after 11:00 am.

## 2016-04-16 ENCOUNTER — Telehealth: Payer: Self-pay | Admitting: Cardiology

## 2016-04-16 NOTE — Telephone Encounter (Signed)
04/16/2016 Received faxed records for upcoming appointment on 04/30/2016 with Dr. Percival Spanish.  Records given to [facility]. cbr

## 2016-04-24 ENCOUNTER — Telehealth: Payer: Self-pay | Admitting: Cardiology

## 2016-04-24 NOTE — Telephone Encounter (Signed)
Note not needed 

## 2016-04-25 ENCOUNTER — Telehealth: Payer: Self-pay

## 2016-04-25 NOTE — Telephone Encounter (Signed)
Received call from patient.  She inquired about types of low sodium foods to eat.  Reviewed some of the foods she has at home and explained how to read a food label for sodium amounts per serving and adding up the amounts for total at the end of the day.  Explained should be limited to '2000mg'$  daily.   She is having back surgery and has office appointment with Dr Percival Spanish on 04/30/2016 for surgery clearance.  Next transmission 04/29/2016.

## 2016-04-26 ENCOUNTER — Ambulatory Visit: Payer: Medicare Other | Admitting: Physician Assistant

## 2016-04-26 ENCOUNTER — Ambulatory Visit: Payer: Medicare Other | Admitting: Cardiology

## 2016-04-29 ENCOUNTER — Ambulatory Visit (INDEPENDENT_AMBULATORY_CARE_PROVIDER_SITE_OTHER): Payer: Medicare Other

## 2016-04-29 DIAGNOSIS — Z95810 Presence of automatic (implantable) cardiac defibrillator: Secondary | ICD-10-CM

## 2016-04-29 DIAGNOSIS — I5022 Chronic systolic (congestive) heart failure: Secondary | ICD-10-CM

## 2016-04-29 NOTE — Progress Notes (Signed)
HPI The patient presents for followup of aortic stenosis and reduced EF status post AVR.  She is now status post Bentall with bioprosthetic valve. 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   Since I last saw her she was having some shortness of breath.   Optivol demonstrated decreased impedance suggestive of increased volume and she was started on Lasix 20 mg daily. At that time she was having some indiscretion with salt use eating gravy biscuits.  She has sonce stopped this.  Her weight is down a few pounds and she thinks that she is breathing better.  She has had no acute cardiovascular complaints. She's not having any of the problems with her heart rate dropping with activity. She's continued to be limited by back pain.  She's had no chest pressure, neck or arm discomfort. She's had no new shortness of breath, PND or orthopnea. She's had no edema.  Allergies  Allergen Reactions  . Oxycodone Nausea Only    Current Outpatient Prescriptions  Medication Sig Dispense Refill  . albuterol (PROVENTIL HFA;VENTOLIN HFA) 108 (90 BASE) MCG/ACT inhaler Inhale 1-2 puffs into the lungs every 6 (six) hours as needed for wheezing or shortness of breath.    [name] albuterol (PROVENTIL) (2.5 MG/3ML) 0.083% nebulizer solution Take 2.5 mg by nebulization 2 (two) times daily as needed for wheezing.    . AMBULATORY NON FORMULARY MEDICATION Topical nasal rinse/ Tobram Budesonide Use nasal rinse daily as needed for dryness    . amoxicillin-clavulanate (AUGMENTIN) 875-125 MG tablet Take 1 tablet by mouth daily.    [name] aspirin EC 325 MG EC tablet Take 1 tablet (325 mg total) by mouth daily. 30 tablet 0  . B Complex Vitamins (VITAMIN B COMPLEX PO) Take 1 tablet by mouth daily. ON HOLD UNTIL 02/20/15    . Calcium Carbonate-Vitamin D (CALCIUM-D PO) Take 1 tablet by mouth daily.     . carvedilol (COREG) 12.5 MG tablet Take 1 tablet (12.5 mg total) by mouth 2 (two) times daily with a meal. 60 tablet 6  .  cyanocobalamin (,VITAMIN B-12,) 1000 MCG/ML injection Inject 1,000 mcg into the muscle once a week. ON HOLD UNTIL 02/20/15    . diazepam (VALIUM) 10 MG tablet Take 10 mg by mouth 3 (three) times daily as needed for anxiety.    [name] FLUoxetine (PROZAC) 20 MG capsule Take 20 mg by mouth daily.    . fluticasone (FLONASE) 50 MCG/ACT nasal spray Place 1 spray into the nose 2 (two) times daily.     . furosemide (LASIX) 20 MG tablet Take 1 tablet (20 mg total) by mouth daily. (Patient taking differently: Take 20 mg by mouth daily. Patient taking 2 tablets daily.) 15 tablet 0  . Halobetasol-Ammonium Lactate 0.05 & 12 % (CREAM) KIT Apply 1 application topically daily as needed (dryness).    [name] HYDROcodone-acetaminophen (NORCO/VICODIN) 5-325 MG per tablet Take 1 tablet by mouth every 6 (six) hours as needed for moderate pain or severe pain. 40 tablet 0  . loratadine (CLARITIN) 10 MG tablet Take 10 mg by mouth daily.    [name] losartan (COZAAR) 25 MG tablet TAKE 1 TABLET BY MOUTH DAILY. 30 tablet 11  . meloxicam (MOBIC) 15 MG tablet Take 15 mg by mouth daily as needed for pain.    . Menthol, Topical Analgesic, (ICY HOT EX) Apply 1 patch topically daily.    . montelukast (SINGULAIR) 10 MG tablet Take 10 mg by mouth at bedtime.    . Multiple  Vitamin (MULTIVITAMIN) capsule Take 1 capsule by mouth daily.    . nicotine (NICODERM CQ - DOSED IN MG/24 HOURS) 21 mg/24hr patch Place 1 patch (21 mg total) onto the skin daily. 28 patch 0  . NON FORMULARY Buminate ophth sol into the eyes two times a day    . Olopatadine HCl (PATANASE) 0.6 % SOLN Place 2 drops into both nostrils 2 (two) times daily.     [name] omeprazole (PRILOSEC) 20 MG capsule Take 40 mg by mouth daily.    . Probiotic Product (PROBIOTIC DAILY PO) Take 1 capsule by mouth daily. Ultimate flora    . pseudoephedrine-guaifenesin (MUCINEX D) 60-600 MG per tablet Take 1 tablet by mouth daily as needed for congestion.     . ranitidine (ZANTAC) 300 MG capsule Take 300 mg by  mouth every evening.    . Saline (SIMPLY SALINE) 0.9 % AERS Place 2 sprays into both nostrils 2 (two) times daily.     No current facility-administered medications for this visit.    Past Medical History  Diagnosis Date  . 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 Osteopenia   . HSV-1 (herpes simplex virus 1) infection   . HSV-2 (herpes simplex virus 2) infection   . Bicuspid aortic valve     CONGENITAL  . Aortic aneurysm ([place])   . Adrenal tumor   . Degenerative disc disease     CERVICAL AND LUMBAR (DR. Lorin Mercy)  . Smoker   . Anxiety   . Chronic sinusitis   . Chronic depression   . Osteoarthritis   . Pernicious anemia   . Insomnia   . Asthma   . Chronic fatigue   . Vitamin B 12 deficiency   . Plantar fasciitis   . Hammer toe of right foot   . Hepatomegaly   . PONV (postoperative nausea and vomiting)   . Shortness of breath dyspnea   . COPD (chronic obstructive pulmonary disease) ([place])   . GERD (gastroesophageal reflux disease)   . Carpal tunnel syndrome, bilateral     Past Surgical History  Procedure Laterality Date  . Excision of vulvar cis    . Sinus procedure  2009    x 6  . Excision of basal cell ca      SKIN   . Cataract extraction Bilateral     X2  . Carpal tunnel release Bilateral 2008  . Bunionectomy with hammertoe reconstruction Right   . Colonoscopy    . Upper gastrointestinal endoscopy    . Tee without cardioversion N/A 10/06/2014    Procedure: TRANSESOPHAGEAL ECHOCARDIOGRAM (TEE);  Surgeon: Sueanne Margarita, MD;  Location: [facility] ENDOSCOPY;  Service: Cardiovascular;  Laterality: N/A;  . Left and right heart catheterization with coronary angiogram N/A 09/23/2014    Procedure: LEFT AND RIGHT HEART CATHETERIZATION WITH CORONARY ANGIOGRAM;  Surgeon: Minus Breeding, MD;  Location: [facility] CATH LAB;  Service: Cardiovascular;  Laterality: N/A;  . Aortic valve replacement N/A 01/12/2015    Procedure: AORTIC VALVE REPLACEMENT (AVR);  Surgeon: Gaye Pollack, MD;   Location: [place];  Service: Open Heart Surgery;  Laterality: N/A;  . Ascending aortic root replacement N/A 01/12/2015    Procedure: ASCENDING AORTIC ROOT REPLACEMENT;  Surgeon: Gaye Pollack, MD;  Location: [place];  Service: Open Heart Surgery;  Laterality: N/A;  . Tee without cardioversion N/A 01/12/2015    Procedure: TRANSESOPHAGEAL ECHOCARDIOGRAM (TEE);  Surgeon: Gaye Pollack, MD;  Location: [place];  Service: Open Heart Surgery;  Laterality: N/A;  . Bi-ventricular pacemaker insertion N/A 01/18/2015    Procedure: BI-VENTRICULAR PACEMAKER INSERTION (CRT-P);  Surgeon: Evans Lance, MD;  Location: [facility] CATH LAB;  Service: Cardiovascular;  Laterality: N/A;    ROS:  As stated in the HPI and negative for all other systems.  PHYSICAL EXAM There were no vitals taken for this visit. GENERAL:  Frail appearing and looking older than her stated age. NECK:  No jugular venous distention, waveform within normal limits, carotid upstroke brisk and symmetric, no bruits, no thyromegaly LUNGS:  Clear to auscultation bilaterally BACK:  No CVA tenderness CHEST:  Well healed sternotomy scar.   HEART:  PMI not displaced or sustained,S1 and S2 within normal limits, no S3, no S4, no clicks, no rubs, 2/6 apical systolic murmur also at the right upper sternal border, early peaking, no diastolic murmurs ABD:  Flat, positive bowel sounds normal in frequency in pitch, no bruits, no rebound, no guarding, no midline pulsatile mass, no hepatomegaly, no splenomegaly EXT:  Mildly diminished bilateral dorsalis pedis and posterior tibialis plus pulses throughout, no edema, no cyanosis no clubbing    ASSESSMENT AND PLAN  AVR:  The patient's status post valve replacement and Bentall.    She needs a follow up echo.  She seems to be euvolemic on the current dose of Lasix.  I will check a BMET.    TOBACCO ABUSE:    We discussed a specific strategy for tobacco cessation.  (Greater than three minutes discussing tobacco  cessation.)  CARDIOMYOPATHY:   She seems to be euvolemic. No change in therapy is indicating.   I will check the echo as above.   ICD:  Bi V ICD-Medtronic-Optivol-Carelink.  She is up top date with follow up.

## 2016-04-29 NOTE — Progress Notes (Signed)
EPIC Encounter for ICM Monitoring  Patient Name: Carol Warren is a 70 y.o. female Date: 04/29/2016 Primary Care Physican: Ernestene Kiel, MD Primary Cardiologist: Hochrein Electrophysiologist: Lovena Le Dry Weight: unknown   Bi-V Pacing 100%      In the past month, have you:  1. Gained more than 2 pounds in a day or more than 5 pounds in a week? unknown  2. Had changes in your medications (with verification of current medications)? no  3. Had more shortness of breath than is usual for you? no  4. Limited your activity because of shortness of breath? no  5. Not been able to sleep because of shortness of breath? no  6. Had increased swelling in your feet or ankles? no  7. Had symptoms of dehydration (dizziness, dry mouth, increased thirst, decreased urine output) no  8. Had changes in sodium restriction? no  9. Been compliant with medication? Yes  ICM trend: 3 month view for 04/29/2016  ICM trend: 1 year view for 04/29/2016    Follow-up plan: ICM clinic phone appointment 05/30/2016.    FLUID LEVELS: Optivol thoracic impedance decreased 02/07/2016 to 04/19/2016 suggesting fluid accumulation and returned to reference line 04/20/2016 suggesting fluid levels have stablized.   SYMPTOMS: None. Encouraged to call for any fluid symptoms.  EDUCATION:   Discussed at length on 04/25/2016 regarding food sodium amounts and limit to < 2000 mg.   She stated she drinks a lot of fluid symptoms and advised to limit to 64 oz.  No changes today.    Advised will send to Dr. Percival Spanish to review if needed for office visit on 04/30/2016.   Rosalene Billings, RN, CCM 04/29/2016 10:25 AM

## 2016-04-30 ENCOUNTER — Telehealth: Payer: Self-pay | Admitting: Cardiology

## 2016-04-30 ENCOUNTER — Encounter: Payer: Self-pay | Admitting: Cardiology

## 2016-04-30 ENCOUNTER — Ambulatory Visit (INDEPENDENT_AMBULATORY_CARE_PROVIDER_SITE_OTHER): Payer: Medicare Other | Admitting: Cardiology

## 2016-04-30 VITALS — BP 138/86 | HR 82 | Ht 61.0 in | Wt 114.8 lb

## 2016-04-30 DIAGNOSIS — Z952 Presence of prosthetic heart valve: Secondary | ICD-10-CM

## 2016-04-30 DIAGNOSIS — Z954 Presence of other heart-valve replacement: Secondary | ICD-10-CM | POA: Diagnosis not present

## 2016-04-30 LAB — BASIC METABOLIC PANEL
BUN: 14 mg/dL (ref 7–25)
CALCIUM: 9.2 mg/dL (ref 8.6–10.4)
CO2: 26 mmol/L (ref 20–31)
Chloride: 99 mmol/L (ref 98–110)
Creat: 0.96 mg/dL (ref 0.50–0.99)
GLUCOSE: 74 mg/dL (ref 65–99)
Potassium: 4.2 mmol/L (ref 3.5–5.3)
Sodium: 136 mmol/L (ref 135–146)

## 2016-04-30 NOTE — Telephone Encounter (Signed)
Call for setting up echo. Will send msg to scheduling.

## 2016-04-30 NOTE — Patient Instructions (Signed)
Medication Instructions:  Continue current medication therapy  Labwork: BMP Today  Testing/Procedures: Your physician has requested that you have an echocardiogram. Echocardiography is a painless test that uses sound waves to create images of your heart. It provides your doctor with information about the size and shape of your heart and how well your heart's chambers and valves are working. This procedure takes approximately one hour. There are no restrictions for this procedure.  Follow-Up: 6 Months  Any Other Special Instructions Will Be Listed Below (If Applicable).   If you need a refill on your cardiac medications before your next appointment, please call your pharmacy.

## 2016-04-30 NOTE — Telephone Encounter (Signed)
New message   Pt wants rn to call her back she states she missed a call from office

## 2016-05-06 ENCOUNTER — Ambulatory Visit (INDEPENDENT_AMBULATORY_CARE_PROVIDER_SITE_OTHER): Payer: Medicare Other | Admitting: *Deleted

## 2016-05-06 DIAGNOSIS — I428 Other cardiomyopathies: Secondary | ICD-10-CM

## 2016-05-06 DIAGNOSIS — I429 Cardiomyopathy, unspecified: Secondary | ICD-10-CM | POA: Diagnosis not present

## 2016-05-07 NOTE — Progress Notes (Signed)
Remote ICD transmission.   

## 2016-05-14 ENCOUNTER — Ambulatory Visit (HOSPITAL_COMMUNITY): Payer: Medicare Other | Attending: Cardiology

## 2016-05-14 ENCOUNTER — Other Ambulatory Visit: Payer: Self-pay

## 2016-05-14 DIAGNOSIS — I340 Nonrheumatic mitral (valve) insufficiency: Secondary | ICD-10-CM | POA: Diagnosis not present

## 2016-05-14 DIAGNOSIS — Z952 Presence of prosthetic heart valve: Secondary | ICD-10-CM

## 2016-05-14 DIAGNOSIS — I5189 Other ill-defined heart diseases: Secondary | ICD-10-CM | POA: Insufficient documentation

## 2016-05-14 DIAGNOSIS — Z953 Presence of xenogenic heart valve: Secondary | ICD-10-CM | POA: Insufficient documentation

## 2016-05-14 DIAGNOSIS — I071 Rheumatic tricuspid insufficiency: Secondary | ICD-10-CM | POA: Insufficient documentation

## 2016-05-14 DIAGNOSIS — Z954 Presence of other heart-valve replacement: Secondary | ICD-10-CM

## 2016-05-14 DIAGNOSIS — I351 Nonrheumatic aortic (valve) insufficiency: Secondary | ICD-10-CM | POA: Insufficient documentation

## 2016-05-14 DIAGNOSIS — Z720 Tobacco use: Secondary | ICD-10-CM | POA: Insufficient documentation

## 2016-05-14 DIAGNOSIS — I359 Nonrheumatic aortic valve disorder, unspecified: Secondary | ICD-10-CM | POA: Diagnosis present

## 2016-05-28 ENCOUNTER — Other Ambulatory Visit: Payer: Self-pay | Admitting: *Deleted

## 2016-05-28 MED ORDER — CARVEDILOL 12.5 MG PO TABS: 12.5000 mg | ORAL_TABLET | Freq: Two times a day (BID) | ORAL | Status: DC

## 2016-05-30 ENCOUNTER — Ambulatory Visit (INDEPENDENT_AMBULATORY_CARE_PROVIDER_SITE_OTHER): Payer: Medicare Other

## 2016-05-30 DIAGNOSIS — I5022 Chronic systolic (congestive) heart failure: Secondary | ICD-10-CM | POA: Diagnosis not present

## 2016-05-30 DIAGNOSIS — Z95810 Presence of automatic (implantable) cardiac defibrillator: Secondary | ICD-10-CM | POA: Diagnosis not present

## 2016-05-31 NOTE — Progress Notes (Signed)
EPIC Encounter for ICM Monitoring  Patient Name: Carol Warren is a 70 y.o. female Date: 05/31/2016 Primary Care Physican: Ernestene Kiel, MD Primary Cardiologist: [place] Electrophysiologist: Lovena Le Dry Weight: 112 lbs   Bi-V Pacing 100%      In the past month, have you:  1. Gained more than 2 pounds in a day or more than 5 pounds in a week? no  2. Had changes in your medications (with verification of current medications)? no  3. Had more shortness of breath than is usual for you? no  4. Limited your activity because of shortness of breath? no  5. Not been able to sleep because of shortness of breath? no  6. Had increased swelling in your feet, ankles, legs or stomach area? no  7. Had symptoms of dehydration (dizziness, dry mouth, increased thirst, decreased urine output) no  8. Had changes in sodium restriction? no  9. Been compliant with medication? Yes  ICM trend: 3 month view for 05/30/2016   ICM trend: 1 year view for 05/30/2016   Follow-up plan: ICM clinic phone appointment 07/04/2016.    FLUID LEVELS:  Optivol thoracic impedance trending close to baseline.    SYMPTOMS: None.  Denied any symptoms such as weight gain of 3 pounds overnight or 5 pounds within a week, SOB and/or lower extremity swelling. Encouraged to call for any fluid symptoms.   EDUCATION: Limit sodium intake to < 2000 mg and fluid intake to 64 oz daily.     RECOMMENDATIONS: No changes today.        Rosalene Billings, RN, CCM 05/31/2016 8:52 AM

## 2016-06-02 ENCOUNTER — Telehealth: Payer: Self-pay | Admitting: Student

## 2016-06-02 NOTE — Telephone Encounter (Signed)
Received a call from the patient that her weight has gone up over the past two weeks, but acutely has gone from 116 lbs yesterday to 120 lbs today.   Denies any worsening dyspnea or orthopnea. Notes increased lower extremity edema on the left side.  Reports being on a lot of allergy medications and increasing fluid intake over the past few days. Has also been consuming more processed foods.  Instructed her to increase her Lasix dosing from 40 mg daily to 40 mg BID for the next 3 days. Reviewed diet recommendations with the patient. Will call back if her weight remains elevated following the dose adjustment or if she develops acute symptoms.      Signed, Erma Heritage, PA-C 06/02/2016, 1:04 PM Pager: [phone]

## 2016-06-04 ENCOUNTER — Telehealth: Payer: Self-pay | Admitting: Cardiology

## 2016-06-04 DIAGNOSIS — Z79899 Other long term (current) drug therapy: Secondary | ICD-10-CM

## 2016-06-04 NOTE — Telephone Encounter (Signed)
Please ask her to get a BMET.

## 2016-06-04 NOTE — Telephone Encounter (Signed)
New message     Wt today is 114 lbs.  Pt has lost 4 lbs since Sunday.  She is doubling up her lasix.  Tomorrow am, she will take regular dosage.  Pt states she feels tired and drained. Is this normal?

## 2016-06-04 NOTE — Telephone Encounter (Signed)
Spoke with patient and her weight is down the 4 pounds she was  Denies swelling or change in breathing, no issues with that prior with weight gain She is currently taking Augmentin for a sinus infection and felt she was burning up all night  No monitor at home to check blood pressure  She did take her increased dose of Furosemide already today Advised patient to go back to her regular dose of Furosemide as originally recommended by Jeralyn Bennett PA If no better follow up with PCP

## 2016-06-05 LAB — BASIC METABOLIC PANEL
BUN: 15 mg/dL (ref 7–25)
CALCIUM: 8.7 mg/dL (ref 8.6–10.4)
CO2: 24 mmol/L (ref 20–31)
Chloride: 96 mmol/L — ABNORMAL LOW (ref 98–110)
Creat: 0.97 mg/dL (ref 0.50–0.99)
GLUCOSE: 133 mg/dL — AB (ref 65–99)
POTASSIUM: 4.8 mmol/L (ref 3.5–5.3)
SODIUM: 133 mmol/L — AB (ref 135–146)

## 2016-06-05 NOTE — Telephone Encounter (Signed)
Spoke with pt letting her know Dr Percival Spanish want her to have BMP done, pt says she will come in and get it done today  BMP was orderd

## 2016-06-06 ENCOUNTER — Encounter: Payer: Self-pay | Admitting: Cardiology

## 2016-06-07 IMAGING — CT CT L SPINE W/ CM
1 of 6 series · 5 of 14 positions shown, 7 images · non-contrast
Comparison: Lumbar myelogram 04/04/2014 and MRI of the lumbar spine
01/29/2014.

CLINICAL DATA: Chronic low back pain extending into the lower
extremities bilaterally, left greater than right. Lower extremity
pain has progressed over the last 2 years.
TECHNIQUE: Contiguous axial images were obtained through the Lumbar spine after
the intrathecal infusion of infusion. Coronal and sagittal
reconstructions were obtained of the axial image sets.

[Series 2: l spine soft · axial · 0.23mm/px · z∈[-297,-153]mm · 5 of 72 slices shown, 7 images]
[im 12/72  soft-tissue]
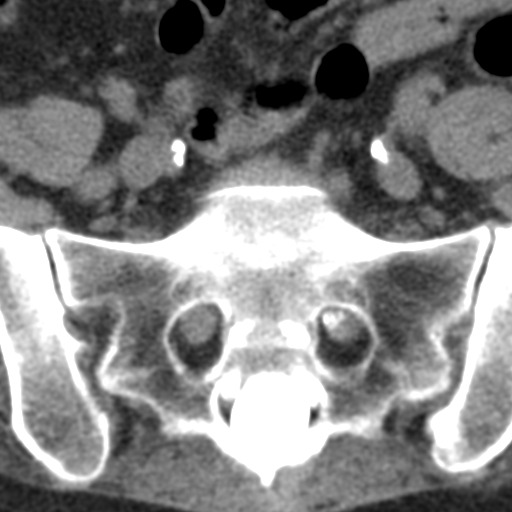
[im 12/72  bone]
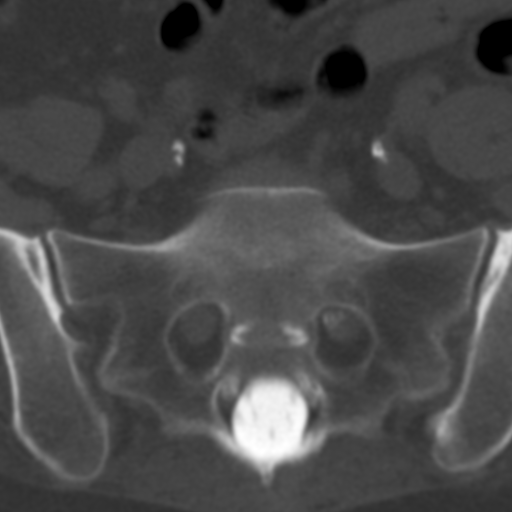
[im 24/72  bone]
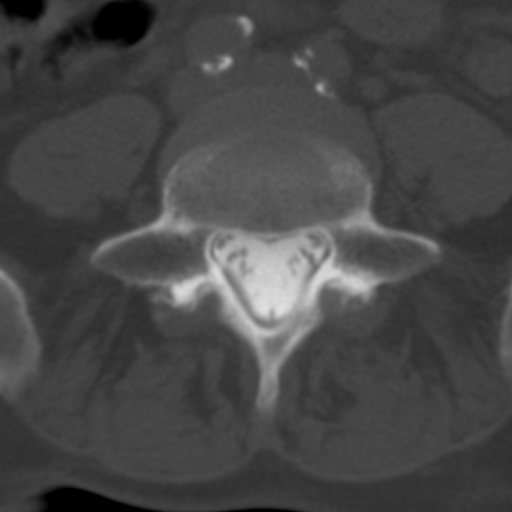
[im 36/72  bone]
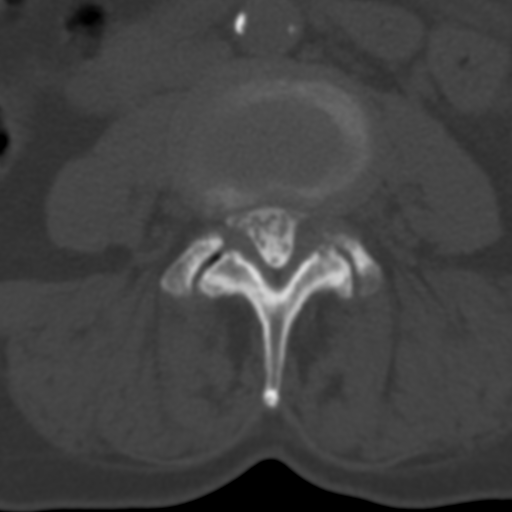
[im 48/72  bone]
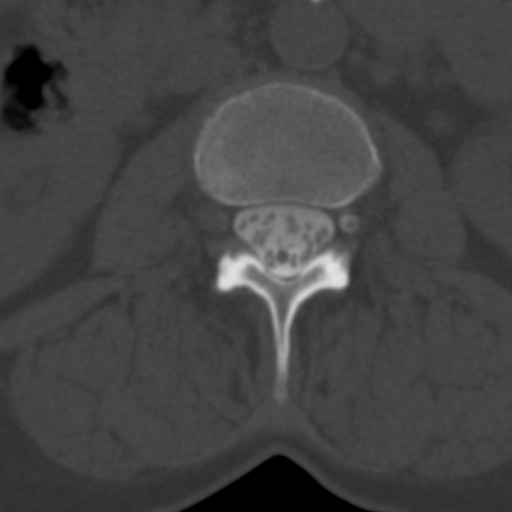
[im 60/72  soft-tissue]
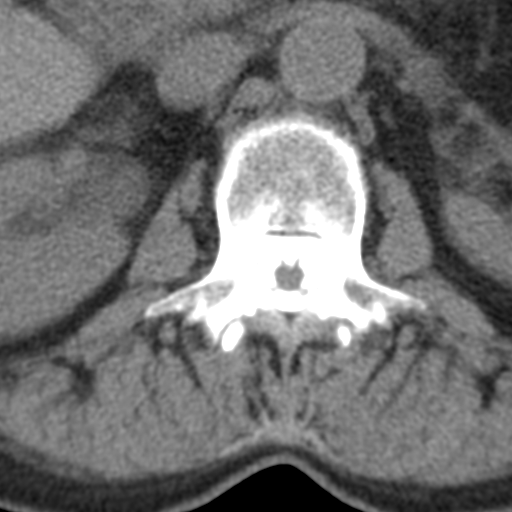
[im 60/72  bone]
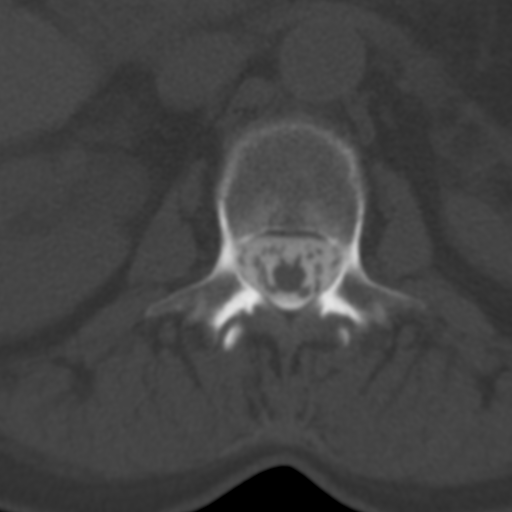

[5 of 14 positions shown; findings below may reference images not displayed]

EXAM:
LUMBAR MYELOGRAM

FLUOROSCOPY TIME:  150.35 uGy*m2

PROCEDURE:
After thorough discussion of risks and benefits of the procedure
including bleeding, infection, injury to nerves, blood vessels,
adjacent structures as well as headache and CSF leak, written and
oral informed consent was obtained. Consent was obtained by Dr.
Hadja Boots. Time out form was completed.

Patient was positioned prone on the fluoroscopy table. Local
anesthesia was provided with 1% lidocaine without epinephrine after
prepped and draped in the usual sterile fashion. Puncture was
performed at L2-3 using a 3 1/2 inch 22-gauge spinal needle via
right paramedian approach. Using a single pass through the dura, the
needle was placed within the thecal sac, with return of clear CSF.
15 mL of Isovue-M 200 was injected into the thecal sac, with normal
opacification of the nerve roots and cauda equina consistent with
free flow within the subarachnoid space.

I personally performed the lumbar puncture and administered the
intrathecal contrast. I also personally supervised acquisition of
the myelogram images.
FINDINGS: LUMBAR MYELOGRAM FINDINGS:

Slight anterolisthesis at L3-4 and L4-5 is stable. There is
progressive left subarticular narrowing at L3-4 and L4-5

Lumbar nerve roots fill normally throughout the lumbar spine.

There is no significant change in alignment with standing.
Anterolisthesis at L4-5 is slightly worse with flexion and reduced
in extension. Atherosclerotic changes are present at the aorta.

CT LUMBAR MYELOGRAM FINDINGS:

The lumbar spine is imaged from the midbody of T12 through S3-4.
Slight anterolisthesis at L3-4 and L4-5 has progressed since the
prior exam. Vertebral body heights are within normal limits.

Limited imaging the abdomen demonstrates atherosclerotic
calcifications of the abdominal aorta and branch vessels. No
significant adenopathy is present. Limited imaging of the abdomen is
otherwise unremarkable.

T12-L1: A rightward disc protrusion is again noted. There is no
significant stenosis or change.

L1-2:  Negative.

L2-3: Mild facet hypertrophy is present bilaterally. There is no
significant stenosis.

L3-4: A broad-based disc protrusion is asymmetric to the left. There
is progressive moderate facet hypertrophy. This results in slight
progression of mild subarticular narrowing bilaterally, left greater
than right. There is slight progression of foraminal narrowing as
well, left greater than right.

L4-5: A broad-based disc protrusion is present. Moderate facet
hypertrophy has progressed. This results in progressive mild
subarticular narrowing bilaterally, left greater than right. Mild to
moderate left and mild right foraminal narrowing demonstrate some
progression.

L5-S1: A shallow central disc protrusion is present. Mild facet
hypertrophy is noted bilaterally. There is no significant stenosis
or change.
IMPRESSION: LUMBAR MYELOGRAM IMPRESSION:

1. Slight progression of left subarticular narrowing at L3-4 and
L4-5.
2. Dynamic anterolisthesis at L4-5.

CT LUMBAR MYELOGRAM IMPRESSION:

1. Slight progression of mild subarticular and foraminal narrowing
bilaterally at L3-4, worse on the left.
2. Progressive mild subarticular narrowing at L4-5 is also worse on
the left.
3. Mild to moderate left and mild right foraminal narrowing at L4-5
with some progression.
4. Stable facet hypertrophy at L2-3 and L5-S1 without significant
stenosis.

## 2016-06-11 LAB — CUP PACEART REMOTE DEVICE CHECK
Brady Statistic AP VS Percent: 0.01 %
Brady Statistic AS VP Percent: 99.64 %
Brady Statistic AS VS Percent: 0.02 %
Brady Statistic RA Percent Paced: 0.34 %
Date Time Interrogation Session: 20170508062604
HIGH POWER IMPEDANCE MEASURED VALUE: 60 Ohm
Implantable Lead Implant Date: 20160120
Implantable Lead Location: 753858
Implantable Lead Location: 753859
Implantable Lead Location: 753860
Implantable Lead Model: 5076
Lead Channel Impedance Value: 399 Ohm
Lead Channel Impedance Value: 418 Ohm
Lead Channel Impedance Value: 513 Ohm
Lead Channel Impedance Value: 646 Ohm
Lead Channel Impedance Value: 760 Ohm
Lead Channel Impedance Value: 779 Ohm
Lead Channel Impedance Value: 836 Ohm
Lead Channel Pacing Threshold Amplitude: 0.5 V
Lead Channel Pacing Threshold Amplitude: 1.125 V
Lead Channel Pacing Threshold Pulse Width: 0.4 ms
Lead Channel Pacing Threshold Pulse Width: 0.4 ms
Lead Channel Pacing Threshold Pulse Width: 0.4 ms
Lead Channel Sensing Intrinsic Amplitude: 0.75 mV
Lead Channel Sensing Intrinsic Amplitude: 27 mV
Lead Channel Setting Pacing Amplitude: 1.5 V
Lead Channel Setting Pacing Amplitude: 2 V
Lead Channel Setting Pacing Pulse Width: 0.4 ms
Lead Channel Setting Sensing Sensitivity: 0.45 mV
MDC IDC LEAD IMPLANT DT: 20160120
MDC IDC LEAD IMPLANT DT: 20160120
MDC IDC LEAD MODEL: 4598
MDC IDC LEAD MODEL: 6935
MDC IDC MSMT BATTERY REMAINING LONGEVITY: 62 mo
MDC IDC MSMT BATTERY VOLTAGE: 2.98 V
MDC IDC MSMT LEADCHNL LV IMPEDANCE VALUE: 361 Ohm
MDC IDC MSMT LEADCHNL LV IMPEDANCE VALUE: 399 Ohm
MDC IDC MSMT LEADCHNL LV IMPEDANCE VALUE: 475 Ohm
MDC IDC MSMT LEADCHNL LV IMPEDANCE VALUE: 513 Ohm
MDC IDC MSMT LEADCHNL LV IMPEDANCE VALUE: 608 Ohm
MDC IDC MSMT LEADCHNL RA SENSING INTR AMPL: 0.75 mV
MDC IDC MSMT LEADCHNL RV IMPEDANCE VALUE: 589 Ohm
MDC IDC MSMT LEADCHNL RV PACING THRESHOLD AMPLITUDE: 0.625 V
MDC IDC MSMT LEADCHNL RV SENSING INTR AMPL: 27 mV
MDC IDC SET LEADCHNL LV PACING AMPLITUDE: 2.75 V
MDC IDC SET LEADCHNL RV PACING PULSEWIDTH: 0.4 ms
MDC IDC STAT BRADY AP VP PERCENT: 0.33 %
MDC IDC STAT BRADY RV PERCENT PACED: 99.96 %

## 2016-06-12 ENCOUNTER — Encounter: Payer: Self-pay | Admitting: Cardiology

## 2016-06-26 ENCOUNTER — Encounter: Payer: Self-pay | Admitting: Cardiology

## 2016-07-01 ENCOUNTER — Other Ambulatory Visit: Payer: Self-pay

## 2016-07-01 MED ORDER — CARVEDILOL 12.5 MG PO TABS: 12.5000 mg | ORAL_TABLET | Freq: Two times a day (BID) | ORAL | Status: DC

## 2016-07-04 ENCOUNTER — Ambulatory Visit (INDEPENDENT_AMBULATORY_CARE_PROVIDER_SITE_OTHER): Payer: Medicare Other

## 2016-07-04 DIAGNOSIS — I5022 Chronic systolic (congestive) heart failure: Secondary | ICD-10-CM | POA: Diagnosis not present

## 2016-07-04 DIAGNOSIS — Z95810 Presence of automatic (implantable) cardiac defibrillator: Secondary | ICD-10-CM | POA: Diagnosis not present

## 2016-07-04 NOTE — Progress Notes (Signed)
EPIC Encounter for ICM Monitoring  Patient Name: Carol Warren is a 70 y.o. female Date: 07/04/2016 Primary Care Physican: Ernestene Kiel, MD Primary Cardiologist: [place] Electrophysiologist: Lovena Le Dry Weight: 114 lb  Bi-V Pacing:  100%       Heart Failure questions reviewed, pt asymptomatic  Thoracic impedence stable Low sodium diet education provided  Recommendations: None  ICM trend: 07/04/2016            Follow-up plan: ICM clinic phone appointment on 08/07/2016.  Copy of ICM check sent to device physician.   Rosalene Billings, RN 07/04/2016 2:38 PM

## 2016-07-18 ENCOUNTER — Telehealth: Payer: Self-pay | Admitting: *Deleted

## 2016-07-18 NOTE — Telephone Encounter (Signed)
Requesting surgical clearance:   1. Type of surgery: Lumbar Decompression/Fusion L3-4,L4-5  2. Surgeon: Basil Dess  3. Surgical date: Pending  4. Medications that need to be help: Aspirin  Is patient cleared for Surgery?

## 2016-07-19 NOTE — Telephone Encounter (Signed)
She has no contraindication to surgery.  She can hold her ASA as necessary for the operation.

## 2016-07-22 NOTE — Telephone Encounter (Signed)
Clearance faxed to [employer] via [employer] and via [employer]

## 2016-07-23 ENCOUNTER — Telehealth: Payer: Self-pay | Admitting: Cardiology

## 2016-07-23 NOTE — Telephone Encounter (Signed)
refaxed documentation to Dr. Otho Ket office after confirming fax number.  Patient aware and voiced thanks.

## 2016-07-23 NOTE — Telephone Encounter (Signed)
New Message  Pt calling for update on clearance. Pt stated refer MD hasn't received anything. Please Call Back. wy

## 2016-08-07 ENCOUNTER — Ambulatory Visit (INDEPENDENT_AMBULATORY_CARE_PROVIDER_SITE_OTHER): Payer: Medicare Other | Admitting: *Deleted

## 2016-08-07 DIAGNOSIS — Z95810 Presence of automatic (implantable) cardiac defibrillator: Secondary | ICD-10-CM

## 2016-08-07 DIAGNOSIS — I5022 Chronic systolic (congestive) heart failure: Secondary | ICD-10-CM

## 2016-08-07 DIAGNOSIS — I428 Other cardiomyopathies: Secondary | ICD-10-CM

## 2016-08-07 NOTE — Progress Notes (Signed)
Remote ICD transmission.   

## 2016-08-08 ENCOUNTER — Telehealth: Payer: Self-pay

## 2016-08-08 ENCOUNTER — Encounter: Payer: Self-pay | Admitting: Cardiology

## 2016-08-08 NOTE — Progress Notes (Signed)
EPIC Encounter for ICM Monitoring  Patient Name: Carol Warren is a 71 y.o. female Date: 08/08/2016 Primary Care Physican: Ernestene Kiel, MD Primary Cardiologist: [place] Electrophysiologist: Lovena Le Dry Weight: unknown Bi-V Pacing:  99.9%       Attempted patient call and unable to reach.  Transmission reviewed.   Thoracic impedance normal.   ICM trend: 08/07/2016     Follow-up plan: ICM clinic phone appointment on 09/11/2016.  Copy of ICM check sent to device physician.   Rosalene Billings, RN 08/08/2016 9:25 AM

## 2016-08-08 NOTE — Telephone Encounter (Signed)
Remote ICM transmission received.  Attempted patient call and left detailed message and requested call back.

## 2016-08-12 LAB — CUP PACEART REMOTE DEVICE CHECK
Battery Remaining Longevity: 55 mo
Battery Voltage: 2.97 V
Brady Statistic AP VS Percent: 0.01 %
Brady Statistic AS VP Percent: 99.49 %
Brady Statistic AS VS Percent: 0.03 %
Date Time Interrogation Session: 20170809041703
HIGH POWER IMPEDANCE MEASURED VALUE: 60 Ohm
Implantable Lead Implant Date: 20160120
Implantable Lead Location: 753858
Implantable Lead Location: 753859
Implantable Lead Location: 753860
Implantable Lead Model: 5076
Lead Channel Impedance Value: 342 Ohm
Lead Channel Impedance Value: 399 Ohm
Lead Channel Impedance Value: 475 Ohm
Lead Channel Impedance Value: 513 Ohm
Lead Channel Impedance Value: 589 Ohm
Lead Channel Impedance Value: 608 Ohm
Lead Channel Impedance Value: 760 Ohm
Lead Channel Impedance Value: 779 Ohm
Lead Channel Pacing Threshold Amplitude: 0.5 V
Lead Channel Pacing Threshold Amplitude: 0.625 V
Lead Channel Pacing Threshold Amplitude: 1.25 V
Lead Channel Pacing Threshold Pulse Width: 0.4 ms
Lead Channel Pacing Threshold Pulse Width: 0.4 ms
Lead Channel Sensing Intrinsic Amplitude: 1.25 mV
Lead Channel Sensing Intrinsic Amplitude: 1.25 mV
Lead Channel Setting Pacing Amplitude: 1.5 V
Lead Channel Setting Pacing Pulse Width: 0.4 ms
Lead Channel Setting Sensing Sensitivity: 0.45 mV
MDC IDC LEAD IMPLANT DT: 20160120
MDC IDC LEAD IMPLANT DT: 20160120
MDC IDC LEAD MODEL: 4598
MDC IDC LEAD MODEL: 6935
MDC IDC MSMT LEADCHNL LV IMPEDANCE VALUE: 361 Ohm
MDC IDC MSMT LEADCHNL LV IMPEDANCE VALUE: 475 Ohm
MDC IDC MSMT LEADCHNL LV IMPEDANCE VALUE: 608 Ohm
MDC IDC MSMT LEADCHNL LV IMPEDANCE VALUE: 760 Ohm
MDC IDC MSMT LEADCHNL RA IMPEDANCE VALUE: 418 Ohm
MDC IDC MSMT LEADCHNL RV PACING THRESHOLD PULSEWIDTH: 0.4 ms
MDC IDC MSMT LEADCHNL RV SENSING INTR AMPL: 27 mV
MDC IDC MSMT LEADCHNL RV SENSING INTR AMPL: 27 mV
MDC IDC SET LEADCHNL LV PACING AMPLITUDE: 2.75 V
MDC IDC SET LEADCHNL RV PACING AMPLITUDE: 2 V
MDC IDC SET LEADCHNL RV PACING PULSEWIDTH: 0.4 ms
MDC IDC STAT BRADY AP VP PERCENT: 0.47 %
MDC IDC STAT BRADY RA PERCENT PACED: 0.48 %
MDC IDC STAT BRADY RV PERCENT PACED: 99.94 %

## 2016-08-23 ENCOUNTER — Encounter (HOSPITAL_COMMUNITY): Payer: Self-pay

## 2016-08-23 ENCOUNTER — Encounter (HOSPITAL_COMMUNITY)
Admission: RE | Admit: 2016-08-23 | Discharge: 2016-08-23 | Disposition: A | Discharge status: Home / Self Care | Payer: Medicare Other | Source: Ambulatory Visit | Attending: Specialist | Admitting: Specialist

## 2016-08-23 ENCOUNTER — Encounter (HOSPITAL_COMMUNITY)
Admission: RE | Admit: 2016-08-23 | Discharge: 2016-08-23 | Disposition: A | Discharge status: Home / Self Care | Payer: Medicare Other | Source: Ambulatory Visit | Attending: Surgery | Admitting: Surgery

## 2016-08-23 DIAGNOSIS — J449 Chronic obstructive pulmonary disease, unspecified: Secondary | ICD-10-CM | POA: Insufficient documentation

## 2016-08-23 DIAGNOSIS — Z7982 Long term (current) use of aspirin: Secondary | ICD-10-CM | POA: Diagnosis not present

## 2016-08-23 DIAGNOSIS — Z0183 Encounter for blood typing: Secondary | ICD-10-CM | POA: Diagnosis not present

## 2016-08-23 DIAGNOSIS — Z79899 Other long term (current) drug therapy: Secondary | ICD-10-CM | POA: Insufficient documentation

## 2016-08-23 DIAGNOSIS — Z01812 Encounter for preprocedural laboratory examination: Secondary | ICD-10-CM | POA: Diagnosis not present

## 2016-08-23 DIAGNOSIS — F17200 Nicotine dependence, unspecified, uncomplicated: Secondary | ICD-10-CM | POA: Diagnosis not present

## 2016-08-23 DIAGNOSIS — Z8589 Personal history of malignant neoplasm of other organs and systems: Secondary | ICD-10-CM | POA: Diagnosis not present

## 2016-08-23 DIAGNOSIS — K219 Gastro-esophageal reflux disease without esophagitis: Secondary | ICD-10-CM | POA: Diagnosis not present

## 2016-08-23 DIAGNOSIS — Z01818 Encounter for other preprocedural examination: Secondary | ICD-10-CM | POA: Diagnosis not present

## 2016-08-23 DIAGNOSIS — Z95810 Presence of automatic (implantable) cardiac defibrillator: Secondary | ICD-10-CM | POA: Diagnosis not present

## 2016-08-23 DIAGNOSIS — M4806 Spinal stenosis, lumbar region: Secondary | ICD-10-CM | POA: Insufficient documentation

## 2016-08-23 HISTORY — DX: Spinal stenosis, lumbar region without neurogenic claudication: M48.061

## 2016-08-23 HISTORY — DX: Venous insufficiency (chronic) (peripheral): I87.2

## 2016-08-23 LAB — COMPREHENSIVE METABOLIC PANEL
ALBUMIN: 3.7 g/dL (ref 3.5–5.0)
ALK PHOS: 60 U/L (ref 38–126)
ALT: 23 U/L (ref 14–54)
ANION GAP: 8 (ref 5–15)
AST: 26 U/L (ref 15–41)
BILIRUBIN TOTAL: 0.4 mg/dL (ref 0.3–1.2)
BUN: 12 mg/dL (ref 6–20)
CALCIUM: 9.1 mg/dL (ref 8.9–10.3)
CO2: 26 mmol/L (ref 22–32)
Chloride: 101 mmol/L (ref 101–111)
Creatinine, Ser: 0.94 mg/dL (ref 0.44–1.00)
GLUCOSE: 96 mg/dL (ref 65–99)
POTASSIUM: 4.1 mmol/L (ref 3.5–5.1)
Sodium: 135 mmol/L (ref 135–145)
TOTAL PROTEIN: 7.1 g/dL (ref 6.5–8.1)

## 2016-08-23 LAB — URINALYSIS, ROUTINE W REFLEX MICROSCOPIC
Bilirubin Urine: NEGATIVE
GLUCOSE, UA: NEGATIVE mg/dL
Hgb urine dipstick: NEGATIVE
Ketones, ur: NEGATIVE mg/dL
LEUKOCYTES UA: NEGATIVE
NITRITE: NEGATIVE
PH: 6.5 (ref 5.0–8.0)
PROTEIN: NEGATIVE mg/dL
Specific Gravity, Urine: 1.008 (ref 1.005–1.030)

## 2016-08-23 LAB — CBC
HEMATOCRIT: 37.6 % (ref 36.0–46.0)
HEMOGLOBIN: 12.2 g/dL (ref 12.0–15.0)
MCH: 27.9 pg (ref 26.0–34.0)
MCHC: 32.4 g/dL (ref 30.0–36.0)
MCV: 86 fL (ref 78.0–100.0)
Platelets: 358 10*3/uL (ref 150–400)
RBC: 4.37 MIL/uL (ref 3.87–5.11)
RDW: 16.4 % — AB (ref 11.5–15.5)
WBC: 11.1 10*3/uL — ABNORMAL HIGH (ref 4.0–10.5)

## 2016-08-23 LAB — PROTIME-INR
INR: 0.96
PROTHROMBIN TIME: 12.8 s (ref 11.4–15.2)

## 2016-08-23 LAB — SURGICAL PCR SCREEN
MRSA, PCR: NEGATIVE
STAPHYLOCOCCUS AUREUS: NEGATIVE

## 2016-08-23 LAB — TYPE AND SCREEN
ABO/RH(D): A POS
Antibody Screen: NEGATIVE

## 2016-08-23 NOTE — Progress Notes (Signed)
Pt denies any new /acute cardiopulmonary issues. Pt is under the care of Dr. Percival Spanish, Cardiology. Pt stated that she had a stress test . 10 years ago. Pt denies having a chest x ray within the last 12 months. Pt denies having any labs drawn within the last 2 weeks.Spoke with Sherrie, Surgical Scheduler, to have MD clarify pre-op Aspirin instructions. According to [facility], pt can stop taking Aspirin now. Pt chart forwarded to anesthesia to review cardiac history and clearance note ( on chart). Spoke with Tomi Bamberger, [employer] Representative,  to make aware that procedure will likely interfere with device function. Device should be programmed tachy therapies disabled, asynchronous pacing during procedure and returned to normal programming after procedure. Tomi Bamberger made aware that pt is scheduled to arrive at [facility] at 5:30 A.M. on August 30, 2016 for a 7:30 A.M. procedure. Tomi Bamberger stated, " someone will be there."

## 2016-08-23 NOTE — Pre-Procedure Instructions (Signed)
Carol Warren  08/23/2016      [facility] - [place], [place] - [address] [place] [address] Phone: [phone] Fax: [phone]    Your procedure is scheduled on Friday, August 30, 2016  Report to [facility] Admitting at 5:30 A.M.  Call this number if you have problems the morning of surgery:  [phone]   Remember:  Do not eat food or drink liquids after midnight Thursday, August 29, 2016  Take these medicines the morning of surgery with A SIP OF WATER : Carvedilol ( Coreg),  Fluoxetine   ( Prozac), Loratadine ( Claritin), Omeprazole ( Prilosec), nasal sprays, Systane eye gel, Buminate eye drops,  if needed: Hydrocodone for pain, Diazepam ( Valium) for anxiety, Sinus Wash nasal spray, Albuterol Nebulizer treatment for wheezing, Albuterol inhaler for wheezing or shortness of breath ( bring inhaler in with you on day of surgery). Stop taking Aspirin, vitamins, fish oil, Pseudoephedrine-Guaifenesin ( Mucinex-D),  and herbal medications such as Probiotics. Do not take any NSAIDs ie: Ibuprofen, Advil, Naproxen, BC and Goody Powder or any medication containing Aspirin such as Meloxicam ( Mobic); stop now.  Do not wear jewelry, make-up or nail polish.  Do not wear lotions, powders, or perfumes, or deoderant.  Do not shave 48 hours prior to surgery.    Do not bring valuables to the hospital.  [facility] is not responsible for any belongings or valuables.  Contacts, dentures or bridgework may not be worn into surgery.  Leave your suitcase in the car.  After surgery it may be brought to your room.  For patients admitted to the hospital, discharge time will be determined by your treatment team.  Patients discharged the day of surgery will not be allowed to drive home.   Name and phone number of your driver:   Special instructions: [place] - Preparing for Surgery  Before surgery, you can play an important role.  Because skin is not  sterile, your skin needs to be as free of germs as possible.  You can reduce the number of germs on you skin by washing with CHG (chlorahexidine gluconate) soap before surgery.  CHG is an antiseptic cleaner which kills germs and bonds with the skin to continue killing germs even after washing.  Please DO NOT use if you have an allergy to CHG or antibacterial soaps.  If your skin becomes reddened/irritated stop using the CHG and inform your nurse when you arrive at Short Stay.  Do not shave (including legs and underarms) for at least 48 hours prior to the first CHG shower.  You may shave your face.  Please follow these instructions carefully:   1.  Shower with CHG Soap the night before surgery and the morning of Surgery.  2.  If you choose to wash your hair, wash your hair first as usual with your normal shampoo.  3.  After you shampoo, rinse your hair and body thoroughly to remove the Shampoo.  4.  Use CHG as you would any other liquid soap.  You can apply chg directly  to the skin and wash gently with scrungie or a clean washcloth.  5.  Apply the CHG Soap to your body ONLY FROM THE NECK DOWN.  Do not use on open wounds or open sores.  Avoid contact with your eyes, ears, mouth and genitals (private parts).  Wash genitals (private parts) with your normal soap.  6.  Wash thoroughly, paying special  attention to the area where your surgery will be performed.  7.  Thoroughly rinse your body with warm water from the neck down.  8.  DO NOT shower/wash with your normal soap after using and rinsing off the CHG Soap.  9.  Pat yourself dry with a clean towel.            10.  Wear clean pajamas.            11.  Place clean sheets on your bed the night of your first shower and do not sleep with pets.  Day of Surgery  Do not apply any lotions/deodorants the morning of surgery.  Please wear clean clothes to the hospital/surgery center.  Please read over the following fact sheets that you were given. Pain  Booklet, Coughing and Deep Breathing, Blood Transfusion Information, MRSA Information and Surgical Site Infection Prevention

## 2016-08-26 NOTE — Progress Notes (Signed)
Anesthesia Chart Review:  Pt is a 70 year old female scheduled for L3-4, L4-5 lumbar decompression and transforaminal lumbar interbody fusion on 08/30/2016 with Basil Dess, MD.   - Cardiologist is Minus Breeding, MD who has cleared pt for surgery and gave ok to stop ASA.  - EP cardiologist is Cristopher Peru, MD.  - PCP is Ernestene Kiel, MD   PMH includes:  Hx aortic stenosis, aortic aneurysm (s/p AVR, [place] 01/12/15), cardiomyopathy, BiV ICD ([employer]), asthma, COPD, vulvar cancer, adrenal tumor, post-op N/V, GERD. Current smoker. BMI 22  Medications include: albuterol, ASA, carvedilol, lasix, losartan, prilosec, zantac  Preoperative labs reviewed.    chest X-ray 08/23/16: No active cardiopulmonary disease.  EKG 02/05/16: electronic ventricular pacemaker  Echo 05/14/16:  - Left ventricle: The cavity size was normal. Wall thickness was normal. Systolic function was mildly to moderately reduced. The estimated ejection fraction was in the range of 40% to 45%. There is hypokinesis of the basalinferolateral myocardium. Features are consistent with a pseudonormal left ventricular filling pattern, with concomitant abnormal relaxation and increased filling pressure (grade 2 diastolic dysfunction). - Aortic valve: A bioprosthesis was present. There was trivial regurgitation. - Mitral valve: Calcified annulus. There was moderate regurgitation. - Left atrium: The atrium was moderately dilated. - Pulmonary arteries: Systolic pressure was mildly increased. PA peak pressure: 33 mm Hg (S). - Impressions: Hypokinesis of the basal inferolateral wall with overall mild to moderate LV dysfunction; grade 2 diastolic dysfunction; moderate LAE; bioprosthetic aortic valve with trace AI; moderate MR; mild TR with mildy elevated pulmonary pressure.  Cardiac cath 09/23/14:  - Final Conclusions:  Mild coronary plaque (25 LAD; 30% CX).  LV gradient mild but might be underestimated given estimate of LV function on echo.   Mild AI by cath.  Elevated EDP and significantly elevated pulmonary pressures.  O2 sats were low during the procedure.    Perioperative prescription for ICD notes pt is pacemaker dependent. Procedure will likely interfere with device. Disable tachy therapies, asynchronous pacing during procedure, return device to normal programming after surgery. PAT RN notes indicate [employer] rep has been notified of surgery date and time.   If no changes, I anticipate pt can proceed with surgery as scheduled.   Willeen Cass, FNP-BC [facility] Short Stay Surgical Center/Anesthesiology Phone: [phone] 08/26/2016 4:08 PM

## 2016-08-26 NOTE — Progress Notes (Signed)
Patient called about having to stop mucinex D. Spoke to Dr. Jillyn Hidden who stated patient could continue Mucinex D.

## 2016-08-29 MED ORDER — CEFAZOLIN SODIUM-DEXTROSE 2-4 GM/100ML-% IV SOLN
2.0000 g | INTRAVENOUS | Status: AC
  Administered 2016-08-30: 2 g via INTRAVENOUS
  Filled 2016-08-29: qty 100

## 2016-08-29 NOTE — H&P (Signed)
Carol Warren is an 70 y.o. female.   Ms. Estey returns today followup of her lumbar spine.  She has findings of L3-4 spondylolisthesis also degenerative changes at the L5-S1 level.  She has undergone some workup and has undergone recent eye surgery 2 weeks ago, has been waiting for this to heal.  She is noticing some decrease in the size of the left calf compared to the right.      Past Medical History:  Diagnosis Date  . Adrenal tumor   . Anxiety   . Aortic aneurysm ([place])   . Asthma   . Bicuspid aortic valve    CONGENITAL  . Cancer ([place])    skin   . Carpal tunnel syndrome, bilateral   . Chronic depression   . Chronic fatigue   . Chronic sinusitis   . 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 COPD (chronic obstructive pulmonary disease) ([place])   . Degenerative disc disease    CERVICAL AND LUMBAR (DR. Lorin [facility]cy)  . GERD (gastroesophageal reflux disease)   . Hammer toe of right foot   . Headache   . Heart murmur   . Hepatomegaly   . HSV-1 (herpes simplex virus 1) infection   . HSV-2 (herpes simplex virus 2) infection   . Insomnia   . Lumbar stenosis   . Osteoarthritis   . Osteopenia   . Pernicious anemia   . Plantar fasciitis   . Pneumonia   . PONV (postoperative nausea and vomiting)   . Shortness of breath dyspnea   . Smoker   . Venous insufficiency of left leg   . Vitamin B 12 deficiency     Past Surgical History:  Procedure Laterality Date  . AORTIC VALVE REPLACEMENT N/A 01/12/2015   Procedure: AORTIC VALVE REPLACEMENT (AVR);  Surgeon: Gaye Pollack, MD;  Location: [place];  Service: Open Heart Surgery;  Laterality: N/A;  . ASCENDING AORTIC ROOT REPLACEMENT N/A 01/12/2015   Procedure: ASCENDING AORTIC ROOT REPLACEMENT;  Surgeon: Gaye Pollack, MD;  Location: [place];  Service: Open Heart Surgery;  Laterality: N/A;  . BI-VENTRICULAR PACEMAKER INSERTION N/A 01/18/2015   Procedure: BI-VENTRICULAR PACEMAKER INSERTION (CRT-P);  Surgeon: Evans Lance, MD;  Location: [facility] CATH  LAB;  Service: Cardiovascular;  Laterality: N/A;  . BUNIONECTOMY WITH HAMMERTOE RECONSTRUCTION Right   . CARPAL TUNNEL RELEASE Bilateral 2008  . CATARACT EXTRACTION Bilateral    X2  . CATARACT EXTRACTION W/ INTRAOCULAR LENS  IMPLANT, BILATERAL    . COLONOSCOPY    . CYST EXCISION     right side of throat  . EXCISION OF BASAL CELL CA     SKIN   . EXCISION OF VULVAR CIS    . EYE SURGERY    . FUNCTIONAL ENDOSCOPIC SINUS SURGERY    . LEFT AND RIGHT HEART CATHETERIZATION WITH CORONARY ANGIOGRAM N/A 09/23/2014   Procedure: LEFT AND RIGHT HEART CATHETERIZATION WITH CORONARY ANGIOGRAM;  Surgeon: Minus Breeding, MD;  Location: [facility] CATH LAB;  Service: Cardiovascular;  Laterality: N/A;  . MULTIPLE TOOTH EXTRACTIONS    . SINUS PROCEDURE  2009   x 6  . TEE WITHOUT CARDIOVERSION N/A 10/06/2014   Procedure: TRANSESOPHAGEAL ECHOCARDIOGRAM (TEE);  Surgeon: Sueanne Margarita, MD;  Location: [facility] ENDOSCOPY;  Service: Cardiovascular;  Laterality: N/A;  . TEE WITHOUT CARDIOVERSION N/A 01/12/2015   Procedure: TRANSESOPHAGEAL ECHOCARDIOGRAM (TEE);  Surgeon: Gaye Pollack, MD;  Location: [place];  Service: Open Heart Surgery;  Laterality: N/A;  . UPPER GASTROINTESTINAL  ENDOSCOPY      Family History  Problem Relation Age of Onset  . Pancreatic cancer Mother 58  . Diabetes Father   . Hypertension Father   . Heart disease Father 81    CAD  . Breast cancer Sister   . Diabetes Maternal Grandmother   . Hypertension Maternal Grandmother   . Cancer Maternal Grandfather     colon or stomach  . Hypertension Paternal Grandmother   . Heart disease Paternal Grandmother     Later onset  . Diverticulosis Paternal Grandmother   . Asthma Grandchild    Social History:  reports that she has been smoking Cigarettes.  She has a 50.00 pack-year smoking history. She has never used smokeless tobacco. She reports that she does not drink alcohol or use drugs.  Allergies:  Allergies  Allergen Reactions  . Oxycodone Nausea Only     No prescriptions prior to admission.    No results found for this or any previous visit (from the past 48 hour(s)). No results found.  Review of Systems  Constitutional: Negative.   HENT: Negative.   Eyes: Negative.   Respiratory: Negative.   Cardiovascular: Negative.   Gastrointestinal: Negative.   Genitourinary: Negative.   Musculoskeletal: Positive for back pain.  Skin: Negative.   Neurological: Negative.   Endo/Heme/Allergies: Negative.   Psychiatric/Behavioral: Negative.     There were no vitals taken for this visit. Physical Exam  Constitutional: She is oriented to person, place, and time. She appears well-nourished. No distress.  HENT:  Head: Normocephalic and atraumatic.  Eyes: EOM are normal. Pupils are equal, round, and reactive to light.  Neck: Normal range of motion.  Cardiovascular: Normal rate.   Respiratory: Effort normal. No respiratory distress.  GI: Soft. She exhibits no distension.  Musculoskeletal: She exhibits tenderness.  Neurological: She is alert and oriented to person, place, and time.  Skin: Skin is warm and dry.  Psychiatric: She has a normal mood and affect.    PHYSICAL EXAMINATION:  There is atrophy of the left calf by about 1/4 inch.  She is having pain radiating in the left leg greater than right.  Her EHL is weak on the left and is 4/5.  Her foot dorsiflexion strength also slightly decreased on the left at 5-/5.  Popliteal compression sign is negative.  Dangling straight leg raise is negative.     ASSESSMENT:   Overall this patient has 2 level spondylolisthesis L3-4, L4-5 spinal stenosis findings.    PLAN:  Our plan is to go ahead and schedule her to undergo interbody fusions above L3-4 and L4-5, placement of pedicle screws using an impact technique, Concorde cages, Jackson frame, use of a Cell Saver, ViviGen, local bone graft and cancellous chips.  She is on Exparel and this will need to be discontinued prior to surgery, restarted after a  day or so.  Risks of surgery including infection, bleeding, neurologic compromise discussed with Zigmund Daniel.  She relates that she is unable to perform activities of daily living, any home chores at this point has been unable to do so for the last 1-1/2 to 2 years.  She has been through a therapy program.  I think it is appropriate to go ahead and schedule her and obtain clearance from her cardiologist and primary care physician.  Lanae Crumbly, PA-C 08/29/2016, 5:56 PM  Patient examined and lab reviewed with Ricard Dillon, PA-C.

## 2016-08-30 ENCOUNTER — Inpatient Hospital Stay (HOSPITAL_COMMUNITY): Payer: Medicare Other

## 2016-08-30 ENCOUNTER — Encounter (HOSPITAL_COMMUNITY): Payer: Self-pay | Admitting: Certified Registered Nurse Anesthetist

## 2016-08-30 ENCOUNTER — Inpatient Hospital Stay (HOSPITAL_COMMUNITY)
Admission: RE | Admit: 2016-08-30 | Discharge: 2016-09-02 | DRG: 460 | Disposition: A | Discharge status: Home Health | Payer: Medicare Other | Source: Ambulatory Visit | Attending: Specialist | Admitting: Specialist

## 2016-08-30 ENCOUNTER — Inpatient Hospital Stay (HOSPITAL_COMMUNITY): Payer: Medicare Other | Admitting: Emergency Medicine

## 2016-08-30 ENCOUNTER — Inpatient Hospital Stay (HOSPITAL_COMMUNITY): Payer: Medicare Other | Admitting: Certified Registered Nurse Anesthetist

## 2016-08-30 ENCOUNTER — Encounter (HOSPITAL_COMMUNITY)
Admission: RE | Disposition: A | Discharge status: Home Health | Payer: Self-pay | Source: Ambulatory Visit | Attending: Specialist

## 2016-08-30 DIAGNOSIS — M4806 Spinal stenosis, lumbar region: Secondary | ICD-10-CM | POA: Diagnosis present

## 2016-08-30 DIAGNOSIS — F17210 Nicotine dependence, cigarettes, uncomplicated: Secondary | ICD-10-CM | POA: Diagnosis present

## 2016-08-30 DIAGNOSIS — F419 Anxiety disorder, unspecified: Secondary | ICD-10-CM | POA: Diagnosis present

## 2016-08-30 DIAGNOSIS — Z961 Presence of intraocular lens: Secondary | ICD-10-CM | POA: Diagnosis present

## 2016-08-30 DIAGNOSIS — Z85828 Personal history of other malignant neoplasm of skin: Secondary | ICD-10-CM | POA: Diagnosis not present

## 2016-08-30 DIAGNOSIS — F329 Major depressive disorder, single episode, unspecified: Secondary | ICD-10-CM | POA: Diagnosis present

## 2016-08-30 DIAGNOSIS — Z419 Encounter for procedure for purposes other than remedying health state, unspecified: Secondary | ICD-10-CM

## 2016-08-30 DIAGNOSIS — K219 Gastro-esophageal reflux disease without esophagitis: Secondary | ICD-10-CM | POA: Diagnosis present

## 2016-08-30 DIAGNOSIS — J449 Chronic obstructive pulmonary disease, unspecified: Secondary | ICD-10-CM | POA: Diagnosis present

## 2016-08-30 DIAGNOSIS — Z950 Presence of cardiac pacemaker: Secondary | ICD-10-CM

## 2016-08-30 DIAGNOSIS — G4700 Insomnia, unspecified: Secondary | ICD-10-CM | POA: Diagnosis present

## 2016-08-30 DIAGNOSIS — M4316 Spondylolisthesis, lumbar region: Secondary | ICD-10-CM | POA: Diagnosis present

## 2016-08-30 DIAGNOSIS — M51369 Other intervertebral disc degeneration, lumbar region without mention of lumbar back pain or lower extremity pain: Secondary | ICD-10-CM | POA: Diagnosis present

## 2016-08-30 DIAGNOSIS — M5136 Other intervertebral disc degeneration, lumbar region: Secondary | ICD-10-CM | POA: Diagnosis present

## 2016-08-30 DIAGNOSIS — I872 Venous insufficiency (chronic) (peripheral): Secondary | ICD-10-CM | POA: Diagnosis present

## 2016-08-30 DIAGNOSIS — M48062 Spinal stenosis, lumbar region with neurogenic claudication: Secondary | ICD-10-CM | POA: Diagnosis present

## 2016-08-30 DIAGNOSIS — Z952 Presence of prosthetic heart valve: Secondary | ICD-10-CM

## 2016-08-30 DIAGNOSIS — Z9842 Cataract extraction status, left eye: Secondary | ICD-10-CM

## 2016-08-30 DIAGNOSIS — M545 Low back pain: Secondary | ICD-10-CM | POA: Diagnosis present

## 2016-08-30 DIAGNOSIS — Z9841 Cataract extraction status, right eye: Secondary | ICD-10-CM | POA: Diagnosis not present

## 2016-08-30 SURGERY — POSTERIOR LUMBAR FUSION 2 LEVEL
Anesthesia: General | Site: Spine Lumbar

## 2016-08-30 MED ORDER — OXYCODONE-ACETAMINOPHEN 5-325 MG PO TABS
1.0000 | ORAL_TABLET | ORAL | Status: DC | PRN
Start: 2016-08-30 — End: 2016-09-02
  Administered 2016-09-02: 1 via ORAL
  Filled 2016-08-30: qty 1

## 2016-08-30 MED ORDER — SACCHAROMYCES BOULARDII 250 MG PO CAPS
250.0000 mg | ORAL_CAPSULE | Freq: Every day | ORAL | Status: DC
  Administered 2016-08-31 – 2016-09-02 (×3): 250 mg via ORAL
  Filled 2016-08-30 (×3): qty 1

## 2016-08-30 MED ORDER — SODIUM CHLORIDE 0.9% FLUSH: 3.0000 mL | INTRAVENOUS | Status: DC | PRN

## 2016-08-30 MED ORDER — POLYVINYL ALCOHOL 1.4 % OP SOLN
1.0000 [drp] | Freq: Two times a day (BID) | OPHTHALMIC | Status: DC
  Administered 2016-08-30 – 2016-09-02 (×6): 1 [drp] via OPHTHALMIC
  Filled 2016-08-30: qty 15

## 2016-08-30 MED ORDER — SODIUM CHLORIDE 0.9 % IV SOLN
250.0000 mL | INTRAVENOUS | Status: DC
  Administered 2016-08-30: 250 mL via INTRAVENOUS

## 2016-08-30 MED ORDER — ONDANSETRON HCL 4 MG/2ML IJ SOLN
INTRAMUSCULAR | Status: AC
  Filled 2016-08-30: qty 2

## 2016-08-30 MED ORDER — BUPIVACAINE LIPOSOME 1.3 % IJ SUSP
20.0000 mL | INTRAMUSCULAR | Status: AC
  Administered 2016-08-30: 20 mL
  Filled 2016-08-30: qty 20

## 2016-08-30 MED ORDER — ROCURONIUM BROMIDE 100 MG/10ML IV SOLN
INTRAVENOUS | Status: DC | PRN
  Administered 2016-08-30: 50 mg via INTRAVENOUS
  Administered 2016-08-30: 10 mg via INTRAVENOUS

## 2016-08-30 MED ORDER — PROPOFOL 10 MG/ML IV BOLUS
INTRAVENOUS | Status: AC
  Filled 2016-08-30: qty 20

## 2016-08-30 MED ORDER — OLOPATADINE HCL 0.6 % NA SOLN: 2.0000 [drp] | Freq: Two times a day (BID) | NASAL | Status: DC

## 2016-08-30 MED ORDER — LACTATED RINGERS IV SOLN
INTRAVENOUS | Status: DC | PRN
  Administered 2016-08-30 (×2): via INTRAVENOUS

## 2016-08-30 MED ORDER — B COMPLEX-C PO TABS
1.0000 | ORAL_TABLET | Freq: Every day | ORAL | Status: DC
  Administered 2016-08-31 – 2016-09-02 (×3): 1 via ORAL
  Filled 2016-08-30 (×3): qty 1

## 2016-08-30 MED ORDER — 0.9 % SODIUM CHLORIDE (POUR BTL) OPTIME
TOPICAL | Status: DC | PRN
  Administered 2016-08-30: 1000 mL

## 2016-08-30 MED ORDER — PROPOFOL 500 MG/50ML IV EMUL
INTRAVENOUS | Status: DC | PRN
  Administered 2016-08-30: 25 ug/kg/min via INTRAVENOUS

## 2016-08-30 MED ORDER — MENTHOL 3 MG MT LOZG: 1.0000 | LOZENGE | OROMUCOSAL | Status: DC | PRN

## 2016-08-30 MED ORDER — DIAZEPAM 5 MG PO TABS
10.0000 mg | ORAL_TABLET | Freq: Three times a day (TID) | ORAL | Status: DC | PRN
  Administered 2016-08-31 – 2016-09-02 (×2): 10 mg via ORAL
  Filled 2016-08-30 (×2): qty 2

## 2016-08-30 MED ORDER — SUGAMMADEX SODIUM 200 MG/2ML IV SOLN
INTRAVENOUS | Status: DC | PRN
  Administered 2016-08-30: 200 mg via INTRAVENOUS

## 2016-08-30 MED ORDER — LIDOCAINE HCL (CARDIAC) 20 MG/ML IV SOLN
INTRAVENOUS | Status: DC | PRN
  Administered 2016-08-30: 80 mg via INTRATRACHEAL

## 2016-08-30 MED ORDER — FENTANYL CITRATE (PF) 100 MCG/2ML IJ SOLN
INTRAMUSCULAR | Status: AC
  Filled 2016-08-30: qty 6

## 2016-08-30 MED ORDER — CHLORHEXIDINE GLUCONATE 4 % EX LIQD: 60.0000 mL | Freq: Once | CUTANEOUS | Status: DC

## 2016-08-30 MED ORDER — HYDROMORPHONE HCL 1 MG/ML IJ SOLN
INTRAMUSCULAR | Status: AC
  Administered 2016-08-30: 0.5 mg via INTRAVENOUS
  Filled 2016-08-30: qty 1

## 2016-08-30 MED ORDER — PROMETHAZINE HCL 25 MG/ML IJ SOLN: 6.2500 mg | INTRAMUSCULAR | Status: DC | PRN

## 2016-08-30 MED ORDER — GLYCOPYRROLATE 0.2 MG/ML IJ SOLN
INTRAMUSCULAR | Status: DC | PRN
  Administered 2016-08-30: 0.2 mg via INTRAVENOUS

## 2016-08-30 MED ORDER — ONDANSETRON HCL 4 MG/2ML IJ SOLN
INTRAMUSCULAR | Status: DC | PRN
  Administered 2016-08-30: 4 mg via INTRAVENOUS

## 2016-08-30 MED ORDER — THROMBIN 20000 UNITS EX SOLR
CUTANEOUS | Status: DC | PRN
  Administered 2016-08-30: 20 mL via TOPICAL

## 2016-08-30 MED ORDER — CYANOCOBALAMIN 1000 MCG/ML IJ SOLN: 1000.0000 ug | INTRAMUSCULAR | Status: DC

## 2016-08-30 MED ORDER — MIDAZOLAM HCL 2 MG/2ML IJ SOLN
INTRAMUSCULAR | Status: DC | PRN
  Administered 2016-08-30 (×2): 1 mg via INTRAVENOUS

## 2016-08-30 MED ORDER — ALUM & MAG HYDROXIDE-SIMETH 200-200-20 MG/5ML PO SUSP
30.0000 mL | Freq: Four times a day (QID) | ORAL | Status: DC | PRN
Start: 2016-08-30 — End: 2016-09-02

## 2016-08-30 MED ORDER — PHENYLEPHRINE 40 MCG/ML (10ML) SYRINGE FOR IV PUSH (FOR BLOOD PRESSURE SUPPORT)
PREFILLED_SYRINGE | INTRAVENOUS | Status: AC
  Filled 2016-08-30: qty 10

## 2016-08-30 MED ORDER — ACETAMINOPHEN 325 MG PO TABS: 650.0000 mg | ORAL_TABLET | ORAL | Status: DC | PRN

## 2016-08-30 MED ORDER — FLEET ENEMA 7-19 GM/118ML RE ENEM: 1.0000 | ENEMA | Freq: Once | RECTAL | Status: DC | PRN

## 2016-08-30 MED ORDER — ALBUTEROL SULFATE HFA 108 (90 BASE) MCG/ACT IN AERS: 1.0000 | INHALATION_SPRAY | Freq: Four times a day (QID) | RESPIRATORY_TRACT | Status: DC | PRN

## 2016-08-30 MED ORDER — POLYETHYLENE GLYCOL 3350 17 G PO PACK: 17.0000 g | PACK | Freq: Every day | ORAL | Status: DC | PRN

## 2016-08-30 MED ORDER — CEFAZOLIN IN D5W 1 GM/50ML IV SOLN
1.0000 g | Freq: Three times a day (TID) | INTRAVENOUS | Status: AC
  Administered 2016-08-30 (×2): 1 g via INTRAVENOUS
  Filled 2016-08-30 (×2): qty 50

## 2016-08-30 MED ORDER — BUPIVACAINE HCL (PF) 0.5 % IJ SOLN
INTRAMUSCULAR | Status: AC
  Filled 2016-08-30: qty 30

## 2016-08-30 MED ORDER — ARTIFICIAL TEARS OP OINT
1.0000 | TOPICAL_OINTMENT | Freq: Two times a day (BID) | OPHTHALMIC | Status: DC
Start: 2016-08-30 — End: 2016-08-30
  Administered 2016-08-30: 1 via OPHTHALMIC
  Filled 2016-08-30: qty 3.5

## 2016-08-30 MED ORDER — HYDROCODONE-ACETAMINOPHEN 5-325 MG PO TABS
1.0000 | ORAL_TABLET | ORAL | Status: DC | PRN
  Administered 2016-08-30: 2 via ORAL
  Administered 2016-08-31: 1 via ORAL
  Administered 2016-08-31 – 2016-09-02 (×8): 2 via ORAL
  Filled 2016-08-30: qty 2
  Filled 2016-08-30: qty 1
  Filled 2016-08-30 (×2): qty 2
  Filled 2016-08-30: qty 1
  Filled 2016-08-30 (×5): qty 2

## 2016-08-30 MED ORDER — KETOROLAC TROMETHAMINE 30 MG/ML IJ SOLN
30.0000 mg | Freq: Once | INTRAMUSCULAR | Status: AC
  Administered 2016-08-30: 30 mg via INTRAVENOUS
  Filled 2016-08-30: qty 1

## 2016-08-30 MED ORDER — OLOPATADINE HCL 0.1 % OP SOLN
1.0000 [drp] | Freq: Two times a day (BID) | OPHTHALMIC | Status: DC
  Administered 2016-08-31: 1 [drp] via OPHTHALMIC
  Filled 2016-08-30: qty 5

## 2016-08-30 MED ORDER — SUGAMMADEX SODIUM 200 MG/2ML IV SOLN
INTRAVENOUS | Status: AC
  Filled 2016-08-30: qty 2

## 2016-08-30 MED ORDER — LORATADINE 10 MG PO TABS
10.0000 mg | ORAL_TABLET | Freq: Every day | ORAL | Status: DC
  Administered 2016-08-31 – 2016-09-02 (×3): 10 mg via ORAL
  Filled 2016-08-30 (×2): qty 1

## 2016-08-30 MED ORDER — KETAMINE HCL-SODIUM CHLORIDE 100-0.9 MG/10ML-% IV SOSY
PREFILLED_SYRINGE | INTRAVENOUS | Status: AC
  Filled 2016-08-30: qty 10

## 2016-08-30 MED ORDER — CALCIUM CARBONATE-VITAMIN D 500-200 MG-UNIT PO TABS
1.0000 | ORAL_TABLET | Freq: Every day | ORAL | Status: DC
  Administered 2016-08-31 – 2016-09-02 (×3): 1 via ORAL
  Filled 2016-08-30 (×3): qty 1

## 2016-08-30 MED ORDER — KETAMINE HCL 10 MG/ML IJ SOLN
INTRAMUSCULAR | Status: DC | PRN
  Administered 2016-08-30 (×2): 20 mg via INTRAVENOUS

## 2016-08-30 MED ORDER — DEXTROSE 5 % IV SOLN
INTRAVENOUS | Status: DC | PRN
  Administered 2016-08-30: 100 ug/min via INTRAVENOUS

## 2016-08-30 MED ORDER — FAMOTIDINE 20 MG PO TABS
20.0000 mg | ORAL_TABLET | Freq: Every day | ORAL | Status: DC
  Administered 2016-08-31 – 2016-09-02 (×3): 20 mg via ORAL
  Filled 2016-08-30 (×3): qty 1

## 2016-08-30 MED ORDER — MUSCLE RUB 10-15 % EX CREA
1.0000 "application " | TOPICAL_CREAM | Freq: Every day | CUTANEOUS | Status: DC
  Administered 2016-08-31 – 2016-09-02 (×2): 1 via TOPICAL
  Filled 2016-08-30: qty 85

## 2016-08-30 MED ORDER — SUCCINYLCHOLINE CHLORIDE 200 MG/10ML IV SOSY
PREFILLED_SYRINGE | INTRAVENOUS | Status: AC
  Filled 2016-08-30: qty 10

## 2016-08-30 MED ORDER — KCL IN DEXTROSE-NACL 10-5-0.45 MEQ/L-%-% IV SOLN
INTRAVENOUS | Status: DC
  Administered 2016-08-30 – 2016-08-31 (×2): via INTRAVENOUS
  Filled 2016-08-30 (×4): qty 1000

## 2016-08-30 MED ORDER — THROMBIN 20000 UNITS EX SOLR
CUTANEOUS | Status: AC
  Filled 2016-08-30: qty 20000

## 2016-08-30 MED ORDER — ARTIFICIAL TEARS OP OINT
TOPICAL_OINTMENT | OPHTHALMIC | Status: DC | PRN
  Administered 2016-08-30: 1 via OPHTHALMIC

## 2016-08-30 MED ORDER — FLUTICASONE PROPIONATE 50 MCG/ACT NA SUSP
1.0000 | Freq: Two times a day (BID) | NASAL | Status: DC
  Administered 2016-08-30 – 2016-09-02 (×6): 1 via NASAL
  Filled 2016-08-30: qty 16

## 2016-08-30 MED ORDER — BUPIVACAINE HCL 0.5 % IJ SOLN
INTRAMUSCULAR | Status: DC | PRN
  Administered 2016-08-30: 20 mL

## 2016-08-30 MED ORDER — EPHEDRINE SULFATE 50 MG/ML IJ SOLN
INTRAMUSCULAR | Status: DC | PRN
  Administered 2016-08-30: 10 mg via INTRAVENOUS

## 2016-08-30 MED ORDER — DOCUSATE SODIUM 100 MG PO CAPS
100.0000 mg | ORAL_CAPSULE | Freq: Two times a day (BID) | ORAL | Status: DC
  Administered 2016-08-30 – 2016-09-02 (×6): 100 mg via ORAL
  Filled 2016-08-30 (×6): qty 1

## 2016-08-30 MED ORDER — SODIUM CHLORIDE 0.9% FLUSH
3.0000 mL | Freq: Two times a day (BID) | INTRAVENOUS | Status: DC
  Administered 2016-08-30 – 2016-09-02 (×7): 3 mL via INTRAVENOUS

## 2016-08-30 MED ORDER — ASPIRIN EC 325 MG PO TBEC
325.0000 mg | DELAYED_RELEASE_TABLET | Freq: Every day | ORAL | Status: DC
  Administered 2016-08-31 – 2016-09-02 (×3): 325 mg via ORAL
  Filled 2016-08-30 (×3): qty 1

## 2016-08-30 MED ORDER — MORPHINE SULFATE (PF) 2 MG/ML IV SOLN
1.0000 mg | INTRAVENOUS | Status: DC | PRN
  Administered 2016-08-30 (×2): 2 mg via INTRAVENOUS
  Filled 2016-08-30 (×2): qty 1

## 2016-08-30 MED ORDER — DM-GUAIFENESIN ER 30-600 MG PO TB12
1.0000 | ORAL_TABLET | Freq: Two times a day (BID) | ORAL | Status: DC
  Administered 2016-08-30 – 2016-09-02 (×6): 1 via ORAL
  Filled 2016-08-30 (×6): qty 1

## 2016-08-30 MED ORDER — METHOCARBAMOL 1000 MG/10ML IJ SOLN
500.0000 mg | Freq: Four times a day (QID) | INTRAMUSCULAR | Status: DC | PRN
  Filled 2016-08-30: qty 5

## 2016-08-30 MED ORDER — PHENOL 1.4 % MT LIQD: 1.0000 | OROMUCOSAL | Status: DC | PRN

## 2016-08-30 MED ORDER — ROCURONIUM BROMIDE 10 MG/ML (PF) SYRINGE
PREFILLED_SYRINGE | INTRAVENOUS | Status: AC
  Filled 2016-08-30: qty 10

## 2016-08-30 MED ORDER — PANTOPRAZOLE SODIUM 40 MG PO TBEC
40.0000 mg | DELAYED_RELEASE_TABLET | Freq: Every day | ORAL | Status: DC
  Administered 2016-08-31 – 2016-09-02 (×3): 40 mg via ORAL
  Filled 2016-08-30 (×3): qty 1

## 2016-08-30 MED ORDER — THROMBIN 20000 UNITS EX SOLR: CUTANEOUS | Status: DC | PRN

## 2016-08-30 MED ORDER — NICOTINE 21 MG/24HR TD PT24
21.0000 mg | MEDICATED_PATCH | Freq: Every day | TRANSDERMAL | Status: DC
  Administered 2016-08-30 – 2016-09-01 (×3): 21 mg via TRANSDERMAL
  Filled 2016-08-30 (×3): qty 1

## 2016-08-30 MED ORDER — ACETAMINOPHEN 650 MG RE SUPP: 650.0000 mg | RECTAL | Status: DC | PRN

## 2016-08-30 MED ORDER — MIDAZOLAM HCL 2 MG/2ML IJ SOLN
INTRAMUSCULAR | Status: AC
  Filled 2016-08-30: qty 2

## 2016-08-30 MED ORDER — ARTIFICIAL TEARS OP OINT
TOPICAL_OINTMENT | OPHTHALMIC | Status: AC
  Filled 2016-08-30: qty 3.5

## 2016-08-30 MED ORDER — CARVEDILOL 12.5 MG PO TABS
12.5000 mg | ORAL_TABLET | Freq: Two times a day (BID) | ORAL | Status: DC
  Administered 2016-08-30 – 2016-09-02 (×6): 12.5 mg via ORAL
  Filled 2016-08-30 (×6): qty 1

## 2016-08-30 MED ORDER — LOSARTAN POTASSIUM 50 MG PO TABS
50.0000 mg | ORAL_TABLET | Freq: Every day | ORAL | Status: DC
  Administered 2016-08-30 – 2016-09-02 (×4): 50 mg via ORAL
  Filled 2016-08-30 (×4): qty 1

## 2016-08-30 MED ORDER — HALOBETASOL-AMMONIUM LACTATE 0.05 & 12 % (CREAM) EX KIT
1.0000 | PACK | Freq: Every day | CUTANEOUS | Status: DC | PRN
Start: 2016-08-30 — End: 2016-08-31

## 2016-08-30 MED ORDER — HYDROCODONE-ACETAMINOPHEN 5-325 MG PO TABS
1.0000 | ORAL_TABLET | Freq: Four times a day (QID) | ORAL | Status: DC | PRN
  Filled 2016-08-30: qty 1

## 2016-08-30 MED ORDER — FUROSEMIDE 20 MG PO TABS
20.0000 mg | ORAL_TABLET | Freq: Every day | ORAL | Status: DC
  Administered 2016-08-31 – 2016-09-02 (×3): 20 mg via ORAL
  Filled 2016-08-30 (×3): qty 1

## 2016-08-30 MED ORDER — ONDANSETRON HCL 4 MG/2ML IJ SOLN
4.0000 mg | INTRAMUSCULAR | Status: DC | PRN
Start: 2016-08-30 — End: 2016-09-02

## 2016-08-30 MED ORDER — FENTANYL CITRATE (PF) 100 MCG/2ML IJ SOLN
INTRAMUSCULAR | Status: DC | PRN
  Administered 2016-08-30 (×2): 50 ug via INTRAVENOUS
  Administered 2016-08-30 (×2): 25 ug via INTRAVENOUS
  Administered 2016-08-30: 100 ug via INTRAVENOUS
  Administered 2016-08-30: 50 ug via INTRAVENOUS

## 2016-08-30 MED ORDER — BISACODYL 5 MG PO TBEC: 5.0000 mg | DELAYED_RELEASE_TABLET | Freq: Every day | ORAL | Status: DC | PRN

## 2016-08-30 MED ORDER — METHOCARBAMOL 500 MG PO TABS
500.0000 mg | ORAL_TABLET | Freq: Four times a day (QID) | ORAL | Status: DC | PRN
  Administered 2016-08-31 – 2016-09-02 (×3): 500 mg via ORAL
  Filled 2016-08-30 (×3): qty 1

## 2016-08-30 MED ORDER — MONTELUKAST SODIUM 10 MG PO TABS
10.0000 mg | ORAL_TABLET | Freq: Every day | ORAL | Status: DC
  Administered 2016-08-30 – 2016-09-01 (×3): 10 mg via ORAL
  Filled 2016-08-30 (×3): qty 1

## 2016-08-30 MED ORDER — LIDOCAINE 2% (20 MG/ML) 5 ML SYRINGE
INTRAMUSCULAR | Status: AC
  Filled 2016-08-30: qty 5

## 2016-08-30 MED ORDER — PHENYLEPHRINE HCL 10 MG/ML IJ SOLN
INTRAMUSCULAR | Status: DC | PRN
  Administered 2016-08-30: 200 ug via INTRAVENOUS
  Administered 2016-08-30: 120 ug via INTRAVENOUS
  Administered 2016-08-30: 200 ug via INTRAVENOUS

## 2016-08-30 MED ORDER — ADULT MULTIVITAMIN W/MINERALS CH
1.0000 | ORAL_TABLET | Freq: Every day | ORAL | Status: DC
  Administered 2016-08-31 – 2016-09-02 (×3): 1 via ORAL
  Filled 2016-08-30 (×3): qty 1

## 2016-08-30 MED ORDER — PROPOFOL 1000 MG/100ML IV EMUL
INTRAVENOUS | Status: AC
  Filled 2016-08-30: qty 100

## 2016-08-30 MED ORDER — ALBUTEROL SULFATE (2.5 MG/3ML) 0.083% IN NEBU: 2.5000 mg | INHALATION_SOLUTION | Freq: Two times a day (BID) | RESPIRATORY_TRACT | Status: DC | PRN

## 2016-08-30 MED ORDER — FLUOXETINE HCL 20 MG PO CAPS
20.0000 mg | ORAL_CAPSULE | Freq: Every day | ORAL | Status: DC
  Administered 2016-08-31 – 2016-09-02 (×3): 20 mg via ORAL
  Filled 2016-08-30 (×3): qty 1

## 2016-08-30 MED ORDER — GLYCOPYRROLATE 0.2 MG/ML IV SOSY
PREFILLED_SYRINGE | INTRAVENOUS | Status: AC
  Filled 2016-08-30: qty 3

## 2016-08-30 MED ORDER — HYDROMORPHONE HCL 1 MG/ML IJ SOLN
0.2500 mg | INTRAMUSCULAR | Status: DC | PRN
  Administered 2016-08-30 (×4): 0.5 mg via INTRAVENOUS

## 2016-08-30 SURGICAL SUPPLY — 79 items
ADH SKN CLS APL DERMABOND .7 (GAUZE/BANDAGES/DRESSINGS) ×1
AGENT HMST MTR 8 SURGIFLO (HEMOSTASIS) ×1
APL SKNCLS STERI-STRIP NONHPOA (GAUZE/BANDAGES/DRESSINGS) ×1
BENZOIN TINCTURE PRP APPL 2/3 (GAUZE/BANDAGES/DRESSINGS) ×2 IMPLANT
BLADE SURG ROTATE 9660 (MISCELLANEOUS) IMPLANT
BONE CANC CHIPS 20CC PCAN1/4 (Bone Implant) ×3 IMPLANT
BONE MATRIX VIVIGEN 1CC (Bone Implant) ×4 IMPLANT
BUR MATCHSTICK NEURO 3.0 LAGG (BURR) ×3 IMPLANT
BUR RND FLUTED 2.5 (BURR) IMPLANT
BUR SABER RD CUTTING 3.0 (BURR) IMPLANT
BUR SABER RD CUTTING 3.0MM (BURR)
CAGE CONCORDE BULLET 9X10X23 (Cage) ×6 IMPLANT
CAGE SPNL 5D BLT NOSE 23X9X10 (Cage) IMPLANT
CHIPS CANC BONE 20CC PCAN1/4 (Bone Implant) ×1 IMPLANT
CLOSURE STERI-STRIP 1/2X4 (GAUZE/BANDAGES/DRESSINGS) ×1
CLSR STERI-STRIP ANTIMIC 1/2X4 (GAUZE/BANDAGES/DRESSINGS) ×1 IMPLANT
COVER MAYO STAND STRL (DRAPES) ×3 IMPLANT
COVER SURGICAL LIGHT HANDLE (MISCELLANEOUS) ×3 IMPLANT
DERMABOND ADVANCED (GAUZE/BANDAGES/DRESSINGS) ×2
DERMABOND ADVANCED .7 DNX12 (GAUZE/BANDAGES/DRESSINGS) ×1 IMPLANT
DRAPE C-ARM 42X72 X-RAY (DRAPES) ×4 IMPLANT
DRAPE C-ARMOR (DRAPES) ×3 IMPLANT
DRAPE MICROSCOPE LEICA (MISCELLANEOUS) ×3 IMPLANT
DRAPE SURG 17X23 STRL (DRAPES) ×9 IMPLANT
DRAPE TABLE COVER HEAVY DUTY (DRAPES) ×3 IMPLANT
DRSG MEPILEX BORDER 4X4 (GAUZE/BANDAGES/DRESSINGS) IMPLANT
DRSG MEPILEX BORDER 4X8 (GAUZE/BANDAGES/DRESSINGS) ×2 IMPLANT
DURAPREP 26ML APPLICATOR (WOUND CARE) ×3 IMPLANT
ELECT BLADE 6.5 EXT (BLADE) ×3 IMPLANT
ELECT CAUTERY BLADE 6.4 (BLADE) ×3 IMPLANT
ELECT REM PT RETURN 9FT ADLT (ELECTROSURGICAL) ×3
ELECTRODE REM PT RTRN 9FT ADLT (ELECTROSURGICAL) ×1 IMPLANT
EVACUATOR 1/8 PVC DRAIN (DRAIN) IMPLANT
GLOVE BIOGEL PI IND STRL 8 (GLOVE) ×2 IMPLANT
GLOVE BIOGEL PI INDICATOR 8 (GLOVE) ×4
GLOVE ECLIPSE 9.0 STRL (GLOVE) ×6 IMPLANT
GLOVE ORTHO TXT STRL SZ7.5 (GLOVE) ×6 IMPLANT
GLOVE SURG 8.5 LATEX PF (GLOVE) ×5 IMPLANT
GOWN STRL REUS W/ TWL LRG LVL3 (GOWN DISPOSABLE) ×1 IMPLANT
GOWN STRL REUS W/TWL 2XL LVL3 (GOWN DISPOSABLE) ×10 IMPLANT
GOWN STRL REUS W/TWL LRG LVL3 (GOWN DISPOSABLE)
KIT BASIN OR (CUSTOM PROCEDURE TRAY) ×3 IMPLANT
KIT POSITION SURG JACKSON T1 (MISCELLANEOUS) ×5 IMPLANT
KIT ROOM TURNOVER OR (KITS) ×3 IMPLANT
MANIFOLD NEPTUNE II (INSTRUMENTS) ×3 IMPLANT
NDL ASP BONE MRW 11GX15 (NEEDLE) IMPLANT
NEEDLE 22X1 1/2 (OR ONLY) (NEEDLE) ×3 IMPLANT
NEEDLE ASP BONE MRW 11GX15 (NEEDLE) IMPLANT
NEEDLE BONE MARROW 8GAX6 (NEEDLE) IMPLANT
NEEDLE SPNL 18GX3.5 QUINCKE PK (NEEDLE) ×6 IMPLANT
NS IRRIG 1000ML POUR BTL (IV SOLUTION) ×5 IMPLANT
PACK LAMINECTOMY ORTHO (CUSTOM PROCEDURE TRAY) ×3 IMPLANT
PAD ARMBOARD 7.5X6 YLW CONV (MISCELLANEOUS) ×6 IMPLANT
PATTIES SURGICAL .75X.75 (GAUZE/BANDAGES/DRESSINGS) IMPLANT
PATTIES SURGICAL 1X1 (DISPOSABLE) ×3 IMPLANT
ROD EXPEDIUM PER BENT 65MM (Rod) ×4 IMPLANT
SCREW CORTICAL VIPER 7X35 (Screw) ×12 IMPLANT
SCREW SET SINGLE INNER (Screw) ×12 IMPLANT
SCREW VIPER CORT FIX 6X35 (Screw) ×6 IMPLANT
SPOGE SURGIFLO 8M (HEMOSTASIS) ×2
SPONGE LAP 4X18 X RAY DECT (DISPOSABLE) ×2 IMPLANT
SPONGE SURGIFLO 8M (HEMOSTASIS) IMPLANT
SPONGE SURGIFOAM ABS GEL 100 (HEMOSTASIS) ×3 IMPLANT
SURGIFLO W/THROMBIN 8M KIT (HEMOSTASIS) IMPLANT
SUT VIC AB 0 CT1 27 (SUTURE) ×3
SUT VIC AB 0 CT1 27XBRD ANBCTR (SUTURE) ×1 IMPLANT
SUT VIC AB 1 CTX 36 (SUTURE) ×6
SUT VIC AB 1 CTX36XBRD ANBCTR (SUTURE) ×2 IMPLANT
SUT VIC AB 2-0 CT1 27 (SUTURE) ×3
SUT VIC AB 2-0 CT1 TAPERPNT 27 (SUTURE) ×1 IMPLANT
SUT VIC AB 3-0 X1 27 (SUTURE) ×5 IMPLANT
SUT VICRYL 0 CT 1 36IN (SUTURE) ×3 IMPLANT
SYR 20CC LL (SYRINGE) ×3 IMPLANT
SYR CONTROL 10ML LL (SYRINGE) ×6 IMPLANT
TOWEL OR 17X24 6PK STRL BLUE (TOWEL DISPOSABLE) ×3 IMPLANT
TOWEL OR 17X26 10 PK STRL BLUE (TOWEL DISPOSABLE) ×3 IMPLANT
TRAY FOLEY CATH 16FRSI W/METER (SET/KITS/TRAYS/PACK) ×3 IMPLANT
WATER STERILE IRR 1000ML POUR (IV SOLUTION) IMPLANT
YANKAUER SUCT BULB TIP NO VENT (SUCTIONS) ×3 IMPLANT

## 2016-08-30 NOTE — Transfer of Care (Signed)
Immediate Anesthesia Transfer of Care Note  Patient: Carol Warren  Procedure(s) Performed: Procedure(s): Lumbar Decompression and Fusion L3-4 and L4-5 Transforaminal Lumbar Interbody Fusion with cages, pedicle screws and rods L3 to L5 (N/A)  Patient Location: PACU  Anesthesia Type:General  Level of Consciousness: awake, alert  and patient cooperative  Airway & Oxygen Therapy: Patient Spontanous Breathing and Patient connected to face mask oxygen  Post-op Assessment: Report given to RN, Post -op Vital signs reviewed and stable, Patient moving all extremities X 4 and Patient able to stick tongue midline  Post vital signs: Reviewed and stable  Last Vitals:  Vitals:   08/30/16 0620  BP: (!) 150/66  Pulse: 77  Resp: 18  Temp: 36.8 C    Last Pain:  Vitals:   08/30/16 0641  TempSrc:   PainSc: 7          Complications: No apparent anesthesia complications

## 2016-08-30 NOTE — Anesthesia Preprocedure Evaluation (Addendum)
Anesthesia Evaluation  Patient identified by MRN, date of birth, ID band Patient awake    Reviewed: Allergy & Precautions, Patient's Chart, lab work & pertinent test results  History of Anesthesia Complications (+) PONV  Airway Mallampati: II  TM Distance: >3 FB Neck ROM: Full    Dental  (+) Edentulous Upper   Pulmonary shortness of breath, asthma , COPD, Current Smoker,  dissappointing in that she continues to smoke up to 2 packs /day   breath sounds clear to auscultation       Cardiovascular + Peripheral Vascular Disease  + dysrhythmias + Valvular Problems/Murmurs  Rhythm:Regular Rate:Normal  S/P AVR   Neuro/Psych    GI/Hepatic GERD  ,  Endo/Other    Renal/GU      Musculoskeletal   Abdominal   Peds  Hematology  (+) anemia ,   Anesthesia Other Findings   Reproductive/Obstetrics                            Anesthesia Physical Anesthesia Plan  ASA: IV  Anesthesia Plan: General   Post-op Pain Management:    Induction: Intravenous  Airway Management Planned: Oral ETT  Additional Equipment: Arterial line  Intra-op Plan:   Post-operative Plan: Possible Post-op intubation/ventilation and Extubation in OR  Informed Consent: I have reviewed the patients History and Physical, chart, labs and discussed the procedure including the risks, benefits and alternatives for the proposed anesthesia with the patient or authorized representative who has indicated his/her understanding and acceptance.   Dental advisory given  Plan Discussed with:   Anesthesia Plan Comments:        Anesthesia Quick Evaluation

## 2016-08-30 NOTE — Progress Notes (Signed)
Orthopedic Tech Progress Note Patient Details:  [name] 02-18-1946 [id number]  Patient ID: Carol Warren, female   DOB: 06/19/1946, 70 y.o.   MRN: [id number]   Carol Warren 08/30/2016, 12:47 PMCalled [employer] for Lumbar brace.

## 2016-08-30 NOTE — Anesthesia Procedure Notes (Signed)
Procedure Name: Intubation Date/Time: 08/30/2016 8:08 AM Performed by: Rica Koyanagi Pre-anesthesia Checklist: Patient identified, Emergency Drugs available, Suction available and Patient being monitored Patient Re-evaluated:Patient Re-evaluated prior to inductionOxygen Delivery Method: Circle system utilized Preoxygenation: Pre-oxygenation with 100% oxygen Intubation Type: IV induction Ventilation: Oral airway inserted - appropriate to patient size Laryngoscope Size: Mac and 3 Grade View: Grade I Tube type: Oral Tube size: 7.0 mm Number of attempts: 1 Airway Equipment and Method: Stylet Placement Confirmation: ETT inserted through vocal cords under direct vision,  positive ETCO2 and breath sounds checked- equal and bilateral Secured at: 22 cm Tube secured with: Tape Dental Injury: Teeth and Oropharynx as per pre-operative assessment

## 2016-08-30 NOTE — Brief Op Note (Signed)
PATIENT ID:      Carol Warren  MRN:     [id number] DOB/AGE:    [birth year] / 69 y.o.       OPERATIVE REPORT   DATE OF PROCEDURE:  08/30/2016      PREOPERATIVE DIAGNOSIS:   L3-4 and L4-5 spondylolisthesis, lumbar spinal stenosis                                                       Body mass index is 21.73 kg/m.    POSTOPERATIVE DIAGNOSIS:   L3-4 and L4-5 spondylolisthesis, lumbar spinal stenosis                                                                     Body mass index is 21.73 kg/m.    PROCEDURE:  Procedure(s): Lumbar Decompression and Fusion L3-4 and L4-5 Transforaminal Lumbar Interbody Fusion with cages, pedicle screws and rods L3 to L5    SURGEON: NITKA,JAMES E   ASSISTANT: Esaw Grandchild          ANESTHESIA:  General and supplemented with local marcaine 0.5% 1:1 exparel 1.3% total 40cc. Dr. Orene Desanctis  EBL:150cc  DRAINS: Foley to SD.  COMPONENTS:   Implant Name Type Inv. Item Serial No. Manufacturer Lot No. LRB No. Used  BONE CANC CHIPS 20CC - [id number] Bone Implant BONE [facility] CHIPS 20CC [id number] [employer] [place] [facility]  N/A 1  BONE MATRIX VIVIGEN 1CC - [id number] Bone Implant BONE [employer]IV[employer] 1CC [id number] [employer] [place] [facility]  N/A 1  BONE MATRIX VIVIGEN 1CC - [id number] Bone Implant BONE [employer] 1CC [id number] [employer] [place] [facility]  N/A 1  SCREW CORTICAL VIPER 7X35 - [id number] Screw SCREW CORTICAL VIPER 7X35  DEPUY SPINE  N/A 4  SCREW VIPER CORT FIX 4.35X21 - [id number] Screw SCREW VIPER CORT FIX 4.35X21  DEPUY SPINE  N/A 2  CAGE CONCORDE BULLET 9X10X23 - [id number] Cage CAGE CONCORDE BULLET 9X10X23  DEPUY SPINE  N/A 2  ROD EXPEDIUM PER BENT 65MM - [id number] Rod ROD EXPEDIUM PER BENT 65MM  DEPUY SPINE  N/A 2  SCREW SET SINGLE INNER - [id number] Screw SCREW SET SINGLE INNER   DEPUY SPINE   N/A 6    COMPLICATIONS:  None   CONDITION:  stable    NITKA,JAMES E 08/30/2016, 11:16 AM

## 2016-08-30 NOTE — Progress Notes (Signed)
Pt received from PACU drowsy, with no noted distress. O2 via n/c denying shortness of breath or dyspnea. Pt stable, neuro intact. Surgical honeycomb dressing dry and intact. Safety measures in place. Pt oriented to room. Call bell within reach.

## 2016-08-30 NOTE — Op Note (Addendum)
08/30/2016  11:38 AM  PATIENT:  Carol Warren  70 y.o. female  MRN: [id number]  OPERATIVE REPORT  PRE-OPERATIVE DIAGNOSIS:  L3-4 and L4-5 spondylolisthesis, lumbar spinal stenosis  POST-OPERATIVE DIAGNOSIS:  L3-4 and L4-5 spondylolisthesis, lumbar spinal stenosis  PROCEDURE:  Procedure(s): Lumbar Decompression and Fusion L3-4 and L4-5 Transforaminal Lumbar Interbody Fusion with cages, pedicle screws and rods L3 to L5    SURGEON:  Jessy Oto, MD     ASSISTANT: Benjiman Core, PA-C  (Present throughout the entire procedure and necessary for completion of procedure in a timely manner)     ANESTHESIA:  General,supplemented with local anesthestic marcaine 0.5% 1:1 exparel 1.3% total 40cc. Dr. Orene Desanctis.    COMPLICATIONS:  None.   EBL: 150cc  DRAINS: Foley to SD.    COMPONENTS:  Implant Name Type Inv. Item Serial No. Manufacturer Lot No. LRB No. Used  BONE CANC CHIPS 20CC - [id number] Bone Implant BONE [facility] CHIPS 20CC [id number] [employer] [place] [facility]  N/A 1  BONE MATRIX VIVIGEN 1CC - [id number] Bone Implant BONE [employer] VIVIGEN 1CC [id number] [facility] [place] [facility]  N/A 1  BONE MATRIX VIVIGEN 1CC - [id number] Bone Implant BONE [employer]IGEN 1CC [id number] [facility] [place] [facility]  N/A 1  SCREW CORTICAL VIPER 7X35 - [id number] Screw SCREW CORTICAL VIPER 7X35  DEPUY SPINE  N/A 4  SCREW VIPER CORT FIX 4.35X21 - [id number] Screw SCREW VIPER CORT FIX 4.35X21  DEPUY SPINE  N/A 2  CAGE CONCORDE BULLET 9X10X23 - [id number] Cage CAGE CONCORDE BULLET 9X10X23  DEPUY SPINE  N/A 2  ROD EXPEDIUM PER BENT 65MM - [id number] Rod ROD EXPEDIUM PER BENT 65MM  DEPUY SPINE  N/A 2  SCREW SET SINGLE INNER - [id number] Screw SCREW SET SINGLE INNER   DEPUY SPINE   N/A 6     PROCEDURE:    The patient was met in the holding area, and the appropriate lumbar levels right L4-5 and L3-4 identified and marked with an "X" and my initials. I had discussion with the patient in the preop  holding area regarding a change of consent form.The fusion levels are reidentified as L4-5 and L3-4. Patient understands the rationale to perform TLIFs at two levels to decompress the right L4-5 and L5-S1 lateral recess and foramenal stenosis and to allow for some improved correction of her overall kyphoscoliosis. The patient was then transported to OR and was placed under general anestheticwithout difficulty. The patient received appropriate preoperative antibiotic prophylaxis 2 gm Ancef.  Nursing staff inserted a Foley catheter under sterile conditions. The patient was then turned to a prone position using the [place] spine frame. PAS. all pressure points well padded the arms at the side to 90 90. Standard prep with DuraPrep solution draped in the usual manner from the lower dorsal spine the mid sacral segment. Iodine Vi-Drape was used and the old incision scar was marked. Time-out procedure was called and correct. Skin in the midline between L2 and S1 was then infiltrated with local anesthesia, marcaine 1/2% 1:1 exparel 1.3% total 30 cc used. Incision was then made  extending from L3-S1  through the skin and subcutaneous layers down to the patient's lumbodorsal fascia and spinous processes. The incision then carried sharply excising the supraspinous ligament and then continuing the lateral aspect of the spinous processes of L2, L3, L4 and L5. Cobb elevator used to carefully elevate the paralumbar muscles off of the posterior elements using electrocautery carefully drilled bleeding and perform dissection of the muscle tissues of  the preserving the facet capsule at the L2-3. Continuing the exposure out laterally to expose the lateral margin of the facet joint line at L2-3, L3-4 and L4-5 . Incision was carried in the midline down to the S1 level area bleeders controlled using electrocautery monopolar electrocautery.  Cerebellar self retaining retractors were used for the upper and lower part of the  incision. C-arm fluoroscopy was then brought into the field and using C-arm fluoroscopy then a hole made into the medial aspect of the pedicle of right L5 observed in the pedicle using C arm at the 5 oclock position on the right L5 pedicle nerve probe initial entry was determined on fluoroscopy to be good position alignment so that a 4.66m tap was passed to 35 mm within the right L5 pedicle to a depth of nearly 35 mm observed on C-arm fluoroscopy to be beyond the midpoint of the lumbar vertebra and then position alignment within the right L5 pedicle this was then removed and the pedicle channel probed demonstrating patency no sign of rupture the cortex of the pedicle. Tapping with a 4 mm screw tap then 5 mm tap then 6.0 mm tap and then a 7.0 mm  Tap a 7.0 mm x 35 mm screw was placed on the right side at the L5 level. C-arm fluoroscopy was then brought into the field and using C-arm fluoroscopy then a hole made into the posterior medial aspect of the pedicle of left L5 observed in the pedicle using ball tipped nerve hook and hockey stick nerve probe initial entry was determined on fluoroscopy to be good position alignment so that 4.074mtap was then used to tap the left L5 pedicle to a depth of nearly 35 mm observed on C-arm fluoroscopy to be beyond the midpoint of the lumbar vertebra and then position alignment within the left L5 pedicle this was then removed and the pedicle channel probed demonstrating patency no sign of rupture the cortex of the pedicle. Tapping with a 5 mm screw tap then a 6.0 mm tap and then a 7.0 mm tap, a  7.0 mm x 35 mm screw was placed on the field for use after L4-5 decompression and TLIF. C-arm fluoroscopy was then brought into the field and using C-arm fluoroscopy then a hole made into the posterior and medial aspect of the right pedicle of L4 observed in the pedicle using ball tipped nerve hook and hockey stick nerve probe initial entry was determined on fluoroscopy to be good position  alignment so that a 4.23m39map was then used to tap the right L4 pedicle to a depth of nearly 35 mm observed on C-arm fluoroscopy to be beyond the posterior one third of the lumbar vertebra and good position alignment within the right L4 pedicle this was then removed and the pedicle channel probed demonstrating patency no sign of rupture the cortex of the pedicle. Tapping with a 5 mm screw tap then a 6.0 mm tap and then a 7.0 mm tap,  then 7.23mm24m35 mm screw was placed on the right side at the L4 level. The pedicle channel of L4 on the right probed demonstrating patency no sign of rupture the cortex of the pedicle. Viper screw for fixation of this level was measured as 7.0 mm x 35 mm screw so  was placed on the right side at the L4 level. C-arm fluoroscopy was then brought into the field and using C-arm fluoroscopy then a hole made into the posterior and medial aspect of  the left pedicle of L4 observed in the pedicle using ball tipped nerve hook and hockey stick nerve probe initial entry was determined on fluoroscopy to be good position alignment so that a 4.39m tap was then used to tap the left L4 pedicle to a depth of nearly 35 mm observed on C-arm fluoroscopy to be beyond the posterior one third of the lumbar vertebra and good position alignment within the left L4 pedicle this was then removed and the pedicle channel probed demonstrating patency no sign of rupture the cortex of the pedicle. Tapping with a 5 mm screw tap and then a 6.0 mm tap and then a 7.0 mm tap,  Then a 7.076mx 35 mm screw was placed on the left side at the L4 level. The pedicle channel of L4 on the left probed demonstrating patency no sign of rupture the cortex of the pedicle. Viper screw for fixation of this level was measured as 7.0 mm x 35 mm screw left on the field for insertion after decompression and TLIF. C-arm fluoroscopy was then brought into the field and using C-arm fluoroscopy then a hole made into the posterior and medial aspect  of the right pedicle of L3 observed in the pedicle using ball tipped nerve hook and hockey stick nerve probe initial entry was determined on fluoroscopy to be good position alignment so that a 4.74m72map was then used to tap the right L3 pedicle to a depth of nearly 35 mm observed on C-arm fluoroscopy to be beyond the posterior one third of the lumbar vertebra and good position alignment within the right L3 pedicle this was then removed and the pedicle channel probed demonstrating patency no sign of rupture the cortex of the pedicle. Tapping with a 5 mm screw tap then a 6.0 mm tap,  then 6.74mm107m35 mm screw was placed on the right side at the L3 level. The pedicle channel of L3 on the right probed demonstrating patency no sign of rupture the cortex of the pedicle. Viper screw for fixation of this level was measured as 6.0 mm x 35 mm screw so  was placed on the right side at the L3 level. C-arm fluoroscopy was then brought into the field and using C-arm fluoroscopy then a hole made into the posterior and medial aspect of the left pedicle of L3 observed in the pedicle using ball tipped nerve hook and hockey stick nerve probe initial entry was determined on fluoroscopy to be good position alignment so that a 4.74mm 42m was then used to tap the left L3 pedicle to a depth of nearly 35 mm observed on C-arm fluoroscopy to be beyond the posterior one third of the lumbar vertebra and good position alignment within the left L3 pedicle this was then removed and the pedicle channel probed demonstrating patency no sign of rupture the cortex of the pedicle. Tapping with a 5 mm screw tap and then a 6.0 mm tap, The pedicle channel of L4 on the left probed demonstrating patency no sign of rupture the cortex of the pedicle. Viper screw for fixation of this level was measured as 6.0 mm x 35 mm screw left on the field for insertion after L3-4 decompression and TLIF. Following tapping with a 4 mm, 5mm a24m6 mm taps a 6.0 x 35 mm screw was  placed on the table to be inserted following decompression and TLIF. Flow Seal was used for hemostasis of the left L3, L4 and L5 pedicle screw holes.  The  left hemi lamina at L3 and L4 segment the lower 50% was resected and Leksell rongeur used to resect inferior aspect of the lamina on the left side at the L3 and L4 level. The left medial 40% of the facet of L4-5 and L3-4 were resected in order to decompress the left side of the lumbar thecal sac at  L4-5 and L3-4 and decompress the left L3, L4 and L5 neuroforamen. Osteotomes and 41m and 397mkerrisons were used for this portion of the decompression. Nearly complete facetectomies were perform on the left at L4-5 and L3-4 to provide for exposure of the left side L4-5 and L3-4 neuroforamen for ease of placement of TLIFs (transforaminal lumbar interbody fusion) at each level inferior portions of the lamina and pars were also resected first beginning with the Leksell rongeur and osteotomes and then resecting using 2 and 3 mm Kerrison. Continued laminectomy was carried out resecting the central portions of the lamina of L4 and L5 performing foraminotomies on the left side at the L3, L4 and L5 levels. The inferior articular process L4 and L3 were resected on the left side. The left L5 nerve root identified and the medial aspect of the L5 pedicle. Superior articular process of L5 was then resected from the left side further decompressing the left L5 nerve and providing for exposure of the area just superior to the L5 pedicle for a placement of L4-5 cage. A large amount of hypertrophic ligmentum flavum was found impressing on the left lateral recesses at L4-5 and L3-4 and narrowing the respective L4 and L5 neuroforamen.Medial superior articular process of L4 was removed as a unit. Loupe magnification and headlight were used during this portion procedure.  Attention then turned to placement of the transforaminal lumbar interbody fusion cages. Using a Penfield 4 the right  lateral aspect of the thecal sac at the L4-5 disc space was carefully freed up The thecal sac could then easily be retracted in the posterior lateral aspect of the L4-5 disc was exposed 15 blade scalpel used to incise the posterolateral disc and an osteotome used to resect a small portion of bone off the superior aspect of the posterior superior vertebral body of L5 in order to ease the entry into the L4-5 disc space. A  40m55merrison rongeur was then able to be introduced in the disc space debrided it was quite narrow. 7 mm dilator was used to dialate the L4-5 disc space on the right side attempts were made to dilate further the 8 mm, 9mm43md then 10mm55me successful and using small curettes and the disc space was debrided a minimal degenerative disc present in the endplates debrided to bleeding endplate bone. Shavers were inserted to trial the intervertebral disc space. A 10 mm x 23mm 1040my lordotic concorde cage was carefully packed with morcellized bone graft and the been harvested from previous laminotomies.The cage was then inserted with the articulating insertion handle.  Additional bone graft was then packed into the intervertebral disc space. Bleeding controlled using bipolar electrocautery thrombin soaked gel cottonoids. Then turned to the left L3-4 level similarly the exposure the posterior lateral aspect this was carried out using a Penfield 4 bipolar electrocautery to control small bleeders present. Derricho retractor used to retract the thecal sac and L3 nerve root a 15 blade scalpel was used to incise posterior lateral aspect of the left L3-4 disc the disc space at this level showed a rather severe narrowing posteriorly was more open anteriorly so that an osteotome  again was used to resect a small portion the posterior superior lip of the vertebral body at L4  in order to gain ease of access into the L3-4 disc space. The space was debrided of degenerative disc material using pituitary along root the  entire disc space was then debrided of degenerative disc material using pituitary rongeurs curettage down to bleeding bone endplates. 8 mm and 9 mm shavers were used to debride the disc space and pituitary ronguers used to remove the loosened debris. This space was then carefully assess using spacers  a 8.66m, 9 mm and then a10 mm trial cage provided the best fit, the Depuy concorde bullet lordotic cage 137mx 2379mas chosen so that the permanent 10.0 mm cage by 23 mm Lordotic concorde cage packed with local bone graft was placed into the intervertebral disc space. The posterior intervertebral disc space was then packed with autogenous local bone graft that been harvested from the central laminectomy. Bleeding controlled using bipolar electrocautery.  Observed on C-arm fluoroscopy to be in good position alignment. The cages at L4-5 and L3-4 were placed anteriorly as best as possible to maintain lumbar lordosis that was present. With this then the transforaminal lumbar interbody fusion portion of the case was completed bleeders were controlled using bipolar electrocautery thrombin-soaked Gelfoam were appropriate.Decortication of the facet joints carried out bilateral L4-5 and L3-4. These were packed with cancellous local bone graft. 3 pedicle screws on the left and the right L3 pedicle screw already in place, the 2 viper corticofixation screws on the left were each placed and then each fastener carefully aligned  to allow for placement of rods. The right side first quarter inch titanium rod was then carefully contoured using the french benders and a precontoured 1m1md. This was then placed into the pedicle screws on the right extending from L3-L5 each of the caps were carefully placed loosely tightened. Attention turned to the left side were similarly and then screws were carefully adjusted to allow for a better pattern screws to allow for placement of fixation of the rod a quarter inch 65 mm precontoured  titanium rod was then carefully contoured. This was able to be inserted into the left pedicle screw fasteners, Caps onto the L3 fasteners were tightened to 80 foot lbs. Across the left side  L3-4 and L4-5 screw fasteners compression was obtained on the left side between L3 and L4, then L4 and L5 by compressing between the fasteners and tightening the screw caps 85 pounds. Similarly this was done on the right side at L3-4 and L4-5 obtaining compression and tightened 85 pounds. Irrigation was carried out with copious amounts of saline solution this was done throughout the case. Cell Saver was used during the case. Hockey stick neuroprobe was used to probe the neuroforamen bilateral L3, L4 and L5, these were determined to be well decompressed. Permanent C-arm images were obtained in AP and lateral plane and oblique planes. Remaining local bone graft was then applied along both lateral posterior lateral region extending from L3 to L5 facet beds.Gelfoam was then removed spinal canal. The lumbodorsal musculature carefully exam debrided of any devitalized tissue following removal of cerebellar retractors were the bleeders were controlled using electrocautery and the area dorsal lumbar muscle were then approximated in the midline with interrupted #1 Vicryl sutures loose the dorsal fascia was reattached to the spinous process of L2 to superiorly and L5  inferiorly this was done with #1 Vicryl sutures. Subcutaneous layers then approximated using interrupted 0  Vicryl sutures and 2-0 Vicryl sutures. Skin was closed with a running subcutaneous stitch of 4-0 Vicryl Dermabond was applied then MedPlex bandage. All instrument and sponge counts were correct. The patient was then returned to a supine position on her bed reactivated extubated and returned to the recovery room in satisfactory condition.       Benjiman Core, PA-C perform the duties of assistant surgeon during this case. He was present from the beginning of the case to  the end of the case assisting in transfer the patient from her stretcher to the OR table and back to the stretcher at the end of the case. Assisted in careful retraction and suction of the laminectomy site delicate neural structures operating under the operating room microscope. He performed closure of the incision from the fascia to the skin applying the dressing.     Karly Pitter E  08/30/2016, 11:38 AM

## 2016-08-31 LAB — CBC
HCT: 29.3 % — ABNORMAL LOW (ref 36.0–46.0)
HEMOGLOBIN: 9.2 g/dL — AB (ref 12.0–15.0)
MCH: 27.3 pg (ref 26.0–34.0)
MCHC: 31.4 g/dL (ref 30.0–36.0)
MCV: 86.9 fL (ref 78.0–100.0)
PLATELETS: 207 10*3/uL (ref 150–400)
RBC: 3.37 MIL/uL — ABNORMAL LOW (ref 3.87–5.11)
RDW: 16.3 % — ABNORMAL HIGH (ref 11.5–15.5)
WBC: 8.6 10*3/uL (ref 4.0–10.5)

## 2016-08-31 LAB — BASIC METABOLIC PANEL
Anion gap: 8 (ref 5–15)
BUN: 5 mg/dL — AB (ref 6–20)
CALCIUM: 8.3 mg/dL — AB (ref 8.9–10.3)
CHLORIDE: 104 mmol/L (ref 101–111)
CO2: 24 mmol/L (ref 22–32)
CREATININE: 0.71 mg/dL (ref 0.44–1.00)
Glucose, Bld: 125 mg/dL — ABNORMAL HIGH (ref 65–99)
Potassium: 3.5 mmol/L (ref 3.5–5.1)
SODIUM: 136 mmol/L (ref 135–145)

## 2016-08-31 MED ORDER — SODIUM CHLORIDE 0.9 % IV BOLUS (SEPSIS)
250.0000 mL | Freq: Once | INTRAVENOUS | Status: AC
  Administered 2016-08-31: 250 mL via INTRAVENOUS

## 2016-08-31 NOTE — Evaluation (Signed)
Occupational Therapy Evaluation Patient Details Name: Carol Warren MRN: [id number] DOB: 11-07-1946 Today's Date: 08/31/2016    History of Present Illness Lumbar Decompression and Fusion L3-4 and L4-5 Transforaminal Lumbar Interbody Fusion  Significant PMHx- eye surgery 2 wks PTA, COPD, plantar fascitis, PPM   Clinical Impression   PTA, pt was independent with basic ADLs and mobility. Pt currently requires min guard assist for basic transfers and LB ADLs due to acute back pain and balance deficits. Educated pt on back precautions, brace wear protocol, compensatory strategies for LB ADLs, and fall prevention strategies. Pt plans to d/c home with intermittent assistance from her husband and friends. Pt will benefit from continued acute OT to increase independence and safety with ADLs and mobility to allow for safe discharge home. Recommend 3in1 for home use.    Follow Up Recommendations  No OT follow up;Supervision - Intermittent    Equipment Recommendations  3 in 1 bedside comode    Recommendations for Other Services       Precautions / Restrictions Precautions Precautions: Back;Fall Precaution Booklet Issued: Yes (comment) Precaution Comments: Pt able to recall 1/3 back precautions at beginning of session. Reviewed handout and precautions during functional activities Required Braces or Orthoses: Spinal Brace Spinal Brace: Lumbar corset;Applied in sitting position Restrictions Weight Bearing Restrictions: No      Mobility Bed Mobility Overal bed mobility: Needs Assistance Bed Mobility: Rolling;Sidelying to Sit Rolling: Supervision Sidelying to sit: Supervision       General bed mobility comments: HOB flat, use of bedrails, exited on L side of bed to simulate home environment. No physical assist required and good demonstration of log roll technique  Transfers Overall transfer level: Needs assistance Equipment used: Rolling walker (2 wheeled) Transfers: Sit to/from  Stand Sit to Stand: Min guard         General transfer comment: Min guard assist for safety. VCs for safe hand placement and to adhere to back precautions at all times.    Balance Overall balance assessment: Needs assistance Sitting-balance support: No upper extremity supported;Feet supported Sitting balance-Leahy Scale: Good     Standing balance support: Bilateral upper extremity supported;During functional activity Standing balance-Leahy Scale: Poor                              ADL Overall ADL's : Needs assistance/impaired Eating/Feeding: Independent;Sitting   Grooming: Wash/dry hands;Wash/dry face;Min guard;Standing   Upper Body Bathing: Supervision/ safety;Sitting   Lower Body Bathing: Supervison/ safety;Sit to/from stand Lower Body Bathing Details (indicate cue type and reason): educated on use of long-handled sponge Upper Body Dressing : Supervision/safety;Sitting   Lower Body Dressing: Min guard;Sit to/from stand Lower Body Dressing Details (indicate cue type and reason): able to cross ankle-over-knee Toilet Transfer: Min guard;Ambulation;BSC;RW   Toileting- Clothing Manipulation and Hygiene: Supervision/safety;Sit to/from stand Toileting - Clothing Manipulation Details (indicate cue type and reason): educated on use of wet wipes     Functional mobility during ADLs: Min guard;Rolling walker       Vision Vision Assessment?: No apparent visual deficits   Perception     Praxis      Pertinent Vitals/Pain Pain Assessment: 0-10 Pain Score: 4  Pain Location: back Pain Descriptors / Indicators: Aching;Sore Pain Intervention(s): Monitored during session;Limited activity within patient's tolerance;Repositioned;Premedicated before session     Hand Dominance Right   Extremity/Trunk Assessment Upper Extremity Assessment Upper Extremity Assessment: Generalized weakness   Lower Extremity Assessment Lower Extremity Assessment: Generalized weakness  Cervical / Trunk Assessment Cervical / Trunk Assessment: Other exceptions Cervical / Trunk Exceptions: s/p lumbar surgery   Communication Communication Communication: No difficulties   Cognition Arousal/Alertness: Awake/alert Behavior During Therapy: WFL for tasks assessed/performed Overall Cognitive Status: Within Functional Limits for tasks assessed       Memory: Decreased recall of precautions             General Comments       Exercises       Shoulder Instructions      Home Living Family/patient expects to be discharged to:: Private residence Living Arrangements: Spouse/significant other Available Help at Discharge: Family;Available PRN/intermittently Type of Home: House Home Access: Stairs to enter [employer] of Steps: 3 Entrance Stairs-Rails: None Home Layout: One level     Bathroom Shower/Tub: Tub/shower unit Shower/tub characteristics: Curtain Biochemist, clinical: Standard     Home Equipment: Environmental consultant - 2 wheels;Cane - single point;Walker - standard;Grab bars - tub/shower;Grab bars - toilet;Adaptive equipment Adaptive Equipment: Reacher;Sock aid;Long-handled sponge;Long-handled shoe horn        Prior Functioning/Environment Level of Independence: Needs assistance  Gait / Transfers Assistance Needed: No AD needed ADL's / Homemaking Assistance Needed: assist with IADLs, drives x1 per day   Comments: Has 2 dogs    OT Diagnosis: Generalized weakness;Acute pain   OT Problem List: Decreased strength;Decreased range of motion;Decreased activity tolerance;Impaired balance (sitting and/or standing);Decreased safety awareness;Decreased knowledge of precautions;Decreased knowledge of use of DME or AE;Pain   OT Treatment/Interventions: Self-care/ADL training;Therapeutic exercise;DME and/or AE instruction;Therapeutic activities;Balance training;Patient/family education    OT Goals(Current goals can be found in the care plan section) Acute Rehab OT  Goals Patient Stated Goal: return to walking without a device OT Goal Formulation: With patient Time For Goal Achievement: 09/14/16 Potential to Achieve Goals: Good ADL Goals Pt Will Transfer to Toilet: with modified independence;ambulating;regular height toilet Pt Will Perform Toileting - Clothing Manipulation and hygiene: with modified independence;sitting/lateral leans;sit to/from stand Pt Will Perform Tub/Shower Transfer: Tub transfer;with supervision;ambulating;3 in 1;rolling walker Additional ADL Goal #1: Pt will don/doff back brace independently to increase independence with ADLs. Additional ADL Goal #2: Pt will demonstrate adherence to 3/3 back precautions with min verbal cues.  OT Frequency: Min 2X/week   Barriers to D/C:            Co-evaluation              End of Session Equipment Utilized During Treatment: Gait belt;Rolling walker;Back brace Nurse Communication: Mobility status  Activity Tolerance: Patient tolerated treatment well Patient left: in chair;with call bell/phone within reach;with chair alarm set;with family/visitor present   Time: 1414-1450 OT Time Calculation (min): 36 min Charges:  OT General Charges $OT Visit: 1 Procedure OT Evaluation $OT Eval Moderate Complexity: 1 Procedure OT Treatments $Self Care/Home Management : 8-22 mins G-Codes:    Redmond Baseman, OTR/L Pager: [phone] 08/31/2016, 4:11 PM

## 2016-08-31 NOTE — Progress Notes (Signed)
Subjective: 1 Day Post-Op Procedure(s) (LRB): Lumbar Decompression and Fusion L3-4 and L4-5 Transforaminal Lumbar Interbody Fusion with cages, pedicle screws and rods L3 to L5 (N/A) Patient reports pain as moderate.  Tolerated her surgery well.  Physical Therapy at the bedside now working on her mobility.  H/H with slight drop (acute blood loss anemia from surgery - will monitor)  Objective: Vital signs in last 24 hours: Temp:  [97.5 F (36.4 C)-98.3 F (36.8 C)] 98.3 F (36.8 C) (09/02 0446) Pulse Rate:  [73-87] 87 (09/02 0446) Resp:  [12-18] 18 (09/02 0446) BP: (98-170)/(48-91) 99/51 (09/02 0446) SpO2:  [98 %-100 %] 98 % (09/02 0446) Arterial Line BP: (122-146)/(56-80) 124/80 (09/01 1309)  Intake/Output from previous day: 09/01 0701 - 09/02 0700 In: 2620 [P.O.:720; I.V.:1900] Out: 2285 [Urine:2185; Blood:100] Intake/Output this shift: No intake/output data recorded.   Recent Labs  08/31/16 0426  HGB 9.2*    Recent Labs  08/31/16 0426  WBC 8.6  RBC 3.37*  HCT 29.3*  PLT 207    Recent Labs  08/31/16 0426  NA 136  K 3.5  CL 104  CO2 24  BUN 5*  CREATININE 0.71  GLUCOSE 125*  CALCIUM 8.3*   No results for input(s): LABPT, INR in the last 72 hours.   Assessment/Plan: 1 Day Post-Op Procedure(s) (LRB): Lumbar Decompression and Fusion L3-4 and L4-5 Transforaminal Lumbar Interbody Fusion with cages, pedicle screws and rods L3 to L5 (N/A) Up with therapy  Mcarthur Rossetti 08/31/2016, 9:09 AM

## 2016-08-31 NOTE — Evaluation (Signed)
Physical Therapy Evaluation Patient Details Name: Carol Warren MRN: [id number] DOB: 1946-03-30 Today's Date: 08/31/2016   History of Present Illness  Lumbar Decompression and Fusion L3-4 and L4-5 Transforaminal Lumbar Interbody Fusion  Significant PMHx- eye surgery 2 wks PTA, COPD, plantar fascitis, PPM  Clinical Impression  Patient is s/p above surgery resulting in the deficits listed below (see PT Problem List). Patient with posterior bias in sitting and static standing. Anticipate she will need spouse to provide at least minguard assist when up walking with RW. Patient will benefit from skilled PT to increase their independence and safety with mobility (while adhering to their precautions) to allow discharge to the venue listed below.     Follow Up Recommendations Home health PT;Supervision for mobility/OOB    Equipment Recommendations  None recommended by PT    Recommendations for Other Services OT consult     Precautions / Restrictions Precautions Precautions: Back;Fall Precaution Booklet Issued: Yes (comment) Required Braces or Orthoses: Spinal Brace Spinal Brace: Lumbar corset;Applied in sitting position (pt educated & difficult for her to see straps in sitting) Restrictions Weight Bearing Restrictions: No      Mobility  Bed Mobility Overal bed mobility: Needs Assistance Bed Mobility: Rolling;Sidelying to Sit Rolling: Supervision Sidelying to sit: Min assist       General bed mobility comments: with rail, HOB 0; vc for technique; min assist to power up to sitting  Transfers Overall transfer level: Needs assistance Equipment used: Rolling walker (2 wheeled) Transfers: Sit to/from Stand Sit to Stand: Mod assist;+2 safety/equipment         General transfer comment: assist to power up and for balance as achieving standing (posterior lean)  Ambulation/Gait Ambulation/Gait assistance: Min assist Ambulation Distance (Feet): 25 Feet Assistive device: Rolling walker  (2 wheeled) Gait Pattern/deviations: Step-through pattern;Decreased stride length   Gait velocity interpretation: Below normal speed for age/gender General Gait Details: vc for proximity to RW and proper use; stood at sink >5 minutes for patient to clean and put in her dentures, therefore fatigued and did not ambulate farther  [employer]            Wheelchair Mobility    Modified Rankin (Stroke Patients Only)       Balance Overall balance assessment: Needs assistance Sitting-balance support: Bilateral upper extremity supported;Feet supported Sitting balance-Leahy Scale: Poor Sitting balance - Comments: UE support more for pain, however also unsteady at EOB Postural control: Posterior lean Standing balance support: No upper extremity supported;During functional activity Standing balance-Leahy Scale: Poor Standing balance comment: at sink with bimanual tasks she loses balance posteriorly (with delayed stepping strategy)                             Pertinent Vitals/Pain Pain Assessment: Faces (pt stated she could not describe with a number) Faces Pain Scale: Hurts little more Pain Location: back Pain Descriptors / Indicators: Operative site guarding Pain Intervention(s): Limited activity within patient's tolerance;Monitored during session;Premedicated before session;Repositioned    Home Living Family/patient expects to be discharged to:: Private residence Living Arrangements: Spouse/significant other Available Help at Discharge: Family;Available PRN/intermittently (husband is in/out "has his cellphone") Type of Home: House Home Access: Stairs to enter Entrance Stairs-Rails: None Entrance Stairs-Number of Steps: 3 Home Layout: One level Home Equipment: Walker - 2 wheels;Cane - single point;Walker - standard;Grab bars - tub/shower;Grab bars - toilet      Prior Function Level of Independence: Needs assistance   Gait / Transfers  Assistance Needed: walks no  device  ADL's / Homemaking Assistance Needed: assist with house work        Hand Dominance        Extremity/Trunk Assessment   Upper Extremity Assessment: Defer to OT evaluation           Lower Extremity Assessment: Generalized weakness      Cervical / Trunk Assessment: Normal  Communication   Communication: No difficulties  Cognition Arousal/Alertness: Awake/alert Behavior During Therapy: WFL for tasks assessed/performed Overall Cognitive Status: Within Functional Limits for tasks assessed                      General Comments      Exercises        Assessment/Plan    PT Assessment Patient needs continued PT services  PT Diagnosis Difficulty walking;Acute pain   PT Problem List Decreased strength;Decreased activity tolerance;Decreased balance;Decreased mobility;Decreased knowledge of use of DME;Decreased knowledge of precautions;Pain  PT Treatment Interventions DME instruction;Gait training;Stair training;Functional mobility training;Therapeutic activities;Patient/family education   PT Goals (Current goals can be found in the Care Plan section) Acute Rehab PT Goals Patient Stated Goal: return to walking without a device PT Goal Formulation: With patient Time For Goal Achievement: 09/07/16 Potential to Achieve Goals: Good    Frequency Min 5X/week   Barriers to discharge        Co-evaluation               End of Session Equipment Utilized During Treatment: Gait belt;Back brace Activity Tolerance: Patient tolerated treatment well Patient left: in chair;with call bell/phone within reach;with chair alarm set Nurse Communication: Mobility status         Time: 1747-1595 PT Time Calculation (min) (ACUTE ONLY): 32 min   Charges:   PT Evaluation $PT Eval Low Complexity: 1 Procedure PT Treatments $Gait Training: 8-22 mins   PT G Codes:        Carol Warren 09/06/16, 11:10 AM Pager [phone]

## 2016-09-01 MED ORDER — NICOTINE 21 MG/24HR TD PT24
21.0000 mg | MEDICATED_PATCH | Freq: Every day | TRANSDERMAL | Status: DC
  Administered 2016-09-02: 21 mg via TRANSDERMAL
  Filled 2016-09-01: qty 1

## 2016-09-01 NOTE — Progress Notes (Signed)
Occupational Therapy Treatment/Discharge Patient Details Name: Carol Warren MRN: [id number] DOB: 06/30/1946 Today's Date: 09/01/2016    History of present illness Lumbar Decompression and Fusion L3-4 and L4-5 Transforaminal Lumbar Interbody Fusion  Significant PMHx- eye surgery 2 wks PTA, COPD, plantar fascitis, PPM   OT comments  Pt making good progress and has achieved all acute OT goals. Pt continues to require min assist to don back brace, but pt's sister and niece said they will be able to assist along with pt's husband. Pt completed all basic ADLs with supervision except for LB ADLs which she will have assistance for from her family. Educated pt gradual activity progression, frequent position changes, home safety, and fall prevention strategies. All education has been completed and pt has no further questions. Pt with no further acute OT needs. OT signing off.   Follow Up Recommendations  No OT follow up;Supervision - Intermittent    Equipment Recommendations  3 in 1 bedside comode    Recommendations for Other Services      Precautions / Restrictions Precautions Precautions: Back;Fall Precaution Booklet Issued: Yes (comment) Precaution Comments: pt able to recall 2/3 of precautions but does not adhere to them Required Braces or Orthoses: Spinal Brace Spinal Brace: Lumbar corset;Applied in sitting position Restrictions Weight Bearing Restrictions: No       Mobility Bed Mobility Overal bed mobility: Needs Assistance Bed Mobility: Sit to Sidelying;Rolling Rolling: Supervision       Sit to sidelying: Supervision General bed mobility comments: HOB flat, no use of bedrails, exited on L side to simulate home envinronment. No VCs for log roll technique required, but pt reported increased pain with this technique  Transfers Overall transfer level: Needs assistance Equipment used: Rolling walker (2 wheeled) Transfers: Sit to/from Stand Sit to Stand: Supervision          General transfer comment: Supervision for safety. VCs for safe hand placement.     Balance Overall balance assessment: Needs assistance Sitting-balance support: No upper extremity supported;Feet supported Sitting balance-Leahy Scale: Good     Standing balance support: No upper extremity supported;During functional activity Standing balance-Leahy Scale: Fair                     ADL Overall ADL's : Needs assistance/impaired     Grooming: Wash/dry hands;Wash/dry face;Supervision/safety;Standing   Upper Body Bathing: Supervision/ safety;Sitting       Upper Body Dressing : Minimal assistance;Sitting Upper Body Dressing Details (indicate cue type and reason): to don back brace; pt's sister able to assist     Toilet Transfer: Supervision/safety;Ambulation;BSC;RW   Toileting- Clothing Manipulation and Hygiene: Supervision/safety;Sit to/from stand   Tub/ Shower Transfer: Tub transfer;Min guard;Cueing for safety;Ambulation;Rolling walker Tub/Shower Transfer Details (indicate cue type and reason): determined that pt has sliding glass doors and cannot use 3in1 as shower seat; sister will purchase a shower seat today Functional mobility during ADLs: Supervision/safety;Rolling walker General ADL Comments: Reviewed fall prevention and home safety strategies. Pt's sister and niece will assist with showers.      Vision                     Perception     Praxis      Cognition   Behavior During Therapy: [facility] for tasks assessed/performed Overall Cognitive Status: Within Functional Limits for tasks assessed                       Extremity/Trunk Assessment  Exercises     Shoulder Instructions       General Comments      Pertinent Vitals/ Pain       Pain Assessment: 0-10 Pain Score: 6  Pain Location: back Pain Descriptors / Indicators: Sore Pain Intervention(s): Monitored during session;Repositioned  Home Living                                           Prior Functioning/Environment              Frequency       Progress Toward Goals  OT Goals(current goals can now be found in the care plan section)  Progress towards OT goals: Progressing toward goals;Goals met/education completed, patient discharged from OT  Acute Rehab OT Goals Patient Stated Goal: home  OT Goal Formulation: With patient Time For Goal Achievement: 09/14/16 Potential to Achieve Goals: Good ADL Goals Pt Will Transfer to Toilet: with modified independence;ambulating;regular height toilet Pt Will Perform Toileting - Clothing Manipulation and hygiene: with modified independence;sitting/lateral leans;sit to/from stand Pt Will Perform Tub/Shower Transfer: Tub transfer;with supervision;ambulating;3 in 1;rolling walker Additional ADL Goal #1: Pt will don/doff back brace independently to increase independence with ADLs. Additional ADL Goal #2: Pt will demonstrate adherence to 3/3 back precautions with min verbal cues.  Plan All goals met and education completed, patient discharged from OT services    Co-evaluation                 End of Session Equipment Utilized During Treatment: Gait belt;Rolling walker;Back brace   Activity Tolerance Patient tolerated treatment well   Patient Left in bed;with call bell/phone within reach;with bed alarm set   Nurse Communication Mobility status        Time: 1950-9326 OT Time Calculation (min): 29 min  Charges: OT General Charges $OT Visit: 1 Procedure OT Treatments $Self Care/Home Management : 23-37 mins  Redmond Baseman, OTR/L Pager: [phone] 09/01/2016, 2:08 PM

## 2016-09-01 NOTE — Progress Notes (Signed)
Subjective: 2 Days Post-Op Procedure(s) (LRB): Lumbar Decompression and Fusion L3-4 and L4-5 Transforaminal Lumbar Interbody Fusion with cages, pedicle screws and rods L3 to L5 (N/A) Patient reports pain as moderate.  No acute changes.  Working well with therapy.  Objective: Vital signs in last 24 hours: Temp:  [98.1 F (36.7 C)-99.5 F (37.5 C)] 98.7 F (37.1 C) (09/03 0618) Pulse Rate:  [86-91] 88 (09/03 0618) Resp:  [16-20] 20 (09/03 0618) BP: (89-135)/(51-61) 123/58 (09/03 0618) SpO2:  [92 %-100 %] 100 % (09/03 0618)  Intake/Output from previous day: 09/02 0701 - 09/03 0700 In: 2250 [I.V.:2250] Out: 600 [Urine:600] Intake/Output this shift: Total I/O In: -  Out: 100 [Urine:100]   Recent Labs  08/31/16 0426  HGB 9.2*    Recent Labs  08/31/16 0426  WBC 8.6  RBC 3.37*  HCT 29.3*  PLT 207    Recent Labs  08/31/16 0426  NA 136  K 3.5  CL 104  CO2 24  BUN 5*  CREATININE 0.71  GLUCOSE 125*  CALCIUM 8.3*   No results for input(s): LABPT, INR in the last 72 hours.  Sensation intact distally Intact pulses distally Dorsiflexion/Plantar flexion intact Incision: dressing C/D/I  Assessment/Plan: 2 Days Post-Op Procedure(s) (LRB): Lumbar Decompression and Fusion L3-4 and L4-5 Transforaminal Lumbar Interbody Fusion with cages, pedicle screws and rods L3 to L5 (N/A) Up with therapy Plan for discharge tomorrow Discharge home with home health  Mcarthur Rossetti 09/01/2016, 9:24 AM

## 2016-09-01 NOTE — Progress Notes (Signed)
Physical Therapy Treatment Patient Details Name: Carol Warren MRN: [id number] DOB: Aug 02, 1946 Today's Date: 09/01/2016    History of Present Illness Lumbar Decompression and Fusion L3-4 and L4-5 Transforaminal Lumbar Interbody Fusion  Significant PMHx- eye surgery 2 wks PTA, COPD, plantar fascitis, PPM    PT Comments    Pt progressing well with mobility however pt noncompliant with precautions with decreased safety awareness despite max verbal cues. Pt strongly dislikes brace and PT encouraged her to talk to Dr. Louanne Skye. Acute PT to con't to follow.  Follow Up Recommendations  Home health PT;Supervision for mobility/OOB     Equipment Recommendations  None recommended by PT    Recommendations for Other Services OT consult     Precautions / Restrictions Precautions Precautions: Back;Fall Precaution Booklet Issued: Yes (comment) Precaution Comments: pt able to recall 2/3 of precautions but does not adhere to them Required Braces or Orthoses: Spinal Brace Spinal Brace: Lumbar corset;Applied in sitting position Restrictions Weight Bearing Restrictions: No    Mobility  Bed Mobility Overal bed mobility: Needs Assistance Bed Mobility: Rolling;Sidelying to Sit Rolling: Supervision Sidelying to sit: Supervision       General bed mobility comments: HOB flat, pt attempted long sit requiring max v/c's to complete Log roll to adhere to precautions, pt agreed that logrolling was less painful than long sit  Transfers Overall transfer level: Needs assistance Equipment used: Rolling walker (2 wheeled) Transfers: Sit to/from Stand Sit to Stand: Min guard         General transfer comment: v/c's for safe hand placement, pt mildly impulsive  Ambulation/Gait Ambulation/Gait assistance: Supervision Ambulation Distance (Feet): 150 Feet Assistive device: Rolling walker (2 wheeled) Gait Pattern/deviations: Step-through pattern Gait velocity: slow Gait velocity interpretation: Below  normal speed for age/gender General Gait Details: v/c's to minimize twisting during turns,    Stairs Stairs: Yes Stairs assistance: Min assist Stair Management: No rails (HHA, reached for rail during descent) Number of Stairs: 4 General stair comments: pt doesn't have railings at home tried to mimic  Wheelchair Mobility    Modified Rankin (Stroke Patients Only)       Balance Overall balance assessment: Needs assistance         Standing balance support: During functional activity;No upper extremity supported Standing balance-Leahy Scale: Fair Standing balance comment: pt washed her hands at sink without difficulty, also performed hygiene s/p urination                    Cognition Arousal/Alertness: Awake/alert Behavior During Therapy: Impulsive Overall Cognitive Status: Impaired/Different from baseline Area of Impairment: Safety/judgement;Memory     Memory: Decreased short-term memory;Decreased recall of precautions   Safety/Judgement: Decreased awareness of safety;Decreased awareness of deficits          Exercises      General Comments General comments (skin integrity, edema, etc.): pt assisted to bathroom, despite max v/c's pt contiued to pull self up using RW instead of pushing up from toliet      Pertinent Vitals/Pain Pain Assessment: 0-10 Pain Score: 4  Pain Location: back Pain Descriptors / Indicators: Sore Pain Intervention(s): Monitored during session    Home Living                      Prior Function            PT Goals (current goals can now be found in the care plan section) Acute Rehab PT Goals Patient Stated Goal: home  Progress towards PT goals:  Progressing toward goals    Frequency  Min 5X/week    PT Plan Current plan remains appropriate    Co-evaluation             End of Session Equipment Utilized During Treatment: Gait belt;Back brace Activity Tolerance: Patient tolerated treatment well Patient left:  in chair;with call bell/phone within reach     Time: 0927-1000 PT Time Calculation (min) (ACUTE ONLY): 33 min  Charges:  $Gait Training: 8-22 mins $Therapeutic Activity: 8-22 mins                    G Codes:      Carol Warren 09/01/2016, 10:13 AM   Carol Warren, PT, DPT Pager #: [phone] Office #: [phone]

## 2016-09-02 MED ORDER — OXYCODONE-ACETAMINOPHEN 5-325 MG PO TABS: 1.0000 | ORAL_TABLET | ORAL | 0 refills | Status: DC | PRN

## 2016-09-02 MED ORDER — METHOCARBAMOL 500 MG PO TABS: 500.0000 mg | ORAL_TABLET | Freq: Four times a day (QID) | ORAL | 1 refills | Status: DC | PRN

## 2016-09-02 NOTE — Care Management Important Message (Signed)
Important Message  Patient Details  Name: Carol Warren MRN: [id number] Date of Birth: 02/24/1946   Medicare Important Message Given:  Yes    Rosslyn Pasion 09/02/2016, 9:22 AM

## 2016-09-02 NOTE — Progress Notes (Signed)
Physical Therapy Treatment Patient Details Name: Carol Warren MRN: [id number] DOB: 30-Apr-1946 Today's Date: 09/02/2016    History of Present Illness Lumbar Decompression and Fusion L3-4 and L4-5 Transforaminal Lumbar Interbody Fusion  Significant PMHx- eye surgery 2 wks PTA, COPD, plantar fascitis, PPM    PT Comments    Patient is progressing well toward mobility goals. Reviewed precautions and stair management. Current plan remains appropriate.   Follow Up Recommendations  Home health PT;Supervision for mobility/OOB     Equipment Recommendations  None recommended by PT    Recommendations for Other Services OT consult     Precautions / Restrictions Precautions Precautions: Back;Fall Precaution Booklet Issued: Yes (comment) Precaution Comments: pt able to recall 2/3 of precautions; reviewed 3/3 precautions with pt Required Braces or Orthoses: Spinal Brace Spinal Brace: Lumbar corset;Applied in sitting position Restrictions Weight Bearing Restrictions: No    Mobility  Bed Mobility               General bed mobility comments: ambulating in room without brace upon arrival; reiterated importance of wearing brace when up out of bed   Transfers Overall transfer level: Needs assistance Equipment used: Rolling walker (2 wheeled) Transfers: Sit to/from Stand Sit to Stand: Supervision         General transfer comment: cues for hand placement and safe use of AD  Ambulation/Gait Ambulation/Gait assistance: Supervision Ambulation Distance (Feet): 150 Feet Assistive device: Rolling walker (2 wheeled) Gait Pattern/deviations: Step-through pattern;Decreased stride length Gait velocity: decreased   General Gait Details: cues to maintain back precautions and for increased stride length   Stairs Stairs: Yes Stairs assistance: Min assist Stair Management: No rails;Forwards Number of Stairs: 4 General stair comments: supervision to ascend and min A to descend with HHA;  cues for step to pattern  Wheelchair Mobility    Modified Rankin (Stroke Patients Only)       Balance Overall balance assessment: Needs assistance   Sitting balance-Leahy Scale: Good       Standing balance-Leahy Scale: Fair                      Cognition Arousal/Alertness: Awake/alert Behavior During Therapy: Impulsive (at times--likely baseline) Overall Cognitive Status: Within Functional Limits for tasks assessed                      Exercises      General Comments General comments (skin integrity, edema, etc.): pt is compliant with precautions ~50% time      Pertinent Vitals/Pain Pain Assessment: 0-10 Pain Score: 4  Pain Location: back Pain Descriptors / Indicators: Sore Pain Intervention(s): Limited activity within patient's tolerance;Monitored during session;Premedicated before session;Repositioned    Home Living                      Prior Function            PT Goals (current goals can now be found in the care plan section) Acute Rehab PT Goals Patient Stated Goal: home  Progress towards PT goals: Progressing toward goals    Frequency  Min 5X/week    PT Plan Current plan remains appropriate    Co-evaluation             End of Session Equipment Utilized During Treatment: Gait belt;Back brace Activity Tolerance: Patient tolerated treatment well Patient left: in chair;with call bell/phone within reach     Time: 1154-1230 PT Time Calculation (min) (ACUTE ONLY): 36 min  Charges:  $Gait Training: 8-22 mins $Therapeutic Activity: 8-22 mins                    G Codes:      Sal[place] April, PTA Pager: [phone]   09/02/2016, 2:21 PM

## 2016-09-02 NOTE — Progress Notes (Signed)
Patient is discharged from room 5C02 at this time. Alert and in stable condition. IV site d/c'd as well as tele. Instructions read to patient with understanding verbalized. Left unit via wheelchair with all belongings at side.

## 2016-09-02 NOTE — Discharge Summary (Signed)
Patient ID: Carol Warren MRN: [id number] DOB/AGE: 1946/01/25 70 y.o.  Admit date: 08/30/2016 Discharge date: 09/02/2016  Admission Diagnoses:  Principal Problem:   Spondylolisthesis of lumbar region Active Problems:   DDD (degenerative disc disease), lumbar   Spinal stenosis, lumbar region, with neurogenic claudication   Lumbar degenerative disc disease   Discharge Diagnoses:  Same  Past Medical History:  Diagnosis Date  . Adrenal tumor   . Anxiety   . Aortic aneurysm ([place])   . Asthma   . Bicuspid aortic valve    CONGENITAL  . Cancer ([place])    skin   . Carpal tunnel syndrome, bilateral   . Chronic depression   . Chronic fatigue   . Chronic sinusitis   . 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 COPD (chronic obstructive pulmonary disease) ([place])   . Degenerative disc disease    CERVICAL AND LUMBAR (DR. Lorin Mercy)  . GERD (gastroesophageal reflux disease)   . Hammer toe of right foot   . Headache   . Heart murmur   . Hepatomegaly   . HSV-1 (herpes simplex virus 1) infection   . HSV-2 (herpes simplex virus 2) infection   . Insomnia   . Lumbar stenosis   . Osteoarthritis   . Osteopenia   . Pernicious anemia   . Plantar fasciitis   . Pneumonia   . PONV (postoperative nausea and vomiting)   . Shortness of breath dyspnea   . Smoker   . Venous insufficiency of left leg   . Vitamin B 12 deficiency     Surgeries: Procedure(s): Lumbar Decompression and Fusion L3-4 and L4-5 Transforaminal Lumbar Interbody Fusion with cages, pedicle screws and rods L3 to L5 on 08/30/2016   Consultants: None  Discharged Condition: Improved  Hospital Course: Carol Warren is an 70 y.o. female who was admitted 08/30/2016 for operative treatment ofSpondylolisthesis of lumbar region. Patient has severe unremitting pain that affects sleep, daily activities, and work/hobbies. After pre-op clearance the patient was taken to the operating room on 08/30/2016 and underwent  Procedure(s): Lumbar  Decompression and Fusion L3-4 and L4-5 Transforaminal Lumbar Interbody Fusion with cages, pedicle screws and rods L3 to L5.    Patient was given perioperative antibiotics: Anti-infectives    Start     Dose/Rate Route Frequency Ordered Stop   08/30/16 1600  ceFAZolin (ANCEF) IVPB 1 g/50 mL premix     1 g 100 mL/hr over 30 Minutes Intravenous Every 8 hours 08/30/16 1434 08/31/16 0021   08/30/16 0700  ceFAZolin (ANCEF) IVPB 2g/100 mL premix     2 g 200 mL/hr over 30 Minutes Intravenous To ShortStay Surgical 08/29/16 1135 08/30/16 0750       Patient was given sequential compression devices, early ambulation, and chemoprophylaxis to prevent DVT.  Patient benefited maximally from hospital stay and there were no complications.    Recent vital signs: Patient Vitals for the past 24 hrs:  BP Temp Temp src Pulse Resp SpO2  09/02/16 0524 119/60 98.2 F (36.8 C) Oral 77 20 99 %  09/02/16 0027 122/69 98.3 F (36.8 C) Oral 78 20 98 %  09/01/16 2118 118/71 98.5 F (36.9 C) Oral 77 20 97 %  09/01/16 1833 112/70 98.7 F (37.1 C) Oral 70 18 98 %  09/01/16 1441 110/68 98 F (36.7 C) Oral 74 18 98 %  09/01/16 1100 99/68 98.1 F (36.7 C) Oral 76 18 98 %     Recent laboratory studies:  Recent Labs  08/31/16 0426  WBC 8.6  HGB 9.2*  HCT 29.3*  PLT 207  NA 136  K 3.5  CL 104  CO2 24  BUN 5*  CREATININE 0.71  GLUCOSE 125*  CALCIUM 8.3*     Discharge Medications:     Medication List    STOP taking these medications   HYDROcodone-acetaminophen 5-325 MG tablet Commonly known as:  NORCO/VICODIN     TAKE these medications   albuterol 108 (90 Base) MCG/ACT inhaler Commonly known as:  PROVENTIL HFA;VENTOLIN HFA Inhale 1-2 puffs into the lungs every 6 (six) hours as needed for wheezing or shortness of breath.   albuterol (2.5 MG/3ML) 0.083% nebulizer solution Commonly known as:  PROVENTIL Take 2.5 mg by nebulization 2 (two) times daily as needed for wheezing.   aspirin 325 MG EC  tablet Take 1 tablet (325 mg total) by mouth daily.   CALCIUM-D PO Take 1 tablet by mouth daily.   carvedilol 12.5 MG tablet Commonly known as:  COREG Take 1 tablet (12.5 mg total) by mouth 2 (two) times daily with a meal.   CLASSIC NETI POT SINUS WASH NA Place 2 sprays into the nose 2 (two) times daily as needed.   cyanocobalamin 1000 MCG/ML injection Commonly known as:  (VITAMIN B-12) Inject 1,000 mcg into the muscle once a week.   diazepam 10 MG tablet Commonly known as:  VALIUM Take 10 mg by mouth 3 (three) times daily as needed for anxiety.   FLUoxetine 20 MG capsule Commonly known as:  PROZAC Take 20 mg by mouth daily.   fluticasone 50 MCG/ACT nasal spray Commonly known as:  FLONASE Place 1 spray into the nose 2 (two) times daily.   furosemide 20 MG tablet Commonly known as:  LASIX Take 1 tablet (20 mg total) by mouth daily. What changed:  how much to take  when to take this  additional instructions   Halobetasol-Ammonium Lactate 0.05 & 12 % (Cream) Kit Apply 1 application topically daily as needed (dryness).   ICY HOT EX Apply 1 patch topically daily.   loratadine 10 MG tablet Commonly known as:  CLARITIN Take 10 mg by mouth daily.   losartan 50 MG tablet Commonly known as:  COZAAR Take 50 mg by mouth daily.   meloxicam 15 MG tablet Commonly known as:  MOBIC Take 15 mg by mouth daily as needed for pain.   methocarbamol 500 MG tablet Commonly known as:  ROBAXIN Take 1 tablet (500 mg total) by mouth every 6 (six) hours as needed for muscle spasms.   montelukast 10 MG tablet Commonly known as:  SINGULAIR Take 10 mg by mouth at bedtime.   multivitamin capsule Take 1 capsule by mouth daily.   nicotine 21 mg/24hr patch Commonly known as:  NICODERM CQ - dosed in mg/24 hours Place 1 patch (21 mg total) onto the skin daily. What changed:  when to take this  reasons to take this   NON FORMULARY Buminate ophth sol into the eyes two times a  day   omeprazole 20 MG capsule Commonly known as:  PRILOSEC Take 40 mg by mouth daily.   oxyCODONE-acetaminophen 5-325 MG tablet Commonly known as:  PERCOCET/ROXICET Take 1-2 tablets by mouth every 4 (four) hours as needed for moderate pain.   PATANASE 0.6 % Soln Generic drug:  Olopatadine HCl Place 2 drops into both nostrils 2 (two) times daily.   PROBIOTIC DAILY PO Take 1 capsule by mouth daily. Ultimate flora   pseudoephedrine-guaifenesin 60-600 MG  12 hr tablet Commonly known as:  MUCINEX D Take 1 tablet by mouth daily as needed for congestion.   ranitidine 300 MG capsule Commonly known as:  ZANTAC Take 300 mg by mouth every evening.   SYSTANE 0.4-0.3 % Gel ophthalmic gel Generic drug:  Polyethyl Glycol-Propyl Glycol Place 1 application into both eyes 2 (two) times daily.   VITAMIN B COMPLEX PO Take 1 tablet by mouth daily.       Diagnostic Studies: Dg Chest 2 View  Result Date: 08/23/2016 CLINICAL DATA:  Lumbar decompression and fusion L3-4 and L4-5. Preoperative examination. EXAM: CHEST  2 VIEW COMPARISON:  Chest x-ray dated 05/20/2015. FINDINGS: Heart size is normal. Overall cardiomediastinal silhouette is stable. Median sternotomy wires appear intact stable alignment. Left chest wall pacemaker/ICD is stable. Lungs are clear. No pleural effusion. Mild degenerative change within the slightly kyphotic thoracic spine. IMPRESSION: No active cardiopulmonary disease. Electronically Signed   By: Franki Cabot M.D.   On: 08/23/2016 12:49   Dg Lumbar Spine 2-3 Views  Result Date: 08/30/2016 CLINICAL DATA:  Lumbar decompression EXAM: LUMBAR SPINE - 2-3 VIEW COMPARISON:  04/11/2016 FINDINGS: Bilateral pedicle screws and cross stabilizing bars with interposed disc spacers have been placed at L3, L4, and L5. There is anatomic alignment. There is no breakage or loosening of the hardware. There is no vertebral compression deformity. IMPRESSION: Posterior fusion L3 through L5 without  apparent complication. Electronically Signed   By: Marybelle Killings M.D.   On: 08/30/2016 15:13   Dg C-arm Gt 120 Min  Result Date: 08/30/2016 CLINICAL DATA:  Lumbar decompressed EXAM: DG C-ARM GT 120 MIN CONTRAST:  None FLUOROSCOPY TIME:  Fluoroscopy Time:  47 seconds Radiation Exposure Index (if provided by the fluoroscopic device): Number of Acquired Spot Images: 4 COMPARISON:  None. FINDINGS: Fluoroscopy was utilized by the referral service. IMPRESSION: See above. Electronically Signed   By: Marybelle Killings M.D.   On: 08/30/2016 15:14    Disposition: 01-Home or Self Care    Follow-up Information    NITKA,JAMES E, MD Follow up in 2 week(s).   Specialty:  Orthopedic Surgery Why:  For wound re-check Contact information: [address] [place] [address] [phone]            Signed: Erskine Emery 09/02/2016, 8:48 AM

## 2016-09-02 NOTE — Discharge Instructions (Signed)
Apply brace when out of bed including when sitting Keep dressing clean and intact

## 2016-09-02 NOTE — Care Management Note (Signed)
Case Management Note  Patient Details  Name: Carol Warren MRN: [id number] Date of Birth: [birth year]  Subjective/Objective:                    Action/Plan: Pt discharging home with [facility] and DME. CM met with the patient and provided her a list of [facility] agencies in [place]. She selected [facility]. Ms Ezzard Flax with [facility] notified and information faxed to the number provided. Pt also with orders for walker and 3 in 1. Jermaine with [facility] DME notified and delivered the equipment to the room. Bedside RN updated.   Expected Discharge Date:                  Expected Discharge Plan:  [place]  In-House Referral:     Discharge planning Services  CM Consult  Post Acute Care Choice:  Durable Medical Equipment, Home Health Choice offered to:  Patient  DME Arranged:  3-N-1, Walker rolling DME Agency:  [place]:  PT [place]:  [place]  Status of Service:  Completed, signed off  If discussed at [place] of Stay Meetings, dates discussed:    Additional Comments:  Pollie Friar, RN 09/02/2016, 12:05 PM

## 2016-09-03 MED FILL — Sodium Chloride IV Soln 0.9%: INTRAVENOUS | Qty: 1000 | Status: AC

## 2016-09-03 MED FILL — Heparin Sodium (Porcine) Inj 1000 Unit/ML: INTRAMUSCULAR | Qty: 30 | Status: AC

## 2016-09-11 ENCOUNTER — Ambulatory Visit (INDEPENDENT_AMBULATORY_CARE_PROVIDER_SITE_OTHER): Payer: Medicare Other

## 2016-09-11 DIAGNOSIS — I5022 Chronic systolic (congestive) heart failure: Secondary | ICD-10-CM | POA: Diagnosis not present

## 2016-09-11 DIAGNOSIS — Z95810 Presence of automatic (implantable) cardiac defibrillator: Secondary | ICD-10-CM | POA: Diagnosis not present

## 2016-09-11 NOTE — Progress Notes (Signed)
EPIC Encounter for ICM Monitoring  Patient Name: Carol Warren is a 70 y.o. female Date: 09/11/2016 Primary Care Physican: Ernestene Kiel, MD Primary Cardiologist: Hochrein Electrophysiologist: Lovena Le Dry Weight: unknown Bi-V Pacing:  100%       Heart Failure questions reviewed, pt asymptomatic.  Patient reported she had back surgery and was discharged 9/4 which may contribute to some fluid accumulation.   Thoracic impedance abnormal suggesting fluid accumulation.  Recommendations:  Increase Furosemide 40 mg to bid x 2 days then return to prescribed dosage.  Patient verbalized understanding.  Follow-up plan: ICM clinic phone appointment on 09/17/2016.  Copy of ICM check sent to primary cardiologist and device physician.   ICM trend: 09/11/2016       Rosalene Billings, RN 09/11/2016 8:52 AM

## 2016-09-17 ENCOUNTER — Ambulatory Visit (INDEPENDENT_AMBULATORY_CARE_PROVIDER_SITE_OTHER): Payer: Medicare Other

## 2016-09-17 DIAGNOSIS — Z95810 Presence of automatic (implantable) cardiac defibrillator: Secondary | ICD-10-CM

## 2016-09-17 DIAGNOSIS — I5022 Chronic systolic (congestive) heart failure: Secondary | ICD-10-CM

## 2016-09-17 NOTE — Progress Notes (Signed)
EPIC Encounter for ICM Monitoring  Patient Name: Carol Warren is a 70 y.o. female Date: 09/17/2016 Primary Care Physican: PROCHNAU,CAROLINE, MD Primary [place] Electrophysiologist: Lovena Le Dry Weight: unknown Bi-V Pacing:  100%       Heart Failure questions reviewed, pt asymptomatic   Thoracic impedance returned to normal after increase Furosemide 40 mg to bid x 2 days on 09/11/2016.  Recommendations: No changes.  Low sodium diet education provided.    Follow-up plan: ICM clinic phone appointment on 10/14/2016.  Copy of ICM check sent to primary cardiologist and device physician.   ICM trend: 09/17/2016       Rosalene Billings, RN 09/17/2016 10:38 AM

## 2016-09-20 ENCOUNTER — Telehealth: Payer: Self-pay | Admitting: Cardiology

## 2016-09-20 NOTE — Telephone Encounter (Signed)
New message    Pt called stating that the doc gave approval for pt to have spine surgery but w/in the last week pt states that her heart rate has been higher than normal. Please call.

## 2016-09-20 NOTE — Anesthesia Postprocedure Evaluation (Signed)
Anesthesia Post Note  Patient: Carol Warren  Procedure(s) Performed: Procedure(s) (LRB): Lumbar Decompression and Fusion L3-4 and L4-5 Transforaminal Lumbar Interbody Fusion with cages, pedicle screws and rods L3 to L5 (N/A)  Patient location during evaluation: PACU Anesthesia Type: General Level of consciousness: awake and alert Pain management: pain level controlled Vital Signs Assessment: post-procedure vital signs reviewed and stable Respiratory status: spontaneous breathing, nonlabored ventilation, respiratory function stable and patient connected to nasal cannula oxygen Cardiovascular status: blood pressure returned to baseline and stable Postop Assessment: no signs of nausea or vomiting Anesthetic complications: no    Last Vitals:  Vitals:   09/02/16 0524 09/02/16 1058  BP: 119/60 121/64  Pulse: 77 84  Resp: 20 20  Temp: 36.8 C 37.1 C    Last Pain:  Vitals:   09/02/16 1058  TempSrc: Oral  PainSc:                  Jamia Hoban,JAMES TERRILL

## 2016-09-20 NOTE — Telephone Encounter (Signed)
Returned call to patient.She stated her heart rate has been elevated for the past 3 days ranging 98,106,115,105,104.Stated she is taking PT for recent back surgery.Also has a sinus infection taking antibiotics.She has not checked B/P.Advised to keep a diary of pulse and B/P.Advised to call back next week and report readings to triage and we will send results to Dr.Hochrein.

## 2016-09-21 NOTE — Telephone Encounter (Signed)
Agree 

## 2016-09-23 ENCOUNTER — Telehealth: Payer: Self-pay | Admitting: Cardiology

## 2016-09-23 NOTE — Telephone Encounter (Signed)
New message   Patient calling was advise to call back with heart rate reading . Patient states she had back fusion recently and physical therapy coming to the home.     Pt c/o BP issue: STAT if pt c/o blurred vision, one-sided weakness or slurred speech  1. What are your last 5 BP readings?  9.22.2017 146/80 heart rate 106  2. Are you having any other symptoms (ex. Dizziness, headache, blurred vision, passed out)?  Tired, nerve are shot due to surgery   3. What is your BP issue? Wants to discuss with nurse

## 2016-09-23 NOTE — Telephone Encounter (Signed)
Spoke w patient. Had thorough discussion. She notes HRs when taken recently 80s-low 100s. Currently being treated for URI w antibiotics & steroids. She also takes nebs on occasion. Aware that these can drive HR up. HRs typical for her and comparable to in-office checks. She also notes BPs in expected range of 120s-130s/70s-80s She is asymptomatic and has no acute complaints. Advised to continue current med regimen, check BP and HR when resting, and call if further concerns. Pt voiced thanks and acknowledged.

## 2016-10-14 ENCOUNTER — Ambulatory Visit (INDEPENDENT_AMBULATORY_CARE_PROVIDER_SITE_OTHER): Payer: Medicare Other

## 2016-10-14 DIAGNOSIS — I5022 Chronic systolic (congestive) heart failure: Secondary | ICD-10-CM | POA: Diagnosis not present

## 2016-10-14 DIAGNOSIS — Z95810 Presence of automatic (implantable) cardiac defibrillator: Secondary | ICD-10-CM | POA: Diagnosis not present

## 2016-10-14 NOTE — Progress Notes (Signed)
EPIC Encounter for ICM Monitoring  Patient Name: Carol Warren is a 70 y.o. female Date: 10/14/2016 Primary Care Physican: Ernestene Kiel, MD Primary Cardiologist:Hochrein Electrophysiologist: Lovena Le Dry Weight:    unknown Bi-V Pacing:  99.9%           Heart Failure questions reviewed, pt asymptomatic   Thoracic impedance normal   Recommendations: No changes.  Advised to limit salt intake to 2000 mg daily.  Encouraged to call for fluid symptoms.    Follow-up plan: ICM clinic phone appointment on 11/14/2016.  Copy of ICM check sent to device physician.   ICM trend: 10/14/2016       Rosalene Billings, RN 10/14/2016 8:25 AM

## 2016-10-30 ENCOUNTER — Encounter (INDEPENDENT_AMBULATORY_CARE_PROVIDER_SITE_OTHER): Payer: Self-pay | Admitting: Specialist

## 2016-10-30 ENCOUNTER — Encounter (INDEPENDENT_AMBULATORY_CARE_PROVIDER_SITE_OTHER): Payer: Self-pay

## 2016-10-30 ENCOUNTER — Ambulatory Visit (INDEPENDENT_AMBULATORY_CARE_PROVIDER_SITE_OTHER): Payer: Medicare Other | Admitting: Specialist

## 2016-10-30 VITALS — BP 165/85 | HR 90 | Ht 61.0 in | Wt 114.0 lb

## 2016-10-30 DIAGNOSIS — Z5321 Procedure and treatment not carried out due to patient leaving prior to being seen by health care provider: Secondary | ICD-10-CM

## 2016-10-30 NOTE — Progress Notes (Signed)
Patient left with out being seen by Dr. Louanne Skye

## 2016-10-30 NOTE — Progress Notes (Deleted)
Post-Op Visit Note   Patient: Carol Warren           Date of Birth: 06-May-1946           MRN: [id number] Visit Date: 10/30/2016 PCP: Ernestene Kiel, MD   Assessment & Plan:  Chief Complaint:  Patient returns for 1 month recheck. She is now 2 months post op lumbar decompression and fusion L3-4 and L4-5 transforaminal lumbar interbody fusion with cages pedical screws and rods L3 to L5. Patient Complains still having low back pain. States pain at level 4 out of 10 right now but does get worse at times. States that she is still having an achy, tired feeling in bilateral legs.  Visit Diagnoses: No diagnosis found.  Plan: ***  Follow-Up Instructions: No Follow-up on file.   Orders:  No orders of the defined types were placed in this encounter.  No orders of the defined types were placed in this encounter.    PMFS History: Patient Active Problem List   Diagnosis Date Noted  . Spondylolisthesis of lumbar region 08/30/2016    Class: Chronic  . DDD (degenerative disc disease), lumbar 08/30/2016  . Spinal stenosis, lumbar region, with neurogenic claudication 08/30/2016    Class: Chronic  . Lumbar degenerative disc disease 08/30/2016  . Dizziness 06/12/2015  . ICD (implantable cardioverter-defibrillator), biventricular, in situ 02/16/2015  . Chronic systolic heart failure ([place]) 02/16/2015  . Nonischemic cardiomyopathy ([place]) 01/19/2015  . Anemia, secondary to surgery 01/18/2015  . B12 deficiency 01/18/2015  . CHB (complete heart block)- post op 01/18/2015  . 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 Severe aortic stenosis 11/14/2014  . Pulmonary hypertension due to left ventricular systolic dysfunction 57/12/7791  . Personal history of colonic polyps 07/30/2013  . COPD GOLD 0/ still smoking  07/07/2013  . CIS (carcinoma in situ)   . Osteopenia   . HSV-1 (herpes simplex virus 1) infection   . HSV-2 (herpes simplex virus 2)  infection   . Bicuspid aortic valve   . Aortic aneurysm ([place])   . Adrenal tumor   . Degenerative disc disease   . Smoker    Past Medical History:  Diagnosis Date  . Adrenal tumor   . Anxiety   . Aortic aneurysm ([place])   . Asthma   . Bicuspid aortic valve    CONGENITAL  . Cancer ([place])    skin   . Carpal tunnel syndrome, bilateral   . Chronic depression   . Chronic fatigue   . Chronic sinusitis   . 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 COPD (chronic obstructive pulmonary disease) ([place])   . Degenerative disc disease    CERVICAL AND LUMBAR (DR. Lorin Mercy)  . GERD (gastroesophageal reflux disease)   . Hammer toe of right foot   . Headache   . Heart murmur   . Hepatomegaly   . HSV-1 (herpes simplex virus 1) infection   . HSV-2 (herpes simplex virus 2) infection   . Insomnia   . Lumbar stenosis   . Osteoarthritis   . Osteopenia   . Pernicious anemia   . Plantar fasciitis   . Pneumonia   . PONV (postoperative nausea and vomiting)   . Shortness of breath dyspnea   . Smoker   . Venous insufficiency of left leg   . Vitamin B 12 deficiency     Family History  Problem Relation Age of Onset  . Pancreatic cancer Mother 6  .  Diabetes Father   . Hypertension Father   . Heart disease Father 39    CAD  . Breast cancer Sister   . Diabetes Maternal Grandmother   . Hypertension Maternal Grandmother   . Cancer Maternal Grandfather     colon or stomach  . Hypertension Paternal Grandmother   . Heart disease Paternal Grandmother     Later onset  . Diverticulosis Paternal Grandmother   . Asthma Grandchild     Past Surgical History:  Procedure Laterality Date  . AORTIC VALVE REPLACEMENT N/A 01/12/2015   Procedure: AORTIC VALVE REPLACEMENT (AVR);  Surgeon: Gaye Pollack, MD;  Location: [place];  Service: Open Heart Surgery;  Laterality: N/A;  . ASCENDING AORTIC ROOT REPLACEMENT N/A 01/12/2015   Procedure: ASCENDING AORTIC ROOT REPLACEMENT;  Surgeon: Gaye Pollack, MD;   Location: [place];  Service: Open Heart Surgery;  Laterality: N/A;  . BI-VENTRICULAR PACEMAKER INSERTION N/A 01/18/2015   Procedure: BI-VENTRICULAR PACEMAKER INSERTION (CRT-P);  Surgeon: Evans Lance, MD;  Location: [facility] CATH LAB;  Service: Cardiovascular;  Laterality: N/A;  . BUNIONECTOMY WITH HAMMERTOE RECONSTRUCTION Right   . CARPAL TUNNEL RELEASE Bilateral 2008  . CATARACT EXTRACTION Bilateral    X2  . CATARACT EXTRACTION W/ INTRAOCULAR LENS  IMPLANT, BILATERAL    . COLONOSCOPY    . CYST EXCISION     right side of throat  . EXCISION OF BASAL CELL CA     SKIN   . EXCISION OF VULVAR CIS    . EYE SURGERY    . FUNCTIONAL ENDOSCOPIC SINUS SURGERY    . LEFT AND RIGHT HEART CATHETERIZATION WITH CORONARY ANGIOGRAM N/A 09/23/2014   Procedure: LEFT AND RIGHT HEART CATHETERIZATION WITH CORONARY ANGIOGRAM;  Surgeon: Minus Breeding, MD;  Location: [facility] CATH LAB;  Service: Cardiovascular;  Laterality: N/A;  . MULTIPLE TOOTH EXTRACTIONS    . SINUS PROCEDURE  2009   x 6  . TEE WITHOUT CARDIOVERSION N/A 10/06/2014   Procedure: TRANSESOPHAGEAL ECHOCARDIOGRAM (TEE);  Surgeon: Sueanne Margarita, MD;  Location: [facility] ENDOSCOPY;  Service: Cardiovascular;  Laterality: N/A;  . TEE WITHOUT CARDIOVERSION N/A 01/12/2015   Procedure: TRANSESOPHAGEAL ECHOCARDIOGRAM (TEE);  Surgeon: Gaye Pollack, MD;  Location: [place];  Service: Open Heart Surgery;  Laterality: N/A;  . UPPER GASTROINTESTINAL ENDOSCOPY     Social History   Occupational History  . retired Retired   Social History Main Topics  . Smoking status: Current Every Day Smoker    Packs/day: 1.00    Years: 50.00    Types: Cigarettes  . Smokeless tobacco: Never Used     Comment: uses vapor cig  . Alcohol use No  . Drug use: No  . Sexual activity: Not on file

## 2016-10-31 ENCOUNTER — Telehealth (INDEPENDENT_AMBULATORY_CARE_PROVIDER_SITE_OTHER): Payer: Self-pay | Admitting: Specialist

## 2016-10-31 NOTE — Telephone Encounter (Signed)
Will you please schedule her to come in next Tuesday he has opened up some time. Make her like the 1st or 2nd patient.  Thank you

## 2016-10-31 NOTE — Telephone Encounter (Signed)
Patient called advised she need to schedule another appointment with Dr Louanne Skye as soon as possible. Patient said she could not wait to see Dr Louanne Skye and had to leave.  Patient said she is still in a lot of pain.  The number to contact her is [phone]

## 2016-11-04 ENCOUNTER — Encounter (INDEPENDENT_AMBULATORY_CARE_PROVIDER_SITE_OTHER): Payer: Self-pay | Admitting: Specialist

## 2016-11-05 ENCOUNTER — Ambulatory Visit (INDEPENDENT_AMBULATORY_CARE_PROVIDER_SITE_OTHER): Payer: Medicare Other | Admitting: Specialist

## 2016-11-05 ENCOUNTER — Encounter (INDEPENDENT_AMBULATORY_CARE_PROVIDER_SITE_OTHER): Payer: Self-pay | Admitting: Specialist

## 2016-11-05 VITALS — BP 151/89 | HR 84 | Ht 61.0 in | Wt 115.0 lb

## 2016-11-05 DIAGNOSIS — M5136 Other intervertebral disc degeneration, lumbar region: Secondary | ICD-10-CM

## 2016-11-05 DIAGNOSIS — M4316 Spondylolisthesis, lumbar region: Secondary | ICD-10-CM

## 2016-11-05 MED ORDER — HYDROCODONE-ACETAMINOPHEN 5-325 MG PO TABS: 1.0000 | ORAL_TABLET | Freq: Four times a day (QID) | ORAL | 0 refills | Status: DC | PRN

## 2016-11-05 MED ORDER — METHOCARBAMOL 500 MG PO TABS: 500.0000 mg | ORAL_TABLET | Freq: Four times a day (QID) | ORAL | 1 refills | Status: DC | PRN

## 2016-11-05 NOTE — Patient Instructions (Signed)
Avoid frequent bending and stooping  No lifting greater than 10 lbs. May use ice or moist heat for pain. Weight loss is of benefit. Handicap license is approved.   

## 2016-11-05 NOTE — Progress Notes (Addendum)
Office Visit Note   Patient: Carol Warren           Date of Birth: 1946-08-04           MRN: [id number] Visit Date: 11/05/2016              Requested by: Ernestene Kiel, MD [address] [place], [place] [address] PCP: Ernestene Kiel, MD   Assessment & Plan: Visit Diagnoses:  1. DDD (degenerative disc disease), lumbar   2. Spondylolisthesis of lumbar region   This patient returns today almost 9 weeks following her surgery a 2 level fusion for degenerative disc disease and dynamic spondylolisthesis at L3-4 and L4-5. She reports overall pain levels are improved but she has a different kind of discomfort that is primarily in her back. Therapy has discharged her indicating that are no longer able to see a reason for continuing therapy as she has become very independent enough so that she can perform exercises and walking program on her own. She unfortunately continues to smoke fair amount 1 pack a day and has been advised to stop smoking. Her husband quit smoking some time ago and we discussed the reasons for stopping smoking including delayed or poor healing of lumbar fusion continued ongoing problems with back pain is cemented with smokers. Also changes in her overall functional level due to lung problems as well as her aging. She has been advised to contact her primary care physician if she requires medications to assist her in stopping smoking. I would advise her to be careful and her activity level as she does tend to want to do more she should work on a walking program avoiding bending stooping and lifting weights greater than 10 pounds. Should continue with her present brace during the daytime when out of bed. Expect that she may be able to resume some activities this next spring if her studies demonstrate healing of her fusion which certainly would be improved by stopping smoking. Return visit to follow up in about 3-4 weeks at which time we'll obtain x-rays to assess healing of her 2 level  fusion.  Plan: Avoid frequent bending and stooping  No lifting greater than 10 lbs. May use ice or moist heat for pain. Weight loss is of benefit. Handicap license is approved.    Follow-Up Instructions: Return in about 4 weeks (around 12/03/2016) for follow up lumbar fusion with rays..   Orders:  Orders Placed This Encounter  Procedures  . XR Lumbar Spine 2-3 Views   Meds ordered this encounter  Medications  . methocarbamol (ROBAXIN) 500 MG tablet    Sig: Take 1 tablet (500 mg total) by mouth every 6 (six) hours as needed for muscle spasms.    Dispense:  40 tablet    Refill:  1  . HYDROcodone-acetaminophen (NORCO/VICODIN) 5-325 MG tablet    Sig: Take 1 tablet by mouth every 6 (six) hours as needed for moderate pain.    Dispense:  60 tablet    Refill:  0      Procedures: No procedures performed   Clinical Data: No additional findings.   Subjective: Chief Complaint  Patient presents with  . Spine - Routine Post Op    Status post L3-4 and L4-5 TLIFs with pedicle screws and rods and cages. Working with PT at home released from [facility] one month ago. Walking unable to do with uneven surfaces in the neighborhood. Now can start with treadmill. Wearing her brace during the day. Okay without brace  at night and is able to bathe. No bowel or bladder symptoms, no numbness or weakness. Stiff in the AM with first getting up. Incision is healing well. Taking hydrocodone 1/2 every couple hours. Robaxin is being taken  Up to TID ran out so not taking any. Now on aspirin for her heart and post AVR and proximal aorta graft. Smoking one pack of cigarettes/day.    Routine PO lumbar decompression and fusion 9wks and 4 days PO, pt is feeling fine, Pt says she is doing better, pain still present but not as bad. Needs refill on Robaxin  Review of Systems  HENT: Positive for rhinorrhea, sinus pain, sneezing and sore throat.   Eyes: Negative.   Respiratory: Positive for choking, shortness  of breath and wheezing.   Cardiovascular: Negative for chest pain, palpitations and leg swelling.  Gastrointestinal: Negative.   Genitourinary: Negative.   Musculoskeletal: Positive for back pain.  Skin: Negative.   Allergic/Immunologic: Positive for environmental allergies. Negative for food allergies.  Neurological: Positive for weakness, numbness and headaches.  Hematological: Negative.   Psychiatric/Behavioral: Negative.      Objective: Vital Signs: BP (!) 151/89 (BP Location: Left Arm, Patient Position: Sitting, Cuff Size: Normal)   Pulse 84   Ht '5\' 1"'$  (1.549 m)   Wt 115 lb (52.2 kg)   BMI 21.73 kg/m   Physical Exam  Constitutional: She is oriented to person, place, and time. She appears well-developed and well-nourished.  Eyes: EOM are normal. Pupils are equal, round, and reactive to light.  Neck: Normal range of motion. Neck supple.  Pulmonary/Chest: Effort normal and breath sounds normal.  Abdominal: Soft. Bowel sounds are normal.  Musculoskeletal: She exhibits no edema, tenderness or deformity.  Neurological: She is alert and oriented to person, place, and time. She displays normal reflexes. No cranial nerve deficit or sensory deficit. She exhibits normal muscle tone. Coordination normal.  Skin: Skin is warm and dry.  Psychiatric: She has a normal mood and affect. Her behavior is normal. Judgment and thought content normal.    Back Exam   Tenderness  The patient is experiencing tenderness in the lumbar.  Range of Motion  Extension: abnormal  Flexion: abnormal  Lateral Bend Right: abnormal  Lateral Bend Left: abnormal  Rotation Right: abnormal  Rotation Left: abnormal   Muscle Strength  Right Quadriceps:  5/5  Left Quadriceps:  5/5  Right Hamstrings:  5/5  Left Hamstrings:  5/5   Tests  Straight leg raise right: negative Straight leg raise left: negative  Reflexes  Patellar: normal Achilles: normal  Other  Toe Walk: normal Heel Walk:  normal Sensation: normal Erythema: no back redness Scars: present      Specialty Comments:  No specialty comments available.  Imaging: No results found.   PMFS History: Patient Active Problem List   Diagnosis Date Noted  . Spondylolisthesis of lumbar region 08/30/2016    Priority: High    Class: Chronic  . DDD (degenerative disc disease), lumbar 08/30/2016    Priority: High  . Spinal stenosis, lumbar region, with neurogenic claudication 08/30/2016    Priority: High    Class: Chronic  . Lumbar degenerative disc disease 08/30/2016  . Dizziness 06/12/2015  . ICD (implantable cardioverter-defibrillator), biventricular, in situ 02/16/2015  . Chronic systolic heart failure ([place]) 02/16/2015  . Nonischemic cardiomyopathy ([place]) 01/19/2015  . Anemia, secondary to surgery 01/18/2015  . B12 deficiency 01/18/2015  . CHB (complete heart block)- post op 01/18/2015  . S/P AVR -Edwards Magna-Ease pericardial valve  and bentall proccedure 01/12/15 01/12/2015  . Severe aortic stenosis 11/14/2014  . Pulmonary hypertension due to left ventricular systolic dysfunction 38/09/1750  . Personal history of colonic polyps 07/30/2013  . COPD GOLD 0/ still smoking  07/07/2013  . CIS (carcinoma in situ)   . Osteopenia   . HSV-1 (herpes simplex virus 1) infection   . HSV-2 (herpes simplex virus 2) infection   . Bicuspid aortic valve   . Aortic aneurysm ([place])   . Adrenal tumor   . Degenerative disc disease   . Smoker    Past Medical History:  Diagnosis Date  . Adrenal tumor   . Anxiety   . Aortic aneurysm ([place])   . Asthma   . Bicuspid aortic valve    CONGENITAL  . Cancer ([place])    skin   . Carpal tunnel syndrome, bilateral   . Chronic depression   . Chronic fatigue   . Chronic sinusitis   . 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 COPD (chronic obstructive pulmonary disease) ([place])   . Degenerative disc disease    CERVICAL AND LUMBAR (DR. Lorin Mercy)  . GERD (gastroesophageal reflux  disease)   . Hammer toe of right foot   . Headache   . Heart murmur   . Hepatomegaly   . HSV-1 (herpes simplex virus 1) infection   . HSV-2 (herpes simplex virus 2) infection   . Insomnia   . Lumbar stenosis   . Osteoarthritis   . Osteopenia   . Pernicious anemia   . Plantar fasciitis   . Pneumonia   . PONV (postoperative nausea and vomiting)   . Shortness of breath dyspnea   . Smoker   . Venous insufficiency of left leg   . Vitamin B 12 deficiency     Family History  Problem Relation Age of Onset  . Pancreatic cancer Mother 26  . Diabetes Father   . Hypertension Father   . Heart disease Father 28    CAD  . Breast cancer Sister   . Diabetes Maternal Grandmother   . Hypertension Maternal Grandmother   . Cancer Maternal Grandfather     colon or stomach  . Hypertension Paternal Grandmother   . Heart disease Paternal Grandmother     Later onset  . Diverticulosis Paternal Grandmother   . Asthma Grandchild     Past Surgical History:  Procedure Laterality Date  . AORTIC VALVE REPLACEMENT N/A 01/12/2015   Procedure: AORTIC VALVE REPLACEMENT (AVR);  Surgeon: Gaye Pollack, MD;  Location: [place];  Service: Open Heart Surgery;  Laterality: N/A;  . ASCENDING AORTIC ROOT REPLACEMENT N/A 01/12/2015   Procedure: ASCENDING AORTIC ROOT REPLACEMENT;  Surgeon: Gaye Pollack, MD;  Location: [place];  Service: Open Heart Surgery;  Laterality: N/A;  . BI-VENTRICULAR PACEMAKER INSERTION N/A 01/18/2015   Procedure: BI-VENTRICULAR PACEMAKER INSERTION (CRT-P);  Surgeon: Evans Lance, MD;  Location[facility] CATH LAB;  Service: Cardiovascular;  Laterality: N/A;  . BUNIONECTOMY WITH HAMMERTOE RECONSTRUCTION Right   . CARPAL TUNNEL RELEASE Bilateral 2008  . CATARACT EXTRACTION Bilateral    X2  . CATARACT EXTRACTION W/ INTRAOCULAR LENS  IMPLANT, BILATERAL    . COLONOSCOPY    . CYST EXCISION     right side of throat  . EXCISION OF BASAL CELL CA     SKIN   . EXCISION OF VULVAR CIS    . EYE SURGERY     . FUNCTIONAL ENDOSCOPIC SINUS SURGERY    . LEFT  AND RIGHT HEART CATHETERIZATION WITH CORONARY ANGIOGRAM N/A 09/23/2014   Procedure: LEFT AND RIGHT HEART CATHETERIZATION WITH CORONARY ANGIOGRAM;  Surgeon: Minus Breeding, MD;  Location[facility] CATH LAB;  Service: Cardiovascular;  Laterality: N/A;  . MULTIPLE TOOTH EXTRACTIONS    . SINUS PROCEDURE  2009   x 6  . TEE WITHOUT CARDIOVERSION N/A 10/06/2014   Procedure: TRANSESOPHAGEAL ECHOCARDIOGRAM (TEE);  Surgeon: Sueanne Margarita, MD;  Location: [facility] ENDOSCOPY;  Service: Cardiovascular;  Laterality: N/A;  . TEE WITHOUT CARDIOVERSION N/A 01/12/2015   Procedure: TRANSESOPHAGEAL ECHOCARDIOGRAM (TEE);  Surgeon: Gaye Pollack, MD;  Location: [place];  Service: Open Heart Surgery;  Laterality: N/A;  . UPPER GASTROINTESTINAL ENDOSCOPY     Social History   Occupational History  . retired Retired   Social History Main Topics  . Smoking status: Current Every Day Smoker    Packs/day: 1.00    Years: 50.00    Types: Cigarettes  . Smokeless tobacco: Never Used     Comment: uses vapor cig  . Alcohol use No  . Drug use: No  . Sexual activity: Not on file

## 2016-11-07 ENCOUNTER — Encounter: Payer: Self-pay | Admitting: Physician Assistant

## 2016-11-07 ENCOUNTER — Encounter (INDEPENDENT_AMBULATORY_CARE_PROVIDER_SITE_OTHER): Payer: Self-pay | Admitting: Specialist

## 2016-11-11 ENCOUNTER — Telehealth (INDEPENDENT_AMBULATORY_CARE_PROVIDER_SITE_OTHER): Payer: Self-pay | Admitting: Specialist

## 2016-11-11 NOTE — Telephone Encounter (Signed)
I called and spoke to Carol Warren, she had some mild increased discomfort in her back this last couple of days. She has adequate medications and is using ice packs for discomfort and some rest. If the pain continues then we would have her come in early.

## 2016-11-11 NOTE — Telephone Encounter (Signed)
Patient called asked when she should come and get an Xray of her back. Patient said there is something wrong with her lower back  The number to contact her is [phone]

## 2016-11-11 NOTE — Telephone Encounter (Signed)
Do you want patient to come back in for xrays? Was seen on 11/05/16. Please advise.

## 2016-11-14 ENCOUNTER — Ambulatory Visit (INDEPENDENT_AMBULATORY_CARE_PROVIDER_SITE_OTHER): Payer: Medicare Other | Admitting: *Deleted

## 2016-11-14 DIAGNOSIS — Z95810 Presence of automatic (implantable) cardiac defibrillator: Secondary | ICD-10-CM | POA: Diagnosis not present

## 2016-11-14 DIAGNOSIS — I5022 Chronic systolic (congestive) heart failure: Secondary | ICD-10-CM | POA: Diagnosis not present

## 2016-11-14 DIAGNOSIS — I428 Other cardiomyopathies: Secondary | ICD-10-CM | POA: Diagnosis not present

## 2016-11-14 NOTE — Progress Notes (Signed)
Remote ICD transmission.   

## 2016-11-15 NOTE — Progress Notes (Signed)
EPIC Encounter for ICM Monitoring  Patient Name: Carol Warren is a 70 y.o. female Date: 11/15/2016 Primary Care Physican: Ernestene Kiel, MD Primary Cardiologist:Hochrein Electrophysiologist: Lovena Le Dry Weight:unknown Bi-V Pacing: 99.9%         Heart Failure questions reviewed, pt asymptomatic but does have bronchitis  Thoracic impedance normal   Recommendations: No changes.  Reinforced low salt food choices and limiting fluid intake to < 2 liters per day. Encouraged to call for fluid symptoms.    Follow-up plan: ICM clinic phone appointment on 12/17/2016.  Copy of ICM check sent to device physician.   ICM trend: 11/14/2016       Rosalene Billings, RN 11/15/2016 1:11 PM

## 2016-11-20 ENCOUNTER — Encounter: Payer: Self-pay | Admitting: Cardiology

## 2016-11-20 DIAGNOSIS — M19041 Primary osteoarthritis, right hand: Secondary | ICD-10-CM | POA: Insufficient documentation

## 2016-11-20 DIAGNOSIS — M6740 Ganglion, unspecified site: Secondary | ICD-10-CM | POA: Insufficient documentation

## 2016-11-23 LAB — CUP PACEART REMOTE DEVICE CHECK
Battery Remaining Longevity: 48 mo
Battery Voltage: 2.97 V
Brady Statistic AP VS Percent: 0.01 %
Brady Statistic AS VS Percent: 0.05 %
Brady Statistic RV Percent Paced: 99.91 %
HighPow Impedance: 65 Ohm
Implantable Lead Implant Date: 20160120
Implantable Lead Implant Date: 20160120
Implantable Lead Location: 753859
Implantable Lead Location: 753860
Implantable Lead Model: 6935
Implantable Pulse Generator Implant Date: 20160120
Lead Channel Impedance Value: 342 Ohm
Lead Channel Impedance Value: 475 Ohm
Lead Channel Impedance Value: 589 Ohm
Lead Channel Impedance Value: 646 Ohm
Lead Channel Impedance Value: 703 Ohm
Lead Channel Impedance Value: 760 Ohm
Lead Channel Impedance Value: 779 Ohm
Lead Channel Pacing Threshold Amplitude: 1.625 V
Lead Channel Pacing Threshold Pulse Width: 0.4 ms
Lead Channel Pacing Threshold Pulse Width: 0.4 ms
Lead Channel Sensing Intrinsic Amplitude: 1.25 mV
Lead Channel Sensing Intrinsic Amplitude: 1.25 mV
Lead Channel Setting Pacing Amplitude: 1.5 V
Lead Channel Setting Pacing Amplitude: 2 V
Lead Channel Setting Pacing Amplitude: 2.75 V
Lead Channel Setting Pacing Pulse Width: 0.4 ms
Lead Channel Setting Pacing Pulse Width: 0.4 ms
MDC IDC LEAD IMPLANT DT: 20160120
MDC IDC LEAD LOCATION: 753858
MDC IDC LEAD MODEL: 4598
MDC IDC MSMT LEADCHNL LV IMPEDANCE VALUE: 399 Ohm
MDC IDC MSMT LEADCHNL LV IMPEDANCE VALUE: 418 Ohm
MDC IDC MSMT LEADCHNL LV IMPEDANCE VALUE: 532 Ohm
MDC IDC MSMT LEADCHNL LV IMPEDANCE VALUE: 665 Ohm
MDC IDC MSMT LEADCHNL LV IMPEDANCE VALUE: 817 Ohm
MDC IDC MSMT LEADCHNL RA IMPEDANCE VALUE: 399 Ohm
MDC IDC MSMT LEADCHNL RA PACING THRESHOLD AMPLITUDE: 0.5 V
MDC IDC MSMT LEADCHNL RV PACING THRESHOLD AMPLITUDE: 0.625 V
MDC IDC MSMT LEADCHNL RV PACING THRESHOLD PULSEWIDTH: 0.4 ms
MDC IDC MSMT LEADCHNL RV SENSING INTR AMPL: 27 mV
MDC IDC MSMT LEADCHNL RV SENSING INTR AMPL: 27 mV
MDC IDC SESS DTM: 20171116062204
MDC IDC SET LEADCHNL RV SENSING SENSITIVITY: 0.45 mV
MDC IDC STAT BRADY AP VP PERCENT: 0.09 %
MDC IDC STAT BRADY AS VP PERCENT: 99.85 %
MDC IDC STAT BRADY RA PERCENT PACED: 0.1 %

## 2016-11-27 ENCOUNTER — Telehealth: Payer: Self-pay | Admitting: *Deleted

## 2016-11-27 ENCOUNTER — Other Ambulatory Visit: Payer: Self-pay | Admitting: Orthopedic Surgery

## 2016-11-27 NOTE — Telephone Encounter (Signed)
Requesting surgical clearance:   1. Type of surgery: Excision Cyst Debridement DIP Joint Right Index Finger  2. Surgeon: Dr Shona Simpson  3. Surgical date: 12/19/2016  4. Medications that need to be help: Aspirin  5. [facility]: [phone] Attn: Cyndee Brightly, RN   Pt saw you on 04/30/2016, Is pt cleared for surgery and how long can Aspirin be held?

## 2016-11-28 NOTE — Telephone Encounter (Signed)
She is at acceptable risk for surgery and can hold ASA if she needs to for this procedure.

## 2016-12-02 NOTE — Telephone Encounter (Signed)
Clearance faxed to [facility] via [employer]

## 2016-12-09 ENCOUNTER — Other Ambulatory Visit (INDEPENDENT_AMBULATORY_CARE_PROVIDER_SITE_OTHER): Payer: Self-pay | Admitting: Specialist

## 2016-12-09 ENCOUNTER — Telehealth (INDEPENDENT_AMBULATORY_CARE_PROVIDER_SITE_OTHER): Payer: Self-pay | Admitting: Specialist

## 2016-12-09 DIAGNOSIS — M4316 Spondylolisthesis, lumbar region: Secondary | ICD-10-CM

## 2016-12-09 DIAGNOSIS — M5136 Other intervertebral disc degeneration, lumbar region: Secondary | ICD-10-CM

## 2016-12-09 MED ORDER — HYDROCODONE-ACETAMINOPHEN 5-325 MG PO TABS: 1.0000 | ORAL_TABLET | Freq: Four times a day (QID) | ORAL | 0 refills | Status: DC | PRN

## 2016-12-09 NOTE — Telephone Encounter (Signed)
Rx refill Hydrocodine

## 2016-12-09 NOTE — Telephone Encounter (Signed)
Ok to refill hydrocodone?

## 2016-12-10 NOTE — Progress Notes (Signed)
Patient is aware that rx is ready for pick up

## 2016-12-11 ENCOUNTER — Encounter: Payer: Self-pay | Admitting: Cardiology

## 2016-12-17 ENCOUNTER — Ambulatory Visit (INDEPENDENT_AMBULATORY_CARE_PROVIDER_SITE_OTHER): Payer: Medicare Other

## 2016-12-17 DIAGNOSIS — Z95810 Presence of automatic (implantable) cardiac defibrillator: Secondary | ICD-10-CM | POA: Diagnosis not present

## 2016-12-17 DIAGNOSIS — I5022 Chronic systolic (congestive) heart failure: Secondary | ICD-10-CM

## 2016-12-17 NOTE — Progress Notes (Signed)
EPIC Encounter for ICM Monitoring  Patient Name: Carol Warren is a 70 y.o. female Date: 12/17/2016 Primary Care Physican: Ernestene Kiel, MD Primary Cardiologist:Hochrein Electrophysiologist: Lovena Le Dry Weight:unknown Bi-V Pacing: 99.9%      Heart Failure questions reviewed, pt asymptomatic   Thoracic impedance normal   Recommendations: No changes.  Reinforced to limit low salt food choices to 2000 mg day and limiting fluid intake to < 2 liters per day. Encouraged to call for fluid symptoms.  Provided ICM direct number.   Follow-up plan: ICM clinic phone appointment on 01/17/2017.  Copy of ICM check sent to device physician.   ICM trend: 12/17/2016       Rosalene Billings, RN 12/17/2016 8:10 AM

## 2016-12-18 ENCOUNTER — Ambulatory Visit (INDEPENDENT_AMBULATORY_CARE_PROVIDER_SITE_OTHER): Payer: Medicare Other

## 2016-12-18 ENCOUNTER — Ambulatory Visit (INDEPENDENT_AMBULATORY_CARE_PROVIDER_SITE_OTHER): Payer: Medicare Other | Admitting: Specialist

## 2016-12-18 ENCOUNTER — Encounter (INDEPENDENT_AMBULATORY_CARE_PROVIDER_SITE_OTHER): Payer: Self-pay | Admitting: Specialist

## 2016-12-18 VITALS — BP 160/84 | HR 85 | Ht 61.0 in | Wt 115.0 lb

## 2016-12-18 DIAGNOSIS — M4316 Spondylolisthesis, lumbar region: Secondary | ICD-10-CM

## 2016-12-18 DIAGNOSIS — M5136 Other intervertebral disc degeneration, lumbar region: Secondary | ICD-10-CM

## 2016-12-18 NOTE — Progress Notes (Signed)
Office Visit Note   Patient: Carol Warren           Date of Birth: [birth year]           MRN: [id number] Visit Date: 12/18/2016              Requested by: Ernestene Kiel, MD [address] [place], [place] [address] PCP: Ernestene Kiel, MD   Assessment & Plan: Visit Diagnoses:  1. DDD (degenerative disc disease), lumbar   2. Spondylolisthesis of lumbar region     Plan: No lifting greater than 10 lbs. Avoid bending, stooping and twisting. Use brace when sitting and out of bed even to go to bathroom. Walk in house for first 2 weeks then may start to get out slowly increasing distances up to one mile by 4-6 weeks post op.    Follow-Up Instructions: Return in about 6 weeks (around 01/29/2017).   Orders:  Orders Placed This Encounter  Procedures  . XR Lumbar Spine 2-3 Views   No orders of the defined types were placed in this encounter.     Procedures: No procedures performed   Clinical Data: No additional findings.   Subjective: Chief Complaint  Patient presents with  . Lower Back - Follow-up    Carol Warren is here to followup for her back, she is 3.5 months out from [place].  She says that she still has pain but it is not as bad as it was.  Still having pain, she is doing chores at home and reaching into the washer and dryer. She in bending and lifting of laundry and dishwasher loading. Pain is all in the back and she is requesting continuing her narcotic medication. She is still smoking a pack a day.     Review of Systems  Constitutional: Negative.   HENT: Negative.   Eyes: Negative.   Respiratory: Negative.   Cardiovascular: Negative.   Gastrointestinal: Negative.   Endocrine: Negative.   Genitourinary: Negative.   Musculoskeletal: Negative.   Allergic/Immunologic: Negative.   Hematological: Negative.   Psychiatric/Behavioral: Negative.      Objective: Vital Signs: BP (!) 160/84 (BP Location: Left Arm, Patient Position: Sitting)   Pulse 85   Ht 5'  1" (1.549 m)   Wt 115 lb (52.2 kg)   BMI 21.73 kg/m   Physical Exam  Constitutional: She is oriented to person, place, and time. She appears well-developed and well-nourished.  HENT:  Head: Normocephalic and atraumatic.  Eyes: EOM are normal. Pupils are equal, round, and reactive to light.  Neck: Normal range of motion. Neck supple.  Pulmonary/Chest: Effort normal and breath sounds normal.  Abdominal: Soft. Bowel sounds are normal.  Musculoskeletal: She exhibits tenderness. She exhibits no edema or deformity.  Neurological: She is alert and oriented to person, place, and time.  Skin: Skin is warm and dry.  Psychiatric: She has a normal mood and affect. Her behavior is normal. Judgment and thought content normal.    Back Exam   Tenderness  The patient is experiencing tenderness in the lumbar.  Range of Motion  Extension: normal  Flexion: abnormal  Lateral Bend Right: abnormal  Lateral Bend Left: abnormal  Rotation Right: abnormal  Rotation Left: abnormal   Muscle Strength  Right Quadriceps:  5/5  Left Quadriceps:  5/5  Right Hamstrings:  5/5  Left Hamstrings:  5/5   Tests  Straight leg raise right: negative  Reflexes  Patellar: normal Achilles: normal Babinski's sign: normal   Other  Toe Walk: normal  Heel Walk: normal Sensation: normal Gait: normal  Erythema: no back redness Scars: present      Specialty Comments:  No specialty comments available.  Imaging: No results found.   PMFS History: Patient Active Problem List   Diagnosis Date Noted  . Spondylolisthesis of lumbar region 08/30/2016    Priority: High    Class: Chronic  . DDD (degenerative disc disease), lumbar 08/30/2016    Priority: High  . Spinal stenosis, lumbar region, with neurogenic claudication 08/30/2016    Priority: High    Class: Chronic  . Mucoid cyst of joint 11/20/2016  . Osteoarthritis of finger of right hand 11/20/2016  . Lumbar degenerative disc disease 08/30/2016  .  Dizziness 06/12/2015  . ICD (implantable cardioverter-defibrillator), biventricular, in situ 02/16/2015  . Chronic systolic heart failure ([place]) 02/16/2015  . Nonischemic cardiomyopathy ([place]) 01/19/2015  . Anemia, secondary to surgery 01/18/2015  . B12 deficiency 01/18/2015  . CHB (complete heart block)- post op 01/18/2015  . 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 Severe aortic stenosis 11/14/2014  . Pulmonary hypertension due to left ventricular systolic dysfunction 19/50/9326  . Personal history of colonic polyps 07/30/2013  . COPD GOLD 0/ still smoking  07/07/2013  . CIS (carcinoma in situ)   . Osteopenia   . HSV-1 (herpes simplex virus 1) infection   . HSV-2 (herpes simplex virus 2) infection   . Bicuspid aortic valve   . Aortic aneurysm ([place])   . Adrenal tumor   . Degenerative disc disease   . Smoker    Past Medical History:  Diagnosis Date  . Adrenal tumor   . Anxiety   . Aortic aneurysm ([place])   . Asthma   . Bicuspid aortic valve    CONGENITAL  . Cancer ([place])    skin   . Carpal tunnel syndrome, bilateral   . Chronic depression   . Chronic fatigue   . Chronic sinusitis   . 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 COPD (chronic obstructive pulmonary disease) ([place])   . Degenerative disc disease    CERVICAL AND LUMBAR (DR. Lorin Mercy)  . GERD (gastroesophageal reflux disease)   . Hammer toe of right foot   . Headache   . Heart murmur   . Hepatomegaly   . HSV-1 (herpes simplex virus 1) infection   . HSV-2 (herpes simplex virus 2) infection   . Insomnia   . Lumbar stenosis   . Osteoarthritis   . Osteopenia   . Pernicious anemia   . Plantar fasciitis   . Pneumonia   . PONV (postoperative nausea and vomiting)   . Shortness of breath dyspnea   . Smoker   . Venous insufficiency of left leg   . Vitamin B 12 deficiency     Family History  Problem Relation Age of Onset  . Pancreatic cancer Mother 47  . Diabetes  Father   . Hypertension Father   . Heart disease Father 27    CAD  . Breast cancer Sister   . Diabetes Maternal Grandmother   . Hypertension Maternal Grandmother   . Cancer Maternal Grandfather     colon or stomach  . Hypertension Paternal Grandmother   . Heart disease Paternal Grandmother     Later onset  . Diverticulosis Paternal Grandmother   . Asthma Grandchild     Past Surgical History:  Procedure Laterality Date  . AORTIC VALVE REPLACEMENT N/A 01/12/2015   Procedure: AORTIC VALVE REPLACEMENT (AVR);  Surgeon: Gaye Pollack, MD;  Location: [place];  Service: Open Heart Surgery;  Laterality: N/A;  . ASCENDING AORTIC ROOT REPLACEMENT N/A 01/12/2015   Procedure: ASCENDING AORTIC ROOT REPLACEMENT;  Surgeon: Gaye Pollack, MD;  Location: [facility];  Service: Open Heart Surgery;  Laterality: N/A;  . BI-VENTRICULAR PACEMAKER INSERTION N/A 01/18/2015   Procedure: BI-VENTRICULAR PACEMAKER INSERTION (CRT-P);  Surgeon: Evans Lance, MD;  [facility] LAB;  Service: Cardiovascular;  Laterality: N/A;  . BUNIONECTOMY WITH HAMMERTOE RECONSTRUCTION Right   . CARPAL TUNNEL RELEASE Bilateral 2008  . CATARACT EXTRACTION Bilateral    X2  . CATARACT EXTRACTION W/ INTRAOCULAR LENS  IMPLANT, BILATERAL    . COLONOSCOPY    . CYST EXCISION     right side of throat  . EXCISION OF BASAL CELL CA     SKIN   . EXCISION OF VULVAR CIS    . EYE SURGERY    . FUNCTIONAL ENDOSCOPIC SINUS SURGERY    . LEFT AND RIGHT HEART CATHETERIZATION WITH CORONARY ANGIOGRAM N/A 09/23/2014   Procedure: LEFT AND RIGHT HEART CATHETERIZATION WITH CORONARY ANGIOGRAM;  Surgeon: Minus Breeding, MD;  Location: [facility] CATH LAB;  Service: Cardiovascular;  Laterality: N/A;  . MULTIPLE TOOTH EXTRACTIONS    . SINUS PROCEDURE  2009   x 6  . TEE WITHOUT CARDIOVERSION N/A 10/06/2014   Procedure: TRANSESOPHAGEAL ECHOCARDIOGRAM (TEE);  Surgeon: Sueanne Margarita, MD;  Location: [facility] ENDOSCOPY;  Service: Cardiovascular;  Laterality: N/A;  . TEE  WITHOUT CARDIOVERSION N/A 01/12/2015   Procedure: TRANSESOPHAGEAL ECHOCARDIOGRAM (TEE);  Surgeon: Gaye Pollack, MD;  Location: [place];  Service: Open Heart Surgery;  Laterality: N/A;  . UPPER GASTROINTESTINAL ENDOSCOPY     Social History   Occupational History  . retired Retired   Social History Main Topics  . Smoking status: Current Every Day Smoker    Packs/day: 1.00    Years: 50.00    Types: Cigarettes  . Smokeless tobacco: Never Used     Comment: uses vapor cig  . Alcohol use No  . Drug use: No  . Sexual activity: Not on file

## 2016-12-19 NOTE — Patient Instructions (Signed)
   No lifting greater than 10 lbs. Avoid bending, stooping and twisting. Use brace when sitting and out of bed even to go to bathroom. Walk in house for first 2 weeks then may start to get out slowly increasing distances up to one mile by 4-6 weeks post op.

## 2016-12-26 ENCOUNTER — Telehealth (INDEPENDENT_AMBULATORY_CARE_PROVIDER_SITE_OTHER): Payer: Self-pay | Admitting: Specialist

## 2016-12-31 NOTE — Telephone Encounter (Signed)
Notes faxed.

## 2017-01-09 ENCOUNTER — Ambulatory Visit (HOSPITAL_BASED_OUTPATIENT_CLINIC_OR_DEPARTMENT_OTHER): Admission: RE | Admit: 2017-01-09 | Payer: Medicare Other | Source: Ambulatory Visit | Admitting: Orthopedic Surgery

## 2017-01-09 ENCOUNTER — Encounter (HOSPITAL_BASED_OUTPATIENT_CLINIC_OR_DEPARTMENT_OTHER): Admission: RE | Payer: Self-pay | Source: Ambulatory Visit

## 2017-01-09 SURGERY — EXCISION MASS
Anesthesia: Monitor Anesthesia Care | Laterality: Right

## 2017-01-13 ENCOUNTER — Telehealth (INDEPENDENT_AMBULATORY_CARE_PROVIDER_SITE_OTHER): Payer: Self-pay | Admitting: Specialist

## 2017-01-13 NOTE — Telephone Encounter (Signed)
Carol Warren needs a on her pain meds

## 2017-01-17 ENCOUNTER — Ambulatory Visit (INDEPENDENT_AMBULATORY_CARE_PROVIDER_SITE_OTHER): Payer: Medicare Other

## 2017-01-17 DIAGNOSIS — I5022 Chronic systolic (congestive) heart failure: Secondary | ICD-10-CM

## 2017-01-17 DIAGNOSIS — Z95810 Presence of automatic (implantable) cardiac defibrillator: Secondary | ICD-10-CM

## 2017-01-17 NOTE — Progress Notes (Signed)
EPIC Encounter for ICM Monitoring  Patient Name: Carol Warren is a 71 y.o. female Date: 01/17/2017 Primary Care Physican: Ernestene Kiel, MD Primary Cardiologist:Hochrein Electrophysiologist: Lovena Le Dry Weight:118 lbs Bi-V Pacing: 100%            Heart Failure questions reviewed, pt asymptomatic   Thoracic impedance abnormal suggesting fluid accumulation.  Recommendations:  Increased Furosemide 40 mg 1 tablet to bid x 3 days and then after 3rd day return back to prior dosage of 1 tablet daily.   Follow-up plan: ICM clinic phone appointment on 01/21/2017 to recheck fluid levels.  Office appointment with Dr Lovena Le on 02/07/2017  Copy of ICM check sent to primary cardiologist and device physician.   3 month ICM trend: 01/17/2017   1 Year ICM trend:      Rosalene Billings, RN 01/17/2017 2:39 PM

## 2017-01-20 ENCOUNTER — Telehealth (INDEPENDENT_AMBULATORY_CARE_PROVIDER_SITE_OTHER): Payer: Self-pay | Admitting: Specialist

## 2017-01-20 ENCOUNTER — Telehealth: Payer: Self-pay | Admitting: Internal Medicine

## 2017-01-20 ENCOUNTER — Other Ambulatory Visit (INDEPENDENT_AMBULATORY_CARE_PROVIDER_SITE_OTHER): Payer: Self-pay | Admitting: Specialist

## 2017-01-20 DIAGNOSIS — M4316 Spondylolisthesis, lumbar region: Secondary | ICD-10-CM

## 2017-01-20 DIAGNOSIS — M5136 Other intervertebral disc degeneration, lumbar region: Secondary | ICD-10-CM

## 2017-01-20 MED ORDER — HYDROCODONE-ACETAMINOPHEN 5-325 MG PO TABS: 1.0000 | ORAL_TABLET | Freq: Four times a day (QID) | ORAL | 0 refills | Status: DC | PRN

## 2017-01-20 NOTE — Telephone Encounter (Signed)
Patient is aware that rx is ready for pick u at the front desk.

## 2017-01-20 NOTE — Progress Notes (Signed)
Patient is aware rx is ready for pick up 

## 2017-01-20 NOTE — Telephone Encounter (Signed)
I called and advised that Dr. Louanne Skye has the message in his box, but he was out of the office Tuesday and we were closed Wednesday and Thursday due to weather. I advised that he will hopefully be addressed today.

## 2017-01-20 NOTE — Telephone Encounter (Signed)
New Message      Pt has pacemaker and defibilator  and they used spinalogic bone growth stimulator on her today for 30 minutes, the instructions said not to use if she has pacemaker , is something gonna happen to her

## 2017-01-20 NOTE — Telephone Encounter (Signed)
Pt called back asking for status of meds refill

## 2017-01-20 NOTE — Telephone Encounter (Signed)
Rx for hydrocodone printed and signed for patient to pick up, she needs to stop taking narcotics as she is over 3 months post op.jen

## 2017-01-21 ENCOUNTER — Ambulatory Visit (INDEPENDENT_AMBULATORY_CARE_PROVIDER_SITE_OTHER): Payer: Medicare Other

## 2017-01-21 DIAGNOSIS — I5022 Chronic systolic (congestive) heart failure: Secondary | ICD-10-CM

## 2017-01-21 DIAGNOSIS — Z95810 Presence of automatic (implantable) cardiac defibrillator: Secondary | ICD-10-CM

## 2017-01-21 NOTE — Telephone Encounter (Signed)
Remote transmission from today reviewed. Presenting rhythm: AsBp. No episodes recorded. Stable lead measurements. Device functioning appropriately.  I informed patient that the stimulator that she used on her back yesterday did not harm her device at all. I informed her that the device is functioning just as it's supposed to. Patient states that the ortho nurse from yesterday contacted the regional representative for the stimulator and was told that it would not be harmful as long as it remained 12 inches away from the defib. I explained to patient that this information is accurate. I encouraged patient to call if she had any further questions. Patient voiced understanding.

## 2017-01-24 NOTE — Progress Notes (Signed)
EPIC Encounter for ICM Monitoring  Patient Name: Carol Warren is a 71 y.o. female Date: 01/24/2017 Primary Care Physican: Ernestene Kiel, MD Primary Cardiologist:Hochrein Electrophysiologist: Lovena Le Dry Weight:unknown Bi-V Pacing: 100%      Transmission reviewed  Thoracic impedance returned to normal after 3 days of increased furosemide.    Follow-up plan: ICM clinic phone appointment on 03/10/2017.  Office appointment for defib check with Dr Lovena Le on 02/07/2017  Copy of ICM check sent to device physician.   3 month ICM trend: 01/21/2017   1 Year ICM trend:      Rosalene Billings, RN 01/24/2017 8:47 AM

## 2017-01-27 ENCOUNTER — Telehealth (INDEPENDENT_AMBULATORY_CARE_PROVIDER_SITE_OTHER): Payer: Self-pay | Admitting: *Deleted

## 2017-01-27 NOTE — Telephone Encounter (Signed)
Carol Warren needs medical necessity form signed for pt, will stop in this afternoon

## 2017-01-28 NOTE — Telephone Encounter (Signed)
Form has been sent back to [place]

## 2017-01-30 ENCOUNTER — Encounter (INDEPENDENT_AMBULATORY_CARE_PROVIDER_SITE_OTHER): Payer: Self-pay | Admitting: Specialist

## 2017-01-30 ENCOUNTER — Ambulatory Visit (INDEPENDENT_AMBULATORY_CARE_PROVIDER_SITE_OTHER): Payer: Medicare Other

## 2017-01-30 ENCOUNTER — Ambulatory Visit (INDEPENDENT_AMBULATORY_CARE_PROVIDER_SITE_OTHER): Payer: Medicare Other | Admitting: Specialist

## 2017-01-30 VITALS — BP 142/86 | HR 106 | Ht 61.0 in | Wt 115.0 lb

## 2017-01-30 DIAGNOSIS — M4316 Spondylolisthesis, lumbar region: Secondary | ICD-10-CM | POA: Diagnosis not present

## 2017-01-30 DIAGNOSIS — M5136 Other intervertebral disc degeneration, lumbar region: Secondary | ICD-10-CM | POA: Diagnosis not present

## 2017-01-30 DIAGNOSIS — Z720 Tobacco use: Secondary | ICD-10-CM | POA: Diagnosis not present

## 2017-01-30 NOTE — Progress Notes (Signed)
Office Visit Note   Patient: Carol Warren           Date of Birth: 06-16-1946           MRN: [id number] Visit Date: 01/30/2017              Requested by: Ernestene Kiel, MD [address] [place], [place] [address] PCP: Ernestene Kiel, MD   Assessment & Plan: Visit Diagnoses:  1. DDD (degenerative disc disease), lumbar   2. Spondylolisthesis of lumbar region   3. Nicotine abuse     Plan: Ms. Bassett is 5 months out from Lumbar decompression and fusion L3-4 & L4-5 Transforaminal lumbar interbody fusion with cages pedicle screws and rods L3 to L5.  She states that she is not doing well with this.  She states that she has had bone stimulator for 2 weeks now.      Follow-Up Instructions: Return in about 5 weeks (around 03/06/2017).   Orders:  Orders Placed This Encounter  Procedures  . XR Lumbar Spine 2-3 Views   No orders of the defined types were placed in this encounter.     Procedures: No procedures performed   Clinical Data: No additional findings.   Subjective: Chief Complaint  Patient presents with  . Lower Back - Follow-up    Ms. Bucklew is 5 months out from Lumbar decompression and fusion L3-4 & L4-5 Transforaminal lumbar interbody fusion with cages pedicle screws and rods L3 to L5.  She states that she is not doing well with this.  She states that she has had bone stimulator for 2 weeks now.      Review of Systems   Objective: Vital Signs: BP (!) 142/86 (BP Location: Left Arm, Patient Position: Sitting)   Pulse (!) 106   Ht '5\' 1"'$  (1.549 m)   Wt 115 lb (52.2 kg)   BMI 21.73 kg/m   Physical Exam  Ortho Exam  Specialty Comments:  No specialty comments available.  Imaging: No results found.   PMFS History: Patient Active Problem List   Diagnosis Date Noted  . Spondylolisthesis of lumbar region 08/30/2016    Priority: High    Class: Chronic  . DDD (degenerative disc disease), lumbar 08/30/2016    Priority: High  . Spinal stenosis, lumbar  region, with neurogenic claudication 08/30/2016    Priority: High    Class: Chronic  . Mucoid cyst of joint 11/20/2016  . Osteoarthritis of finger of right hand 11/20/2016  . Lumbar degenerative disc disease 08/30/2016  . Dizziness 06/12/2015  . ICD (implantable cardioverter-defibrillator), biventricular, in situ 02/16/2015  . Chronic systolic heart failure ([place]) 02/16/2015  . Nonischemic cardiomyopathy ([place]) 01/19/2015  . Anemia, secondary to surgery 01/18/2015  . B12 deficiency 01/18/2015  . CHB (complete heart block)- post op 01/18/2015  . 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 Severe aortic stenosis 11/14/2014  . Pulmonary hypertension due to left ventricular systolic dysfunction 66/59/9357  . Personal history of colonic polyps 07/30/2013  . COPD GOLD 0/ still smoking  07/07/2013  . CIS (carcinoma in situ)   . Osteopenia   . HSV-1 (herpes simplex virus 1) infection   . HSV-2 (herpes simplex virus 2) infection   . Bicuspid aortic valve   . Aortic aneurysm ([place])   . Adrenal tumor   . Degenerative disc disease   . Smoker    Past Medical History:  Diagnosis Date  . Adrenal tumor   . Anxiety   .  Aortic aneurysm ([place])   . Asthma   . Bicuspid aortic valve    CONGENITAL  . Cancer ([place])    skin   . Carpal tunnel syndrome, bilateral   . Chronic depression   . Chronic fatigue   . Chronic sinusitis   . 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 COPD (chronic obstructive pulmonary disease) ([place])   . Degenerative disc disease    CERVICAL AND LUMBAR (DR. Lorin Mercy)  . GERD (gastroesophageal reflux disease)   . Hammer toe of right foot   . Headache   . Heart murmur   . Hepatomegaly   . HSV-1 (herpes simplex virus 1) infection   . HSV-2 (herpes simplex virus 2) infection   . Insomnia   . Lumbar stenosis   . Osteoarthritis   . Osteopenia   . Pernicious anemia   . Plantar fasciitis   . Pneumonia   . PONV (postoperative  nausea and vomiting)   . Shortness of breath dyspnea   . Smoker   . Venous insufficiency of left leg   . Vitamin B 12 deficiency     Family History  Problem Relation Age of Onset  . Pancreatic cancer Mother 95  . Diabetes Father   . Hypertension Father   . Heart disease Father 52    CAD  . Breast cancer Sister   . Diabetes Maternal Grandmother   . Hypertension Maternal Grandmother   . Cancer Maternal Grandfather     colon or stomach  . Hypertension Paternal Grandmother   . Heart disease Paternal Grandmother     Later onset  . Diverticulosis Paternal Grandmother   . Asthma Grandchild     Past Surgical History:  Procedure Laterality Date  . AORTIC VALVE REPLACEMENT N/A 01/12/2015   Procedure: AORTIC VALVE REPLACEMENT (AVR);  Surgeon: Gaye Pollack, MD;  Location: [place];  Service: Open Heart Surgery;  Laterality: N/A;  . ASCENDING AORTIC ROOT REPLACEMENT N/A 01/12/2015   Procedure: ASCENDING AORTIC ROOT REPLACEMENT;  Surgeon: Gaye Pollack, MD;  Location: [place];  Service: Open Heart Surgery;  Laterality: N/A;  . BI-VENTRICULAR PACEMAKER INSERTION N/A 01/18/2015   Procedure: BI-VENTRICULAR PACEMAKER INSERTION (CRT-P);  Surgeon: Evans Lance, MD;  Location: [facility] CATH LAB;  Service: Cardiovascular;  Laterality: N/A;  . BUNIONECTOMY WITH HAMMERTOE RECONSTRUCTION Right   . CARPAL TUNNEL RELEASE Bilateral 2008  . CATARACT EXTRACTION Bilateral    X2  . CATARACT EXTRACTION W/ INTRAOCULAR LENS  IMPLANT, BILATERAL    . COLONOSCOPY    . CYST EXCISION     right side of throat  . EXCISION OF BASAL CELL CA     SKIN   . EXCISION OF VULVAR CIS    . EYE SURGERY    . FUNCTIONAL ENDOSCOPIC SINUS SURGERY    . LEFT AND RIGHT HEART CATHETERIZATION WITH CORONARY ANGIOGRAM N/A 09/23/2014   Procedure: LEFT AND RIGHT HEART CATHETERIZATION WITH CORONARY ANGIOGRAM;  Surgeon: Minus Breeding, MD;  Location: [facility] CATH LAB;  Service: Cardiovascular;  Laterality: N/A;  . MULTIPLE TOOTH EXTRACTIONS    . SINUS  PROCEDURE  2009   x 6  . TEE WITHOUT CARDIOVERSION N/A 10/06/2014   Procedure: TRANSESOPHAGEAL ECHOCARDIOGRAM (TEE);  Surgeon: Sueanne Margarita, MD;  Location: [facility] ENDOSCOPY;  Service: Cardiovascular;  Laterality: N/A;  . TEE WITHOUT CARDIOVERSION N/A 01/12/2015   Procedure: TRANSESOPHAGEAL ECHOCARDIOGRAM (TEE);  Surgeon: Gaye Pollack, MD;  Location: [place];  Service: Open Heart Surgery;  Laterality: N/A;  .  UPPER GASTROINTESTINAL ENDOSCOPY     Social History   Occupational History  . retired Retired   Social History Main Topics  . Smoking status: Current Every Day Smoker    Packs/day: 1.00    Years: 50.00    Types: Cigarettes  . Smokeless tobacco: Never Used     Comment: uses vapor cig  . Alcohol use No  . Drug use: No  . Sexual activity: Not on file

## 2017-01-30 NOTE — Progress Notes (Signed)
Post-Op Visit Note   Patient: Carol Warren           Date of Birth: [birth year]           MRN: [id number] Visit Date: 01/30/2017 PCP: Ernestene Kiel, MD   Assessment & Plan:  Chief Complaint:  Chief Complaint  Patient presents with  . Lower Back - Follow-up  Patient returns status post L3-4 and L4-5 fusion. Has been using the external bone stimulator for a couple weeks. Says that she is being compliant with using her brace. Continues to complain of back pain. Also still smoking at least 1 pack per day. Visit Diagnoses:  1. DDD (degenerative disc disease), lumbar   2. Spondylolisthesis of lumbar region   3. Nicotine abuse     Plan: Had long talk with patient regarding smoking cessation. Also discussed the effects of smoking on bone healing she voices understanding after discussing the risk of pseudoarthrosis. Continue using bone stimulator and brace. Follow Dr. Louanne Skye in 5 weeks for recheck with repeat x-ray. Returns sooner needed. Patient asked about narcotic medication and none was given today.  Follow-Up Instructions: Return in about 5 weeks (around 03/06/2017).   Orders:  Orders Placed This Encounter  Procedures  . XR Lumbar Spine 2-3 Views   No orders of the defined types were placed in this encounter.   Imaging: Xr Lumbar Spine 2-3 Views  Result Date: 01/30/2017 X-rays lumbar spine AP lateral views show that her hardware is intact. Slow healing at the graft sites.  No changes from previous x-rays.   PMFS History: Patient Active Problem List   Diagnosis Date Noted  . Mucoid cyst of joint 11/20/2016  . Osteoarthritis of finger of right hand 11/20/2016  . Spondylolisthesis of lumbar region 08/30/2016    Class: Chronic  . DDD (degenerative disc disease), lumbar 08/30/2016  . Spinal stenosis, lumbar region, with neurogenic claudication 08/30/2016    Class: Chronic  . Lumbar degenerative disc disease 08/30/2016  . Dizziness 06/12/2015  . ICD (implantable  cardioverter-defibrillator), biventricular, in situ 02/16/2015  . Chronic systolic heart failure ([place]) 02/16/2015  . Nonischemic cardiomyopathy ([place]) 01/19/2015  . Anemia, secondary to surgery 01/18/2015  . B12 deficiency 01/18/2015  . CHB (complete heart block)- post op 01/18/2015  . 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 Severe aortic stenosis 11/14/2014  . Pulmonary hypertension due to left ventricular systolic dysfunction 61/95/0932  . Personal history of colonic polyps 07/30/2013  . COPD GOLD 0/ still smoking  07/07/2013  . CIS (carcinoma in situ)   . Osteopenia   . HSV-1 (herpes simplex virus 1) infection   . HSV-2 (herpes simplex virus 2) infection   . Bicuspid aortic valve   . Aortic aneurysm ([place])   . Adrenal tumor   . Degenerative disc disease   . Smoker    Past Medical History:  Diagnosis Date  . Adrenal tumor   . Anxiety   . Aortic aneurysm ([place])   . Asthma   . Bicuspid aortic valve    CONGENITAL  . Cancer ([place])    skin   . Carpal tunnel syndrome, bilateral   . Chronic depression   . Chronic fatigue   . Chronic sinusitis   . 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 COPD (chronic obstructive pulmonary disease) ([place])   . Degenerative disc disease    CERVICAL AND LUMBAR (DR. Lorin Mercy)  . GERD (gastroesophageal reflux disease)   . Hammer toe of  right foot   . Headache   . Heart murmur   . Hepatomegaly   . HSV-1 (herpes simplex virus 1) infection   . HSV-2 (herpes simplex virus 2) infection   . Insomnia   . Lumbar stenosis   . Osteoarthritis   . Osteopenia   . Pernicious anemia   . Plantar fasciitis   . Pneumonia   . PONV (postoperative nausea and vomiting)   . Shortness of breath dyspnea   . Smoker   . Venous insufficiency of left leg   . Vitamin B 12 deficiency     Family History  Problem Relation Age of Onset  . Pancreatic cancer Mother 36  . Diabetes Father   . Hypertension Father   . Heart  disease Father 15    CAD  . Breast cancer Sister   . Diabetes Maternal Grandmother   . Hypertension Maternal Grandmother   . Cancer Maternal Grandfather     colon or stomach  . Hypertension Paternal Grandmother   . Heart disease Paternal Grandmother     Later onset  . Diverticulosis Paternal Grandmother   . Asthma Grandchild     Past Surgical History:  Procedure Laterality Date  . AORTIC VALVE REPLACEMENT N/A 01/12/2015   Procedure: AORTIC VALVE REPLACEMENT (AVR);  Surgeon: Gaye Pollack, MD;  Location: [place];  Service: Open Heart Surgery;  Laterality: N/A;  . ASCENDING AORTIC ROOT REPLACEMENT N/A 01/12/2015   Procedure: ASCENDING AORTIC ROOT REPLACEMENT;  Surgeon: Gaye Pollack, MD;  Location: [place];  Service: Open Heart Surgery;  Laterality: N/A;  . BI-VENTRICULAR PACEMAKER INSERTION N/A 01/18/2015   Procedure: BI-VENTRICULAR PACEMAKER INSERTION (CRT-P);  Surgeon: Evans Lance, MD;  Location: [facility] CATH LAB;  Service: Cardiovascular;  Laterality: N/A;  . BUNIONECTOMY WITH HAMMERTOE RECONSTRUCTION Right   . CARPAL TUNNEL RELEASE Bilateral 2008  . CATARACT EXTRACTION Bilateral    X2  . CATARACT EXTRACTION W/ INTRAOCULAR LENS  IMPLANT, BILATERAL    . COLONOSCOPY    . CYST EXCISION     right side of throat  . EXCISION OF BASAL CELL CA     SKIN   . EXCISION OF VULVAR CIS    . EYE SURGERY    . FUNCTIONAL ENDOSCOPIC SINUS SURGERY    . LEFT AND RIGHT HEART CATHETERIZATION WITH CORONARY ANGIOGRAM N/A 09/23/2014   Procedure: LEFT AND RIGHT HEART CATHETERIZATION WITH CORONARY ANGIOGRAM;  Surgeon: Minus Breeding, MD;  Location: [facility] CATH LAB;  Service: Cardiovascular;  Laterality: N/A;  . MULTIPLE TOOTH EXTRACTIONS    . SINUS PROCEDURE  2009   x 6  . TEE WITHOUT CARDIOVERSION N/A 10/06/2014   Procedure: TRANSESOPHAGEAL ECHOCARDIOGRAM (TEE);  Surgeon: Sueanne Margarita, MD;  Location: [facility] ENDOSCOPY;  Service: Cardiovascular;  Laterality: N/A;  . TEE WITHOUT CARDIOVERSION N/A 01/12/2015    Procedure: TRANSESOPHAGEAL ECHOCARDIOGRAM (TEE);  Surgeon: Gaye Pollack, MD;  Location: [place];  Service: Open Heart Surgery;  Laterality: N/A;  . UPPER GASTROINTESTINAL ENDOSCOPY     Social History   Occupational History  . retired Retired   Social History Main Topics  . Smoking status: Current Every Day Smoker    Packs/day: 1.00    Years: 50.00    Types: Cigarettes  . Smokeless tobacco: Never Used     Comment: uses vapor cig  . Alcohol use No  . Drug use: No  . Sexual activity: Not on file

## 2017-01-31 ENCOUNTER — Other Ambulatory Visit (INDEPENDENT_AMBULATORY_CARE_PROVIDER_SITE_OTHER): Payer: Self-pay | Admitting: Specialist

## 2017-01-31 ENCOUNTER — Telehealth (INDEPENDENT_AMBULATORY_CARE_PROVIDER_SITE_OTHER): Payer: Self-pay | Admitting: Specialist

## 2017-01-31 DIAGNOSIS — M5136 Other intervertebral disc degeneration, lumbar region: Secondary | ICD-10-CM

## 2017-01-31 DIAGNOSIS — M4316 Spondylolisthesis, lumbar region: Secondary | ICD-10-CM

## 2017-01-31 MED ORDER — HYDROCODONE-ACETAMINOPHEN 5-325 MG PO TABS: 1.0000 | ORAL_TABLET | Freq: Four times a day (QID) | ORAL | 0 refills | Status: DC | PRN

## 2017-01-31 NOTE — Telephone Encounter (Signed)
PATIENT CALLED NEEDING A REFILL ON HYDROCODONE. ONLY HAS ENOUGH TO LAST HER TIL TOMORROW. ALSO IN A LOT OF PAIN AND NEEDING ADVICE ON HOW TO EASE THE PAIN IN HER BACK.

## 2017-01-31 NOTE — Telephone Encounter (Signed)
Rx for hydrocodone #30 approved, printed and signed. jen She will need to pick this up.

## 2017-01-31 NOTE — Telephone Encounter (Signed)
PATIENT CALLED NEEDING A REFILL ON HYDROCODONE. ONLY HAS ENOUGH TO LAST HER TIL TOMORROW. ALSO IN A LOT OF PAIN AND NEEDING ADVICE ON HOW TO EASE THE PAIN IN HER BACK. CB # [phone]

## 2017-02-03 NOTE — Telephone Encounter (Signed)
Carol Warren is aware rx is ready for pick up

## 2017-02-04 NOTE — Progress Notes (Deleted)
Office Visit Note   Patient: Carol Warren           Date of Birth: 03/26/1946           MRN: [id number] Visit Date: 01/30/2017              Requested by: Ernestene Kiel, MD [address] [place] [place], [place] [address] PCP: Ernestene Kiel, MD   Assessment & Plan: Visit Diagnoses:  1. DDD (degenerative disc disease), lumbar   2. Spondylolisthesis of lumbar region   3. Nicotine abuse     Plan: ***  Follow-Up Instructions: Return in about 5 weeks (around 03/06/2017).   Orders:  Orders Placed This Encounter  Procedures  . XR Lumbar Spine 2-3 Views   No orders of the defined types were placed in this encounter.     Procedures: No procedures performed   Clinical Data: No additional findings.   Subjective: Chief Complaint  Patient presents with  . Lower Back - Follow-up    Carol Warren is 5 months out from Lumbar decompression and fusion L3-4 & L4-5 Transforaminal lumbar interbody fusion with cages pedicle screws and rods L3 to L5.  She states that she is not doing well with this.  She states that she has had bone stimulator for 2 weeks now.      Review of Systems   Objective: Vital Signs: BP (!) 142/86 (BP Location: Left Arm, Patient Position: Sitting)   Pulse (!) 106   Ht '5\' 1"'$  (1.549 m)   Wt 115 lb (52.2 kg)   BMI 21.73 kg/m   Physical Exam  Ortho Exam  Specialty Comments:  No specialty comments available.  Imaging: No results found.   PMFS History: Patient Active Problem List   Diagnosis Date Noted  . Spondylolisthesis of lumbar region 08/30/2016    Priority: High    Class: Chronic  . DDD (degenerative disc disease), lumbar 08/30/2016    Priority: High  . Spinal stenosis, lumbar region, with neurogenic claudication 08/30/2016    Priority: High    Class: Chronic  . Mucoid cyst of joint 11/20/2016  . Osteoarthritis of finger of right hand 11/20/2016  . Lumbar degenerative disc disease 08/30/2016  . Dizziness 06/12/2015  . ICD (implantable  cardioverter-defibrillator), biventricular, in situ 02/16/2015  . Chronic systolic heart failure ([place]) 02/16/2015  . Nonischemic cardiomyopathy ([place]) 01/19/2015  . Anemia, secondary to surgery 01/18/2015  . B12 deficiency 01/18/2015  . CHB (complete heart block)- post op 01/18/2015  . 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 Severe aortic stenosis 11/14/2014  . Pulmonary hypertension due to left ventricular systolic dysfunction 62/56/3893  . Personal history of colonic polyps 07/30/2013  . COPD GOLD 0/ still smoking  07/07/2013  . CIS (carcinoma in situ)   . Osteopenia   . HSV-1 (herpes simplex virus 1) infection   . HSV-2 (herpes simplex virus 2) infection   . Bicuspid aortic valve   . Aortic aneurysm ([place])   . Adrenal tumor   . Degenerative disc disease   . Smoker    Past Medical History:  Diagnosis Date  . Adrenal tumor   . Anxiety   . Aortic aneurysm ([place])   . Asthma   . Bicuspid aortic valve    CONGENITAL  . Cancer ([place])    skin   . Carpal tunnel syndrome, bilateral   . Chronic depression   . Chronic fatigue   . Chronic sinusitis   . CIS (carcinoma in  situ) 04/1997   VULVAR  . COPD (chronic obstructive pulmonary disease) ([place])   . Degenerative disc disease    CERVICAL AND LUMBAR (DR. Lorin Mercy)  . GERD (gastroesophageal reflux disease)   . Hammer toe of right foot   . Headache   . Heart murmur   . Hepatomegaly   . HSV-1 (herpes simplex virus 1) infection   . HSV-2 (herpes simplex virus 2) infection   . Insomnia   . Lumbar stenosis   . Osteoarthritis   . Osteopenia   . Pernicious anemia   . Plantar fasciitis   . Pneumonia   . PONV (postoperative nausea and vomiting)   . Shortness of breath dyspnea   . Smoker   . Venous insufficiency of left leg   . Vitamin B 12 deficiency     Family History  Problem Relation Age of Onset  . Pancreatic cancer Mother 63  . Diabetes Father   . Hypertension Father   . Heart  disease Father 62    CAD  . Breast cancer Sister   . Diabetes Maternal Grandmother   . Hypertension Maternal Grandmother   . Cancer Maternal Grandfather     colon or stomach  . Hypertension Paternal Grandmother   . Heart disease Paternal Grandmother     Later onset  . Diverticulosis Paternal Grandmother   . Asthma Grandchild     Past Surgical History:  Procedure Laterality Date  . AORTIC VALVE REPLACEMENT N/A 01/12/2015   Procedure: AORTIC VALVE REPLACEMENT (AVR);  Surgeon: Gaye Pollack, MD;  Location: [place];  Service: Open Heart Surgery;  Laterality: N/A;  . ASCENDING AORTIC ROOT REPLACEMENT N/A 01/12/2015   Procedure: ASCENDING AORTIC ROOT REPLACEMENT;  Surgeon: Gaye Pollack, MD;  Location: [place];  Service: Open Heart Surgery;  Laterality: N/A;  . BI-VENTRICULAR PACEMAKER INSERTION N/A 01/18/2015   Procedure: BI-VENTRICULAR PACEMAKER INSERTION (CRT-P);  Surgeon: Evans Lance, MD;  Location[facility] CATH LAB;  Service: Cardiovascular;  Laterality: N/A;  . BUNIONECTOMY WITH HAMMERTOE RECONSTRUCTION Right   . CARPAL TUNNEL RELEASE Bilateral 2008  . CATARACT EXTRACTION Bilateral    X2  . CATARACT EXTRACTION W/ INTRAOCULAR LENS  IMPLANT, BILATERAL    . COLONOSCOPY    . CYST EXCISION     right side of throat  . EXCISION OF BASAL CELL CA     SKIN   . EXCISION OF VULVAR CIS    . EYE SURGERY    . FUNCTIONAL ENDOSCOPIC SINUS SURGERY    . LEFT AND RIGHT HEART CATHETERIZATION WITH CORONARY ANGIOGRAM N/A 09/23/2014   Procedure: LEFT AND RIGHT HEART CATHETERIZATION WITH CORONARY ANGIOGRAM;  Surgeon: Minus Breeding, MD;  Location: [facility] CATH LAB;  Service: Cardiovascular;  Laterality: N/A;  . MULTIPLE TOOTH EXTRACTIONS    . SINUS PROCEDURE  2009   x 6  . TEE WITHOUT CARDIOVERSION N/A 10/06/2014   Procedure: TRANSESOPHAGEAL ECHOCARDIOGRAM (TEE);  Surgeon: Sueanne Margarita, MD;  Location: [facility] ENDOSCOPY;  Service: Cardiovascular;  Laterality: N/A;  . TEE WITHOUT CARDIOVERSION N/A 01/12/2015    Procedure: TRANSESOPHAGEAL ECHOCARDIOGRAM (TEE);  Surgeon: Gaye Pollack, MD;  Location: [place];  Service: Open Heart Surgery;  Laterality: N/A;  . UPPER GASTROINTESTINAL ENDOSCOPY     Social History   Occupational History  . retired Retired   Social History Main Topics  . Smoking status: Current Every Day Smoker    Packs/day: 1.00    Years: 50.00    Types: Cigarettes  . Smokeless tobacco: Never Used  Comment: uses vapor cig  . Alcohol use No  . Drug use: No  . Sexual activity: Not on file

## 2017-02-06 ENCOUNTER — Encounter (INDEPENDENT_AMBULATORY_CARE_PROVIDER_SITE_OTHER): Payer: Self-pay | Admitting: Specialist

## 2017-02-06 NOTE — Telephone Encounter (Signed)
Placed in this to Dr. Louanne Skye

## 2017-02-07 ENCOUNTER — Encounter: Payer: Self-pay | Admitting: Internal Medicine

## 2017-02-07 ENCOUNTER — Ambulatory Visit (INDEPENDENT_AMBULATORY_CARE_PROVIDER_SITE_OTHER): Payer: Medicare Other | Admitting: Internal Medicine

## 2017-02-07 VITALS — BP 124/74 | HR 106 | Ht 61.0 in | Wt 120.0 lb

## 2017-02-07 DIAGNOSIS — Z95810 Presence of automatic (implantable) cardiac defibrillator: Secondary | ICD-10-CM | POA: Diagnosis not present

## 2017-02-07 DIAGNOSIS — I5022 Chronic systolic (congestive) heart failure: Secondary | ICD-10-CM | POA: Diagnosis not present

## 2017-02-07 LAB — CUP PACEART INCLINIC DEVICE CHECK
Battery Remaining Longevity: 48 mo
Battery Voltage: 2.96 V
Brady Statistic AP VS Percent: 0.01 %
Brady Statistic AS VS Percent: 0.12 %
Brady Statistic RA Percent Paced: 0.11 %
Date Time Interrogation Session: 20180209170853
HighPow Impedance: 69 Ohm
Implantable Lead Implant Date: 20160120
Implantable Lead Implant Date: 20160120
Implantable Lead Model: 4598
Implantable Lead Model: 6935
Implantable Pulse Generator Implant Date: 20160120
Lead Channel Impedance Value: 399 Ohm
Lead Channel Impedance Value: 475 Ohm
Lead Channel Impedance Value: 551 Ohm
Lead Channel Impedance Value: 589 Ohm
Lead Channel Impedance Value: 665 Ohm
Lead Channel Impedance Value: 665 Ohm
Lead Channel Impedance Value: 703 Ohm
Lead Channel Impedance Value: 874 Ohm
Lead Channel Pacing Threshold Pulse Width: 0.4 ms
Lead Channel Sensing Intrinsic Amplitude: 1.625 mV
Lead Channel Sensing Intrinsic Amplitude: 12.5 mV
Lead Channel Sensing Intrinsic Amplitude: 27 mV
Lead Channel Setting Pacing Amplitude: 1.5 V
Lead Channel Setting Pacing Amplitude: 2 V
Lead Channel Setting Pacing Amplitude: 2.75 V
Lead Channel Setting Pacing Pulse Width: 0.4 ms
Lead Channel Setting Pacing Pulse Width: 0.4 ms
Lead Channel Setting Sensing Sensitivity: 0.45 mV
MDC IDC LEAD IMPLANT DT: 20160120
MDC IDC LEAD LOCATION: 753858
MDC IDC LEAD LOCATION: 753859
MDC IDC LEAD LOCATION: 753860
MDC IDC MSMT LEADCHNL LV IMPEDANCE VALUE: 418 Ohm
MDC IDC MSMT LEADCHNL LV IMPEDANCE VALUE: 456 Ohm
MDC IDC MSMT LEADCHNL LV IMPEDANCE VALUE: 874 Ohm
MDC IDC MSMT LEADCHNL LV IMPEDANCE VALUE: 893 Ohm
MDC IDC MSMT LEADCHNL LV PACING THRESHOLD AMPLITUDE: 1.75 V
MDC IDC MSMT LEADCHNL LV PACING THRESHOLD PULSEWIDTH: 0.4 ms
MDC IDC MSMT LEADCHNL RA IMPEDANCE VALUE: 418 Ohm
MDC IDC MSMT LEADCHNL RA PACING THRESHOLD AMPLITUDE: 0.5 V
MDC IDC MSMT LEADCHNL RA SENSING INTR AMPL: 0.75 mV
MDC IDC MSMT LEADCHNL RV PACING THRESHOLD AMPLITUDE: 0.625 V
MDC IDC MSMT LEADCHNL RV PACING THRESHOLD PULSEWIDTH: 0.4 ms
MDC IDC STAT BRADY AP VP PERCENT: 0.1 %
MDC IDC STAT BRADY AS VP PERCENT: 99.77 %
MDC IDC STAT BRADY RV PERCENT PACED: 99.8 %

## 2017-02-07 NOTE — Patient Instructions (Addendum)
Medication Instructions:  Your physician recommends that you continue on your current medications as directed. Please refer to the Current Medication list given to you today.   Labwork: None Ordered   Testing/Procedures: None Ordered   Follow-Up: Your physician wants you to follow-up in: 1 year with Dr. Lovena Le. You will receive a reminder letter in the mail two months in advance. If you don't receive a letter, please call our office to schedule the follow-up appointment.  Remote monitoring is used to monitor your ICD from home. This monitoring reduces the number of office visits required to check your device to one time per year. It allows [place] to keep an eye on the functioning of your device to ensure it is working properly. You are scheduled for a device check from home on 05/08/17. You may send your transmission at any time that day. If you have a wireless device, the transmission will be sent automatically. After your physician reviews your transmission, you will receive a postcard with your next transmission date.     Any Other Special Instructions Will Be Listed Below (If Applicable).     If you need a refill on your cardiac medications before your next appointment, please call your pharmacy.

## 2017-02-07 NOTE — Progress Notes (Signed)
HPI Carol Warren returns today for followup. She is a pleasant 71 yo woman with a h/o chronic systolic heart failure and complete heart block who is s/p BiV ICD insertion. She feels well but has not been allowed back into cardiac rehab. No other complaints today.  No Known Allergies   Current Outpatient Prescriptions  Medication Sig Dispense Refill  . albuterol (PROVENTIL HFA;VENTOLIN HFA) 108 (90 BASE) MCG/ACT inhaler Inhale 1-2 puffs into the lungs every 6 (six) hours as needed for wheezing or shortness of breath.    [name] albuterol (PROVENTIL) (2.5 MG/3ML) 0.083% nebulizer solution Take 2.5 mg by nebulization 2 (two) times daily as needed for wheezing.    . Artificial Tear Solution (GENTEAL TEARS OP) Apply 1 drop to eye 3 (three) times daily as needed (dry eyes).    [name] aspirin EC 325 MG EC tablet Take 1 tablet (325 mg total) by mouth daily. 30 tablet 0  . B Complex Vitamins (VITAMIN B COMPLEX PO) Take 1 tablet by mouth daily.     . Calcium Carbonate-Vitamin D (CALCIUM-D PO) Take 1 tablet by mouth daily.     . carvedilol (COREG) 12.5 MG tablet Take 1 tablet (12.5 mg total) by mouth 2 (two) times daily with a meal. 60 tablet 9  . cyanocobalamin (,VITAMIN B-12,) 1000 MCG/ML injection Inject 1,000 mcg into the muscle once a week.     . diazepam (VALIUM) 10 MG tablet Take 10 mg by mouth 3 (three) times daily as needed for anxiety.    [name] FLUoxetine (PROZAC) 20 MG capsule Take 20 mg by mouth daily.    . fluticasone (FLONASE) 50 MCG/ACT nasal spray Place 1 spray into the nose 2 (two) times daily.     . furosemide (LASIX) 40 MG tablet Take 40 mg by mouth daily.    . Halobetasol-Ammonium Lactate 0.05 & 12 % (CREAM) KIT Apply 1 application topically daily as needed (dryness).    [name] ibuprofen (ADVIL,MOTRIN) 200 MG tablet Take 200 mg by mouth daily as needed for headache.    . loratadine (CLARITIN) 10 MG tablet Take 10 mg by mouth daily.    [name] losartan (COZAAR) 50 MG tablet Take 50 mg by mouth daily.      . meloxicam (MOBIC) 15 MG tablet Take 15 mg by mouth daily as needed for pain.    . methocarbamol (ROBAXIN) 500 MG tablet Take 1 tablet (500 mg total) by mouth every 6 (six) hours as needed for muscle spasms. 40 tablet 1  . montelukast (SINGULAIR) 10 MG tablet Take 10 mg by mouth at bedtime.    . Multiple Vitamin (MULTIVITAMIN) capsule Take 1 capsule by mouth daily.    . Olopatadine HCl (PATANASE) 0.6 % SOLN Place 2 drops into both nostrils 2 (two) times daily.     [name] omeprazole (PRILOSEC) 20 MG capsule Take 40 mg by mouth daily.    [name] oxyCODONE-acetaminophen (PERCOCET/ROXICET) 5-325 MG tablet Take 1-2 tablets by mouth every 4 (four) hours as needed for moderate pain. 90 tablet 0  . Probiotic Product (PROBIOTIC DAILY PO) Take 1 capsule by mouth daily. Ultimate flora    . pseudoephedrine-guaifenesin (MUCINEX D) 60-600 MG per tablet Take 1 tablet by mouth daily as needed for congestion.     . ranitidine (ZANTAC) 300 MG capsule Take 300 mg by mouth every evening.    . Sodium Chloride-Sodium Bicarb (CLASSIC NETI POT SINUS WASH NA) Place 2 sprays into the nose daily.  No current facility-administered medications for this visit.      Past Medical History:  Diagnosis Date  . Adrenal tumor   . Anxiety   . Aortic aneurysm ([place])   . Asthma   . Bicuspid aortic valve    CONGENITAL  . Cancer ([place])    skin   . Carpal tunnel syndrome, bilateral   . Chronic depression   . Chronic fatigue   . Chronic sinusitis   . 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 COPD (chronic obstructive pulmonary disease) ([place])   . Degenerative disc disease    CERVICAL AND LUMBAR (DR. Lorin Mercy)  . GERD (gastroesophageal reflux disease)   . Hammer toe of right foot   . Headache   . Heart murmur   . Hepatomegaly   . HSV-1 (herpes simplex virus 1) infection   . HSV-2 (herpes simplex virus 2) infection   . Insomnia   . Lumbar stenosis   . Osteoarthritis   . Osteopenia   . Pernicious anemia   . Plantar  fasciitis   . Pneumonia   . PONV (postoperative nausea and vomiting)   . Shortness of breath dyspnea   . Smoker   . Venous insufficiency of left leg   . Vitamin B 12 deficiency     ROS:   All systems reviewed and negative except as noted in the HPI.   Past Surgical History:  Procedure Laterality Date  . AORTIC VALVE REPLACEMENT N/A 01/12/2015   Procedure: AORTIC VALVE REPLACEMENT (AVR);  Surgeon: Gaye Pollack, MD;  Location: [place];  Service: Open Heart Surgery;  Laterality: N/A;  . ASCENDING AORTIC ROOT REPLACEMENT N/A 01/12/2015   Procedure: ASCENDING AORTIC ROOT REPLACEMENT;  Surgeon: Gaye Pollack, MD;  Location: [place];  Service: Open Heart Surgery;  Laterality: N/A;  . BI-VENTRICULAR PACEMAKER INSERTION N/A 01/18/2015   Procedure: BI-VENTRICULAR PACEMAKER INSERTION (CRT-P);  Surgeon: Evans Lance, MD;  Location: [facility] CATH LAB;  Service: Cardiovascular;  Laterality: N/A;  . BUNIONECTOMY WITH HAMMERTOE RECONSTRUCTION Right   . CARPAL TUNNEL RELEASE Bilateral 2008  . CATARACT EXTRACTION Bilateral    X2  . CATARACT EXTRACTION W/ INTRAOCULAR LENS  IMPLANT, BILATERAL    . COLONOSCOPY    . CYST EXCISION     right side of throat  . EXCISION OF BASAL CELL CA     SKIN   . EXCISION OF VULVAR CIS    . EYE SURGERY    . FUNCTIONAL ENDOSCOPIC SINUS SURGERY    . LEFT AND RIGHT HEART CATHETERIZATION WITH CORONARY ANGIOGRAM N/A 09/23/2014   Procedure: LEFT AND RIGHT HEART CATHETERIZATION WITH CORONARY ANGIOGRAM;  Surgeon: Minus Breeding, MD;  Location: [facility] CATH LAB;  Service: Cardiovascular;  Laterality: N/A;  . MULTIPLE TOOTH EXTRACTIONS    . SINUS PROCEDURE  2009   x 6  . TEE WITHOUT CARDIOVERSION N/A 10/06/2014   Procedure: TRANSESOPHAGEAL ECHOCARDIOGRAM (TEE);  Surgeon: Sueanne Margarita, MD;  Location: [facility] ENDOSCOPY;  Service: Cardiovascular;  Laterality: N/A;  . TEE WITHOUT CARDIOVERSION N/A 01/12/2015   Procedure: TRANSESOPHAGEAL ECHOCARDIOGRAM (TEE);  Surgeon: Gaye Pollack, MD;   Location: [place];  Service: Open Heart Surgery;  Laterality: N/A;  . UPPER GASTROINTESTINAL ENDOSCOPY       Family History  Problem Relation Age of Onset  . Pancreatic cancer Mother 61  . Diabetes Father   . Hypertension Father   . Heart disease Father 59    CAD  . Breast cancer Sister   .  Diabetes Maternal Grandmother   . Hypertension Maternal Grandmother   . Cancer Maternal Grandfather     colon or stomach  . Hypertension Paternal Grandmother   . Heart disease Paternal Grandmother     Later onset  . Diverticulosis Paternal Grandmother   . Asthma Grandchild      Social History   Social History  . Marital status: Married    Spouse name: N/A  . Number of children: 2  . Years of education: N/A   Occupational History  . retired Retired   Social History Main Topics  . Smoking status: Current Every Day Smoker    Packs/day: 1.00    Years: 50.00    Types: Cigarettes  . Smokeless tobacco: Never Used     Comment: uses vapor cig  . Alcohol use No  . Drug use: No  . Sexual activity: Not on file   Other Topics Concern  . Not on file   Social History Narrative  . No narrative on file     BP 124/74   Pulse (!) 106   Ht 5' 1"  (1.549 m)   Wt 120 lb (54.4 kg)   BMI 22.67 kg/m   Physical Exam:  Well appearing 71 yo woman, NAD HEENT: Unremarkable Neck:  6 cm JVD, no thyromegally Back:  No CVA tenderness Lungs:  Clear with no wheezes HEART:  Regular rate rhythm, no murmurs, no rubs, no clicks Abd:  soft, positive bowel sounds, no organomegally, no rebound, no guarding Ext:  2 plus pulses, no edema, no cyanosis, no clubbing Skin:  No rashes no nodules Neuro:  CN II through XII intact, motor grossly intact  DEVICE  Normal device function.  See PaceArt for details.   ECG - sinus tachy with biv pacing  Assess/Plan: 1. Chronic systolic heart failure - her symptoms are welll compensated. She will continue her current meds. She is encouraged to maintain a low  sodium diet.  2. ICD - her Medtronic BiV ICD is working normally. Will follow. 3. HTN - Her blood pressure is well controlled. No change in her meds.  Mikle [email or web address].D.

## 2017-02-13 ENCOUNTER — Telehealth (INDEPENDENT_AMBULATORY_CARE_PROVIDER_SITE_OTHER): Payer: Self-pay | Admitting: *Deleted

## 2017-02-13 NOTE — Telephone Encounter (Signed)
Patient called in this morning in regards to needing a prescription refill on her Hydrocodone please. Her CB # [phone]. Thank you

## 2017-02-13 NOTE — Telephone Encounter (Signed)
Patient called in this morning in regards to needing a prescription refill on her Hydrocodone please

## 2017-02-14 ENCOUNTER — Other Ambulatory Visit (INDEPENDENT_AMBULATORY_CARE_PROVIDER_SITE_OTHER): Payer: Self-pay | Admitting: Specialist

## 2017-02-14 MED ORDER — HYDROCODONE-ACETAMINOPHEN 5-325 MG PO TABS
1.0000 | ORAL_TABLET | Freq: Four times a day (QID) | ORAL | 0 refills | Status: DC | PRN
Start: 2017-02-14 — End: 2017-03-13

## 2017-02-14 NOTE — Progress Notes (Signed)
Patients aware her rx is ready for pick up i

## 2017-02-14 NOTE — Telephone Encounter (Signed)
Rx for hydrocodone #30 printed and signed. jen

## 2017-02-14 NOTE — Telephone Encounter (Signed)
Patients aware her rx is ready for pick up i

## 2017-02-25 ENCOUNTER — Telehealth: Payer: Self-pay | Admitting: Cardiology

## 2017-02-25 NOTE — Telephone Encounter (Signed)
FYI: Carol Warren is calling to let Dr. Percival Spanish know that her  PCP has ordered a CT Angiogram. She has been having trouble with the blood flow in her legs and they also want to look at her arteries.  They done a test(ABI)  last week that showed some significant problems and that is why she is having the CT Angiogram .

## 2017-02-26 NOTE — Telephone Encounter (Signed)
Noted  

## 2017-02-28 ENCOUNTER — Telehealth: Payer: Self-pay | Admitting: Cardiology

## 2017-02-28 NOTE — Telephone Encounter (Signed)
Returned call to patient she stated she gained 4 lbs over night.Stated she doubled lasix dose this morning.Advised she can double lasix dose for the next 3 days then return to normal dose.Stated she had a chest ct done yesterday at [facility], [facility] to read the test.She will make sure Dr.Hochrein gets a copy of report.Advised she is past due to see Dr.Hochrein.Appointment scheduled with him 03/13/17 at 12:00 noon.Advised to call sooner if needed.

## 2017-02-28 NOTE — Telephone Encounter (Signed)
New message    Pt c/o swelling: STAT is pt has developed SOB within 24 hours  1. How long have you been experiencing swelling? A couple days  2. Where is the swelling located? Legs  3.  Are you currently taking a "fluid pill"? Yes  4.  Are you currently SOB? Not any worse than usual  5.  Have you traveled recently? No  Pt is calling because she gained 4 pounds in a day. She said she took 2 lasix today when she had breakfast. She said she had a CT angio yesterday, could that have put the extra weight on her?

## 2017-03-03 ENCOUNTER — Telehealth (INDEPENDENT_AMBULATORY_CARE_PROVIDER_SITE_OTHER): Payer: Self-pay | Admitting: Specialist

## 2017-03-03 ENCOUNTER — Ambulatory Visit: Payer: Medicare Other | Admitting: Cardiology

## 2017-03-03 NOTE — Telephone Encounter (Signed)
Patient called needing Rx refilled Hydrocodone. The number to contact patient is [phone].

## 2017-03-03 NOTE — Telephone Encounter (Signed)
Can you please advise on this

## 2017-03-03 NOTE — Telephone Encounter (Signed)
Advised patient time to stop narcotic medication.

## 2017-03-04 NOTE — Telephone Encounter (Signed)
Please check the [place] DEA drug report.

## 2017-03-04 NOTE — Telephone Encounter (Signed)
Patient wants to know if can get something that is stronger than Advil or tylenol. Asked about tramadol?

## 2017-03-05 NOTE — Telephone Encounter (Signed)
Can you run this for mr please?

## 2017-03-05 NOTE — Telephone Encounter (Signed)
done

## 2017-03-05 NOTE — Telephone Encounter (Signed)
I have the DEA report on my desk.

## 2017-03-06 ENCOUNTER — Encounter: Payer: Self-pay | Admitting: Cardiology

## 2017-03-06 ENCOUNTER — Other Ambulatory Visit (INDEPENDENT_AMBULATORY_CARE_PROVIDER_SITE_OTHER): Payer: Self-pay | Admitting: Radiology

## 2017-03-06 MED ORDER — TRAMADOL HCL 50 MG PO TABS: 50.0000 mg | ORAL_TABLET | Freq: Two times a day (BID) | ORAL | 0 refills | Status: DC | PRN

## 2017-03-06 NOTE — Telephone Encounter (Signed)
Okay to give tramadol 50 mg 1 tab by mouth every 12 hours when necessary for pain #60 tablets no refills

## 2017-03-06 NOTE — Telephone Encounter (Signed)
I called and advised patient that I could call this in for and she agreed --- I called rx into [facility] for the patient.

## 2017-03-07 ENCOUNTER — Other Ambulatory Visit: Payer: Self-pay | Admitting: Orthopedic Surgery

## 2017-03-10 ENCOUNTER — Ambulatory Visit (INDEPENDENT_AMBULATORY_CARE_PROVIDER_SITE_OTHER): Payer: Medicare Other

## 2017-03-10 ENCOUNTER — Telehealth (INDEPENDENT_AMBULATORY_CARE_PROVIDER_SITE_OTHER): Payer: Self-pay | Admitting: Specialist

## 2017-03-10 DIAGNOSIS — Z95810 Presence of automatic (implantable) cardiac defibrillator: Secondary | ICD-10-CM

## 2017-03-10 DIAGNOSIS — I5022 Chronic systolic (congestive) heart failure: Secondary | ICD-10-CM | POA: Diagnosis not present

## 2017-03-10 NOTE — Telephone Encounter (Signed)
Patient called advised she was laying with her back to the nightstand reached over to pick the phone and fell off the bed. Patient said her back is hurting her. Patient asked if she can get something for pain. Patient said she is laying in the bed and will stay in bed most of the day. The number to contact her is [phone]

## 2017-03-10 NOTE — Progress Notes (Addendum)
EPIC Encounter for ICM Monitoring  Patient Name: Carol Warren is a 71 y.o. female Date: 03/10/2017 Primary Care Physican: PROCHNAU,CAROLINE, MD Primary [place] Electrophysiologist: Lovena Le Dry Weight:120 lbs Bi-V Pacing: 95.9%           Heart Failure questions reviewed, pt asymptomatic    Thoracic impedance normal   Prescribed and confirmed dosage: Furosemide 40 mg 1 tablet daily  Recommendations: No changes. Reminded to limit dietary salt intake to 2000 mg/day and fluid intake to < 2 liters/day. Encouraged to call for fluid symptoms.  Follow-up plan: ICM clinic phone appointment on 04/15/2017.  Copy of ICM check sent to device physician.   3 month ICM trend: 03/10/2017   1 Year ICM trend:      Rosalene Billings, RN 03/10/2017 10:24 AM

## 2017-03-10 NOTE — Telephone Encounter (Signed)
Patient called advised she was laying with her back to the nightstand reached over to pick the phone and fell off the bed. Patient said her back is hurting her. Patient asked if she can get something for pain. Patient said she is laying in the bed and will stay in bed most of the day. ----Jeneen Rinks gave her Tramadol on 03/06/2017----Please advise.

## 2017-03-12 ENCOUNTER — Telehealth (INDEPENDENT_AMBULATORY_CARE_PROVIDER_SITE_OTHER): Payer: Self-pay | Admitting: Specialist

## 2017-03-12 NOTE — Telephone Encounter (Signed)
I can not prescribe a controlled substance every time a patient rolls off a bed and hurts she will need an appointment and xrays to evaluate a need for narcotics, otherwise ice and tylenol. jen

## 2017-03-12 NOTE — Telephone Encounter (Signed)
Patient is asking for a refill on hydrocodone. She said the tramadol wasn't strong enough for her, and she still experiencing a lot of pain. CB # [phone]

## 2017-03-12 NOTE — Progress Notes (Signed)
Cardiology Office Note   Date:  03/13/2017   ID:  Carol Warren, DOB 1946-09-11, MRN [id number]  PCP:  Ernestene Kiel, MD  Cardiologist:   Minus Breeding, MD    No chief complaint on file.     History of Present Illness: Carol Warren is a 71 y.o. female who presents for evaluation after valve replacement.  She has chronic systolic and diastolic HF.  The patient denies any new symptoms such as chest discomfort, neck or arm discomfort. There has been no new shortness of breath, PND or orthopnea. There have been no reported palpitations, presyncope or syncope.  She is going to have a cyst from her finger removed.  She has had back fusion.  She did call and had some increased volume earlier this month.  However, she has been managed with a short course of increased diuretic and improved.  Her thoracic impedance and followed but is back to baseline now. I reviewed her recent device interrogations.  Past Medical History:  Diagnosis Date  . Adrenal tumor   . Anxiety   . Aortic aneurysm ([place])   . Asthma   . Bicuspid aortic valve    CONGENITAL  . Cancer ([place])    skin   . Carpal tunnel syndrome, bilateral   . Chronic depression   . Chronic fatigue   . Chronic sinusitis   . 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 COPD (chronic obstructive pulmonary disease) ([place])   . Degenerative disc disease    CERVICAL AND LUMBAR (DR. Lorin Mercy)  . GERD (gastroesophageal reflux disease)   . Hammer toe of right foot   . Headache   . Heart murmur   . Hepatomegaly   . HSV-1 (herpes simplex virus 1) infection   . HSV-2 (herpes simplex virus 2) infection   . Insomnia   . Lumbar stenosis   . Osteoarthritis   . Osteopenia   . Pernicious anemia   . Plantar fasciitis   . Pneumonia   . PONV (postoperative nausea and vomiting)   . Shortness of breath dyspnea   . Smoker   . Venous insufficiency of left leg   . Vitamin B 12 deficiency     Past Surgical History:  Procedure Laterality Date  .  AORTIC VALVE REPLACEMENT N/A 01/12/2015   Procedure: AORTIC VALVE REPLACEMENT (AVR);  Surgeon: Gaye Pollack, MD;  Location: [place];  Service: Open Heart Surgery;  Laterality: N/A;  . ASCENDING AORTIC ROOT REPLACEMENT N/A 01/12/2015   Procedure: ASCENDING AORTIC ROOT REPLACEMENT;  Surgeon: Gaye Pollack, MD;  Location: [place];  Service: Open Heart Surgery;  Laterality: N/A;  . BI-VENTRICULAR PACEMAKER INSERTION N/A 01/18/2015   Procedure: BI-VENTRICULAR PACEMAKER INSERTION (CRT-P);  Surgeon: Evans Lance, MD;  Location: [facility] CATH LAB;  Service: Cardiovascular;  Laterality: N/A;  . BUNIONECTOMY WITH HAMMERTOE RECONSTRUCTION Right   . CARPAL TUNNEL RELEASE Bilateral 2008  . CATARACT EXTRACTION Bilateral    X2  . CATARACT EXTRACTION W/ INTRAOCULAR LENS  IMPLANT, BILATERAL    . COLONOSCOPY    . CYST EXCISION     right side of throat  . EXCISION OF BASAL CELL CA     SKIN   . EXCISION OF VULVAR CIS    . EYE SURGERY    . FUNCTIONAL ENDOSCOPIC SINUS SURGERY    . LEFT AND RIGHT HEART CATHETERIZATION WITH CORONARY ANGIOGRAM N/A 09/23/2014   Procedure: LEFT AND RIGHT HEART CATHETERIZATION WITH CORONARY ANGIOGRAM;  Surgeon: Jeneen Rinks  Jashay Roddy, MD;  Location: [place] CATH LAB;  Service: Cardiovascular;  Laterality: N/A;  . MULTIPLE TOOTH EXTRACTIONS    . SINUS PROCEDURE  2009   x 6  . TEE WITHOUT CARDIOVERSION N/A 10/06/2014   Procedure: TRANSESOPHAGEAL ECHOCARDIOGRAM (TEE);  Surgeon: Sueanne Margarita, MD;  Location: [facility] ENDOSCOPY;  Service: Cardiovascular;  Laterality: N/A;  . TEE WITHOUT CARDIOVERSION N/A 01/12/2015   Procedure: TRANSESOPHAGEAL ECHOCARDIOGRAM (TEE);  Surgeon: Gaye Pollack, MD;  Location: [place];  Service: Open Heart Surgery;  Laterality: N/A;  . UPPER GASTROINTESTINAL ENDOSCOPY       Current Outpatient Prescriptions  Medication Sig Dispense Refill  . albuterol (PROVENTIL HFA;VENTOLIN HFA) 108 (90 BASE) MCG/ACT inhaler Inhale 1-2 puffs into the lungs every 6 (six) hours as needed for wheezing  or shortness of breath.    [name] albuterol (PROVENTIL) (2.5 MG/3ML) 0.083% nebulizer solution Take 2.5 mg by nebulization 2 (two) times daily as needed for wheezing.    . Artificial Tear Solution (GENTEAL TEARS OP) Apply 1 drop to eye 3 (three) times daily as needed (dry eyes).    [name] aspirin EC 325 MG EC tablet Take 1 tablet (325 mg total) by mouth daily. 30 tablet 0  . B Complex Vitamins (VITAMIN B COMPLEX PO) Take 1 tablet by mouth daily.     . Calcium Carbonate-Vitamin D (CALCIUM-D PO) Take 1 tablet by mouth daily.     . carvedilol (COREG) 12.5 MG tablet Take 1 tablet (12.5 mg total) by mouth 2 (two) times daily with a meal. 60 tablet 9  . cyanocobalamin (,VITAMIN B-12,) 1000 MCG/ML injection Inject 1,000 mcg into the muscle once a week.     . diazepam (VALIUM) 10 MG tablet Take 10 mg by mouth 3 (three) times daily as needed for anxiety.    [name] FLUoxetine (PROZAC) 20 MG capsule Take 20 mg by mouth daily.    . fluticasone (FLONASE) 50 MCG/ACT nasal spray Place 1 spray into the nose 2 (two) times daily.     . furosemide (LASIX) 40 MG tablet Take 40 mg by mouth daily.    . Halobetasol-Ammonium Lactate 0.05 & 12 % (CREAM) KIT Apply 1 application topically daily as needed (dryness).    [name] ibuprofen (ADVIL,MOTRIN) 200 MG tablet Take 200 mg by mouth daily as needed for headache.    . loratadine (CLARITIN) 10 MG tablet Take 10 mg by mouth daily.    [name] losartan (COZAAR) 50 MG tablet Take 50 mg by mouth daily.    . meloxicam (MOBIC) 15 MG tablet Take 15 mg by mouth daily as needed for pain.    . montelukast (SINGULAIR) 10 MG tablet Take 10 mg by mouth at bedtime.    . Multiple Vitamin (MULTIVITAMIN) capsule Take 1 capsule by mouth daily.    . Olopatadine HCl (PATANASE) 0.6 % SOLN Place 2 drops into both nostrils 2 (two) times daily.     [name] omeprazole (PRILOSEC) 20 MG capsule Take 40 mg by mouth daily.    . Probiotic Product (PROBIOTIC DAILY PO) Take 1 capsule by mouth daily. Ultimate flora    .  pseudoephedrine-guaifenesin (MUCINEX D) 60-600 MG per tablet Take 1 tablet by mouth daily as needed for congestion.     . ranitidine (ZANTAC) 300 MG capsule Take 300 mg by mouth every evening.    . Sodium Chloride-Sodium Bicarb (CLASSIC NETI POT SINUS WASH NA) Place 2 sprays into the nose daily.      No current facility-administered medications for this visit.  Allergies:   Patient has no known allergies.    ROS:  Please see the history of present illness.   Otherwise, review of systems are positive for back pain.   All other systems are reviewed and negative.    PHYSICAL EXAM: VS:  BP 136/70 (BP Location: Left Arm, Patient Position: Sitting, Cuff Size: Normal)   Pulse (!) 101   Ht _0  (1.549 m)   Wt 120 lb (54.4 kg)   BMI 22.67 kg/m  , BMI Body mass index is 22.67 kg/m. GENERAL:  Well appearing NECK:  No jugular venous distention, waveform within normal limits, carotid upstroke brisk and symmetric, no bruits, no thyromegaly LUNGS:  Clear to auscultation bilaterally BACK:  No CVA tenderness,  she is in a back brace.  CHEST:  Unremarkable HEART:  PMI not displaced or sustained,S1 and S2 within normal limits, no S3, no S4, no clicks, no rubs, 2/6 apical systolic murmur radiating out the outflow tract, no diastolic murmurs ABD:  Flat, positive bowel sounds normal in frequency in pitch, no bruits, no rebound, no guarding, no midline pulsatile mass, no hepatomegaly, no splenomegaly EXT:  2 plus pulses throughout, no edema, no cyanosis no clubbing    EKG:  EKG is not ordered today. The ekg ordered today demonstrates no   Recent Labs: 08/23/2016: ALT 23 08/31/2016: BUN 5; Creatinine, Ser 0.71; Hemoglobin 9.2; Platelets 207; Potassium 3.5; Sodium 136    Lipid Panel No results found for: CHOL, TRIG, HDL, CHOLHDL, VLDL, LDLCALC, LDLDIRECT    Wt Readings from Last 3 Encounters:  03/13/17 120 lb (54.4 kg)  02/07/17 120 lb (54.4 kg)  01/30/17 115 lb (52.2 kg)      Other  studies Reviewed: Additional studies/ records that were reviewed today include: Device interrogation. Review of the above records demonstrates:  Please see elsewhere in the note.     ASSESSMENT AND PLAN:  AVR:  This was stable on echo in May of last year.  This is stable clinically. No further imaging is indicated at this point.  TOBACCO ABUSE:   I discussed with her again the need to stop smoking.  CARDIOMYOPATHY:  The EF was mildly reduced.  However, she seems to be euvolemic. No change in therapy is planned.  ICD:   She is up-to-date with follow-up and I reviewed these tracings.   Current medicines are reviewed at length with the patient today.  The patient does not have concerns regarding medicines.  The following changes have been made:  no change  Labs/ tests ordered today include: None No orders of the defined types were placed in this encounter.    Disposition:   FU with me in 6 months.     Signed, Minus Breeding, MD  03/13/2017 12:46 PM    [place]

## 2017-03-13 ENCOUNTER — Encounter: Payer: Self-pay | Admitting: Cardiology

## 2017-03-13 ENCOUNTER — Ambulatory Visit (INDEPENDENT_AMBULATORY_CARE_PROVIDER_SITE_OTHER): Payer: Medicare Other | Admitting: Cardiology

## 2017-03-13 VITALS — BP 136/70 | HR 101 | Ht 61.0 in | Wt 120.0 lb

## 2017-03-13 DIAGNOSIS — Z720 Tobacco use: Secondary | ICD-10-CM

## 2017-03-13 DIAGNOSIS — Z952 Presence of prosthetic heart valve: Secondary | ICD-10-CM

## 2017-03-13 DIAGNOSIS — Z95810 Presence of automatic (implantable) cardiac defibrillator: Secondary | ICD-10-CM | POA: Diagnosis not present

## 2017-03-13 NOTE — Patient Instructions (Signed)

## 2017-03-14 NOTE — Telephone Encounter (Signed)
I called and advised patient, I will have her added to cancellation list

## 2017-03-24 ENCOUNTER — Ambulatory Visit (INDEPENDENT_AMBULATORY_CARE_PROVIDER_SITE_OTHER): Payer: Medicare Other

## 2017-03-24 ENCOUNTER — Ambulatory Visit (INDEPENDENT_AMBULATORY_CARE_PROVIDER_SITE_OTHER): Payer: Medicare Other | Admitting: Specialist

## 2017-03-24 ENCOUNTER — Encounter (INDEPENDENT_AMBULATORY_CARE_PROVIDER_SITE_OTHER): Payer: Self-pay | Admitting: Specialist

## 2017-03-24 VITALS — BP 138/74 | HR 87 | Ht 61.0 in | Wt 120.0 lb

## 2017-03-24 DIAGNOSIS — Z981 Arthrodesis status: Secondary | ICD-10-CM | POA: Diagnosis not present

## 2017-03-24 NOTE — Progress Notes (Signed)
Office Visit Note   Patient: Carol Warren           Date of Birth: 05-01-1946           MRN: [id number] Visit Date: 03/24/2017              Requested by: Carol Kiel, MD [address] [place], [place] [address] PCP: Carol Kiel, MD   Assessment & Plan: Visit Diagnoses:  1. History of lumbar fusion     Plan: Avoid frequent bending and stooping  No lifting greater than 10 lbs. May use ice or moist heat for pain. Weight loss is of benefit. Continue with use of the lumbar fusion external stimulator for at least 3 more months.    Follow-Up Instructions: No Follow-up on file.   Orders:  Orders Placed This Encounter  Procedures  . XR Lumbar Spine 2-3 Views   No orders of the defined types were placed in this encounter.     Procedures: No procedures performed   Clinical Data: No additional findings.   Subjective: Chief Complaint  Patient presents with  . Lower Back - Follow-up    Carol Warren is here to follow up on her back.  She states that she has been using her bone stimulator everyday and it does seem to be helping her, she feels as if she is improving with it.  Pain levels are improved, reports that she thinks she could get by with a couple of advil or 1/2 tablet of hydrocodone. She is using the lumbar fusion stimulator daily for  30-45 min and wearing her brace when out of bed. Had a CT of her body for assessing PVD 02/27/2017 showing occluded left internal iliac vessel.    Review of Systems  Constitutional: Negative.   HENT: Positive for rhinorrhea, sinus pain and sneezing.   Eyes: Negative.   Respiratory: Positive for cough and wheezing.   Cardiovascular: Negative.   Gastrointestinal: Negative.   Endocrine: Negative.   Genitourinary: Negative.   Musculoskeletal: Positive for back pain.  Skin: Negative.   Allergic/Immunologic: Negative.   Neurological: Negative.   Hematological: Negative.   Psychiatric/Behavioral: Negative.       Objective: Vital Signs: BP 138/74 (BP Location: Left Arm, Patient Position: Sitting)   Pulse 87   Ht '5\' 1"'$  (1.549 m)   Wt 120 lb (54.4 kg)   BMI 22.67 kg/m   Physical Exam  Constitutional: She is oriented to person, place, and time. She appears well-developed and well-nourished.  HENT:  Head: Normocephalic and atraumatic.  Eyes: EOM are normal. Pupils are equal, round, and reactive to light.  Neck: Normal range of motion. Neck supple.  Pulmonary/Chest: Effort normal and breath sounds normal.  Abdominal: Soft. Bowel sounds are normal.  Musculoskeletal: Normal range of motion.  Neurological: She is alert and oriented to person, place, and time.  Skin: Skin is warm and dry.  Psychiatric: She has a normal mood and affect. Her behavior is normal. Judgment and thought content normal.    Back Exam   Tenderness  The patient is experiencing tenderness in the lumbar.  Muscle Strength  Right Quadriceps:  5/5  Left Quadriceps:  5/5  Right Hamstrings:  5/5  Left Hamstrings:  5/5   Tests  Straight leg raise right: negative Straight leg raise left: negative  Reflexes  Patellar: normal Achilles: normal Babinski's sign: normal   Other  Toe Walk: normal Heel Walk: normal Sensation: normal Gait: normal  Erythema: no back redness Scars: present  Specialty Comments:  No specialty comments available.  Imaging: No results found.   PMFS History: Patient Active Problem List   Diagnosis Date Noted  . Mucoid cyst of joint 11/20/2016  . Osteoarthritis of finger of right hand 11/20/2016  . Spondylolisthesis of lumbar region 08/30/2016    Class: Chronic  . DDD (degenerative disc disease), lumbar 08/30/2016  . Spinal stenosis, lumbar region, with neurogenic claudication 08/30/2016    Class: Chronic  . Lumbar degenerative disc disease 08/30/2016  . Dizziness 06/12/2015  . ICD (implantable cardioverter-defibrillator), biventricular, in situ 02/16/2015  . Chronic  systolic heart failure ([place]) 02/16/2015  . Nonischemic cardiomyopathy ([place]) 01/19/2015  . Anemia, secondary to surgery 01/18/2015  . B12 deficiency 01/18/2015  . CHB (complete heart block)- post op 01/18/2015  . 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 Severe aortic stenosis 11/14/2014  . Pulmonary hypertension due to left ventricular systolic dysfunction 45/36/4680  . Personal history of colonic polyps 07/30/2013  . COPD GOLD 0/ still smoking  07/07/2013  . CIS (carcinoma in situ)   . Osteopenia   . HSV-1 (herpes simplex virus 1) infection   . HSV-2 (herpes simplex virus 2) infection   . Bicuspid aortic valve   . Aortic aneurysm ([place])   . Adrenal tumor   . Degenerative disc disease   . Smoker    Past Medical History:  Diagnosis Date  . Adrenal tumor   . Anxiety   . Aortic aneurysm ([place])   . Asthma   . Bicuspid aortic valve    CONGENITAL  . Cancer ([place])    skin   . Carpal tunnel syndrome, bilateral   . Chronic depression   . Chronic fatigue   . Chronic sinusitis   . 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 COPD (chronic obstructive pulmonary disease) ([place])   . Degenerative disc disease    CERVICAL AND LUMBAR (DR. Lorin Mercy)  . GERD (gastroesophageal reflux disease)   . Hammer toe of right foot   . Headache   . Heart murmur   . Hepatomegaly   . HSV-1 (herpes simplex virus 1) infection   . HSV-2 (herpes simplex virus 2) infection   . Insomnia   . Lumbar stenosis   . Osteoarthritis   . Osteopenia   . Pernicious anemia   . Plantar fasciitis   . Pneumonia   . PONV (postoperative nausea and vomiting)   . Shortness of breath dyspnea   . Smoker   . Venous insufficiency of left leg   . Vitamin B 12 deficiency     Family History  Problem Relation Age of Onset  . Pancreatic cancer Mother 39  . Diabetes Father   . Hypertension Father   . Heart disease Father 48    CAD  . Breast cancer Sister   . Diabetes Maternal  Grandmother   . Hypertension Maternal Grandmother   . Cancer Maternal Grandfather     colon or stomach  . Hypertension Paternal Grandmother   . Heart disease Paternal Grandmother     Later onset  . Diverticulosis Paternal Grandmother   . Asthma Grandchild     Past Surgical History:  Procedure Laterality Date  . AORTIC VALVE REPLACEMENT N/A 01/12/2015   Procedure: AORTIC VALVE REPLACEMENT (AVR);  Surgeon: Gaye Pollack, MD;  Location: [facility];  Service: Open Heart Surgery;  Laterality: N/A;  . ASCENDING AORTIC ROOT REPLACEMENT N/A 01/12/2015   Procedure: ASCENDING AORTIC ROOT REPLACEMENT;  Surgeon: Gaye Pollack, MD;  Location: [place];  Service: Open Heart Surgery;  Laterality: N/A;  . BI-VENTRICULAR PACEMAKER INSERTION N/A 01/18/2015   Procedure: BI-VENTRICULAR PACEMAKER INSERTION (CRT-P);  Surgeon: Evans Lance, MD;  Location: [facility] CATH LAB;  Service: Cardiovascular;  Laterality: N/A;  . BUNIONECTOMY WITH HAMMERTOE RECONSTRUCTION Right   . CARPAL TUNNEL RELEASE Bilateral 2008  . CATARACT EXTRACTION Bilateral    X2  . CATARACT EXTRACTION W/ INTRAOCULAR LENS  IMPLANT, BILATERAL    . COLONOSCOPY    . CYST EXCISION     right side of throat  . EXCISION OF BASAL CELL CA     SKIN   . EXCISION OF VULVAR CIS    . EYE SURGERY    . FUNCTIONAL ENDOSCOPIC SINUS SURGERY    . LEFT AND RIGHT HEART CATHETERIZATION WITH CORONARY ANGIOGRAM N/A 09/23/2014   Procedure: LEFT AND RIGHT HEART CATHETERIZATION WITH CORONARY ANGIOGRAM;  Surgeon: Minus Breeding, MD;  Location: [facility] CATH LAB;  Service: Cardiovascular;  Laterality: N/A;  . MULTIPLE TOOTH EXTRACTIONS    . SINUS PROCEDURE  2009   x 6  . TEE WITHOUT CARDIOVERSION N/A 10/06/2014   Procedure: TRANSESOPHAGEAL ECHOCARDIOGRAM (TEE);  Surgeon: Sueanne Margarita, MD;  Location: [facility] ENDOSCOPY;  Service: Cardiovascular;  Laterality: N/A;  . TEE WITHOUT CARDIOVERSION N/A 01/12/2015   Procedure: TRANSESOPHAGEAL ECHOCARDIOGRAM (TEE);  Surgeon: Gaye Pollack, MD;   Location: [place];  Service: Open Heart Surgery;  Laterality: N/A;  . UPPER GASTROINTESTINAL ENDOSCOPY     Social History   Occupational History  . retired Retired   Social History Main Topics  . Smoking status: Current Every Day Smoker    Packs/day: 1.00    Years: 50.00    Types: Cigarettes  . Smokeless tobacco: Never Used     Comment: uses vapor cig  . Alcohol use No  . Drug use: No  . Sexual activity: Not on file

## 2017-03-24 NOTE — Patient Instructions (Signed)
Avoid frequent bending and stooping  No lifting greater than 10 lbs. May use ice or moist heat for pain. Weight loss is of benefit. Continue with use of the lumbar fusion external stimulator for at least 3 more months.

## 2017-03-26 ENCOUNTER — Telehealth (INDEPENDENT_AMBULATORY_CARE_PROVIDER_SITE_OTHER): Payer: Self-pay | Admitting: *Deleted

## 2017-03-26 NOTE — Telephone Encounter (Signed)
Patient states that she is not getting things done by taking the tramadol they are only helping for 2-3 hrs and then when she is doing laundry she has to go lay down for the rest of the day till she can take the next pill, where as with the hydrocodone she can take half a pill and it last her half of the day and she is able to do her activities and not hurt as much.  She states that you all got side tract during visit talking about her kidney's.   And her stimulator is preset for only 30 mins per day that is the max that she can use it.  She can't change it to 45 minutes a day.

## 2017-03-26 NOTE — Telephone Encounter (Signed)
Pt calling asking for hydrocodone instead of tramadol. Pt stated this was talked about in her appt but then she didn't get anything prescribed.

## 2017-03-27 ENCOUNTER — Encounter: Payer: Self-pay | Admitting: Cardiology

## 2017-03-28 ENCOUNTER — Inpatient Hospital Stay (HOSPITAL_COMMUNITY)
Admission: RE | Admit: 2017-03-28 | Discharge: 2017-03-28 | Disposition: A | Discharge status: Home / Self Care | Payer: Medicare Other | Source: Ambulatory Visit

## 2017-03-28 NOTE — Progress Notes (Signed)
PCP:  Cardiologist: Dr. Cristopher Peru  EKG: 07/2016 in [facility]  Stress test:  ECHO: 04/2016 in [facility]  Cardiac cath:08/2014 in [facility]  Chest x-ray:07/2016 in [facility]

## 2017-03-28 NOTE — Pre-Procedure Instructions (Signed)
    Carol Warren  03/28/2017      [place], [place] [address] Phone: [phone] Fax: [phone]   Your procedure is scheduled on Thu. Apr. 5 @ 0830 AM Report to [place] at 630 AM  Call this number if you have problems the morning of surgery:[phone]   Remember:  Do not eat food or drink liquids after midnight.  Take these medicines the morning of surgery with A SIP OF WATER albuterol inhaler, albuterol nebulizer, eye drops, carvedilol(coreg), diazepam (valium) -if needed, docusate calcium (colace), fluoxetine (prozac), loratadine (claritin), montelukast (singulair), omeprazole (prilosec), tramadol (ultram)-if needed  Stop Aspirin 325 mg as instructed by your surgeon.  Stop Mobic (meloxicam). Stop taking any Vitamins or Herbal Medications.  Goody's, BC's, Aleve, Advil, Motrin, Ibuprofen, naproxen or Fish oil.   Do not wear jewelry, make-up or nail polish.  Do not wear lotions, powders, or perfumes, or deoderant.  Do not shave 48 hours prior to surgery.  Men may shave face and neck.  Do not bring valuables to the hospital.  [facility] is not responsible for any belongings or valuables.  Contacts, dentures or bridgework may not be worn into surgery.  Leave your suitcase in the car.  After surgery it may be brought to your room.  For patients admitted to the hospital, discharge time will be determined by your treatment team.  Patients discharged the day of surgery will not be allowed to drive home.    Special instructions:  See attached  Please read over the following fact sheets that you were given. Pain Booklet, Coughing and Deep Breathing and Surgical Site Infection Prevention

## 2017-04-01 NOTE — Telephone Encounter (Signed)
I do not recommend anything strong than tramadol for her condition. Carol Warren

## 2017-04-02 ENCOUNTER — Encounter (HOSPITAL_COMMUNITY)
Admission: RE | Admit: 2017-04-02 | Discharge: 2017-04-02 | Disposition: A | Discharge status: Home / Self Care | Payer: Medicare Other | Source: Ambulatory Visit | Attending: Orthopedic Surgery | Admitting: Orthopedic Surgery

## 2017-04-02 ENCOUNTER — Encounter (HOSPITAL_COMMUNITY): Payer: Self-pay

## 2017-04-02 DIAGNOSIS — M67441 Ganglion, right hand: Secondary | ICD-10-CM | POA: Diagnosis not present

## 2017-04-02 DIAGNOSIS — I10 Essential (primary) hypertension: Secondary | ICD-10-CM | POA: Diagnosis not present

## 2017-04-02 DIAGNOSIS — Z8249 Family history of ischemic heart disease and other diseases of the circulatory system: Secondary | ICD-10-CM | POA: Diagnosis not present

## 2017-04-02 DIAGNOSIS — F329 Major depressive disorder, single episode, unspecified: Secondary | ICD-10-CM | POA: Diagnosis not present

## 2017-04-02 DIAGNOSIS — G4700 Insomnia, unspecified: Secondary | ICD-10-CM | POA: Diagnosis not present

## 2017-04-02 DIAGNOSIS — Z95810 Presence of automatic (implantable) cardiac defibrillator: Secondary | ICD-10-CM | POA: Diagnosis not present

## 2017-04-02 DIAGNOSIS — I2510 Atherosclerotic heart disease of native coronary artery without angina pectoris: Secondary | ICD-10-CM | POA: Diagnosis not present

## 2017-04-02 DIAGNOSIS — Z85828 Personal history of other malignant neoplasm of skin: Secondary | ICD-10-CM | POA: Diagnosis not present

## 2017-04-02 DIAGNOSIS — F17210 Nicotine dependence, cigarettes, uncomplicated: Secondary | ICD-10-CM | POA: Diagnosis not present

## 2017-04-02 DIAGNOSIS — Z952 Presence of prosthetic heart valve: Secondary | ICD-10-CM | POA: Diagnosis not present

## 2017-04-02 DIAGNOSIS — J449 Chronic obstructive pulmonary disease, unspecified: Secondary | ICD-10-CM | POA: Diagnosis not present

## 2017-04-02 DIAGNOSIS — M19041 Primary osteoarthritis, right hand: Secondary | ICD-10-CM | POA: Diagnosis present

## 2017-04-02 HISTORY — DX: Presence of automatic (implantable) cardiac defibrillator: Z95.810

## 2017-04-02 HISTORY — DX: Atherosclerotic heart disease of native coronary artery without angina pectoris: I25.10

## 2017-04-02 HISTORY — DX: Essential (primary) hypertension: I10

## 2017-04-02 LAB — BASIC METABOLIC PANEL
Anion gap: 11 (ref 5–15)
BUN: 20 mg/dL (ref 6–20)
CHLORIDE: 98 mmol/L — AB (ref 101–111)
CO2: 25 mmol/L (ref 22–32)
Calcium: 9.3 mg/dL (ref 8.9–10.3)
Creatinine, Ser: 1.26 mg/dL — ABNORMAL HIGH (ref 0.44–1.00)
GFR calc Af Amer: 49 mL/min — ABNORMAL LOW (ref >60)
GFR calc non Af Amer: 42 mL/min — ABNORMAL LOW (ref >60)
Glucose, Bld: 95 mg/dL (ref 65–99)
POTASSIUM: 3.9 mmol/L (ref 3.5–5.1)
Sodium: 134 mmol/L — ABNORMAL LOW (ref 135–145)

## 2017-04-02 LAB — CBC
HEMATOCRIT: 34.7 % — AB (ref 36.0–46.0)
HEMOGLOBIN: 11.6 g/dL — AB (ref 12.0–15.0)
MCH: 29.7 pg (ref 26.0–34.0)
MCHC: 33.4 g/dL (ref 30.0–36.0)
MCV: 89 fL (ref 78.0–100.0)
Platelets: 359 10*3/uL (ref 150–400)
RBC: 3.9 MIL/uL (ref 3.87–5.11)
RDW: 13.5 % (ref 11.5–15.5)
WBC: 10.9 10*3/uL — ABNORMAL HIGH (ref 4.0–10.5)

## 2017-04-02 MED ORDER — CEFAZOLIN SODIUM-DEXTROSE 2-4 GM/100ML-% IV SOLN
2.0000 g | INTRAVENOUS | Status: AC
  Administered 2017-04-03: 2 g via INTRAVENOUS
  Filled 2017-04-02: qty 100

## 2017-04-02 NOTE — Progress Notes (Signed)
Anesthesia Chart Review:  Pt is a 71 year old female scheduled for excision cyst debridement R index finger DIP on 04/03/2017 with Daryll Brod, M.D.  - Cardiologist is Minus Breeding, MD; last office visit 03/13/17. Dr. Percival Spanish cleared pt for surgery in telephone encounter 11/27/16.  - EP cardiologist is Cristopher Peru, MD; last office visit 02/07/17.  - PCP is Ernestene Kiel, MD   PMH includes:  Hx aortic stenosis, aortic aneurysm (s/p AVR, [place] 01/12/15), cardiomyopathy, BiV ICD ([employer]), asthma, COPD, vulvar cancer, adrenal tumor, post-op N/V, GERD. Current smoker. BMI 22. S/p lumbar decompression and fusion 08/30/16.   Medications include: albuterol, ASA '325mg'$ , carvedilol, lasix, losartan, prilosec, zantac, rosuvastatin  Preoperative labs reviewed. Cr 1.26, elevated from usual baseline. I routed BMET results to PCP.   chest X-ray 08/23/16: No active cardiopulmonary disease.  EKG 02/07/17: electronic ventricular pacemaker  Echo 05/14/16:  - Left ventricle: The cavity size was normal. Wall thickness was normal. Systolic function was mildly to moderately reduced. The estimated ejection fraction was in the range of 40% to 45%. There is hypokinesis of the basalinferolateral myocardium. Features are consistent with a pseudonormal left ventricular filling pattern, with concomitant abnormal relaxation and increased filling pressure (grade 2 diastolic dysfunction). - Aortic valve: A bioprosthesis was present. There was trivial regurgitation. - Mitral valve: Calcified annulus. There was moderate regurgitation. - Left atrium: The atrium was moderately dilated. - Pulmonary arteries: Systolic pressure was mildly increased. PA peak pressure: 33 mm Hg (S). - Impressions: Hypokinesis of the basal inferolateral wall with overall mild to moderate LV dysfunction; grade 2 diastolic dysfunction; moderate LAE; bioprosthetic aortic valve with trace AI; moderate MR; mild TR with mildy elevated pulmonary  pressure.  Cardiac cath 09/23/14:  - Final Conclusions:Mild coronary plaque (25 LAD; 30% CX). LV gradient mild but might be underestimated given estimate of LV function on echo. Mild AI by cath. Elevated EDP and significantly elevated pulmonary pressures. O2 sats were low during the procedure.   Perioperative prescription for ICD pending.  Notes indicate pt is pacemaker dependent.   If no changes, I anticipate pt can proceed with surgery as scheduled.   Willeen Cass, FNP-BC [facility]/Anesthesiology Phone: [phone] 04/02/2017 4:50 PM

## 2017-04-03 ENCOUNTER — Ambulatory Visit (HOSPITAL_COMMUNITY): Payer: Medicare Other | Admitting: Anesthesiology

## 2017-04-03 ENCOUNTER — Encounter (HOSPITAL_COMMUNITY)
Admission: RE | Disposition: A | Discharge status: Home / Self Care | Payer: Self-pay | Source: Ambulatory Visit | Attending: Orthopedic Surgery

## 2017-04-03 ENCOUNTER — Encounter (HOSPITAL_COMMUNITY): Payer: Self-pay | Admitting: Certified Registered Nurse Anesthetist

## 2017-04-03 ENCOUNTER — Ambulatory Visit (HOSPITAL_COMMUNITY): Payer: Medicare Other | Admitting: Vascular Surgery

## 2017-04-03 ENCOUNTER — Ambulatory Visit (HOSPITAL_COMMUNITY)
Admission: RE | Admit: 2017-04-03 | Discharge: 2017-04-03 | Disposition: A | Discharge status: Home / Self Care | Payer: Medicare Other | Source: Ambulatory Visit | Attending: Orthopedic Surgery | Admitting: Orthopedic Surgery

## 2017-04-03 DIAGNOSIS — M19041 Primary osteoarthritis, right hand: Secondary | ICD-10-CM | POA: Insufficient documentation

## 2017-04-03 DIAGNOSIS — Z85828 Personal history of other malignant neoplasm of skin: Secondary | ICD-10-CM | POA: Diagnosis not present

## 2017-04-03 DIAGNOSIS — Z95810 Presence of automatic (implantable) cardiac defibrillator: Secondary | ICD-10-CM | POA: Insufficient documentation

## 2017-04-03 DIAGNOSIS — J449 Chronic obstructive pulmonary disease, unspecified: Secondary | ICD-10-CM | POA: Insufficient documentation

## 2017-04-03 DIAGNOSIS — Z952 Presence of prosthetic heart valve: Secondary | ICD-10-CM | POA: Insufficient documentation

## 2017-04-03 DIAGNOSIS — I10 Essential (primary) hypertension: Secondary | ICD-10-CM | POA: Insufficient documentation

## 2017-04-03 DIAGNOSIS — G4700 Insomnia, unspecified: Secondary | ICD-10-CM | POA: Insufficient documentation

## 2017-04-03 DIAGNOSIS — F17210 Nicotine dependence, cigarettes, uncomplicated: Secondary | ICD-10-CM | POA: Insufficient documentation

## 2017-04-03 DIAGNOSIS — M67441 Ganglion, right hand: Secondary | ICD-10-CM | POA: Diagnosis not present

## 2017-04-03 DIAGNOSIS — F329 Major depressive disorder, single episode, unspecified: Secondary | ICD-10-CM | POA: Insufficient documentation

## 2017-04-03 DIAGNOSIS — Z8249 Family history of ischemic heart disease and other diseases of the circulatory system: Secondary | ICD-10-CM | POA: Insufficient documentation

## 2017-04-03 DIAGNOSIS — I2510 Atherosclerotic heart disease of native coronary artery without angina pectoris: Secondary | ICD-10-CM | POA: Insufficient documentation

## 2017-04-03 HISTORY — PX: MASS EXCISION: SHX2000

## 2017-04-03 SURGERY — EXCISION MASS
Anesthesia: Monitor Anesthesia Care | Site: Finger | Laterality: Right

## 2017-04-03 MED ORDER — LACTATED RINGERS IV SOLN
INTRAVENOUS | Status: DC
  Administered 2017-04-03 (×2): via INTRAVENOUS

## 2017-04-03 MED ORDER — FENTANYL CITRATE (PF) 100 MCG/2ML IJ SOLN
INTRAMUSCULAR | Status: DC | PRN
  Administered 2017-04-03: 50 ug via INTRAVENOUS
  Administered 2017-04-03: 25 ug via INTRAVENOUS

## 2017-04-03 MED ORDER — FENTANYL CITRATE (PF) 100 MCG/2ML IJ SOLN: 25.0000 ug | INTRAMUSCULAR | Status: DC | PRN

## 2017-04-03 MED ORDER — ONDANSETRON HCL 4 MG/2ML IJ SOLN
INTRAMUSCULAR | Status: DC | PRN
  Administered 2017-04-03: 4 mg via INTRAVENOUS

## 2017-04-03 MED ORDER — FENTANYL CITRATE (PF) 250 MCG/5ML IJ SOLN
INTRAMUSCULAR | Status: AC
  Filled 2017-04-03: qty 5

## 2017-04-03 MED ORDER — PROPOFOL 10 MG/ML IV BOLUS
INTRAVENOUS | Status: AC
  Filled 2017-04-03: qty 40

## 2017-04-03 MED ORDER — MIDAZOLAM HCL 5 MG/5ML IJ SOLN
INTRAMUSCULAR | Status: DC | PRN
  Administered 2017-04-03 (×2): 1 mg via INTRAVENOUS

## 2017-04-03 MED ORDER — ONDANSETRON HCL 4 MG/2ML IJ SOLN: 4.0000 mg | Freq: Four times a day (QID) | INTRAMUSCULAR | Status: DC | PRN

## 2017-04-03 MED ORDER — LIDOCAINE HCL (CARDIAC) 20 MG/ML IV SOLN
INTRAVENOUS | Status: DC | PRN
  Administered 2017-04-03: 200 mg via INTRAVENOUS

## 2017-04-03 MED ORDER — BUPIVACAINE HCL (PF) 0.25 % IJ SOLN
INTRAMUSCULAR | Status: AC
  Filled 2017-04-03: qty 30

## 2017-04-03 MED ORDER — BUPIVACAINE HCL (PF) 0.25 % IJ SOLN
INTRAMUSCULAR | Status: DC | PRN
  Administered 2017-04-03: 30 mL

## 2017-04-03 MED ORDER — OXYCODONE HCL 5 MG/5ML PO SOLN: 5.0000 mg | Freq: Once | ORAL | Status: DC | PRN

## 2017-04-03 MED ORDER — HYDROCODONE-ACETAMINOPHEN 5-325 MG PO TABS: 1.0000 | ORAL_TABLET | Freq: Four times a day (QID) | ORAL | 0 refills | Status: DC | PRN

## 2017-04-03 MED ORDER — CHLORHEXIDINE GLUCONATE 4 % EX LIQD: 60.0000 mL | Freq: Once | CUTANEOUS | Status: DC

## 2017-04-03 MED ORDER — OXYCODONE HCL 5 MG PO TABS: 5.0000 mg | ORAL_TABLET | Freq: Once | ORAL | Status: DC | PRN

## 2017-04-03 MED ORDER — PROPOFOL 10 MG/ML IV BOLUS
INTRAVENOUS | Status: DC | PRN
  Administered 2017-04-03 (×2): 20 mg via INTRAVENOUS

## 2017-04-03 MED ORDER — MIDAZOLAM HCL 2 MG/2ML IJ SOLN
INTRAMUSCULAR | Status: AC
  Filled 2017-04-03: qty 2

## 2017-04-03 SURGICAL SUPPLY — 42 items
BAG DECANTER FOR FLEXI CONT (MISCELLANEOUS) IMPLANT
BLADE MINI RND TIP GREEN BEAV (BLADE) IMPLANT
BNDG CMPR 9X4 STRL LF SNTH (GAUZE/BANDAGES/DRESSINGS) ×1
BNDG COHESIVE 1X5 TAN STRL LF (GAUZE/BANDAGES/DRESSINGS) ×3 IMPLANT
BNDG COHESIVE 3X5 TAN STRL LF (GAUZE/BANDAGES/DRESSINGS) IMPLANT
BNDG ESMARK 4X9 LF (GAUZE/BANDAGES/DRESSINGS) ×3 IMPLANT
BNDG GAUZE ELAST 4 BULKY (GAUZE/BANDAGES/DRESSINGS) IMPLANT
CORDS BIPOLAR (ELECTRODE) ×3 IMPLANT
CUFF TOURNIQUET SINGLE 18IN (TOURNIQUET CUFF) IMPLANT
CUFF TOURNIQUET SINGLE 24IN (TOURNIQUET CUFF) IMPLANT
DRAPE OEC MINIVIEW 54X84 (DRAPES) IMPLANT
DURAPREP 26ML APPLICATOR (WOUND CARE) ×3 IMPLANT
GAUZE SPONGE 4X4 12PLY STRL (GAUZE/BANDAGES/DRESSINGS) ×2 IMPLANT
GAUZE XEROFORM 1X8 LF (GAUZE/BANDAGES/DRESSINGS) ×3 IMPLANT
GLOVE BIO SURGEON STRL SZ 6.5 (GLOVE) IMPLANT
GLOVE BIO SURGEONS STRL SZ 6.5 (GLOVE)
GOWN STRL REUS W/ TWL LRG LVL3 (GOWN DISPOSABLE) ×2 IMPLANT
GOWN STRL REUS W/ TWL XL LVL3 (GOWN DISPOSABLE) ×1 IMPLANT
GOWN STRL REUS W/TWL LRG LVL3 (GOWN DISPOSABLE) ×6
GOWN STRL REUS W/TWL XL LVL3 (GOWN DISPOSABLE) ×3
KIT BASIN OR (CUSTOM PROCEDURE TRAY) ×3 IMPLANT
KIT ROOM TURNOVER OR (KITS) ×3 IMPLANT
MANIFOLD NEPTUNE II (INSTRUMENTS) ×1 IMPLANT
NDL HYPO 25GX1X1/2 BEV (NEEDLE) IMPLANT
NEEDLE HYPO 25GX1X1/2 BEV (NEEDLE) ×3 IMPLANT
NS IRRIG 1000ML POUR BTL (IV SOLUTION) ×3 IMPLANT
PACK ORTHO EXTREMITY (CUSTOM PROCEDURE TRAY) ×3 IMPLANT
PAD ARMBOARD 7.5X6 YLW CONV (MISCELLANEOUS) ×6 IMPLANT
PAD CAST 4YDX4 CTTN HI CHSV (CAST SUPPLIES) IMPLANT
PADDING CAST COTTON 4X4 STRL (CAST SUPPLIES)
RUBBERBAND STERILE (MISCELLANEOUS) ×1 IMPLANT
SPECIMEN JAR SMALL (MISCELLANEOUS) ×3 IMPLANT
SPLINT FINGER (SOFTGOODS) ×2 IMPLANT
SPONGE SCRUB IODOPHOR (GAUZE/BANDAGES/DRESSINGS) ×3 IMPLANT
SUT ETHILON 5 0 PS 2 18 (SUTURE) ×2 IMPLANT
SUT SILK 4 0 PS 2 (SUTURE) IMPLANT
SUT VICRYL 4-0 PS2 18IN ABS (SUTURE) IMPLANT
SYR CONTROL 10ML LL (SYRINGE) ×3 IMPLANT
TOWEL OR 17X24 6PK STRL BLUE (TOWEL DISPOSABLE) ×3 IMPLANT
TOWEL OR 17X26 10 PK STRL BLUE (TOWEL DISPOSABLE) ×3 IMPLANT
UNDERPAD 30X30 (UNDERPADS AND DIAPERS) ×3 IMPLANT
WATER STERILE IRR 1000ML POUR (IV SOLUTION) IMPLANT

## 2017-04-03 NOTE — Brief Op Note (Signed)
04/03/2017  9:04 AM  PATIENT:  Carol Warren  71 y.o. 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  BLOOD ADMINISTERED:none  DRAINS: none   LOCAL MEDICATIONS USED:  BUPIVICAINE   SPECIMEN:  Excision  DISPOSITION OF SPECIMEN:  PATHOLOGY  COUNTS:  YES  TOURNIQUET:    DICTATION: .Other Dictation: Dictation Number [id number]  PLAN OF CARE: Discharge to home after PACU  PATIENT DISPOSITION:  PACU - hemodynamically stable.

## 2017-04-03 NOTE — H&P (Signed)
Carol Warren is an 71 y.o. female.   Chief Complaint:mucoid cyst right index finger HPI: Carol Warren is a 71 year old right-hand-dominant female referred by Dr. Laqueta Due for consultation regarding a mass on her right index finger deformity to her nail. This been present for months. She recalls no history of injury. She states it only hurts if she hits the area. She has no pain otherwise. She has had carpal tunnel release done in the past by Dr. Lorin Mercy 15 years ago. She has had back surgery. She has had some cardiac surgery. She does smoke and advised to quit. She has a history of arthritis no history of diabetes thyroid problems or gout. Family history is positive diabetes and thyroid problems negative for arthritis and gout. She has been tested for diabetes per she is presently taking aspirin only for a anticoagulant.      Past Medical History:  Diagnosis Date  . Adrenal tumor   . AICD (automatic cardioverter/defibrillator) present   . Anxiety   . Aortic aneurysm ([place])   . Asthma   . Bicuspid aortic valve    CONGENITAL  . Cancer ([place])    skin   . Carpal tunnel syndrome, bilateral   . Chronic depression   . Chronic fatigue   . Chronic sinusitis   . 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 COPD (chronic obstructive pulmonary disease) ([place])   . Coronary artery disease   . Degenerative disc disease    CERVICAL AND LUMBAR (DR. Lorin Mercy)  . GERD (gastroesophageal reflux disease)   . Hammer toe of right foot   . Headache   . Heart murmur   . Hepatomegaly   . HSV-1 (herpes simplex virus 1) infection   . HSV-2 (herpes simplex virus 2) infection   . Hypertension   . Insomnia   . Lumbar stenosis   . Osteoarthritis   . Osteopenia   . Pernicious anemia   . Plantar fasciitis   . Shortness of breath dyspnea    with exertion  . Smoker   . Venous insufficiency of left leg   . Vitamin B 12 deficiency     Past Surgical History:  Procedure Laterality Date  . AORTIC VALVE REPLACEMENT N/A  01/12/2015   Procedure: AORTIC VALVE REPLACEMENT (AVR);  Surgeon: Gaye Pollack, MD;  Location: [facility];  Service: Open Heart Surgery;  Laterality: N/A;  . ASCENDING AORTIC ROOT REPLACEMENT N/A 01/12/2015   Procedure: ASCENDING AORTIC ROOT REPLACEMENT;  Surgeon: Gaye Pollack, MD;  Location: [facility];  Service: Open Heart Surgery;  Laterality: N/A;  . BI-VENTRICULAR PACEMAKER INSERTION N/A 01/18/2015   Procedure: BI-VENTRICULAR PACEMAKER INSERTION (CRT-P);  Surgeon: Evans Lance, MD;  Location: [facility] CATH LAB;  Service: Cardiovascular;  Laterality: N/A;  . BUNIONECTOMY WITH HAMMERTOE RECONSTRUCTION Right   . CARPAL TUNNEL RELEASE Bilateral 2008  . CATARACT EXTRACTION Bilateral    X2  . CATARACT EXTRACTION W/ INTRAOCULAR LENS  IMPLANT, BILATERAL    . COLONOSCOPY    . CYST EXCISION     right side of throat  . EXCISION OF BASAL CELL CA     SKIN   . EXCISION OF VULVAR CIS    . EYE SURGERY    . FUNCTIONAL ENDOSCOPIC SINUS SURGERY    . LEFT AND RIGHT HEART CATHETERIZATION WITH CORONARY ANGIOGRAM N/A 09/23/2014   Procedure: LEFT AND RIGHT HEART CATHETERIZATION WITH CORONARY ANGIOGRAM;  Surgeon: Minus Breeding, MD;  Location[facility] LAB;  Service: Cardiovascular;  Laterality: N/A;  .  MULTIPLE TOOTH EXTRACTIONS    . SINUS PROCEDURE  2009   x 6  . TEE WITHOUT CARDIOVERSION N/A 10/06/2014   Procedure: TRANSESOPHAGEAL ECHOCARDIOGRAM (TEE);  Surgeon: Sueanne Margarita, MD;  Location[facility] ENDOSCOPY;  Service: Cardiovascular;  Laterality: N/A;  . TEE WITHOUT CARDIOVERSION N/A 01/12/2015   Procedure: TRANSESOPHAGEAL ECHOCARDIOGRAM (TEE);  Surgeon: Gaye Pollack, MD;  Location: [facility];  Service: Open Heart Surgery;  Laterality: N/A;  . UPPER GASTROINTESTINAL ENDOSCOPY      Family History  Problem Relation Age of Onset  . Pancreatic cancer Mother 29  . Diabetes Father   . Hypertension Father   . Heart disease Father 34    CAD  . Breast cancer Sister   . Diabetes Maternal Grandmother   . Hypertension  Maternal Grandmother   . Cancer Maternal Grandfather     colon or stomach  . Hypertension Paternal Grandmother   . Heart disease Paternal Grandmother     Later onset  . Diverticulosis Paternal Grandmother   . Asthma Grandchild    Social History:  reports that she has been smoking Cigarettes.  She has a 25.00 pack-year smoking history. She has never used smokeless tobacco. She reports that she does not drink alcohol or use drugs.  Allergies:  Allergies  Allergen Reactions  . No Known Allergies     No prescriptions prior to admission.    Results for orders placed or performed during the hospital encounter of 04/02/17 (from the past 48 hour(s))  Basic metabolic panel     Status: Abnormal   Collection Time: 04/02/17  3:02 PM  Result Value Ref Range   Sodium 134 (L) 135 - 145 mmol/L   Potassium 3.9 3.5 - 5.1 mmol/L   Chloride 98 (L) 101 - 111 mmol/L   CO2 25 22 - 32 mmol/L   Glucose, Bld 95 65 - 99 mg/dL   BUN 20 6 - 20 mg/dL   Creatinine, Ser 1.26 (H) 0.44 - 1.00 mg/dL   Calcium 9.3 8.9 - 10.3 mg/dL   GFR calc non Af Amer 42 (L) >60 mL/min   GFR calc Af Amer 49 (L) >60 mL/min    Comment: (NOTE) The eGFR has been calculated using the CKD EPI equation. This calculation has not been validated in all clinical situations. eGFR's persistently <60 mL/min signify possible Chronic Kidney Disease.    Anion gap 11 5 - 15  CBC     Status: Abnormal   Collection Time: 04/02/17  3:02 PM  Result Value Ref Range   WBC 10.9 (H) 4.0 - 10.5 K/uL   RBC 3.90 3.87 - 5.11 MIL/uL   Hemoglobin 11.6 (L) 12.0 - 15.0 g/dL   HCT 34.7 (L) 36.0 - 46.0 %   MCV 89.0 78.0 - 100.0 fL   MCH 29.7 26.0 - 34.0 pg   MCHC 33.4 30.0 - 36.0 g/dL   RDW 13.5 11.5 - 15.5 %   Platelets 359 150 - 400 K/uL    No results found.   Pertinent items are noted in HPI.  There were no vitals taken for this visit.  General appearance: alert, cooperative and appears stated age Head: Normocephalic, without obvious  abnormality Neck: no JVD Resp: clear to auscultation bilaterally Cardio: regular rate and rhythm, S1, S2 normal, no murmur, click, rub or gallop GI: soft, non-tender; bowel sounds normal; no masses,  no organomegaly Extremities: cyst right index finger with DJD distal interphanangeal joint Pulses: 2+ and symmetric Skin: Skin color, texture, turgor normal.  No rashes or lesions Neurologic: Grossly normal Incision/Wound: na  Assessment/Plan Assessment:  1. Mucoid cyst of joint  2. 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 She would like to proceed to have that done. Pre-peri-and postoperative course are discussed along with risk complications. She is aware that there is no guarantee to the surgery the possibility of infection recurrence injury to arteries nerves tendons possibility of recurrence possibility of fusion of the joint being necessary. She would like to proceed and is scheduled for excision of mucoid cyst debridement distal interphalangeal joint right index finger as an outpatient under regional anesthesia      Poseidon Pam R 04/03/2017, 4:48 AM

## 2017-04-03 NOTE — Op Note (Signed)
NAMEKRISTIANNE, ALBIN NO.:  [id number]  MEDICAL RECORD NO.:  [id number]  LOCATION:                                 FACILITY:  PHYSICIAN:  Daryll Brod, M.D.            DATE OF BIRTH:  DATE OF PROCEDURE:  04/03/2017 DATE OF DISCHARGE:                              OPERATIVE REPORT   PREOPERATIVE DIAGNOSES:  Mucoid tumor, right index finger with degenerative arthritis, distal interphalangeal joint.  POSTOPERATIVE DIAGNOSES:  Mucoid tumor, right index finger with degenerative arthritis, distal interphalangeal joint.  OPERATION:  Excision of mucoid cyst with debridement of osteophytes at DIP joint right index finger and middle phalanx.  SURGEON:  Daryll Brod, MD.  ASSISTANT:  None.  ANESTHESIA:  Forearm-based IV regional with metacarpal block.  PLACE OF SURGERY:  [place]:  Dr. Marcie Bal.  HISTORY:  The patient is a 71 year old female with a history of a mucoid cyst on her right distal interphalangeal joint, right index finger.  She is desirous of having this excised.  She has deformity of the nail plate.  Pre, peri, and postoperative course have been discussed along with risks and complications.  She is aware that there is no guarantee to the surgery, the possibility of infection; recurrence of injury to arteries, nerves, tendons; incomplete relief of symptoms and dystrophy. In preoperative area, the patient is seen, the extremity marked by both patient and surgeon.  Antibiotic given.  PROCEDURE IN DETAIL:  The patient was brought to the operating room, where a forearm-based IV regional anesthetic was carried out without difficulty.  She was prepped using ChloraPrep in a supine position with the right arm free.  A 3-minute dry time was allowed and a time-out taken, confirming the patient and procedure.  A metacarpal block was then given to the index finger right hand with 0.25% bupivacaine without epinephrine approximately 9 mL  was used.  A curvilinear incision was made over the distal interphalangeal joint, carried down to lateral border of the right index finger.  This was carried down through subcutaneous tissue.  Bleeders were electrocauterized with bipolar as necessary.  A dissection was then carried distally under the skin to the cyst distally with a House curette and a small rongeur.  This was excised and sent to Pathology.  The joint was then opened on its radial aspect.  The osteophytes on the middle phalanx were immediately apparent.  A synovectomy was performed dorsally, and both House curette and small rongeur were then used to remove the osteophytes from the middle phalanx.  The wound was copiously irrigated with saline.  The skin closed with interrupted 5-0 nylon sutures.  A sterile compressive dressing and a splint to the finger was applied.  On deflation of the tourniquet, remaining fingers pinked.  She was taken to the recovery room for observation in satisfactory condition.  It was noted that the osteophytes and debridement of the joint were also sent to Pathology.  She will be discharged to home to return to the [place] in 1 week, on [place].  ______________________________ Daryll Brod, M.D.     GK/MEDQ  D:  04/03/2017  T:  04/03/2017  Job:  [id number]

## 2017-04-03 NOTE — Discharge Instructions (Signed)

## 2017-04-03 NOTE — Op Note (Signed)
Dictation Number [id number]

## 2017-04-03 NOTE — Transfer of Care (Signed)
Immediate Anesthesia Transfer of Care Note  Patient: Carol Warren  Procedure(s) Performed: Procedure(s) with comments: EXCISION CYST DEBRIDMENT DISTAL INTERPHALANEAL RIGHT INDEX FINGER (Right) - REG/FAB  Patient Location: PACU  Anesthesia Type:MAC and Bier block  Level of Consciousness: awake, alert , oriented and sedated  Airway & Oxygen Therapy: Patient Spontanous Breathing and Patient connected to nasal cannula oxygen  Post-op Assessment: Report given to RN, Post -op Vital signs reviewed and stable and Patient moving all extremities  Post vital signs: Reviewed and stable  Last Vitals:  Vitals:   04/03/17 0657 04/03/17 0910  BP: (!) 116/52   Pulse: 81 86  Resp: 18 14  Temp: 36.6 C 36.2 C    Last Pain:  Vitals:   04/03/17 0657  TempSrc: Oral      Patients Stated Pain Goal: 2 (17/00/17 4944)  Complications: No apparent anesthesia complications

## 2017-04-03 NOTE — Anesthesia Postprocedure Evaluation (Addendum)
Anesthesia Post Note  Patient: Carol Warren  Procedure(s) Performed: Procedure(s) (LRB): EXCISION CYST DEBRIDMENT DISTAL INTERPHALANEAL RIGHT INDEX FINGER (Right)  Patient location during evaluation: PACU Anesthesia Type: MAC Level of consciousness: awake and alert Pain management: pain level controlled Vital Signs Assessment: post-procedure vital signs reviewed and stable Respiratory status: spontaneous breathing, nonlabored ventilation, respiratory function stable and patient connected to nasal cannula oxygen Cardiovascular status: stable and blood pressure returned to baseline Anesthetic complications: no       Last Vitals:  Vitals:   04/03/17 0910 04/03/17 0945  BP: 132/66 104/68  Pulse: 86 80  Resp: 14 20  Temp: 36.2 C 36.4 C    Last Pain:  Vitals:   04/03/17 0657  TempSrc: Oral                 Keitha Kolk S

## 2017-04-03 NOTE — Anesthesia Preprocedure Evaluation (Signed)
Anesthesia Evaluation  Patient identified by MRN, date of birth, ID band Patient awake    Reviewed: Allergy & Precautions, H&P , NPO status , Patient's Chart, lab work & pertinent test results  Airway Mallampati: II   Neck ROM: full    Dental   Pulmonary shortness of breath, asthma , COPD, Current Smoker,    breath sounds clear to auscultation       Cardiovascular hypertension, + CAD and + Peripheral Vascular Disease  + dysrhythmias + pacemaker + Cardiac Defibrillator + Valvular Problems/Murmurs  Rhythm:regular Rate:Normal  s//p AVR   Neuro/Psych  Headaches, Anxiety Depression  Neuromuscular disease    GI/Hepatic GERD  ,  Endo/Other    Renal/GU      Musculoskeletal  (+) Arthritis ,   Abdominal   Peds  Hematology   Anesthesia Other Findings   Reproductive/Obstetrics                             Anesthesia Physical Anesthesia Plan  ASA: III  Anesthesia Plan: MAC and Bier Block   Post-op Pain Management:    Induction: Intravenous  Airway Management Planned: Simple Face Mask  Additional Equipment:   Intra-op Plan:   Post-operative Plan:   Informed Consent: I have reviewed the patients History and Physical, chart, labs and discussed the procedure including the risks, benefits and alternatives for the proposed anesthesia with the patient or authorized representative who has indicated his/her understanding and acceptance.     Plan Discussed with: CRNA, Anesthesiologist and Surgeon  Anesthesia Plan Comments:         Anesthesia Quick Evaluation

## 2017-04-04 ENCOUNTER — Encounter (HOSPITAL_COMMUNITY): Payer: Self-pay | Admitting: Orthopedic Surgery

## 2017-04-04 NOTE — Telephone Encounter (Signed)
I called and advised patient and she states that she would rather take ibuprofen than the tramadol. She states that her 29lb dog takes 1 tramadol a day for her pain and 2 pills for her a day is not enough for her to make it all day.  She states that it only helps for about 3-4 hours then she has to deal with the pain till its time to take it again and she has to go back to bed till then.  She had a cyst removed from her finger yesterday by Dr. Fredna Dow and he did give her Hydrocodone for 25 days.  She states that she is still using her bone stimulator everyday.

## 2017-04-15 ENCOUNTER — Ambulatory Visit (INDEPENDENT_AMBULATORY_CARE_PROVIDER_SITE_OTHER): Payer: Medicare Other

## 2017-04-15 DIAGNOSIS — Z95810 Presence of automatic (implantable) cardiac defibrillator: Secondary | ICD-10-CM | POA: Diagnosis not present

## 2017-04-15 DIAGNOSIS — I5022 Chronic systolic (congestive) heart failure: Secondary | ICD-10-CM | POA: Diagnosis not present

## 2017-04-15 NOTE — Progress Notes (Signed)
EPIC Encounter for ICM Monitoring  Patient Name: LORALYE LOBERG is a 71 y.o. female Date: 04/15/2017 Primary Care Physican: Ernestene Kiel, MD Primary Cardiologist:[place] Electrophysiologist: Lovena Le Dry Weight:121 lbs Bi-V Pacing: 96.2%      Heart Failure questions reviewed, pt asymptomatic.   Thoracic impedance normal   Prescribed and confirmed dosage: Furosemide 40 mg 1 tablet daily  Recommendations: No changes. Discussed to limit salt intake to 2000 mg/day and fluid intake to < 2 liters/day.  Encouraged to call for fluid symptoms.  Follow-up plan: ICM clinic phone appointment on 05/19/2017.   Copy of ICM check sent to device physician.   3 month ICM trend: 04/15/2017   1 Year ICM trend:      Rosalene Billings, RN 04/15/2017 11:02 AM

## 2017-04-22 ENCOUNTER — Encounter: Payer: Self-pay | Admitting: Vascular Surgery

## 2017-04-29 ENCOUNTER — Ambulatory Visit (INDEPENDENT_AMBULATORY_CARE_PROVIDER_SITE_OTHER): Payer: Medicare Other | Admitting: Vascular Surgery

## 2017-04-29 ENCOUNTER — Encounter: Payer: Self-pay | Admitting: Vascular Surgery

## 2017-04-29 VITALS — BP 132/72 | HR 95 | Temp 97.6°F | Resp 16 | Ht 61.0 in | Wt 124.0 lb

## 2017-04-29 DIAGNOSIS — I739 Peripheral vascular disease, unspecified: Secondary | ICD-10-CM

## 2017-04-29 NOTE — Progress Notes (Signed)
Vascular and Vein Specialist of [facility]  Patient name: Carol Warren MRN: [id number] DOB: May 05, 1946 Sex: female  REASON FOR CONSULT: Discussion of recent CT angiogram for evaluation of lower extremity pain  HPI: Carol Warren is a 71 y.o. female, who is here today for evaluation of lower external knee pain. She has a very complex past medical history as outlined below. She reports pain in low back hip and buttocks in both lower extremities. This occurs with pain and with rest and she does not have typical lower extremity calf claudication symptoms. She has no history of lower from a tissue loss. She does have a history of prior aortic valve replacement and ascending aneurysm repair. Also has had AICD placement. Has a long history of cigarette smoking. She wears a back brace and has had prior spine surgery.  Past Medical History:  Diagnosis Date  . Adrenal tumor   . AICD (automatic cardioverter/defibrillator) present   . Anxiety   . Aortic aneurysm ([place])   . Asthma   . Bicuspid aortic valve    CONGENITAL  . Cancer ([place])    skin   . Carpal tunnel syndrome, bilateral   . Chronic depression   . Chronic fatigue   . Chronic sinusitis   . 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 COPD (chronic obstructive pulmonary disease) ([place])   . Coronary artery disease   . Degenerative disc disease    CERVICAL AND LUMBAR (DR. Lorin Mercy)  . GERD (gastroesophageal reflux disease)   . Hammer toe of right foot   . Headache   . Heart murmur   . Hepatomegaly   . HSV-1 (herpes simplex virus 1) infection   . HSV-2 (herpes simplex virus 2) infection   . Hypertension   . Insomnia   . Lumbar stenosis   . Osteoarthritis   . Osteopenia   . Pernicious anemia   . Plantar fasciitis   . Shortness of breath dyspnea    with exertion  . Smoker   . Venous insufficiency of left leg   . Vitamin B 12 deficiency     Family History  Problem Relation Age of Onset  .  Pancreatic cancer Mother 59  . Diabetes Father   . Hypertension Father   . Heart disease Father 37    CAD  . Breast cancer Sister   . Diabetes Maternal Grandmother   . Hypertension Maternal Grandmother   . Cancer Maternal Grandfather     colon or stomach  . Hypertension Paternal Grandmother   . Heart disease Paternal Grandmother     Later onset  . Diverticulosis Paternal Grandmother   . Asthma Grandchild     SOCIAL HISTORY: Social History   Social History  . Marital status: Married    Spouse name: N/A  . Number of children: 2  . Years of education: N/A   Occupational History  . retired Retired   Social History Main Topics  . Smoking status: Current Every Day Smoker    Packs/day: 0.50    Years: 50.00    Types: Cigarettes  . Smokeless tobacco: Never Used     Comment: uses vapor cig  . Alcohol use No  . Drug use: No  . Sexual activity: Not on file   Other Topics Concern  . Not on file   Social History Narrative  . No narrative on file    Allergies  Allergen Reactions  . No Known Allergies     Current  Outpatient Prescriptions  Medication Sig Dispense Refill  . albuterol (PROVENTIL HFA;VENTOLIN HFA) 108 (90 BASE) MCG/ACT inhaler Inhale 1-2 puffs into the lungs every 6 (six) hours as needed for wheezing or shortness of breath (depends on congestion if 1-2 puffs).     . albuterol (PROVENTIL) (2.5 MG/3ML) 0.083% nebulizer solution Take 2.5 mg by nebulization 2 (two) times daily as needed for wheezing.    . Artificial Tear Solution (GENTEAL TEARS OP) Apply 1 drop to eye 3 (three) times daily as needed (dry eyes).    [name] aspirin EC 325 MG EC tablet Take 1 tablet (325 mg total) by mouth daily. 30 tablet 0  . B Complex Vitamins (VITAMIN B COMPLEX PO) Take 1 tablet by mouth daily.     . Calcium Carbonate-Vitamin D (CALCIUM-D PO) Take 1 tablet by mouth daily.     . carboxymethylcellulose (REFRESH PLUS) 0.5 % SOLN Place 1 drop into both eyes as needed (dry eyes).    .  carvedilol (COREG) 12.5 MG tablet Take 1 tablet (12.5 mg total) by mouth 2 (two) times daily with a meal. 60 tablet 9  . cyanocobalamin (,VITAMIN B-12,) 1000 MCG/ML injection Inject 1,000 mcg into the muscle once a week. Sundays    . diazepam (VALIUM) 10 MG tablet Take 10 mg by mouth 3 (three) times daily as needed for anxiety.    Mariane Baumgarten Calcium (STOOL SOFTENER PO) Take 2 tablets by mouth daily as needed (constipation).    [name] FLUoxetine (PROZAC) 20 MG capsule Take 20 mg by mouth daily.    . fluticasone (FLONASE) 50 MCG/ACT nasal spray Place 1 spray into the nose 2 (two) times daily.     . furosemide (LASIX) 40 MG tablet Take 40 mg by mouth daily.    . Halobetasol-Ammonium Lactate 0.05 & 12 % (CREAM) KIT Apply 1 application topically daily as needed (dryness).    [name] ibuprofen (ADVIL,MOTRIN) 200 MG tablet Take 200 mg by mouth daily as needed for headache or mild pain.     [name] loratadine (CLARITIN) 10 MG tablet Take 10 mg by mouth daily.    [name] losartan (COZAAR) 50 MG tablet Take 50 mg by mouth daily.    . meloxicam (MOBIC) 15 MG tablet Take 15 mg by mouth daily.     . montelukast (SINGULAIR) 10 MG tablet Take 10 mg by mouth at bedtime.    . Multiple Vitamin (MULTIVITAMIN) capsule Take 1 capsule by mouth daily.    . Olopatadine HCl (PATANASE) 0.6 % SOLN Place into the nose.    [name] omeprazole (PRILOSEC) 20 MG capsule Take 40 mg by mouth daily.    . polyethylene glycol (MIRALAX / GLYCOLAX) packet Take 17 g by mouth daily as needed for mild constipation.    . Probiotic Product (PROBIOTIC DAILY PO) Take 1 capsule by mouth daily. Ultimate flora    . pseudoephedrine-guaifenesin (MUCINEX D) 60-600 MG per tablet Take 1 tablet by mouth daily as needed for congestion.     . ranitidine (ZANTAC) 300 MG capsule Take 300 mg by mouth every evening.    . rosuvastatin (CRESTOR) 5 MG tablet Take 5 mg by mouth daily.    . Sodium Chloride-Sodium Bicarb (CLASSIC NETI POT SINUS WASH NA) Place 2 sprays into the nose daily.       No current facility-administered medications for this visit.     REVIEW OF SYSTEMS:  _0  denotes positive finding, _1  denotes negative finding Cardiac  Comments:  Chest pain or chest pressure:  Shortness of breath upon exertion: x   Short of breath when lying flat:    Irregular heart rhythm:        Vascular    Pain in calf, thigh, or hip brought on by ambulation: x   Pain in feet at night that wakes you up from your sleep:     Blood clot in your veins:    Leg swelling:  x       Pulmonary    Oxygen at home:    Productive cough:  x   Wheezing:  x       Neurologic    Sudden weakness in arms or legs:     Sudden numbness in arms or legs:     Sudden onset of difficulty speaking or slurred speech:    Temporary loss of vision in one eye:     Problems with dizziness:         Gastrointestinal    Blood in stool:     Vomited blood:         Genitourinary    Burning when urinating:     Blood in urine:        Psychiatric    Major depression:  x       Hematologic    Bleeding problems:    Problems with blood clotting too easily:        Skin    Rashes or ulcers:        Constitutional    Fever or chills:      PHYSICAL EXAM: Vitals:   04/29/17 1011  BP: 132/72  Pulse: 95  Resp: 16  Temp: 97.6 F (36.4 C)  TempSrc: Oral  SpO2: 97%  Weight: 124 lb (56.2 kg)  Height: _0  (1.549 m)    GENERAL: The patient is a well-nourished female, in no acute distress. The vital signs are documented above. CARDIOVASCULAR: Plus radial pulses bilaterally. Carotid arteries without bruits bilaterally. She does have palpable femoral and popliteal pulses. I do not palpate pedal pulses PULMONARY: There is good air exchange  ABDOMEN: Soft and non-tender  MUSCULOSKELETAL: There are no major deformities or cyanosis. NEUROLOGIC: No focal weakness or paresthesias are detected. SKIN: There are no ulcers or rashes noted. PSYCHIATRIC: The patient has a normal affect.  DATA:  Did review  her CT images from [facility]. This revealed a great deal of irregularity in her aortoiliac segments but no evidence of flow-limiting stenosis. 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 Her deep femoral and superficial femoral arteries are proximally patent. She has stenosis at the adductor canal bilaterally without complete occlusion. She has a widely patent popliteal artery with some disease in her tibials bilaterally with in-line flow through her peroneal artery to her ankles bilaterally  MEDICAL ISSUES: Had long discussion with patient. I do not feel that her symptoms are related to her mild to moderate arterial insufficiency demonstrated by CT angiogram. When that her only symptoms related to arterial flow would be calves and she does not walk to the level of having discomfort. I do feel that her symptoms are most compatible with neurogenic pain possibly from her known spinal issues. She was relieved with this discussion.  There is no possibility for limb threatening ischemia at her current level flow. She will see [place] again on as-needed basis   Rosetta Posner, MD [facility] Vascular and Vein Specialists  of [facility] Tel [phone] Pager [phone]

## 2017-04-30 ENCOUNTER — Other Ambulatory Visit: Payer: Self-pay | Admitting: *Deleted

## 2017-04-30 MED ORDER — CARVEDILOL 12.5 MG PO TABS: 12.5000 mg | ORAL_TABLET | Freq: Two times a day (BID) | ORAL | 3 refills | Status: DC

## 2017-05-01 ENCOUNTER — Telehealth (INDEPENDENT_AMBULATORY_CARE_PROVIDER_SITE_OTHER): Payer: Self-pay | Admitting: Specialist

## 2017-05-01 NOTE — Telephone Encounter (Signed)
PT CALLED AND STATED THAT SHE WANTS TO TRY PAIN MANAGEMENT. STATED THAT THIS WAS DISCUSSED LAST VISIT.

## 2017-05-01 NOTE — Telephone Encounter (Signed)
PT CALLED AND STATED THAT SHE WANTS TO TRY PAIN MANAGEMENT. STATED THAT THIS WAS DISCUSSED LAST VISIT.  PLEASE ADVISE [id number]

## 2017-05-02 ENCOUNTER — Encounter: Payer: Self-pay | Admitting: Vascular Surgery

## 2017-05-02 ENCOUNTER — Other Ambulatory Visit (INDEPENDENT_AMBULATORY_CARE_PROVIDER_SITE_OTHER): Payer: Self-pay | Admitting: Specialist

## 2017-05-02 DIAGNOSIS — M5442 Lumbago with sciatica, left side: Principal | ICD-10-CM

## 2017-05-02 DIAGNOSIS — G8929 Other chronic pain: Secondary | ICD-10-CM

## 2017-05-02 NOTE — Telephone Encounter (Signed)
Referral for chronic pain management placed.

## 2017-05-05 ENCOUNTER — Encounter: Payer: Self-pay | Admitting: Internal Medicine

## 2017-05-05 NOTE — Progress Notes (Signed)
Pt is aware.  

## 2017-05-05 NOTE — Telephone Encounter (Signed)
I called and advised that we have put in referral and that it can take at least 6-8 weeks and it may take longer to get in to see someone, she understands.

## 2017-05-08 ENCOUNTER — Telehealth (INDEPENDENT_AMBULATORY_CARE_PROVIDER_SITE_OTHER): Payer: Self-pay | Admitting: Specialist

## 2017-05-08 NOTE — Telephone Encounter (Signed)
PT REQUESTS REFILL OF PAIN MEDS

## 2017-05-08 NOTE — Telephone Encounter (Signed)
PT REQUESTS REFILL OF PAIN MEDS.  [phone]

## 2017-05-09 ENCOUNTER — Other Ambulatory Visit (INDEPENDENT_AMBULATORY_CARE_PROVIDER_SITE_OTHER): Payer: Self-pay | Admitting: Specialist

## 2017-05-09 MED ORDER — TRAMADOL-ACETAMINOPHEN 37.5-325 MG PO TABS: 1.0000 | ORAL_TABLET | Freq: Three times a day (TID) | ORAL | 0 refills | Status: DC | PRN

## 2017-05-09 NOTE — Telephone Encounter (Signed)
Tramadol rx printed. jen

## 2017-05-12 NOTE — Progress Notes (Signed)
Called to [facility] 

## 2017-05-12 NOTE — Telephone Encounter (Signed)
I spoke with the patient and wants to know "WHY I CANNOT GET HYDROCODONE TILL I GET INTO PAIN MANAGEMENT?".  She states that she spoke to her cardio nurse this morning on the phone and she told her that she should be able to get it till they have her in pain management.  Carol Warren states that this weekend that she took her dogs tramadol because she was in so much pain.  She stated that she is tired of hurting and that she didn't want to keep living like this, that she would check herself into the hospital.  She doesn't understand why Dr. Louanne Skye is so afraid of the man that he can't give her the meds that she needs. She kept saying that her 29lb dog takes 1 tramadol a day and that she is only taking 2 a day, I advised that she can't compare the amount the dog is taking to the amount that she is taking as they are processed/broken differently in the body system. She said "He may just have to lose a patient". And she hung up the phone on me.  -----  She is requesting a call back from Dr. Louanne Skye

## 2017-05-12 NOTE — Telephone Encounter (Signed)
I tried to call pt and there was no answer-------Called rx to [facility]------I will try to call pt back later

## 2017-05-15 ENCOUNTER — Telehealth: Payer: Self-pay | Admitting: Cardiology

## 2017-05-15 NOTE — Telephone Encounter (Signed)
Returned call to patient.She stated she has gained 4.2 lbs in 7 days.Increased swelling in both lower legs.Stated all her joints ache.She saw PCP this week and lab was done to check for inflammation.Advised she can double lasix dose for the next 3 days only then return to normal dose.Advised to call back if she continues to have swelling and weight gain.

## 2017-05-15 NOTE — Telephone Encounter (Signed)
Agree with plan 

## 2017-05-15 NOTE — Telephone Encounter (Signed)
Returned call to [place] with [facility] no answer.[place].

## 2017-05-15 NOTE — Telephone Encounter (Signed)
New message     Pt has gain 4.2lb in 7 days , she is having swelling in left leg and abdomin , please advise pt next step to take

## 2017-05-15 NOTE — Telephone Encounter (Signed)
Follow up      Carol Warren at [facility] called to tell the nurse you do not have to return her call----please call the patient and address swelling and wt gain

## 2017-05-19 ENCOUNTER — Telehealth: Payer: Self-pay | Admitting: Cardiology

## 2017-05-19 ENCOUNTER — Ambulatory Visit (INDEPENDENT_AMBULATORY_CARE_PROVIDER_SITE_OTHER): Payer: Medicare Other | Admitting: *Deleted

## 2017-05-19 DIAGNOSIS — Z95810 Presence of automatic (implantable) cardiac defibrillator: Secondary | ICD-10-CM | POA: Diagnosis not present

## 2017-05-19 DIAGNOSIS — I428 Other cardiomyopathies: Secondary | ICD-10-CM

## 2017-05-19 DIAGNOSIS — I5022 Chronic systolic (congestive) heart failure: Secondary | ICD-10-CM

## 2017-05-19 NOTE — Telephone Encounter (Signed)
I don't recommend that she continue to take narcotics, Tramadol prescribed. jen

## 2017-05-19 NOTE — Progress Notes (Signed)
EPIC Encounter for ICM Monitoring  Patient Name: Carol Warren is a 71 y.o. female Date: 05/19/2017 Primary Care Physican: Ernestene Kiel, MD Primary Cardiologist:Hochrein Electrophysiologist: Lovena Le Dry Weight: 123 lbs  Bi-V Pacing:  95.9%       Heart Failure questions reviewed, pt asymptomatic at this time. Patient called Dr Hochrein's office, 5/17 and per epic note patient gained 4.2 lbs in 7 days with swelling in left leg and abdomen and was instructed to double Furosemide dosage x 3 days.  Per epic note today, patient's weight has decreased from 125.5 lbs to 123 lbs since Friday. She is now taking lasix 67m daily.   Thoracic impedance abnormal suggesting fluid accumulation starting 05/14/2017 but trending toward baseline and has already been addressed by Dr [place]osezella [place].  Prescribed and confirmed dosage: Furosemide 40 mg 1 tablet daily  Labs: 04/02/2017 Creatinine 1.26, BUN 20, Potassium 3.9, Sodium 134, EGFR 42-49 08/31/2016 Creatinine 0.71, BUN 5,   Potassium 3.5, Sodium 136, EGFR >60  08/23/2016 Creatinine 0.94, BUN 12, Potassium 4.1, Sodium 135, EGFR >60   Recommendations: No changes. Discussed to limit salt intake to 2000 mg/day and fluid intake to < 2 liters/day.  Encouraged to call for fluid symptoms or use local ER for any urgent symptoms.  Follow-up plan: ICM clinic phone appointment on 06/19/2017.  Office appointment scheduled on 08/04/2017 with Dr HPercival Spanish  Copy of ICM check sent to primary cardiologist and device physician.   3 month ICM trend: 05/19/2017   1 Year ICM trend:      LRosalene Billings RN 05/19/2017 11:31 AM

## 2017-05-19 NOTE — Telephone Encounter (Signed)
I called advised and I gave her the number for pain management ----she will call them and check the status of her info.  I also made her an appt to see dr. On 05/21/17.

## 2017-05-19 NOTE — Telephone Encounter (Signed)
New message     Pt states her weight went from 125.5 to 123, doubling the lasix

## 2017-05-19 NOTE — Telephone Encounter (Signed)
Returned call to patient. She reports her weight has decreased from 125.5 lbs to 123lbs since Friday. She is now taking lasix '40mg'$  daily. (dose increased per triage call 5/17)  She states she will call back if her weight climbs again.

## 2017-05-19 NOTE — Progress Notes (Signed)
Remote ICD transmission.   

## 2017-05-20 LAB — CUP PACEART REMOTE DEVICE CHECK
Battery Remaining Longevity: 39 mo
Battery Voltage: 2.96 V
Brady Statistic AP VP Percent: 0.7 %
Brady Statistic RA Percent Paced: 0.71 %
Brady Statistic RV Percent Paced: 95.9 %
HighPow Impedance: 60 Ohm
Implantable Lead Implant Date: 20160120
Implantable Lead Location: 753860
Implantable Lead Model: 4598
Implantable Lead Model: 5076
Lead Channel Impedance Value: 342 Ohm
Lead Channel Impedance Value: 399 Ohm
Lead Channel Impedance Value: 399 Ohm
Lead Channel Impedance Value: 475 Ohm
Lead Channel Impedance Value: 551 Ohm
Lead Channel Impedance Value: 608 Ohm
Lead Channel Impedance Value: 608 Ohm
Lead Channel Impedance Value: 646 Ohm
Lead Channel Pacing Threshold Amplitude: 0.625 V
Lead Channel Sensing Intrinsic Amplitude: 1.125 mV
Lead Channel Sensing Intrinsic Amplitude: 1.125 mV
Lead Channel Sensing Intrinsic Amplitude: 9.125 mV
Lead Channel Sensing Intrinsic Amplitude: 9.125 mV
Lead Channel Setting Pacing Amplitude: 3 V
Lead Channel Setting Pacing Pulse Width: 0.4 ms
Lead Channel Setting Pacing Pulse Width: 0.4 ms
MDC IDC LEAD IMPLANT DT: 20160120
MDC IDC LEAD IMPLANT DT: 20160120
MDC IDC LEAD LOCATION: 753858
MDC IDC LEAD LOCATION: 753859
MDC IDC MSMT LEADCHNL LV IMPEDANCE VALUE: 418 Ohm
MDC IDC MSMT LEADCHNL LV IMPEDANCE VALUE: 779 Ohm
MDC IDC MSMT LEADCHNL LV IMPEDANCE VALUE: 836 Ohm
MDC IDC MSMT LEADCHNL LV IMPEDANCE VALUE: 874 Ohm
MDC IDC MSMT LEADCHNL LV PACING THRESHOLD AMPLITUDE: 1.75 V
MDC IDC MSMT LEADCHNL LV PACING THRESHOLD PULSEWIDTH: 0.4 ms
MDC IDC MSMT LEADCHNL RA PACING THRESHOLD AMPLITUDE: 0.375 V
MDC IDC MSMT LEADCHNL RA PACING THRESHOLD PULSEWIDTH: 0.4 ms
MDC IDC MSMT LEADCHNL RV IMPEDANCE VALUE: 513 Ohm
MDC IDC MSMT LEADCHNL RV PACING THRESHOLD PULSEWIDTH: 0.4 ms
MDC IDC PG IMPLANT DT: 20160120
MDC IDC SESS DTM: 20180521083822
MDC IDC SET LEADCHNL RA PACING AMPLITUDE: 1.5 V
MDC IDC SET LEADCHNL RV PACING AMPLITUDE: 2 V
MDC IDC SET LEADCHNL RV SENSING SENSITIVITY: 0.45 mV
MDC IDC STAT BRADY AP VS PERCENT: 0.01 %
MDC IDC STAT BRADY AS VP PERCENT: 97.75 %
MDC IDC STAT BRADY AS VS PERCENT: 1.55 %

## 2017-05-21 ENCOUNTER — Ambulatory Visit (INDEPENDENT_AMBULATORY_CARE_PROVIDER_SITE_OTHER): Payer: Medicare Other | Admitting: Specialist

## 2017-05-21 ENCOUNTER — Encounter: Payer: Self-pay | Admitting: Cardiology

## 2017-05-22 ENCOUNTER — Other Ambulatory Visit (INDEPENDENT_AMBULATORY_CARE_PROVIDER_SITE_OTHER): Payer: Self-pay | Admitting: Specialist

## 2017-05-22 NOTE — Telephone Encounter (Signed)
Not my patient

## 2017-05-23 ENCOUNTER — Other Ambulatory Visit (INDEPENDENT_AMBULATORY_CARE_PROVIDER_SITE_OTHER): Payer: Self-pay | Admitting: Radiology

## 2017-05-23 MED ORDER — TRAMADOL-ACETAMINOPHEN 37.5-325 MG PO TABS: 1.0000 | ORAL_TABLET | Freq: Three times a day (TID) | ORAL | 0 refills | Status: DC | PRN

## 2017-05-23 NOTE — Telephone Encounter (Signed)
rx called to [facility], patient advised by Fleeta Emmer

## 2017-06-02 ENCOUNTER — Encounter: Payer: Self-pay | Admitting: Cardiology

## 2017-06-03 ENCOUNTER — Other Ambulatory Visit (INDEPENDENT_AMBULATORY_CARE_PROVIDER_SITE_OTHER): Payer: Self-pay | Admitting: Specialist

## 2017-06-03 ENCOUNTER — Encounter (INDEPENDENT_AMBULATORY_CARE_PROVIDER_SITE_OTHER): Payer: Self-pay | Admitting: Specialist

## 2017-06-04 ENCOUNTER — Encounter: Payer: Self-pay | Admitting: Cardiology

## 2017-06-04 MED ORDER — TRAMADOL-ACETAMINOPHEN 37.5-325 MG PO TABS: 1.0000 | ORAL_TABLET | Freq: Three times a day (TID) | ORAL | 0 refills | Status: DC | PRN

## 2017-06-16 ENCOUNTER — Other Ambulatory Visit (INDEPENDENT_AMBULATORY_CARE_PROVIDER_SITE_OTHER): Payer: Self-pay | Admitting: Specialist

## 2017-06-16 NOTE — Telephone Encounter (Signed)
ultracet refill request

## 2017-06-17 NOTE — Telephone Encounter (Signed)
I called and advised rx has been called to the pharmacy

## 2017-06-19 ENCOUNTER — Ambulatory Visit (INDEPENDENT_AMBULATORY_CARE_PROVIDER_SITE_OTHER): Payer: Medicare Other

## 2017-06-19 DIAGNOSIS — I5022 Chronic systolic (congestive) heart failure: Secondary | ICD-10-CM

## 2017-06-19 DIAGNOSIS — Z95810 Presence of automatic (implantable) cardiac defibrillator: Secondary | ICD-10-CM | POA: Diagnosis not present

## 2017-06-19 NOTE — Progress Notes (Signed)
EPIC Encounter for ICM Monitoring  Patient Name: Carol Warren is a 71 y.o. female Date: 06/19/2017 Primary Care Physican: Ernestene Kiel, MD Primary Cardiologist:Hochrein Electrophysiologist: Lovena Le Dry Weight:  121 lbs  Bi-V Pacing:  96.5%            Heart Failure questions reviewed, pt asymptomatic.   Thoracic impedance normal.  Prescribed and confirmed dosage: Furosemide 40 mg 1 tablet daily.  Patient report PCP has stopped Furosemide 3 weeks ago (approximately 05/28/2017) due to low sodium levels.   Labs: 04/02/2017 Creatinine 1.26, BUN 20, Potassium 3.9, Sodium 134, EGFR 42-49 08/31/2016 Creatinine 0.71, BUN 5,   Potassium 3.5, Sodium 136, EGFR >60  08/23/2016 Creatinine 0.94, BUN 12, Potassium 4.1, Sodium 135, EGFR >60   Recommendations: No changes.  Advised to limit salt intake to 2000 mg/day and fluid intake to < 2 liters/day.  Encouraged to call for fluid symptoms.  Follow-up plan: ICM clinic phone appointment on 07/22/2017.  Office appointment scheduled on 08/04/2017 with Dr. Percival Spanish.  Copy of ICM check sent to device physician.   3 month ICM trend: 06/19/2017   1 Year ICM trend:      Rosalene Billings, RN 06/19/2017 3:28 PM

## 2017-06-23 ENCOUNTER — Ambulatory Visit (INDEPENDENT_AMBULATORY_CARE_PROVIDER_SITE_OTHER): Payer: Medicare Other | Admitting: Specialist

## 2017-06-23 ENCOUNTER — Encounter (INDEPENDENT_AMBULATORY_CARE_PROVIDER_SITE_OTHER): Payer: Self-pay | Admitting: Specialist

## 2017-06-23 ENCOUNTER — Ambulatory Visit (INDEPENDENT_AMBULATORY_CARE_PROVIDER_SITE_OTHER): Payer: Medicare Other

## 2017-06-23 VITALS — BP 144/79 | HR 93 | Ht 61.0 in | Wt 120.0 lb

## 2017-06-23 DIAGNOSIS — Z981 Arthrodesis status: Secondary | ICD-10-CM

## 2017-06-23 MED ORDER — TRAMADOL-ACETAMINOPHEN 37.5-325 MG PO TABS: 1.0000 | ORAL_TABLET | Freq: Three times a day (TID) | ORAL | 0 refills | Status: DC | PRN

## 2017-06-23 NOTE — Progress Notes (Deleted)
Office Visit Note   Patient: Carol Warren           Date of Birth: [birth year]           MRN: [id number] Visit Date: 06/23/2017              Requested by: Ernestene Kiel, MD [address] [place], [place] [address] PCP: Ernestene Kiel, MD   Assessment & Plan: Visit Diagnoses:  1. History of lumbar fusion     Plan: ***  Follow-Up Instructions: No Follow-up on file.   Orders:  Orders Placed This Encounter  Procedures  . XR Lumbar Spine 2-3 Views   No orders of the defined types were placed in this encounter.     Procedures: No procedures performed   Clinical Data: No additional findings.   Subjective: Chief Complaint  Patient presents with  . Lower Back - Follow-up    71 year old female s/p lumbar fusion L3-4 and L4-5 08/2017 with cages and mPACT screws and rods. She still has pain and takes ultram but would prefer the hydrocodone as she feels like it relieves her pain better. She has been using the external bone stimulator and supplementing her diet with calcium and vitamin D.  Calcium citrate and Vitamin D 3. Her husband has had 3 shoulder surgeries and unfortunately she feels a need to perform her usual chores at home. She is wearing her brace continuously 5 AM to 6 PM. Last CT was done as part of a CT angiogram of the Aorta showed the bone graft and hardware to be adequate. No bowel or bladder difficulties. The ultram is taken about 3 per day.    Review of Systems  HENT: Negative.   Eyes: Negative.   Respiratory: Positive for chest tightness and shortness of breath.   Cardiovascular: Positive for leg swelling.  Gastrointestinal: Negative.  Negative for abdominal pain, blood in stool, diarrhea, nausea, rectal pain and vomiting.  Endocrine: Negative.   Genitourinary: Negative.   Musculoskeletal: Positive for back pain.  Skin: Negative.  Negative for pallor, rash and wound.  Allergic/Immunologic: Negative.   Neurological: Positive for weakness and numbness.  Negative for dizziness, tremors, seizures, syncope, facial asymmetry, speech difficulty, light-headedness and headaches.  Hematological: Negative.  Negative for adenopathy. Does not bruise/bleed easily.  Psychiatric/Behavioral: Negative.  Negative for agitation, behavioral problems, confusion, decreased concentration, dysphoric mood, hallucinations, self-injury, sleep disturbance and suicidal ideas. The patient is not nervous/anxious and is not hyperactive.      Objective: Vital Signs: BP (!) 144/79 (BP Location: Left Arm, Patient Position: Sitting)   Pulse 93   Ht 5\' 1"  (1.549 m)   Wt 120 lb (54.4 kg)   BMI 22.67 kg/m   Physical Exam  Constitutional: She is oriented to person, place, and time. She appears well-developed and well-nourished.  HENT:  Head: Normocephalic and atraumatic.  Eyes: EOM are normal. Pupils are equal, round, and reactive to light.  Neck: Normal range of motion. Neck supple.  Pulmonary/Chest: Effort normal and breath sounds normal.  Abdominal: Soft. Bowel sounds are normal.  Neurological: She is alert and oriented to person, place, and time.  Skin: Skin is warm and dry.  Psychiatric: She has a normal mood and affect. Her behavior is normal. Judgment and thought content normal.    Back Exam   Tenderness  The patient is experiencing tenderness in the lumbar.  Range of Motion  Extension: abnormal  Flexion: abnormal  Lateral Bend Right: abnormal  Lateral Bend Left:  abnormal  Rotation Right: abnormal  Rotation Left: abnormal   Muscle Strength  Right Quadriceps:  5/5  Left Quadriceps:  5/5  Right Hamstrings:  5/5  Left Hamstrings:  5/5   Tests  Straight leg raise right: negative Straight leg raise left: negative  Reflexes  Patellar: normal Achilles: normal Babinski's sign: normal   Other  Toe Walk: normal Heel Walk: normal      Specialty Comments:  No specialty comments available.  Imaging: No results found.   PMFS  History: Patient Active Problem List   Diagnosis Date Noted  . Spondylolisthesis of lumbar region 08/30/2016    Priority: High    Class: Chronic  . DDD (degenerative disc disease), lumbar 08/30/2016    Priority: High  . Spinal stenosis, lumbar region, with neurogenic claudication 08/30/2016    Priority: High    Class: Chronic  . Mucoid cyst of joint 11/20/2016  . Osteoarthritis of finger of right hand 11/20/2016  . Lumbar degenerative disc disease 08/30/2016  . Dizziness 06/12/2015  . ICD (implantable cardioverter-defibrillator), biventricular, in situ 02/16/2015  . Chronic systolic heart failure ([place]) 02/16/2015  . Nonischemic cardiomyopathy ([place]) 01/19/2015  . Anemia, secondary to surgery 01/18/2015  . B12 deficiency 01/18/2015  . CHB (complete heart block)- post op 01/18/2015  . 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 Severe aortic stenosis 11/14/2014  . Pulmonary hypertension due to left ventricular systolic dysfunction ([place]) 11/09/2014  . Personal history of colonic polyps 07/30/2013  . COPD GOLD 0/ still smoking  07/07/2013  . CIS (carcinoma in situ)   . Osteopenia   . HSV-1 (herpes simplex virus 1) infection   . HSV-2 (herpes simplex virus 2) infection   . Bicuspid aortic valve   . Aortic aneurysm ([place])   . Adrenal tumor   . Degenerative disc disease   . Smoker    Past Medical History:  Diagnosis Date  . Adrenal tumor   . AICD (automatic cardioverter/defibrillator) present   . Anxiety   . Aortic aneurysm ([place])   . Asthma   . Bicuspid aortic valve    CONGENITAL  . Cancer ([place])    skin   . Carpal tunnel syndrome, bilateral   . Chronic depression   . Chronic fatigue   . Chronic sinusitis   . 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 COPD (chronic obstructive pulmonary disease) ([place])   . Coronary artery disease   . Degenerative disc disease    CERVICAL AND LUMBAR (DR. Lorin Mercy)  . GERD (gastroesophageal  reflux disease)   . Hammer toe of right foot   . Headache   . Heart murmur   . Hepatomegaly   . HSV-1 (herpes simplex virus 1) infection   . HSV-2 (herpes simplex virus 2) infection   . Hypertension   . Insomnia   . Lumbar stenosis   . Osteoarthritis   . Osteopenia   . Pernicious anemia   . Plantar fasciitis   . Shortness of breath dyspnea    with exertion  . Smoker   . Venous insufficiency of left leg   . Vitamin B 12 deficiency     Family History  Problem Relation Age of Onset  . Pancreatic cancer Mother 54  . Diabetes Father   . Hypertension Father   . Heart disease Father 25       CAD  . Breast cancer Sister   . Diabetes Maternal Grandmother   . Hypertension Maternal Grandmother   .  Cancer Maternal Grandfather        colon or stomach  . Hypertension Paternal Grandmother   . Heart disease Paternal Grandmother        Later onset  . Diverticulosis Paternal Grandmother   . Asthma Grandchild     Past Surgical History:  Procedure Laterality Date  . AORTIC VALVE REPLACEMENT N/A 01/12/2015   Procedure: AORTIC VALVE REPLACEMENT (AVR);  Surgeon: Gaye Pollack, MD;  Location: [facility];  Service: Open Heart Surgery;  Laterality: N/A;  . ASCENDING AORTIC ROOT REPLACEMENT N/A 01/12/2015   Procedure: ASCENDING AORTIC ROOT REPLACEMENT;  Surgeon: Gaye Pollack, MD;  Location: [facility];  Service: Open Heart Surgery;  Laterality: N/A;  . BI-VENTRICULAR PACEMAKER INSERTION N/A 01/18/2015   Procedure: BI-VENTRICULAR PACEMAKER INSERTION (CRT-P);  Surgeon: Evans Lance, MD;  [facility] LAB;  Service: Cardiovascular;  Laterality: N/A;  . BUNIONECTOMY WITH HAMMERTOE RECONSTRUCTION Right   . CARPAL TUNNEL RELEASE Bilateral 2008  . CATARACT EXTRACTION Bilateral    X2  . CATARACT EXTRACTION W/ INTRAOCULAR LENS  IMPLANT, BILATERAL    . COLONOSCOPY    . CYST EXCISION     right side of throat  . EXCISION OF BASAL CELL CA     SKIN   . EXCISION OF VULVAR CIS    . EYE SURGERY    .  FUNCTIONAL ENDOSCOPIC SINUS SURGERY    . LEFT AND RIGHT HEART CATHETERIZATION WITH CORONARY ANGIOGRAM N/A 09/23/2014   Procedure: LEFT AND RIGHT HEART CATHETERIZATION WITH CORONARY ANGIOGRAM;  Surgeon: Minus Breeding, MD;  Location[facility] CATH LAB;  Service: Cardiovascular;  Laterality: N/A;  . MASS EXCISION Right 04/03/2017   Procedure: EXCISION CYST DEBRIDMENT DISTAL INTERPHALANEAL RIGHT INDEX FINGER;  Surgeon: Daryll Brod, MD;  Location: [facility];  Service: Orthopedics;  Laterality: Right;  REG/FAB  . MULTIPLE TOOTH EXTRACTIONS    . SINUS PROCEDURE  2009   x 6  . TEE WITHOUT CARDIOVERSION N/A 10/06/2014   Procedure: TRANSESOPHAGEAL ECHOCARDIOGRAM (TEE);  Surgeon: Sueanne Margarita, MD;  Location: [facility] ENDOSCOPY;  Service: Cardiovascular;  Laterality: N/A;  . TEE WITHOUT CARDIOVERSION N/A 01/12/2015   Procedure: TRANSESOPHAGEAL ECHOCARDIOGRAM (TEE);  Surgeon: Gaye Pollack, MD;  Location: [facility];  Service: Open Heart Surgery;  Laterality: N/A;  . UPPER GASTROINTESTINAL ENDOSCOPY     Social History   Occupational History  . retired Retired   Social History Main Topics  . Smoking status: Current Every Day Smoker    Packs/day: 0.50    Years: 50.00    Types: Cigarettes  . Smokeless tobacco: Never Used     Comment: uses vapor cig  . Alcohol use No  . Drug use: No  . Sexual activity: Not on file

## 2017-06-23 NOTE — Patient Instructions (Addendum)
Avoid frequent bending and stooping  No lifting greater than 10 lbs. May use ice or moist heat for pain. Weight loss is of benefit. Handicap license is approved. Obtain CT to evaluate and assess healing of her lumbar fusion, if healed start PT at [facility] . Continue tramadol. Pain Management consult.

## 2017-06-23 NOTE — Progress Notes (Signed)
Office Visit Note   Patient: Carol Warren           Date of Birth: Feb 19, 1946           MRN: [id number] Visit Date: 06/23/2017              Requested by: Carol Kiel, MD [address] [place], [place] [address] PCP: Carol Kiel, MD   Assessment & Plan: Visit Diagnoses:  1. History of lumbar fusion   2. Status post lumbar spinal fusion     Plan: Avoid frequent bending and stooping  No lifting greater than 10 lbs. May use ice or moist heat for pain. Weight loss is of benefit. Handicap license is approved. Obtain CT to evaluate and assess healing of her lumbar fusion, if healed start PT at [facility] in [facility] . Continue tramadol. Pain Management consult.   Follow-Up Instructions: Return in about 4 weeks (around 07/21/2017).   Orders:  Orders Placed This Encounter  Procedures  . XR Lumbar Spine 2-3 Views  . CT LUMBAR SPINE WO CONTRAST  . Ambulatory referral to Physical Medicine Rehab   Meds ordered this encounter  Medications  . traMADol-acetaminophen (ULTRACET) 37.5-325 MG tablet    Sig: Take 1 tablet by mouth every 8 (eight) hours as needed.    Dispense:  60 tablet    Refill:  0      Procedures: No procedures performed   Clinical Data: No additional findings.   Subjective: Chief Complaint  Patient presents with  . Lower Back - Follow-up    71 year old female with history of lumbar fusion 08/2016 L3-4 and L4-5 for mechanical back pain and spondylolisthesis dynamically at L3-4. She has a history of ASPVD, has seen Dr. Donnetta Warren and been told that she has only mild changes and surgery is not indicated. No bowel or bladder difficulty. She reports persisting back and leg discomfort. Her husband has completed his third surgery for his rotator cuff and she expects herself to be Returning to her usual house chores. She has pain that she describes as severe and the ultracet is not as helpful as the hydrocodone in the past for relieving her pain. I have  told her that we need to stop the hydrocodone. I have continued with ultracet but I am concerned that she still complains of back pain. She has been wear her  L-S brace full time from 5AM to 6 PM and using a bone growth stimulator. Last CT of the lumbar spine was a  CT angiogram of the abdomen in 02/2017.     Review of Systems  Constitutional: Negative for activity change, appetite change, chills, diaphoresis, fatigue, fever and unexpected weight change.  HENT: Negative.   Eyes: Negative.   Respiratory: Negative.   Cardiovascular: Positive for leg swelling.  Gastrointestinal: Negative for abdominal pain, constipation, diarrhea, nausea, rectal pain and vomiting.  Endocrine: Negative.   Genitourinary: Negative.  Negative for difficulty urinating, dyspareunia, dysuria and flank pain.  Musculoskeletal: Positive for back pain, gait problem and myalgias.  Skin: Negative.   Allergic/Immunologic: Negative.   Neurological: Positive for syncope, weakness and numbness. Negative for dizziness, tremors, seizures, facial asymmetry, speech difficulty, light-headedness and headaches.  Hematological: Negative.  Negative for adenopathy. Does not bruise/bleed easily.  Psychiatric/Behavioral: Positive for decreased concentration. Negative for behavioral problems, self-injury, sleep disturbance and suicidal ideas. The patient is nervous/anxious.      Objective: Vital Signs: BP (!) 144/79 (BP Location: Left Arm, Patient Position: Sitting)  Pulse 93   Ht 5\' 1"  (1.549 m)   Wt 120 lb (54.4 kg)   BMI 22.67 kg/m   Physical Exam  Constitutional: She is oriented to person, place, and time. She appears well-developed and well-nourished.  HENT:  Head: Normocephalic and atraumatic.  Eyes: EOM are normal. Pupils are equal, round, and reactive to light.  Neck: Normal range of motion. Neck supple.  Pulmonary/Chest: Effort normal and breath sounds normal.  Abdominal: Soft. Bowel sounds are normal.    Neurological: She is alert and oriented to person, place, and time.  Skin: Skin is warm and dry.  Psychiatric: She has a normal mood and affect. Her behavior is normal. Judgment and thought content normal.    Back Exam   Tenderness  The patient is experiencing tenderness in the lumbar.  Range of Motion  Extension: normal  Flexion: normal  Lateral Bend Right: abnormal  Lateral Bend Left: abnormal  Rotation Right: abnormal  Rotation Left: abnormal   Muscle Strength  Right Quadriceps:  5/5  Left Quadriceps:  5/5  Right Hamstrings:  5/5  Left Hamstrings:  5/5   Tests  Straight leg raise right: negative Straight leg raise left: negative  Reflexes  Patellar: normal Achilles: normal Babinski's sign: normal   Other  Toe Walk: normal Heel Walk: normal Sensation: normal Gait: normal       Specialty Comments:  No specialty comments available.  Imaging: Xr Lumbar Spine 2-3 Views  Result Date: 06/23/2017 AP and lateral flexion and extension radiographs with 3-4 mm of motion measured over the anterior vertebral bodies L3 to L5. The cages remain in place with the posterior marker at the L4-5 disc space at the posterior margin of the disc space. The screw alignment appears to change with flexion and extension and may be a cause for some difference of the measured distances. The lowest screws on the AP view with questionable lucency.     PMFS History: Patient Active Problem List   Diagnosis Date Noted  . Spondylolisthesis of lumbar region 08/30/2016    Priority: High    Class: Chronic  . DDD (degenerative disc disease), lumbar 08/30/2016    Priority: High  . Spinal stenosis, lumbar region, with neurogenic claudication 08/30/2016    Priority: High    Class: Chronic  . Mucoid cyst of joint 11/20/2016  . Osteoarthritis of finger of right hand 11/20/2016  . Lumbar degenerative disc disease 08/30/2016  . Dizziness 06/12/2015  . ICD (implantable  cardioverter-defibrillator), biventricular, in situ 02/16/2015  . Chronic systolic heart failure ([place]) 02/16/2015  . Nonischemic cardiomyopathy ([place]) 01/19/2015  . Anemia, secondary to surgery 01/18/2015  . B12 deficiency 01/18/2015  . CHB (complete heart block)- post op 01/18/2015  . 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 Severe aortic stenosis 11/14/2014  . Pulmonary hypertension due to left ventricular systolic dysfunction ([place]) 11/09/2014  . Personal history of colonic polyps 07/30/2013  . COPD GOLD 0/ still smoking  07/07/2013  . CIS (carcinoma in situ)   . Osteopenia   . HSV-1 (herpes simplex virus 1) infection   . HSV-2 (herpes simplex virus 2) infection   . Bicuspid aortic valve   . Aortic aneurysm ([place])   . Adrenal tumor   . Degenerative disc disease   . Smoker    Past Medical History:  Diagnosis Date  . Adrenal tumor   . AICD (automatic cardioverter/defibrillator) present   . Anxiety   . Aortic aneurysm ([place])   .  Asthma   . Bicuspid aortic valve    CONGENITAL  . Cancer ([place])    skin   . Carpal tunnel syndrome, bilateral   . Chronic depression   . Chronic fatigue   . Chronic sinusitis   . 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 COPD (chronic obstructive pulmonary disease) ([place])   . Coronary artery disease   . Degenerative disc disease    CERVICAL AND LUMBAR (DR. Lorin Mercy)  . GERD (gastroesophageal reflux disease)   . Hammer toe of right foot   . Headache   . Heart murmur   . Hepatomegaly   . HSV-1 (herpes simplex virus 1) infection   . HSV-2 (herpes simplex virus 2) infection   . Hypertension   . Insomnia   . Lumbar stenosis   . Osteoarthritis   . Osteopenia   . Pernicious anemia   . Plantar fasciitis   . Shortness of breath dyspnea    with exertion  . Smoker   . Venous insufficiency of left leg   . Vitamin B 12 deficiency     Family History  Problem Relation Age of Onset  . Pancreatic cancer  Mother 64  . Diabetes Father   . Hypertension Father   . Heart disease Father 8       CAD  . Breast cancer Sister   . Diabetes Maternal Grandmother   . Hypertension Maternal Grandmother   . Cancer Maternal Grandfather        colon or stomach  . Hypertension Paternal Grandmother   . Heart disease Paternal Grandmother        Later onset  . Diverticulosis Paternal Grandmother   . Asthma Grandchild     Past Surgical History:  Procedure Laterality Date  . AORTIC VALVE REPLACEMENT N/A 01/12/2015   Procedure: AORTIC VALVE REPLACEMENT (AVR);  Surgeon: Gaye Pollack, MD;  Location: [facility];  Service: Open Heart Surgery;  Laterality: N/A;  . ASCENDING AORTIC ROOT REPLACEMENT N/A 01/12/2015   Procedure: ASCENDING AORTIC ROOT REPLACEMENT;  Surgeon: Gaye Pollack, MD;  Location: [facility];  Service: Open Heart Surgery;  Laterality: N/A;  . BI-VENTRICULAR PACEMAKER INSERTION N/A 01/18/2015   Procedure: BI-VENTRICULAR PACEMAKER INSERTION (CRT-P);  Surgeon: Evans Lance, MD;  Location: [facility] CATH LAB;  Service: Cardiovascular;  Laterality: N/A;  . BUNIONECTOMY WITH HAMMERTOE RECONSTRUCTION Right   . CARPAL TUNNEL RELEASE Bilateral 2008  . CATARACT EXTRACTION Bilateral    X2  . CATARACT EXTRACTION W/ INTRAOCULAR LENS  IMPLANT, BILATERAL    . COLONOSCOPY    . CYST EXCISION     right side of throat  . EXCISION OF BASAL CELL CA     SKIN   . EXCISION OF VULVAR CIS    . EYE SURGERY    . FUNCTIONAL ENDOSCOPIC SINUS SURGERY    . LEFT AND RIGHT HEART CATHETERIZATION WITH CORONARY ANGIOGRAM N/A 09/23/2014   Procedure: LEFT AND RIGHT HEART CATHETERIZATION WITH CORONARY ANGIOGRAM;  Surgeon: Minus Breeding, MD;  Location: [facility] CATH LAB;  Service: Cardiovascular;  Laterality: N/A;  . MASS EXCISION Right 04/03/2017   Procedure: EXCISION CYST DEBRIDMENT DISTAL INTERPHALANEAL RIGHT INDEX FINGER;  Surgeon: Daryll Brod, MD;  Location: [facility];  Service: Orthopedics;  Laterality: Right;  REG/FAB  . MULTIPLE TOOTH EXTRACTIONS     . SINUS PROCEDURE  2009   x 6  . TEE WITHOUT CARDIOVERSION N/A 10/06/2014   Procedure: TRANSESOPHAGEAL ECHOCARDIOGRAM (TEE);  Surgeon: Sueanne Margarita, MD;  Location: [facility]  ENDOSCOPY;  Service: Cardiovascular;  Laterality: N/A;  . TEE WITHOUT CARDIOVERSION N/A 01/12/2015   Procedure: TRANSESOPHAGEAL ECHOCARDIOGRAM (TEE);  Surgeon: Gaye Pollack, MD;  Location: [place];  Service: Open Heart Surgery;  Laterality: N/A;  . UPPER GASTROINTESTINAL ENDOSCOPY     Social History   Occupational History  . retired Retired   Social History Main Topics  . Smoking status: Current Every Day Smoker    Packs/day: 0.50    Years: 50.00    Types: Cigarettes  . Smokeless tobacco: Never Used     Comment: uses vapor cig  . Alcohol use No  . Drug use: No  . Sexual activity: Not on file

## 2017-06-26 ENCOUNTER — Encounter: Payer: Self-pay | Admitting: Specialist

## 2017-07-16 ENCOUNTER — Telehealth (INDEPENDENT_AMBULATORY_CARE_PROVIDER_SITE_OTHER): Payer: Self-pay | Admitting: Specialist

## 2017-07-16 NOTE — Telephone Encounter (Signed)
error 

## 2017-07-17 ENCOUNTER — Ambulatory Visit (INDEPENDENT_AMBULATORY_CARE_PROVIDER_SITE_OTHER): Payer: Medicare Other | Admitting: Specialist

## 2017-07-17 ENCOUNTER — Encounter (INDEPENDENT_AMBULATORY_CARE_PROVIDER_SITE_OTHER): Payer: Self-pay | Admitting: Specialist

## 2017-07-17 VITALS — BP 169/78 | Ht 61.0 in | Wt 120.0 lb

## 2017-07-17 DIAGNOSIS — M5137 Other intervertebral disc degeneration, lumbosacral region: Secondary | ICD-10-CM | POA: Diagnosis not present

## 2017-07-17 DIAGNOSIS — Z981 Arthrodesis status: Secondary | ICD-10-CM

## 2017-07-17 DIAGNOSIS — M4326 Fusion of spine, lumbar region: Secondary | ICD-10-CM | POA: Diagnosis not present

## 2017-07-17 DIAGNOSIS — M51379 Other intervertebral disc degeneration, lumbosacral region without mention of lumbar back pain or lower extremity pain: Secondary | ICD-10-CM

## 2017-07-17 MED ORDER — TRAMADOL-ACETAMINOPHEN 37.5-325 MG PO TABS: 1.0000 | ORAL_TABLET | Freq: Three times a day (TID) | ORAL | 0 refills | Status: DC | PRN

## 2017-07-17 NOTE — Addendum Note (Signed)
Addended by: Basil Dess on: 07/17/2017 04:15 PM   Modules accepted: Orders

## 2017-07-17 NOTE — Progress Notes (Signed)
Office Visit Note   Patient: Carol Warren           Date of Birth: Jan 10, 1946           MRN: [id number] Visit Date: 07/17/2017              Requested by: Ernestene Kiel, MD [address] [place], [place] [address] PCP: Ernestene Kiel, MD   Assessment & Plan: Visit Diagnoses:  1. Disc disease, degenerative, lumbar or lumbosacral   2. Fusion of spine of lumbar region     Plan:Avoid frequent bending and stooping  No lifting greater than 10 lbs. May use ice or moist heat for pain. Weight loss is of benefit. Handicap license is approved. Take Vitamin D supplements, calcium 200 mg per serving of dairy product daily requirement is 1500 mg per day. The CT scan is optimistic that fusion may still occur. The L3-4 level appears to have healed and the L4-5 level is showing areas of spot welding of the bone graft to the endplates. Recommend return in 2 month ad recheck of lumbar rexray for motion.   Follow-Up Instructions: Return in about 8 months (around 03/17/2018).   Orders:  No orders of the defined types were placed in this encounter.  No orders of the defined types were placed in this encounter.     Procedures: No procedures performed   Clinical Data: No additional findings.   Subjective: Chief Complaint  Patient presents with  . Lower Back - Follow-up    71 year old female status post L3-4 and L4-5 TLIFs in 08/2016. Underwent repeat CT scan of the lumbar spine that shows probable union of the L3-4 interbody fusion and no clear fusion at L4-5. The study shows areas of cysts within bone adjacent to the cage and vertebral body at L4-5 disc space. She continues to smoke but reports to be cutting back to 5 cigarettes per day.     Review of Systems  Constitutional: Negative for activity change, appetite change and unexpected weight change.  HENT: Positive for rhinorrhea, sinus pain, sinus pressure and sneezing.   Eyes: Negative.   Respiratory: Positive for cough and  wheezing.   Cardiovascular: Negative.  Negative for chest pain, palpitations and leg swelling.  Gastrointestinal: Negative.  Negative for diarrhea, nausea, rectal pain and vomiting.  Endocrine: Negative.   Genitourinary: Negative.  Negative for dyspareunia, dysuria, enuresis, flank pain and frequency.  Musculoskeletal: Positive for back pain. Negative for arthralgias, gait problem, joint swelling, myalgias, neck pain and neck stiffness.  Skin: Negative.  Negative for color change, pallor, rash and wound.  Allergic/Immunologic: Negative.  Negative for environmental allergies, food allergies and immunocompromised state.  Neurological: Positive for weakness and numbness.  Hematological: Negative.  Negative for adenopathy. Does not bruise/bleed easily.  Psychiatric/Behavioral: Negative.  Negative for agitation, behavioral problems, confusion, decreased concentration, dysphoric mood, hallucinations, self-injury, sleep disturbance and suicidal ideas. The patient is not nervous/anxious and is not hyperactive.      Objective: Vital Signs: BP (!) 169/78 (BP Location: Left Arm, Patient Position: Sitting)   Ht 5\' 1"  (1.549 m)   Wt 120 lb (54.4 kg)   BMI 22.67 kg/m   Physical Exam  Constitutional: She is oriented to person, place, and time. She appears well-developed and well-nourished.  HENT:  Head: Normocephalic and atraumatic.  Eyes: Pupils are equal, round, and reactive to light. EOM are normal.  Neck: Normal range of motion. Neck supple.  Pulmonary/Chest: Effort normal and breath sounds normal.  Abdominal: Soft. Bowel sounds are normal.  Neurological: She is alert and oriented to person, place, and time.  Skin: Skin is warm and dry.  Psychiatric: She has a normal mood and affect. Her behavior is normal. Judgment and thought content normal.    Back Exam   Tenderness  The patient is experiencing tenderness in the lumbar.  Range of Motion  Extension: abnormal  Flexion: normal  Lateral  Bend Right: abnormal  Lateral Bend Left: abnormal  Rotation Right: abnormal  Rotation Left: abnormal   Muscle Strength  Right Quadriceps:  5/5  Left Quadriceps:  5/5  Right Hamstrings:  5/5  Left Hamstrings:  5/5   Tests  Straight leg raise right: negative Straight leg raise left: negative  Reflexes  Patellar: normal Achilles: normal Babinski's sign: normal   Other  Toe Walk: abnormal Heel Walk: abnormal Sensation: normal Gait: normal   Comments:  Continues with the LS brace.      Specialty Comments:  No specialty comments available.  Imaging: No results found.   PMFS History: Patient Active Problem List   Diagnosis Date Noted  . Spondylolisthesis of lumbar region 08/30/2016    Priority: High    Class: Chronic  . DDD (degenerative disc disease), lumbar 08/30/2016    Priority: High  . Spinal stenosis, lumbar region, with neurogenic claudication 08/30/2016    Priority: High    Class: Chronic  . Mucoid cyst of joint 11/20/2016  . Osteoarthritis of finger of right hand 11/20/2016  . Lumbar degenerative disc disease 08/30/2016  . Dizziness 06/12/2015  . ICD (implantable cardioverter-defibrillator), biventricular, in situ 02/16/2015  . Chronic systolic heart failure ([place]) 02/16/2015  . Nonischemic cardiomyopathy ([place]) 01/19/2015  . Anemia, secondary to surgery 01/18/2015  . B12 deficiency 01/18/2015  . CHB (complete heart block)- post op 01/18/2015  . 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 Severe aortic stenosis 11/14/2014  . Pulmonary hypertension due to left ventricular systolic dysfunction ([place]) 11/09/2014  . Personal history of colonic polyps 07/30/2013  . COPD GOLD 0/ still smoking  07/07/2013  . CIS (carcinoma in situ)   . Osteopenia   . HSV-1 (herpes simplex virus 1) infection   . HSV-2 (herpes simplex virus 2) infection   . Bicuspid aortic valve   . Aortic aneurysm ([place])   . Adrenal tumor   .  Degenerative disc disease   . Smoker    Past Medical History:  Diagnosis Date  . Adrenal tumor   . AICD (automatic cardioverter/defibrillator) present   . Anxiety   . Aortic aneurysm ([place])   . Asthma   . Bicuspid aortic valve    CONGENITAL  . Cancer ([place])    skin   . Carpal tunnel syndrome, bilateral   . Chronic depression   . Chronic fatigue   . Chronic sinusitis   . 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 COPD (chronic obstructive pulmonary disease) ([place])   . Coronary artery disease   . Degenerative disc disease    CERVICAL AND LUMBAR (DR. Lorin Mercy)  . GERD (gastroesophageal reflux disease)   . Hammer toe of right foot   . Headache   . Heart murmur   . Hepatomegaly   . HSV-1 (herpes simplex virus 1) infection   . HSV-2 (herpes simplex virus 2) infection   . Hypertension   . Insomnia   . Lumbar stenosis   . Osteoarthritis   . Osteopenia   . Pernicious anemia   .  Plantar fasciitis   . Shortness of breath dyspnea    with exertion  . Smoker   . Venous insufficiency of left leg   . Vitamin B 12 deficiency     Family History  Problem Relation Age of Onset  . Pancreatic cancer Mother 38  . Diabetes Father   . Hypertension Father   . Heart disease Father 66       CAD  . Breast cancer Sister   . Diabetes Maternal Grandmother   . Hypertension Maternal Grandmother   . Cancer Maternal Grandfather        colon or stomach  . Hypertension Paternal Grandmother   . Heart disease Paternal Grandmother        Later onset  . Diverticulosis Paternal Grandmother   . Asthma Grandchild     Past Surgical History:  Procedure Laterality Date  . AORTIC VALVE REPLACEMENT N/A 01/12/2015   Procedure: AORTIC VALVE REPLACEMENT (AVR);  Surgeon: Gaye Pollack, MD;  Location: [place];  Service: Open Heart Surgery;  Laterality: N/A;  . ASCENDING AORTIC ROOT REPLACEMENT N/A 01/12/2015   Procedure: ASCENDING AORTIC ROOT REPLACEMENT;  Surgeon: Gaye Pollack, MD;  Location: [place];  Service:  Open Heart Surgery;  Laterality: N/A;  . BI-VENTRICULAR PACEMAKER INSERTION N/A 01/18/2015   Procedure: BI-VENTRICULAR PACEMAKER INSERTION (CRT-P);  Surgeon: Evans Lance, MD;  Location: [facility] CATH LAB;  Service: Cardiovascular;  Laterality: N/A;  . BUNIONECTOMY WITH HAMMERTOE RECONSTRUCTION Right   . CARPAL TUNNEL RELEASE Bilateral 2008  . CATARACT EXTRACTION Bilateral    X2  . CATARACT EXTRACTION W/ INTRAOCULAR LENS  IMPLANT, BILATERAL    . COLONOSCOPY    . CYST EXCISION     right side of throat  . EXCISION OF BASAL CELL CA     SKIN   . EXCISION OF VULVAR CIS    . EYE SURGERY    . FUNCTIONAL ENDOSCOPIC SINUS SURGERY    . LEFT AND RIGHT HEART CATHETERIZATION WITH CORONARY ANGIOGRAM N/A 09/23/2014   Procedure: LEFT AND RIGHT HEART CATHETERIZATION WITH CORONARY ANGIOGRAM;  Surgeon: Minus Breeding, MD;  [facility];  Service: Cardiovascular;  Laterality: N/A;  . MASS EXCISION Right 04/03/2017   Procedure: EXCISION CYST DEBRIDMENT DISTAL INTERPHALANEAL RIGHT INDEX FINGER;  Surgeon: Daryll Brod, MD;  Location: [place];  Service: Orthopedics;  Laterality: Right;  REG/FAB  . MULTIPLE TOOTH EXTRACTIONS    . SINUS PROCEDURE  2009   x 6  . TEE WITHOUT CARDIOVERSION N/A 10/06/2014   Procedure: TRANSESOPHAGEAL ECHOCARDIOGRAM (TEE);  Surgeon: Sueanne Margarita, MD;  Location: [facility] ENDOSCOPY;  Service: Cardiovascular;  Laterality: N/A;  . TEE WITHOUT CARDIOVERSION N/A 01/12/2015   Procedure: TRANSESOPHAGEAL ECHOCARDIOGRAM (TEE);  Surgeon: Gaye Pollack, MD;  Location: [place];  Service: Open Heart Surgery;  Laterality: N/A;  . UPPER GASTROINTESTINAL ENDOSCOPY     Social History   Occupational History  . retired Retired   Social History Main Topics  . Smoking status: Current Every Day Smoker    Packs/day: 0.50    Years: 50.00    Types: Cigarettes  . Smokeless tobacco: Never Used     Comment: uses vapor cig  . Alcohol use No  . Drug use: No  . Sexual activity: Not on file

## 2017-07-17 NOTE — Patient Instructions (Signed)
Avoid frequent bending and stooping  No lifting greater than 10 lbs. May use ice or moist heat for pain. Weight loss is of benefit. Handicap license is approved. Take Vitamin D supplements, calcium 200 mg per serving of dairy product daily requirement is 1500 mg per day. The CT scan is optimistic that fusion may still occur. The L3-4 level appears to have healed and the L4-5 level is showing areas of spot welding of the bone graft to the endplates. Recommend return in 2 month ad recheck of lumbar rexray for motion.

## 2017-07-22 ENCOUNTER — Telehealth: Payer: Self-pay | Admitting: Cardiology

## 2017-07-22 ENCOUNTER — Ambulatory Visit (INDEPENDENT_AMBULATORY_CARE_PROVIDER_SITE_OTHER): Payer: Medicare Other

## 2017-07-22 DIAGNOSIS — Z95810 Presence of automatic (implantable) cardiac defibrillator: Secondary | ICD-10-CM | POA: Diagnosis not present

## 2017-07-22 DIAGNOSIS — I5022 Chronic systolic (congestive) heart failure: Secondary | ICD-10-CM | POA: Diagnosis not present

## 2017-07-22 NOTE — Progress Notes (Signed)
EPIC Encounter for ICM Monitoring  Patient Name: Carol Warren is a 71 y.o. female Date: 07/22/2017 Primary Care Physican: Ernestene Kiel, MD Primary Cardiologist:Hochrein Electrophysiologist: Lovena Le Dry Weight:120lbs  Bi-V Pacing: 97.5%      Heart Failure questions reviewed, pt asymptomatic    Thoracic impedance normal.  Prescribed dosage: Furosemide 40 mg 1 tablet daily.    Labs: 04/02/2017 Creatinine 1.26, BUN 20, Potassium 3.9, Sodium 134, EGFR 42-49 09/02/2017Creatinine 0.71, BUN 5, Potassium 3.5, Sodium 136, EGFR >60  08/25/2017Creatinine 0.94, BUN 12, Potassium 4.1, Sodium 135, EGFR >60   Recommendations: No changes.    Encouraged to call for fluid symptoms.  Follow-up plan: ICM clinic phone appointment on 08/25/2017.  Office appointment scheduled 08/04/2017 with Dr. Percival Spanish.  Copy of ICM check sent to primary cardiologist and device physician.   3 month ICM trend: 07/22/2017   1 Year ICM trend:      Rosalene Billings, RN 07/22/2017 4:49 PM

## 2017-07-22 NOTE — Telephone Encounter (Signed)
LMOVM reminding pt to send remote transmission.   

## 2017-08-03 ENCOUNTER — Encounter: Payer: Self-pay | Admitting: Cardiology

## 2017-08-03 NOTE — Progress Notes (Signed)
Cardiology Office Note   Date:  08/05/2017   ID:  Carol Warren, DOB 1946/11/25, MRN [id number]  PCP:  Ernestene Kiel, MD  Cardiologist:   Minus Breeding, MD    Chief Complaint  Patient presents with  . AVR      History of Present Illness: Carol Warren is a 71 y.o. female who presents for evaluation after valve replacement.  She has chronic systolic and diastolic HF.  Since I last saw her she has done well from a cardiovascular standpoint.  However, she is very limited by back pain.  The patient denies any new symptoms such as chest discomfort, neck or arm discomfort. There has been no new shortness of breath, PND or orthopnea. There have been no reported palpitations, presyncope or syncope.  I did review her ICD and her thoracic impedance has been OK.    She does report she still smokes and blames this in part on stress at home.   Past Medical History:  Diagnosis Date  . Adrenal tumor   . AICD (automatic cardioverter/defibrillator) present   . Anxiety   . Aortic aneurysm ([place])   . Asthma   . Bicuspid aortic valve    CONGENITAL  . Cancer ([place])    skin   . Carpal tunnel syndrome, bilateral   . Chronic depression   . Chronic sinusitis   . 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 COPD (chronic obstructive pulmonary disease) ([place])   . Coronary artery disease   . Degenerative disc disease    CERVICAL AND LUMBAR (DR. Lorin Mercy)  . GERD (gastroesophageal reflux disease)   . HSV-1 (herpes simplex virus 1) infection   . HSV-2 (herpes simplex virus 2) infection   . Hypertension   . Insomnia   . Lumbar stenosis   . Osteoarthritis   . Osteopenia   . Pernicious anemia   . Plantar fasciitis   . Smoker   . Venous insufficiency of left leg   . Vitamin B 12 deficiency     Past Surgical History:  Procedure Laterality Date  . AORTIC VALVE REPLACEMENT N/A 01/12/2015   Procedure: AORTIC VALVE REPLACEMENT (AVR);  Surgeon: Gaye Pollack, MD;  Location: [place];  Service: Open Heart  Surgery;  Laterality: N/A;  . ASCENDING AORTIC ROOT REPLACEMENT N/A 01/12/2015   Procedure: ASCENDING AORTIC ROOT REPLACEMENT;  Surgeon: Gaye Pollack, MD;  Location: [place];  Service: Open Heart Surgery;  Laterality: N/A;  . BI-VENTRICULAR PACEMAKER INSERTION N/A 01/18/2015   Procedure: BI-VENTRICULAR PACEMAKER INSERTION (CRT-P);  Surgeon: Evans Lance, MD;  Location: [facility] CATH LAB;  Service: Cardiovascular;  Laterality: N/A;  . BUNIONECTOMY WITH HAMMERTOE RECONSTRUCTION Right   . CARPAL TUNNEL RELEASE Bilateral 2008  . CATARACT EXTRACTION Bilateral    X2  . CATARACT EXTRACTION W/ INTRAOCULAR LENS  IMPLANT, BILATERAL    . COLONOSCOPY    . CYST EXCISION     right side of throat  . EXCISION OF BASAL CELL CA     SKIN   . EXCISION OF VULVAR CIS    . EYE SURGERY    . FUNCTIONAL ENDOSCOPIC SINUS SURGERY    . LEFT AND RIGHT HEART CATHETERIZATION WITH CORONARY ANGIOGRAM N/A 09/23/2014   Procedure: LEFT AND RIGHT HEART CATHETERIZATION WITH CORONARY ANGIOGRAM;  Surgeon: Minus Breeding, MD;  Location: [facility] CATH LAB;  Service: Cardiovascular;  Laterality: N/A;  . MASS EXCISION Right 04/03/2017   Procedure: EXCISION CYST DEBRIDMENT DISTAL INTERPHALANEAL RIGHT INDEX FINGER;  Surgeon: Daryll Brod, MD;  Location: [place];  Service: Orthopedics;  Laterality: Right;  REG/FAB  . MULTIPLE TOOTH EXTRACTIONS    . SINUS PROCEDURE  2009   x 6  . TEE WITHOUT CARDIOVERSION N/A 10/06/2014   Procedure: TRANSESOPHAGEAL ECHOCARDIOGRAM (TEE);  Surgeon: Sueanne Margarita, MD;  Location: [facility] ENDOSCOPY;  Service: Cardiovascular;  Laterality: N/A;  . TEE WITHOUT CARDIOVERSION N/A 01/12/2015   Procedure: TRANSESOPHAGEAL ECHOCARDIOGRAM (TEE);  Surgeon: Gaye Pollack, MD;  Location: [place];  Service: Open Heart Surgery;  Laterality: N/A;  . UPPER GASTROINTESTINAL ENDOSCOPY       Current Outpatient Prescriptions  Medication Sig Dispense Refill  . albuterol (PROVENTIL HFA;VENTOLIN HFA) 108 (90 BASE) MCG/ACT inhaler Inhale 1-2 puffs  into the lungs every 6 (six) hours as needed for wheezing or shortness of breath (depends on congestion if 1-2 puffs).     . albuterol (PROVENTIL) (2.5 MG/3ML) 0.083% nebulizer solution Take 2.5 mg by nebulization 2 (two) times daily as needed for wheezing.    . Artificial Tear Solution (GENTEAL TEARS OP) Apply 1 drop to eye 3 (three) times daily as needed (dry eyes).    [name] aspirin EC 325 MG EC tablet Take 1 tablet (325 mg total) by mouth daily. 30 tablet 0  . B Complex Vitamins (VITAMIN B COMPLEX PO) Take 1 tablet by mouth daily.     . Calcium Carbonate-Vitamin D (CALCIUM-D PO) Take 1 tablet by mouth daily.     . carboxymethylcellulose (REFRESH PLUS) 0.5 % SOLN Place 1 drop into both eyes as needed (dry eyes).    . carvedilol (COREG) 12.5 MG tablet Take 1 tablet (12.5 mg total) by mouth 2 (two) times daily with a meal. 180 tablet 3  . diazepam (VALIUM) 10 MG tablet Take 10 mg by mouth 3 (three) times daily as needed for anxiety.    Mariane Baumgarten Calcium (STOOL SOFTENER PO) Take 2 tablets by mouth daily as needed (constipation).    [name] FLUoxetine (PROZAC) 20 MG capsule Take 20 mg by mouth daily.    . fluticasone (FLONASE) 50 MCG/ACT nasal spray Place 1 spray into the nose 2 (two) times daily.     . furosemide (LASIX) 40 MG tablet Take 40 mg by mouth daily.    . Halobetasol-Ammonium Lactate 0.05 & 12 % (CREAM) KIT Apply 1 application topically daily as needed (dryness).    [name] ibuprofen (ADVIL,MOTRIN) 200 MG tablet Take 200 mg by mouth daily as needed for headache or mild pain.     [name] loratadine (CLARITIN) 10 MG tablet Take 10 mg by mouth daily.    [name] losartan (COZAAR) 50 MG tablet Take 50 mg by mouth daily.    . meloxicam (MOBIC) 15 MG tablet Take 15 mg by mouth daily.     . montelukast (SINGULAIR) 10 MG tablet Take 10 mg by mouth at bedtime.    . Multiple Vitamin (MULTIVITAMIN) capsule Take 1 capsule by mouth daily.    . Olopatadine HCl (PATANASE) 0.6 % SOLN Place into the nose.    [name] omeprazole  (PRILOSEC) 20 MG capsule Take 40 mg by mouth daily.    . polyethylene glycol (MIRALAX / GLYCOLAX) packet Take 17 g by mouth daily as needed for mild constipation.    . Probiotic Product (PROBIOTIC DAILY PO) Take 1 capsule by mouth daily. Ultimate flora    . pseudoephedrine-guaifenesin (MUCINEX D) 60-600 MG per tablet Take 1 tablet by mouth daily as needed for congestion.     . ranitidine (  ZANTAC) 300 MG capsule Take 300 mg by mouth every evening.    . Sodium Chloride-Sodium Bicarb (CLASSIC NETI POT SINUS WASH NA) Place 2 sprays into the nose daily.     . traMADol-acetaminophen (ULTRACET) 37.5-325 MG tablet Take 1 tablet by mouth every 8 (eight) hours as needed. 60 tablet 0   No current facility-administered medications for this visit.     Allergies:   No known allergies    ROS:  Please see the history of present illness.   Otherwise, review of systems are positive for none.   All other systems are reviewed and negative.    PHYSICAL EXAM: VS:  BP (!) 146/78   Pulse 88   Ht _0  (1.549 m)   Wt 127 lb (57.6 kg)   BMI 24.00 kg/m  , BMI Body mass index is 24 kg/m.  GENERAL:  Well appearing NECK:  No jugular venous distention, waveform within normal limits, carotid upstroke brisk and symmetric, no bruits, no thyromegaly LUNGS:  Clear to auscultation bilaterally BACK:  No CVA tenderness CHEST:  Unremarkable HEART:  PMI not displaced or sustained,S1 and S2 within normal limits, no S3, no S4, no clicks, no rubs, 2/6 apical systolic murmur without radiation and no diastolic murmurs ABD:  Flat, positive bowel sounds normal in frequency in pitch, no bruits, no rebound, no guarding, no midline pulsatile mass, no hepatomegaly, no splenomegaly EXT:  2 plus pulses upper and decreased DP/PT on the right, no edema, no cyanosis no clubbing   EKG:  EKG is not  ordered today.    Recent Labs: 08/23/2016: ALT 23 04/02/2017: BUN 20; Creatinine, Ser 1.26; Hemoglobin 11.6; Platelets 359; Potassium 3.9;  Sodium 134    Lipid Panel No results found for: CHOL, TRIG, HDL, CHOLHDL, VLDL, LDLCALC, LDLDIRECT    Wt Readings from Last 3 Encounters:  08/04/17 127 lb (57.6 kg)  07/17/17 120 lb (54.4 kg)  06/23/17 120 lb (54.4 kg)      Other studies Reviewed: Additional studies/ records that were reviewed today include: Device interrogation . Review of the above records demonstrates: As above  ASSESSMENT AND PLAN:  AVR:  This was stable on echo in May of last year.  I will follow with another echocardiogram.   TOBACCO ABUSE:     I discussed with her again the need to stop smoking.  I reviewed an angiogram she had done in [place] earlier this year and she has extensive PVD.  I reviewed this is a very solid reason to try again to stop smoking.  She does not want any prescription therapy.    CARDIOMYOPATHY:  The EF was mildly reduced.  I will follow with an echo as above.   ICD:   She is up-to-date with follow-up and I reviewed these tracings again today.   Current medicines are reviewed at length with the patient today.  The patient does not have concerns regarding medicines.  The following changes have been made:  None  Labs/ tests ordered today include:   Orders Placed This Encounter  Procedures  . ECHOCARDIOGRAM COMPLETE     Disposition:   FU with me in 6 months.     Signed, Minus Breeding, MD  08/05/2017 11:51 AM    [facility]

## 2017-08-04 ENCOUNTER — Ambulatory Visit (INDEPENDENT_AMBULATORY_CARE_PROVIDER_SITE_OTHER): Payer: Medicare Other | Admitting: Cardiology

## 2017-08-04 ENCOUNTER — Other Ambulatory Visit (INDEPENDENT_AMBULATORY_CARE_PROVIDER_SITE_OTHER): Payer: Self-pay | Admitting: Specialist

## 2017-08-04 ENCOUNTER — Encounter: Payer: Self-pay | Admitting: Cardiology

## 2017-08-04 VITALS — BP 146/78 | HR 88 | Ht 61.0 in | Wt 127.0 lb

## 2017-08-04 DIAGNOSIS — Z981 Arthrodesis status: Secondary | ICD-10-CM

## 2017-08-04 DIAGNOSIS — F17200 Nicotine dependence, unspecified, uncomplicated: Secondary | ICD-10-CM

## 2017-08-04 DIAGNOSIS — Z952 Presence of prosthetic heart valve: Secondary | ICD-10-CM

## 2017-08-04 DIAGNOSIS — Z95810 Presence of automatic (implantable) cardiac defibrillator: Secondary | ICD-10-CM | POA: Diagnosis not present

## 2017-08-04 DIAGNOSIS — I428 Other cardiomyopathies: Secondary | ICD-10-CM | POA: Diagnosis not present

## 2017-08-04 NOTE — Patient Instructions (Signed)
Medication Instructions:  Continue current medications  Labwork: None Ordered  Testing/Procedures: Your physician has requested that you have an echocardiogram. Echocardiography is a painless test that uses sound waves to create images of your heart. It provides your doctor with information about the size and shape of your heart and how well your heart's chambers and valves are working. This procedure takes approximately one hour. There are no restrictions for this procedure.   Follow-Up: Your physician wants you to follow-up in: 6 Months. You will receive a reminder letter in the mail two months in advance. If you don't receive a letter, please call our office to schedule the follow-up appointment.   Any Other Special Instructions Will Be Listed Below (If Applicable).   If you need a refill on your cardiac medications before your next appointment, please call your pharmacy.   Thank you for choosing [facility] at [facility]!!

## 2017-08-05 ENCOUNTER — Encounter: Payer: Self-pay | Admitting: Cardiology

## 2017-08-05 MED ORDER — TRAMADOL-ACETAMINOPHEN 37.5-325 MG PO TABS: 1.0000 | ORAL_TABLET | Freq: Three times a day (TID) | ORAL | 0 refills | Status: DC | PRN

## 2017-08-05 NOTE — Telephone Encounter (Signed)
Tramadol Rx printed and will need fax to her pharmacy.

## 2017-08-11 ENCOUNTER — Ambulatory Visit (HOSPITAL_COMMUNITY): Payer: Medicare Other | Attending: Cardiovascular Disease

## 2017-08-11 ENCOUNTER — Other Ambulatory Visit: Payer: Self-pay

## 2017-08-11 DIAGNOSIS — I119 Hypertensive heart disease without heart failure: Secondary | ICD-10-CM | POA: Diagnosis not present

## 2017-08-11 DIAGNOSIS — Z952 Presence of prosthetic heart valve: Secondary | ICD-10-CM

## 2017-08-11 DIAGNOSIS — I429 Cardiomyopathy, unspecified: Secondary | ICD-10-CM | POA: Insufficient documentation

## 2017-08-11 DIAGNOSIS — I340 Nonrheumatic mitral (valve) insufficiency: Secondary | ICD-10-CM | POA: Diagnosis not present

## 2017-08-11 DIAGNOSIS — Z720 Tobacco use: Secondary | ICD-10-CM | POA: Insufficient documentation

## 2017-08-11 DIAGNOSIS — I359 Nonrheumatic aortic valve disorder, unspecified: Secondary | ICD-10-CM | POA: Diagnosis present

## 2017-08-13 ENCOUNTER — Encounter: Payer: Self-pay | Admitting: Cardiology

## 2017-08-20 ENCOUNTER — Encounter (HOSPITAL_COMMUNITY): Payer: Self-pay | Admitting: Orthopedic Surgery

## 2017-08-20 NOTE — Addendum Note (Signed)
Addendum  created 08/20/17 1359 by Albertha Ghee, MD   Sign clinical note

## 2017-08-21 ENCOUNTER — Encounter: Payer: Self-pay | Admitting: Internal Medicine

## 2017-08-21 ENCOUNTER — Ambulatory Visit (INDEPENDENT_AMBULATORY_CARE_PROVIDER_SITE_OTHER): Payer: Medicare Other | Admitting: Specialist

## 2017-08-24 ENCOUNTER — Other Ambulatory Visit (INDEPENDENT_AMBULATORY_CARE_PROVIDER_SITE_OTHER): Payer: Self-pay | Admitting: Specialist

## 2017-08-24 DIAGNOSIS — Z981 Arthrodesis status: Secondary | ICD-10-CM

## 2017-08-25 ENCOUNTER — Ambulatory Visit (INDEPENDENT_AMBULATORY_CARE_PROVIDER_SITE_OTHER): Payer: Medicare Other | Admitting: *Deleted

## 2017-08-25 DIAGNOSIS — I428 Other cardiomyopathies: Secondary | ICD-10-CM | POA: Diagnosis not present

## 2017-08-25 DIAGNOSIS — Z95810 Presence of automatic (implantable) cardiac defibrillator: Secondary | ICD-10-CM | POA: Diagnosis not present

## 2017-08-25 DIAGNOSIS — I5022 Chronic systolic (congestive) heart failure: Secondary | ICD-10-CM | POA: Diagnosis not present

## 2017-08-25 NOTE — Progress Notes (Signed)
Remote ICD transmission.   

## 2017-08-26 ENCOUNTER — Other Ambulatory Visit (INDEPENDENT_AMBULATORY_CARE_PROVIDER_SITE_OTHER): Payer: Self-pay | Admitting: Radiology

## 2017-08-26 LAB — CUP PACEART REMOTE DEVICE CHECK
Battery Voltage: 2.96 V
Brady Statistic AP VP Percent: 1.6 %
Brady Statistic AP VS Percent: 0.01 %
Brady Statistic RV Percent Paced: 98.36 %
HIGH POWER IMPEDANCE MEASURED VALUE: 66 Ohm
Implantable Lead Implant Date: 20160120
Implantable Lead Location: 753859
Implantable Lead Location: 753860
Implantable Lead Model: 5076
Implantable Pulse Generator Implant Date: 20160120
Lead Channel Impedance Value: 399 Ohm
Lead Channel Impedance Value: 418 Ohm
Lead Channel Impedance Value: 608 Ohm
Lead Channel Impedance Value: 646 Ohm
Lead Channel Impedance Value: 665 Ohm
Lead Channel Impedance Value: 893 Ohm
Lead Channel Impedance Value: 893 Ohm
Lead Channel Pacing Threshold Amplitude: 0.375 V
Lead Channel Pacing Threshold Amplitude: 0.625 V
Lead Channel Pacing Threshold Pulse Width: 0.4 ms
Lead Channel Pacing Threshold Pulse Width: 0.4 ms
Lead Channel Sensing Intrinsic Amplitude: 1 mV
Lead Channel Sensing Intrinsic Amplitude: 9.125 mV
Lead Channel Setting Pacing Amplitude: 2 V
Lead Channel Setting Pacing Pulse Width: 0.4 ms
Lead Channel Setting Sensing Sensitivity: 0.45 mV
MDC IDC LEAD IMPLANT DT: 20160120
MDC IDC LEAD IMPLANT DT: 20160120
MDC IDC LEAD LOCATION: 753858
MDC IDC MSMT BATTERY REMAINING LONGEVITY: 39 mo
MDC IDC MSMT LEADCHNL LV IMPEDANCE VALUE: 342 Ohm
MDC IDC MSMT LEADCHNL LV IMPEDANCE VALUE: 513 Ohm
MDC IDC MSMT LEADCHNL LV IMPEDANCE VALUE: 874 Ohm
MDC IDC MSMT LEADCHNL LV PACING THRESHOLD AMPLITUDE: 1.5 V
MDC IDC MSMT LEADCHNL RA IMPEDANCE VALUE: 399 Ohm
MDC IDC MSMT LEADCHNL RA PACING THRESHOLD PULSEWIDTH: 0.4 ms
MDC IDC MSMT LEADCHNL RA SENSING INTR AMPL: 1 mV
MDC IDC MSMT LEADCHNL RV IMPEDANCE VALUE: 532 Ohm
MDC IDC MSMT LEADCHNL RV IMPEDANCE VALUE: 665 Ohm
MDC IDC MSMT LEADCHNL RV SENSING INTR AMPL: 9.125 mV
MDC IDC SESS DTM: 20180827073625
MDC IDC SET LEADCHNL LV PACING AMPLITUDE: 2.5 V
MDC IDC SET LEADCHNL LV PACING PULSEWIDTH: 0.4 ms
MDC IDC SET LEADCHNL RA PACING AMPLITUDE: 1.5 V
MDC IDC STAT BRADY AS VP PERCENT: 97.93 %
MDC IDC STAT BRADY AS VS PERCENT: 0.46 %
MDC IDC STAT BRADY RA PERCENT PACED: 1.6 %

## 2017-08-26 MED ORDER — TRAMADOL-ACETAMINOPHEN 37.5-325 MG PO TABS: 1.0000 | ORAL_TABLET | Freq: Three times a day (TID) | ORAL | 0 refills | Status: DC | PRN

## 2017-08-26 NOTE — Progress Notes (Signed)
EPIC Encounter for ICM Monitoring  Patient Name: Carol Warren is a 71 y.o. female Date: 08/26/2017 Primary Care Physican: Prochnau, Caroline, MD Primary Cardiologist:Hochrein Electrophysiologist: Taylor Dry Weight:124lbs  Bi-V Pacing: 98.4%       Heart Failure questions reviewed, pt asymptomatic.   Thoracic impedance normal.  Prescribed dosage: Furosemide 40 mg 1 tablet daily.   Labs: 07/23/2017 Creatinine 0.94, BUN 15, Potassium 4.5, Sodium 130, EGFR 62-71 04/02/2017 Creatinine 1.26, BUN 20, Potassium 3.9, Sodium 134, EGFR 42-49 09/02/2017Creatinine 0.71, BUN 5, Potassium 3.5, Sodium 136, EGFR >60  08/25/2017Creatinine 0.94, BUN 12, Potassium 4.1, Sodium 135, EGFR >60   Recommendations: No changes.   Encouraged to call for fluid symptoms.  Follow-up plan: ICM clinic phone appointment on 10/02/2017.    Copy of ICM check sent to Dr. Taylor.   3 month ICM trend: 08/25/2017   1 Year ICM trend:       S , RN 08/26/2017 2:17 PM    

## 2017-09-05 ENCOUNTER — Encounter: Payer: Self-pay | Admitting: Cardiology

## 2017-09-12 ENCOUNTER — Other Ambulatory Visit (INDEPENDENT_AMBULATORY_CARE_PROVIDER_SITE_OTHER): Payer: Self-pay | Admitting: Orthopaedic Surgery

## 2017-09-12 ENCOUNTER — Ambulatory Visit (INDEPENDENT_AMBULATORY_CARE_PROVIDER_SITE_OTHER): Payer: Medicare Other | Admitting: Specialist

## 2017-09-12 DIAGNOSIS — Z981 Arthrodesis status: Secondary | ICD-10-CM

## 2017-09-16 ENCOUNTER — Other Ambulatory Visit (INDEPENDENT_AMBULATORY_CARE_PROVIDER_SITE_OTHER): Payer: Self-pay | Admitting: Specialist

## 2017-09-16 ENCOUNTER — Other Ambulatory Visit (INDEPENDENT_AMBULATORY_CARE_PROVIDER_SITE_OTHER): Payer: Self-pay

## 2017-09-16 ENCOUNTER — Other Ambulatory Visit (INDEPENDENT_AMBULATORY_CARE_PROVIDER_SITE_OTHER): Payer: Self-pay | Admitting: Orthopaedic Surgery

## 2017-09-16 DIAGNOSIS — Z981 Arthrodesis status: Secondary | ICD-10-CM

## 2017-09-16 MED ORDER — TRAMADOL HCL 50 MG PO TABS: 50.0000 mg | ORAL_TABLET | Freq: Four times a day (QID) | ORAL | 0 refills | Status: DC | PRN

## 2017-09-16 MED ORDER — TRAMADOL-ACETAMINOPHEN 37.5-325 MG PO TABS: 1.0000 | ORAL_TABLET | Freq: Three times a day (TID) | ORAL | 0 refills | Status: DC | PRN

## 2017-09-16 NOTE — Telephone Encounter (Signed)
71 year old female post lumbar fusion, has delayed healing, history of aortic valve and proximal aorta replacement with pacemake-defibrillator.  She should not take NSAIDS due to cardiac concerns. Will renew her ultracet. [place] [facility] web site reviewed. jen

## 2017-09-18 ENCOUNTER — Encounter (INDEPENDENT_AMBULATORY_CARE_PROVIDER_SITE_OTHER): Payer: Self-pay | Admitting: Specialist

## 2017-09-18 ENCOUNTER — Ambulatory Visit (INDEPENDENT_AMBULATORY_CARE_PROVIDER_SITE_OTHER): Payer: Medicare Other

## 2017-09-18 ENCOUNTER — Ambulatory Visit: Payer: Medicare Other | Admitting: Physical Medicine & Rehabilitation

## 2017-09-18 ENCOUNTER — Ambulatory Visit (INDEPENDENT_AMBULATORY_CARE_PROVIDER_SITE_OTHER): Payer: Medicare Other | Admitting: Specialist

## 2017-09-18 VITALS — BP 135/83 | HR 106 | Ht 61.0 in | Wt 120.0 lb

## 2017-09-18 DIAGNOSIS — M4326 Fusion of spine, lumbar region: Secondary | ICD-10-CM

## 2017-09-18 DIAGNOSIS — M960 Pseudarthrosis after fusion or arthrodesis: Secondary | ICD-10-CM

## 2017-09-18 DIAGNOSIS — Z4889 Encounter for other specified surgical aftercare: Secondary | ICD-10-CM | POA: Diagnosis not present

## 2017-09-18 NOTE — Addendum Note (Signed)
Addended by: Basil Dess on: 09/18/2017 04:08 PM   Modules accepted: Orders

## 2017-09-18 NOTE — Patient Instructions (Signed)
Avoid frequent bending and stooping  No lifting greater than 10 lbs. May use ice or moist heat for pain. Weight loss is of benefit. Handicap license is approved. CT scan to evaluate the L3-4 and L4-5 fusions

## 2017-09-18 NOTE — Progress Notes (Signed)
Office Visit Note   Patient: Carol Warren           Date of Birth: 14-May-1946           MRN: [id number] Visit Date: 09/18/2017              Requested by: Ernestene Kiel, MD [address] [place], [place] [address] PCP: Ernestene Kiel, MD   Assessment & Plan: Visit Diagnoses:  1. Fusion of spine of lumbar region     Plan: Avoid frequent bending and stooping  No lifting greater than 10 lbs. May use ice or moist heat for pain. Weight loss is of benefit. Handicap license is approved. CT scan to evaluate the L3-4 and L4-5 fusions   Follow-Up Instructions: No Follow-up on file.   Orders:  Orders Placed This Encounter  Procedures  . XR Lumbar Spine 2-3 Views   No orders of the defined types were placed in this encounter.     Procedures: No procedures performed   Clinical Data: No additional findings.   Subjective: Chief Complaint  Patient presents with  . Lower Back - Follow-up    71 year old female status post L3-4 and L4-5 fusions 08/30/2016 she has history of AVR and proximal aorta replacement with an indwelling pacemake and defibrillator.  She has persistent pain in her back. Follow up xrays with  Movement at the L3-4 and L4-5 level, almost 81mm difference on flexion and extension.    Review of Systems  Constitutional: Negative.   HENT: Negative.   Eyes: Negative.   Respiratory: Negative.   Cardiovascular: Negative.   Gastrointestinal: Negative.   Endocrine: Negative.   Genitourinary: Negative.   Musculoskeletal: Negative.   Skin: Negative.   Allergic/Immunologic: Negative.   Neurological: Negative.   Hematological: Negative.   Psychiatric/Behavioral: Negative.      Objective: Vital Signs: BP 135/83 (BP Location: Left Arm, Patient Position: Sitting)   Pulse (!) 106   Ht 5\' 1"  (1.549 m)   Wt 120 lb (54.4 kg)   BMI 22.67 kg/m   Physical Exam  Constitutional: She is oriented to person, place, and time. She appears well-developed and  well-nourished.  HENT:  Head: Normocephalic and atraumatic.  Eyes: Pupils are equal, round, and reactive to light. EOM are normal.  Neck: Normal range of motion. Neck supple. No JVD present. No tracheal deviation present. No thyromegaly present.  Pulmonary/Chest: Effort normal and breath sounds normal.  Abdominal: Soft. Bowel sounds are normal. She exhibits no mass.  Lymphadenopathy:    She has no cervical adenopathy.  Neurological: She is alert and oriented to person, place, and time.  Skin: Skin is warm and dry.  Psychiatric: She has a normal mood and affect. Her behavior is normal. Judgment and thought content normal.    Back Exam   Tenderness  The patient is experiencing tenderness in the lumbar.  Range of Motion  Extension: abnormal  Flexion: abnormal  Lateral Bend Right: abnormal  Lateral Bend Left: abnormal  Rotation Right: abnormal  Rotation Left: abnormal   Muscle Strength  Right Quadriceps:  5/5  Left Quadriceps:  5/5  Right Hamstrings:  5/5  Left Hamstrings:  5/5   Tests  Straight leg raise right: negative Straight leg raise left: negative  Reflexes  Patellar: 1/4 Achilles: 1/4 Biceps: 1/4 Babinski's sign: normal   Other  Toe Walk: abnormal Heel Walk: abnormal Sensation: normal Gait: normal       Specialty Comments:  No specialty comments available.  Imaging:  No results found.   PMFS History: Patient Active Problem List   Diagnosis Date Noted  . Spondylolisthesis of lumbar region 08/30/2016    Priority: High    Class: Chronic  . DDD (degenerative disc disease), lumbar 08/30/2016    Priority: High  . Spinal stenosis, lumbar region, with neurogenic claudication 08/30/2016    Priority: High    Class: Chronic  . Mucoid cyst of joint 11/20/2016  . Osteoarthritis of finger of right hand 11/20/2016  . Lumbar degenerative disc disease 08/30/2016  . Dizziness 06/12/2015  . ICD (implantable cardioverter-defibrillator), biventricular, in situ  02/16/2015  . Chronic systolic heart failure ([place]) 02/16/2015  . Nonischemic cardiomyopathy ([place]) 01/19/2015  . Anemia, secondary to surgery 01/18/2015  . B12 deficiency 01/18/2015  . CHB (complete heart block)- post op 01/18/2015  . 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 Severe aortic stenosis 11/14/2014  . Pulmonary hypertension due to left ventricular systolic dysfunction ([place]) 11/09/2014  . Personal history of colonic polyps 07/30/2013  . COPD GOLD 0/ still smoking  07/07/2013  . CIS (carcinoma in situ)   . Osteopenia   . HSV-1 (herpes simplex virus 1) infection   . HSV-2 (herpes simplex virus 2) infection   . Bicuspid aortic valve   . Aortic aneurysm ([place])   . Adrenal tumor   . Degenerative disc disease   . Smoker    Past Medical History:  Diagnosis Date  . Adrenal tumor   . AICD (automatic cardioverter/defibrillator) present   . Anxiety   . Aortic aneurysm ([place])   . Asthma   . Bicuspid aortic valve    CONGENITAL  . Cancer ([place])    skin   . Carpal tunnel syndrome, bilateral   . Chronic depression   . Chronic sinusitis   . 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 COPD (chronic obstructive pulmonary disease) ([place])   . Coronary artery disease   . Degenerative disc disease    CERVICAL AND LUMBAR (DR. Lorin Mercy)  . GERD (gastroesophageal reflux disease)   . HSV-1 (herpes simplex virus 1) infection   . HSV-2 (herpes simplex virus 2) infection   . Hypertension   . Insomnia   . Lumbar stenosis   . Osteoarthritis   . Osteopenia   . Pernicious anemia   . Plantar fasciitis   . Smoker   . Venous insufficiency of left leg   . Vitamin B 12 deficiency     Family History  Problem Relation Age of Onset  . Pancreatic cancer Mother 32  . Diabetes Father   . Hypertension Father   . Heart disease Father 30       CAD  . Breast cancer Sister   . Diabetes Maternal Grandmother   . Hypertension Maternal Grandmother   .  Cancer Maternal Grandfather        colon or stomach  . Hypertension Paternal Grandmother   . Heart disease Paternal Grandmother        Later onset  . Diverticulosis Paternal Grandmother   . Asthma Grandchild     Past Surgical History:  Procedure Laterality Date  . AORTIC VALVE REPLACEMENT N/A 01/12/2015   Procedure: AORTIC VALVE REPLACEMENT (AVR);  Surgeon: Gaye Pollack, MD;  Location: [place];  Service: Open Heart Surgery;  Laterality: N/A;  . ASCENDING AORTIC ROOT REPLACEMENT N/A 01/12/2015   Procedure: ASCENDING AORTIC ROOT REPLACEMENT;  Surgeon: Gaye Pollack, MD;  Location: [place];  Service: Open  Heart Surgery;  Laterality: N/A;  . BI-VENTRICULAR PACEMAKER INSERTION N/A 01/18/2015   Procedure: BI-VENTRICULAR PACEMAKER INSERTION (CRT-P);  Surgeon: Evans Lance, MD;  Location: [facility] CATH LAB;  Service: Cardiovascular;  Laterality: N/A;  . BUNIONECTOMY WITH HAMMERTOE RECONSTRUCTION Right   . CARPAL TUNNEL RELEASE Bilateral 2008  . CATARACT EXTRACTION Bilateral    X2  . CATARACT EXTRACTION W/ INTRAOCULAR LENS  IMPLANT, BILATERAL    . COLONOSCOPY    . CYST EXCISION     right side of throat  . EXCISION OF BASAL CELL CA     SKIN   . EXCISION OF VULVAR CIS    . EYE SURGERY    . FUNCTIONAL ENDOSCOPIC SINUS SURGERY    . LEFT AND RIGHT HEART CATHETERIZATION WITH CORONARY ANGIOGRAM N/A 09/23/2014   Procedure: LEFT AND RIGHT HEART CATHETERIZATION WITH CORONARY ANGIOGRAM;  Surgeon: Minus Breeding, MD;  Location: [facility] CATH LAB;  Service: Cardiovascular;  Laterality: N/A;  . MASS EXCISION Right 04/03/2017   Procedure: EXCISION CYST DEBRIDMENT DISTAL INTERPHALANEAL RIGHT INDEX FINGER;  Surgeon: Daryll Brod, MD;  Location: [place];  Service: Orthopedics;  Laterality: Right;  REG/FAB  . MULTIPLE TOOTH EXTRACTIONS    . SINUS PROCEDURE  2009   x 6  . TEE WITHOUT CARDIOVERSION N/A 10/06/2014   Procedure: TRANSESOPHAGEAL ECHOCARDIOGRAM (TEE);  Surgeon: Sueanne Margarita, MD;  Location[facility] ENDOSCOPY;  Service:  Cardiovascular;  Laterality: N/A;  . TEE WITHOUT CARDIOVERSION N/A 01/12/2015   Procedure: TRANSESOPHAGEAL ECHOCARDIOGRAM (TEE);  Surgeon: Gaye Pollack, MD;  Location: [place];  Service: Open Heart Surgery;  Laterality: N/A;  . UPPER GASTROINTESTINAL ENDOSCOPY     Social History   Occupational History  . retired Retired   Social History Main Topics  . Smoking status: Current Every Day Smoker    Packs/day: 0.50    Years: 50.00    Types: Cigarettes  . Smokeless tobacco: Never Used     Comment: uses vapor cig  . Alcohol use No  . Drug use: No  . Sexual activity: Not on file

## 2017-09-22 ENCOUNTER — Telehealth (INDEPENDENT_AMBULATORY_CARE_PROVIDER_SITE_OTHER): Payer: Self-pay | Admitting: Specialist

## 2017-09-22 NOTE — Telephone Encounter (Signed)
Pamala Hurry from [facility] called stating that they received a referral for the patient, but did not receive office notes and the reason for the referral.  CB#[phone].  Thank you.

## 2017-09-23 NOTE — Telephone Encounter (Signed)
Looks ike only referral in chart was for CT scan, Per Gabriel Cirri she will refax the order to scheduling.

## 2017-10-01 ENCOUNTER — Other Ambulatory Visit (INDEPENDENT_AMBULATORY_CARE_PROVIDER_SITE_OTHER): Payer: Self-pay | Admitting: Specialist

## 2017-10-01 DIAGNOSIS — Z981 Arthrodesis status: Secondary | ICD-10-CM

## 2017-10-02 ENCOUNTER — Ambulatory Visit (INDEPENDENT_AMBULATORY_CARE_PROVIDER_SITE_OTHER): Payer: Medicare Other

## 2017-10-02 DIAGNOSIS — Z95810 Presence of automatic (implantable) cardiac defibrillator: Secondary | ICD-10-CM

## 2017-10-02 DIAGNOSIS — I5022 Chronic systolic (congestive) heart failure: Secondary | ICD-10-CM | POA: Diagnosis not present

## 2017-10-02 NOTE — Progress Notes (Signed)
EPIC Encounter for ICM Monitoring  Patient Name: Jozalyn A Moten is a 71 y.o. female Date: 10/02/2017 Primary Care Physican: Prochnau, Caroline, MD Primary Cardiologist:Hochrein Electrophysiologist: Taylor Dry Weight:124lbs  Bi-V Pacing: 99.2%       Heart Failure questions reviewed, pt asymptomatic.   Thoracic impedance normal.  Prescribed dosage: Furosemide 40 mg 1 tablet daily.   Labs: 07/23/2017 Creatinine 0.94, BUN 15, Potassium 4.5, Sodium 130, EGFR 62-71 04/02/2017 Creatinine 1.26, BUN 20, Potassium 3.9, Sodium 134, EGFR 42-49 09/02/2017Creatinine 0.71, BUN 5, Potassium 3.5, Sodium 136, EGFR >60  08/25/2017Creatinine 0.94, BUN 12, Potassium 4.1, Sodium 135, EGFR >60   Recommendations: No changes.   Encouraged to call for fluid symptoms.  Follow-up plan: ICM clinic phone appointment on 11/04/2017.    Copy of ICM check sent to Dr. Taylor.   3 month ICM trend: 10/02/2017   1 Year ICM trend:       S , RN 10/02/2017 11:51 AM    

## 2017-10-03 ENCOUNTER — Other Ambulatory Visit (INDEPENDENT_AMBULATORY_CARE_PROVIDER_SITE_OTHER): Payer: Self-pay | Admitting: Specialist

## 2017-10-03 DIAGNOSIS — Z981 Arthrodesis status: Secondary | ICD-10-CM

## 2017-10-03 MED ORDER — TRAMADOL-ACETAMINOPHEN 37.5-325 MG PO TABS: 1.0000 | ORAL_TABLET | Freq: Three times a day (TID) | ORAL | 0 refills | Status: DC | PRN

## 2017-10-03 NOTE — Telephone Encounter (Signed)
Can you ask Dr. Lorin Mercy about this one please?

## 2017-10-03 NOTE — Telephone Encounter (Signed)
I called patient and advised, script called to pharmacy.

## 2017-10-03 NOTE — Telephone Encounter (Signed)
Can you please advise since Dr. Louanne Skye is out of office?

## 2017-10-03 NOTE — Telephone Encounter (Signed)
Ok thanks 

## 2017-10-03 NOTE — Telephone Encounter (Signed)
Med refill ( Tramadol) Pt is running out of medicine

## 2017-10-18 ENCOUNTER — Other Ambulatory Visit (INDEPENDENT_AMBULATORY_CARE_PROVIDER_SITE_OTHER): Payer: Self-pay | Admitting: Orthopaedic Surgery

## 2017-10-18 DIAGNOSIS — Z981 Arthrodesis status: Secondary | ICD-10-CM

## 2017-10-20 ENCOUNTER — Other Ambulatory Visit (INDEPENDENT_AMBULATORY_CARE_PROVIDER_SITE_OTHER): Payer: Self-pay | Admitting: Specialist

## 2017-10-20 DIAGNOSIS — Z981 Arthrodesis status: Secondary | ICD-10-CM

## 2017-10-20 MED ORDER — TRAMADOL-ACETAMINOPHEN 37.5-325 MG PO TABS: 1.0000 | ORAL_TABLET | Freq: Three times a day (TID) | ORAL | 0 refills | Status: DC | PRN

## 2017-10-21 ENCOUNTER — Encounter: Payer: Self-pay | Admitting: Vascular Surgery

## 2017-10-21 NOTE — Progress Notes (Signed)
Called to [facility]

## 2017-10-30 ENCOUNTER — Encounter (INDEPENDENT_AMBULATORY_CARE_PROVIDER_SITE_OTHER): Payer: Self-pay | Admitting: Specialist

## 2017-10-30 ENCOUNTER — Ambulatory Visit (INDEPENDENT_AMBULATORY_CARE_PROVIDER_SITE_OTHER): Payer: Medicare Other | Admitting: Specialist

## 2017-10-30 VITALS — BP 133/80 | HR 100 | Ht 61.0 in | Wt 124.0 lb

## 2017-10-30 DIAGNOSIS — T84498A Other mechanical complication of other internal orthopedic devices, implants and grafts, initial encounter: Secondary | ICD-10-CM | POA: Diagnosis not present

## 2017-10-30 DIAGNOSIS — M960 Pseudarthrosis after fusion or arthrodesis: Secondary | ICD-10-CM | POA: Diagnosis not present

## 2017-10-30 DIAGNOSIS — M5442 Lumbago with sciatica, left side: Secondary | ICD-10-CM | POA: Diagnosis not present

## 2017-10-30 NOTE — Progress Notes (Signed)
Office Visit Note   Patient: Carol Warren           Date of Birth: December 06, 1946           MRN: [id number] Visit Date: 10/30/2017              Requested by: Ernestene Kiel, MD [address] [place], [place] [address] PCP: Ernestene Kiel, MD   Assessment & Plan: Visit Diagnoses:  1. Loosening of hardware in spine ([place])   2. Pseudarthrosis after fusion or arthrodesis   3. Left-sided low back pain with left-sided sciatica, unspecified chronicity     Plan:Avoid frequent bending and stooping  No lifting greater than 10 lbs. May use ice or moist heat for pain. Weight loss is of benefit. Handicap license is approved. Myelogram and post myelogram CT scan is ordered. Likely will need an exploration of the fusions at L3-4 and L4-5 with placement of cages on the right side and replacement of loosened screws.   Follow-Up Instructions: Return in about 3 weeks (around 11/20/2017).   Orders:  No orders of the defined types were placed in this encounter.  No orders of the defined types were placed in this encounter.     Procedures: No procedures performed   Clinical Data: No additional findings.   Subjective: Chief Complaint  Patient presents with  . Lower Back - Follow-up    71  Year old female with history of L3-4 and L4-5 fusions with left TLIFs in 08/2016. She has a history of aortic stenosis and previous valve replacement with a  anscending aorta bypass. She has had persisting pain into her back and into the left medial thigh and both legs with standing and walking. Her home situation  Unfortunately is such that there is work taking place to repair damage from the [employer]. Roof and ceilings need fixing. She has pain with standing and walking. Not going to store or shopping.    Review of Systems   Objective: Vital Signs: BP 133/80 (BP Location: Left Arm, Patient Position: Sitting)   Pulse 100   Ht 5\' 1"  (1.549 m)   Wt 124 lb (56.2 kg)   BMI  23.43 kg/m   Physical Exam  Constitutional: She is oriented to person, place, and time. She appears well-developed and well-nourished.  HENT:  Head: Normocephalic and atraumatic.  Eyes: Pupils are equal, round, and reactive to light. EOM are normal.  Neck: Normal range of motion. Neck supple.  Pulmonary/Chest: Effort normal and breath sounds normal.  Abdominal: Soft. Bowel sounds are normal.  Neurological: She is alert and oriented to person, place, and time.  Skin: Skin is warm and dry.  Psychiatric: She has a normal mood and affect. Her behavior is normal. Judgment and thought content normal.    Back Exam   Tenderness  The patient is experiencing tenderness in the lumbar.  Range of Motion  Extension: abnormal  Flexion: abnormal  Lateral Bend Right: abnormal  Lateral Bend Left: abnormal  Rotation Right: abnormal  Rotation Left: abnormal   Muscle Strength  Right Quadriceps:  5/5  Left Quadriceps:  5/5  Right Hamstrings:  5/5   Tests  Straight leg raise right: negative Straight leg raise left: negative  Reflexes  Patellar: normal Achilles: normal Biceps: normal Babinski's sign: normal   Other  Toe Walk: normal Heel Walk: normal Sensation: normal Erythema: no back redness Scars: absent      Specialty Comments:  No specialty comments available.  Imaging:  No results found.   PMFS History: Patient Active Problem List   Diagnosis Date Noted  . Spondylolisthesis of lumbar region 08/30/2016    Priority: High    Class: Chronic  . DDD (degenerative disc disease), lumbar 08/30/2016    Priority: High  . Spinal stenosis, lumbar region, with neurogenic claudication 08/30/2016    Priority: High    Class: Chronic  . Mucoid cyst of joint 11/20/2016  . Osteoarthritis of finger of right hand 11/20/2016  . Lumbar degenerative disc disease 08/30/2016  . Dizziness 06/12/2015  . ICD (implantable cardioverter-defibrillator), biventricular, in situ 02/16/2015  .  Chronic systolic heart failure ([place]) 02/16/2015  . Nonischemic cardiomyopathy ([place]) 01/19/2015  . Anemia, secondary to surgery 01/18/2015  . B12 deficiency 01/18/2015  . CHB (complete heart block)- post op 01/18/2015  . 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 Severe aortic stenosis 11/14/2014  . Pulmonary hypertension due to left ventricular systolic dysfunction ([place]) 11/09/2014  . Personal history of colonic polyps 07/30/2013  . COPD GOLD 0/ still smoking  07/07/2013  . CIS (carcinoma in situ)   . Osteopenia   . HSV-1 (herpes simplex virus 1) infection   . HSV-2 (herpes simplex virus 2) infection   . Bicuspid aortic valve   . Aortic aneurysm ([place])   . Adrenal tumor   . Degenerative disc disease   . Smoker    Past Medical History:  Diagnosis Date  . Adrenal tumor   . AICD (automatic cardioverter/defibrillator) present   . Anxiety   . Aortic aneurysm ([place])   . Asthma   . Bicuspid aortic valve    CONGENITAL  . Cancer ([place])    skin   . Carpal tunnel syndrome, bilateral   . Chronic depression   . Chronic sinusitis   . 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 COPD (chronic obstructive pulmonary disease) ([place])   . Coronary artery disease   . Degenerative disc disease    CERVICAL AND LUMBAR (DR. Lorin Mercy)  . GERD (gastroesophageal reflux disease)   . HSV-1 (herpes simplex virus 1) infection   . HSV-2 (herpes simplex virus 2) infection   . Hypertension   . Insomnia   . Lumbar stenosis   . Osteoarthritis   . Osteopenia   . Pernicious anemia   . Plantar fasciitis   . Smoker   . Venous insufficiency of left leg   . Vitamin B 12 deficiency     Family History  Problem Relation Age of Onset  . Pancreatic cancer Mother 51  . Diabetes Father   . Hypertension Father   . Heart disease Father 31       CAD  . Breast cancer Sister   . Diabetes Maternal Grandmother   . Hypertension Maternal Grandmother   . Cancer Maternal  Grandfather        colon or stomach  . Hypertension Paternal Grandmother   . Heart disease Paternal Grandmother        Later onset  . Diverticulosis Paternal Grandmother   . Asthma Grandchild     Past Surgical History:  Procedure Laterality Date  . AORTIC VALVE REPLACEMENT N/A 01/12/2015   Procedure: AORTIC VALVE REPLACEMENT (AVR);  Surgeon: Gaye Pollack, MD;  Location: [place];  Service: Open Heart Surgery;  Laterality: N/A;  . ASCENDING AORTIC ROOT REPLACEMENT N/A 01/12/2015   Procedure: ASCENDING AORTIC ROOT REPLACEMENT;  Surgeon: Gaye Pollack, MD;  Location: [place];  Service: Open  Heart Surgery;  Laterality: N/A;  . BI-VENTRICULAR PACEMAKER INSERTION N/A 01/18/2015   Procedure: BI-VENTRICULAR PACEMAKER INSERTION (CRT-P);  Surgeon: Evans Lance, MD;  [facility] CATH LAB;  Service: Cardiovascular;  Laterality: N/A;  . BUNIONECTOMY WITH HAMMERTOE RECONSTRUCTION Right   . CARPAL TUNNEL RELEASE Bilateral 2008  . CATARACT EXTRACTION Bilateral    X2  . CATARACT EXTRACTION W/ INTRAOCULAR LENS  IMPLANT, BILATERAL    . COLONOSCOPY    . CYST EXCISION     right side of throat  . EXCISION OF BASAL CELL CA     SKIN   . EXCISION OF VULVAR CIS    . EYE SURGERY    . FUNCTIONAL ENDOSCOPIC SINUS SURGERY    . LEFT AND RIGHT HEART CATHETERIZATION WITH CORONARY ANGIOGRAM N/A 09/23/2014   Procedure: LEFT AND RIGHT HEART CATHETERIZATION WITH CORONARY ANGIOGRAM;  Surgeon: Minus Breeding, MD;  Location: [facility] CATH LAB;  Service: Cardiovascular;  Laterality: N/A;  . MASS EXCISION Right 04/03/2017   Procedure: EXCISION CYST DEBRIDMENT DISTAL INTERPHALANEAL RIGHT INDEX FINGER;  Surgeon: Daryll Brod, MD;  Location: [place];  Service: Orthopedics;  Laterality: Right;  REG/FAB  . MULTIPLE TOOTH EXTRACTIONS    . SINUS PROCEDURE  2009   x 6  . TEE WITHOUT CARDIOVERSION N/A 10/06/2014   Procedure: TRANSESOPHAGEAL ECHOCARDIOGRAM (TEE);  Surgeon: Sueanne Margarita, MD;  Location: [facility] ENDOSCOPY;  Service: Cardiovascular;   Laterality: N/A;  . TEE WITHOUT CARDIOVERSION N/A 01/12/2015   Procedure: TRANSESOPHAGEAL ECHOCARDIOGRAM (TEE);  Surgeon: Gaye Pollack, MD;  Location: [place];  Service: Open Heart Surgery;  Laterality: N/A;  . UPPER GASTROINTESTINAL ENDOSCOPY     Social History   Occupational History  . retired Retired   Social History Main Topics  . Smoking status: Current Every Day Smoker    Packs/day: 0.50    Years: 50.00    Types: Cigarettes  . Smokeless tobacco: Never Used     Comment: uses vapor cig  . Alcohol use No  . Drug use: No  . Sexual activity: Not on file

## 2017-10-30 NOTE — Patient Instructions (Signed)
Avoid frequent bending and stooping  No lifting greater than 10 lbs. May use ice or moist heat for pain. Weight loss is of benefit. Handicap license is approved. Myelogram and post myelogram CT scan is ordered. Likely will need an exploration of the fusions at L3-4 and L4-5 with placement of cages on the right side and replacement of loosened screws.

## 2017-10-31 ENCOUNTER — Telehealth (INDEPENDENT_AMBULATORY_CARE_PROVIDER_SITE_OTHER): Payer: Self-pay | Admitting: Specialist

## 2017-10-31 NOTE — Telephone Encounter (Signed)
I called and advised patient that she needs to get the scan done, then give Carol Warren a couple of days to get her report, call Carol Warren and ask for her results then Dr. Louanne Skye or I would call her with the results, and depending on them we may go ahead a schedule surgery.  Patient states that she understands.

## 2017-10-31 NOTE — Telephone Encounter (Signed)
Patient was was here 11/1 and was told that her tests would be ready to go over in 3 weeks but was told by Dr. Louanne Skye that it was not necessary to come back to review the tests but her summary said to return for follow up in 3 weeks. Patient just needs to know if she needs to make an appointment, please call and let her know what to do. [phone]

## 2017-11-01 ENCOUNTER — Encounter: Payer: Self-pay | Admitting: Cardiology

## 2017-11-03 ENCOUNTER — Other Ambulatory Visit (INDEPENDENT_AMBULATORY_CARE_PROVIDER_SITE_OTHER): Payer: Self-pay | Admitting: Specialist

## 2017-11-03 DIAGNOSIS — Z981 Arthrodesis status: Secondary | ICD-10-CM

## 2017-11-03 MED ORDER — TRAMADOL-ACETAMINOPHEN 37.5-325 MG PO TABS: 1.0000 | ORAL_TABLET | Freq: Three times a day (TID) | ORAL | 0 refills | Status: DC | PRN

## 2017-11-04 ENCOUNTER — Telehealth: Payer: Self-pay | Admitting: Cardiology

## 2017-11-04 ENCOUNTER — Ambulatory Visit (INDEPENDENT_AMBULATORY_CARE_PROVIDER_SITE_OTHER): Payer: Medicare Other

## 2017-11-04 DIAGNOSIS — I5022 Chronic systolic (congestive) heart failure: Secondary | ICD-10-CM

## 2017-11-04 DIAGNOSIS — Z95810 Presence of automatic (implantable) cardiac defibrillator: Secondary | ICD-10-CM

## 2017-11-04 NOTE — Telephone Encounter (Signed)
Spoke with pt and reminded pt of remote transmission that is due today. Pt verbalized understanding.   

## 2017-11-04 NOTE — Progress Notes (Signed)
EPIC Encounter for ICM Monitoring  Patient Name: Carol Warren is a 71 y.o. female Date: 11/04/2017 Primary Care Physican: Ernestene Kiel, MD Primary Cardiologist:Hochrein Electrophysiologist: Lovena Le Dry Weight:125lbs  Bi-V Pacing: 98.5%       Heart Failure questions reviewed, pt asymptomatic.   Optivol: Thoracic impedance almost at baseline normal.  Prescribed dosage: Furosemide 40 mg 1 tablet daily.   Labs: 07/23/2017 Creatinine 0.94, BUN 15, Potassium 4.5, Sodium 130, EGFR 62-71 04/02/2017 Creatinine 1.26, BUN 20, Potassium 3.9, Sodium 134, EGFR 42-49 09/02/2017Creatinine 0.71, BUN 5, Potassium 3.5, Sodium 136, EGFR >60  08/25/2017Creatinine 0.94, BUN 12, Potassium 4.1, Sodium 135, EGFR >60   Recommendations: No changes.   Encouraged to call for fluid symptoms.  Follow-up plan: ICM clinic phone appointment on 12/08/2017.    Copy of ICM check sent to Dr. Lovena Le.   3 month ICM trend: 11/04/2017    1 Year ICM trend:       Rosalene Billings, RN 11/04/2017 4:47 PM

## 2017-11-06 ENCOUNTER — Encounter: Payer: Self-pay | Admitting: Cardiology

## 2017-11-10 ENCOUNTER — Telehealth (INDEPENDENT_AMBULATORY_CARE_PROVIDER_SITE_OTHER): Payer: Self-pay | Admitting: Specialist

## 2017-11-10 NOTE — Telephone Encounter (Signed)
Patient called stated she is in so much pain and have not heard from Carol Warren. She wants to know when because this pain is unbearable.  Please call her.

## 2017-11-11 ENCOUNTER — Encounter (INDEPENDENT_AMBULATORY_CARE_PROVIDER_SITE_OTHER): Payer: Self-pay | Admitting: Specialist

## 2017-11-11 NOTE — Telephone Encounter (Signed)
Patient would like her scan at [facility] Please

## 2017-11-11 NOTE — Telephone Encounter (Signed)
Noted and pt is already scheduled

## 2017-11-12 ENCOUNTER — Telehealth (INDEPENDENT_AMBULATORY_CARE_PROVIDER_SITE_OTHER): Payer: Self-pay | Admitting: Specialist

## 2017-11-12 NOTE — Telephone Encounter (Signed)
Per Gabriel Cirri this has been done

## 2017-11-12 NOTE — Telephone Encounter (Signed)
Patient called asking for the order for the [facility] to be sent to [facility] fax # [phone] Attn: Beacher May

## 2017-11-14 ENCOUNTER — Telehealth (INDEPENDENT_AMBULATORY_CARE_PROVIDER_SITE_OTHER): Payer: Self-pay | Admitting: Radiology

## 2017-11-14 NOTE — Telephone Encounter (Signed)
I spoke with patient and gave her the info for her appt for the CT Myelo, she is told by Oval Linsey that she will need to d/c the tramadol for 48 hours prior to the procedure and 48 hours after.  She says she cannot go that long without the tramadol.  She is asking for something to help with the pain, a medication that she will not have to d/c.  She is also asking if she can be admitted to the hospital for pain control prior to procedure.  Please call her to advise/discuss.

## 2017-11-17 ENCOUNTER — Other Ambulatory Visit (INDEPENDENT_AMBULATORY_CARE_PROVIDER_SITE_OTHER): Payer: Self-pay | Admitting: Specialist

## 2017-11-17 NOTE — Telephone Encounter (Signed)
I had a voice mail as well from patient, stating that she cannot go without pain meds for 5 days.  She will have to d/c the tramadol 2 days prior to procedure and stay off it for 2 days post procedure.  Please call her [phone]

## 2017-11-17 NOTE — Telephone Encounter (Signed)
Patient called asked for a call back concerning needing something other than Tramadol. Patient asked if she can get a call back as soon as possible. The number to contact is [phone]

## 2017-11-18 ENCOUNTER — Other Ambulatory Visit (INDEPENDENT_AMBULATORY_CARE_PROVIDER_SITE_OTHER): Payer: Self-pay | Admitting: Specialist

## 2017-11-18 MED ORDER — ACETAMINOPHEN-CODEINE #3 300-30 MG PO TABS: 1.0000 | ORAL_TABLET | Freq: Three times a day (TID) | ORAL | 0 refills | Status: DC | PRN

## 2017-11-18 NOTE — Telephone Encounter (Signed)
Carol Warren called again, she needs to know if she can have something else for pain while she has to stop the tramadol.  She has to know by 12 noon today if you are able to give her something else, or she will need to cancel the CT Myelo at [facility] by noon today, so they can use the appt slot for someone else.  She cannot go without something for pain.

## 2017-11-18 NOTE — Telephone Encounter (Signed)
I called and advised patient that we are sending in Tylenol #3 for her to take during this time.  I called the rx to [facility]

## 2017-11-18 NOTE — Progress Notes (Unsigned)
Patient is to have a myelogram and post myelogram CT scan and it required to come off tramadol due to increase risk of seizure with the use of myelograph dye or contrast.

## 2017-11-27 ENCOUNTER — Telehealth (INDEPENDENT_AMBULATORY_CARE_PROVIDER_SITE_OTHER): Payer: Self-pay | Admitting: Specialist

## 2017-11-27 NOTE — Telephone Encounter (Signed)
Patient called asking if Dr. Louanne Skye had a chance to look over the CT scan and if he would give her a call back with the reults/what to do next. CB # [phone]

## 2017-11-28 ENCOUNTER — Other Ambulatory Visit (INDEPENDENT_AMBULATORY_CARE_PROVIDER_SITE_OTHER): Payer: Self-pay | Admitting: Radiology

## 2017-11-28 DIAGNOSIS — Z981 Arthrodesis status: Secondary | ICD-10-CM

## 2017-11-28 MED ORDER — TRAMADOL-ACETAMINOPHEN 37.5-325 MG PO TABS: 1.0000 | ORAL_TABLET | Freq: Three times a day (TID) | ORAL | 0 refills | Status: DC | PRN

## 2017-11-28 NOTE — Telephone Encounter (Signed)
Ok refill thanks

## 2017-11-28 NOTE — Telephone Encounter (Signed)
Patient is calling requesting a refill on her pain meds.  Ultracet 37.5-325mg .  Can you have Dr. Lorin Mercy to advise on this?

## 2017-11-28 NOTE — Telephone Encounter (Signed)
Can you advise? 

## 2017-11-28 NOTE — Telephone Encounter (Signed)
Called to pharmacy. Patient advised.

## 2017-12-02 NOTE — Telephone Encounter (Signed)
Patient is coming on 12/03/17 to review

## 2017-12-03 ENCOUNTER — Encounter (INDEPENDENT_AMBULATORY_CARE_PROVIDER_SITE_OTHER): Payer: Self-pay | Admitting: Specialist

## 2017-12-03 ENCOUNTER — Ambulatory Visit (INDEPENDENT_AMBULATORY_CARE_PROVIDER_SITE_OTHER): Payer: Medicare Other | Admitting: Specialist

## 2017-12-03 VITALS — BP 154/94 | HR 115 | Ht 61.0 in | Wt 124.0 lb

## 2017-12-03 DIAGNOSIS — M48062 Spinal stenosis, lumbar region with neurogenic claudication: Secondary | ICD-10-CM

## 2017-12-03 DIAGNOSIS — M960 Pseudarthrosis after fusion or arthrodesis: Secondary | ICD-10-CM

## 2017-12-03 DIAGNOSIS — T84498A Other mechanical complication of other internal orthopedic devices, implants and grafts, initial encounter: Secondary | ICD-10-CM | POA: Diagnosis not present

## 2017-12-03 MED ORDER — HYDROCODONE-ACETAMINOPHEN 5-325 MG PO TABS: 1.0000 | ORAL_TABLET | Freq: Four times a day (QID) | ORAL | 0 refills | Status: DC | PRN

## 2017-12-03 NOTE — Progress Notes (Signed)
Office Visit Note   Patient: Carol Warren           Date of Birth: 12-05-1946           MRN: [id number] Visit Date: 12/03/2017              Requested by: Ernestene Kiel, MD [address] [place], [place] [address] PCP: Ernestene Kiel, MD   Assessment & Plan: Visit Diagnoses:  1. Pseudarthrosis after fusion or arthrodesis   2. Spinal stenosis of lumbar region with neurogenic claudication   3. Loosening of hardware in spine (HCC)     Plan:Avoid bending, stooping and avoid lifting weights greater than 10 lbs. Avoid prolong standing and walking. Order for a new walker with wheels. Surgery scheduling secretary Kandice Hams, will call you in the next week to schedule for surgery.  Surgery recommended is a two level lumbar fusion exploration L3-4  And L4-5  this would be done with removal of loosened rods, screws and placement of right side cages within the disc spaces at L3-4 and L4-5 with local bone graft and allograft (donor bone graft). Then reinsertion of rods and screws. Take hydrocodone for for pain. Risk of surgery includes risk of infection 1 in 300 patients, bleeding 1/2% chance you would need a transfusion.   Risk to the nerves is one in 10,000. You will need to use a brace for 3 months and wean from the brace on the 4th month. Expect improved walking and standing tolerance. Expect relief of leg pain but numbness may persist depending on the length and degree of pressure that has been present.  Follow-Up Instructions: Return in about 6 weeks (around 01/14/2018).   Orders:  No orders of the defined types were placed in this encounter.  No orders of the defined types were placed in this encounter.     Procedures: No procedures performed   Clinical Data: No additional findings.   Subjective: Chief Complaint  Patient presents with  . Lower Back - Follow-up    CT Myelogram Review    71 year old female with left sided L3-4 and L4-5 TLIFs 08/30/2017, she is  experiencing  Persistent back and now right greater than left leg pain. Previous CT without contrast suggests pseudarthrosis L3-4 and L4-5 TLIFs. She is unable to stand or walk any distance and is having pain both inner thighs and into the knees. She can not grocery shop due to inability to stand. Pain is present even with sitting. Notices weakness generally but pain on a scale of 1-10  The pain is a 7. Results of the myelogram and post myelogram CT scan shows peudoarthrosis L3-4 and L4-5. No bowel or bladder difficulty.    Review of Systems  Constitutional: Negative.   HENT: Negative.   Eyes: Negative.   Respiratory: Negative.   Cardiovascular: Negative.   Gastrointestinal: Negative.   Endocrine: Negative.   Genitourinary: Negative.   Musculoskeletal: Negative.   Skin: Negative.   Allergic/Immunologic: Negative.   Neurological: Negative.   Hematological: Negative.   Psychiatric/Behavioral: Negative.      Objective: Vital Signs: BP (!) 154/94 (BP Location: Left Arm, Patient Position: Sitting)   Pulse (!) 115   Ht 5\' 1"  (1.549 m)   Wt 124 lb (56.2 kg)   BMI 23.43 kg/m   Physical Exam  Constitutional: She is oriented to person, place, and time. She appears well-developed and well-nourished.  HENT:  Head: Normocephalic and atraumatic.  Eyes: EOM are normal. Pupils are equal,  round, and reactive to light.  Neck: Normal range of motion. Neck supple.  Pulmonary/Chest: Effort normal and breath sounds normal.  Abdominal: Soft. Bowel sounds are normal.  Neurological: She is alert and oriented to person, place, and time.  Skin: Skin is warm and dry.  Psychiatric: She has a normal mood and affect. Her behavior is normal. Judgment and thought content normal.    Back Exam   Tenderness  The patient is experiencing tenderness in the lumbar.  Range of Motion  Extension: abnormal  Flexion: normal  Lateral bend right: abnormal  Lateral bend left: abnormal  Rotation right: abnormal    Rotation left: abnormal   Muscle Strength  The patient has normal back strength. Right Quadriceps:  5/5  Left Quadriceps:  5/5  Right Hamstrings:  5/5  Left Hamstrings:  5/5   Tests  Straight leg raise right: negative Straight leg raise left: negative  Reflexes  Patellar: normal Achilles: normal Biceps: normal Babinski's sign: normal   Other  Toe walk: normal Heel walk: normal Sensation: normal Erythema: no back redness Scars: absent      Specialty Comments:  No specialty comments available.  Imaging: No results found.   PMFS History: Patient Active Problem List   Diagnosis Date Noted  . Spondylolisthesis of lumbar region 08/30/2016    Priority: High    Class: Chronic  . DDD (degenerative disc disease), lumbar 08/30/2016    Priority: High  . Spinal stenosis, lumbar region, with neurogenic claudication 08/30/2016    Priority: High    Class: Chronic  . Mucoid cyst of joint 11/20/2016  . Osteoarthritis of finger of right hand 11/20/2016  . Lumbar degenerative disc disease 08/30/2016  . Dizziness 06/12/2015  . ICD (implantable cardioverter-defibrillator), biventricular, in situ 02/16/2015  . Chronic systolic heart failure ([place]) 02/16/2015  . Nonischemic cardiomyopathy ([place]) 01/19/2015  . Anemia, secondary to surgery 01/18/2015  . B12 deficiency 01/18/2015  . CHB (complete heart block)- post op 01/18/2015  . 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 Severe aortic stenosis 11/14/2014  . Pulmonary hypertension due to left ventricular systolic dysfunction ([place]) 11/09/2014  . Personal history of colonic polyps 07/30/2013  . COPD GOLD 0/ still smoking  07/07/2013  . CIS (carcinoma in situ)   . Osteopenia   . HSV-1 (herpes simplex virus 1) infection   . HSV-2 (herpes simplex virus 2) infection   . Bicuspid aortic valve   . Aortic aneurysm ([place])   . Adrenal tumor   . Degenerative disc disease   . Smoker     Past Medical History:  Diagnosis Date  . Adrenal tumor   . AICD (automatic cardioverter/defibrillator) present   . Anxiety   . Aortic aneurysm ([place])   . Asthma   . Bicuspid aortic valve    CONGENITAL  . Cancer ([place])    skin   . Carpal tunnel syndrome, bilateral   . Chronic depression   . Chronic sinusitis   . 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 COPD (chronic obstructive pulmonary disease) ([place])   . Coronary artery disease   . Degenerative disc disease    CERVICAL AND LUMBAR (DR. Lorin Mercy)  . GERD (gastroesophageal reflux disease)   . HSV-1 (herpes simplex virus 1) infection   . HSV-2 (herpes simplex virus 2) infection   . Hypertension   . Insomnia   . Lumbar stenosis   . Osteoarthritis   . Osteopenia   . Pernicious anemia   .  Plantar fasciitis   . Smoker   . Venous insufficiency of left leg   . Vitamin B 12 deficiency     Family History  Problem Relation Age of Onset  . Pancreatic cancer Mother 60  . Diabetes Father   . Hypertension Father   . Heart disease Father 4       CAD  . Breast cancer Sister   . Diabetes Maternal Grandmother   . Hypertension Maternal Grandmother   . Cancer Maternal Grandfather        colon or stomach  . Hypertension Paternal Grandmother   . Heart disease Paternal Grandmother        Later onset  . Diverticulosis Paternal Grandmother   . Asthma Grandchild     Past Surgical History:  Procedure Laterality Date  . AORTIC VALVE REPLACEMENT N/A 01/12/2015   Procedure: AORTIC VALVE REPLACEMENT (AVR);  Surgeon: Gaye Pollack, MD;  Location: [place] [facility];  Service: Open Heart Surgery;  Laterality: N/A;  . ASCENDING AORTIC ROOT REPLACEMENT N/A 01/12/2015   Procedure: ASCENDING AORTIC ROOT REPLACEMENT;  Surgeon: Gaye Pollack, MD;  Location: [facility];  Service: Open Heart Surgery;  Laterality: N/A;  . BI-VENTRICULAR PACEMAKER INSERTION N/A 01/18/2015   Procedure: BI-VENTRICULAR PACEMAKER INSERTION (CRT-P);  Surgeon: Evans Lance, MD;  Location:  [facility] CATH LAB;  Service: Cardiovascular;  Laterality: N/A;  . BUNIONECTOMY WITH HAMMERTOE RECONSTRUCTION Right   . CARPAL TUNNEL RELEASE Bilateral 2008  . CATARACT EXTRACTION Bilateral    X2  . CATARACT EXTRACTION W/ INTRAOCULAR LENS  IMPLANT, BILATERAL    . COLONOSCOPY    . CYST EXCISION     right side of throat  . EXCISION OF BASAL CELL CA     SKIN   . EXCISION OF VULVAR CIS    . EYE SURGERY    . FUNCTIONAL ENDOSCOPIC SINUS SURGERY    . LEFT AND RIGHT HEART CATHETERIZATION WITH CORONARY ANGIOGRAM N/A 09/23/2014   Procedure: LEFT AND RIGHT HEART CATHETERIZATION WITH CORONARY ANGIOGRAM;  Surgeon: Minus Breeding, MD;  Location: [facility] CATH LAB;  Service: Cardiovascular;  Laterality: N/A;  . MASS EXCISION Right 04/03/2017   Procedure: EXCISION CYST DEBRIDMENT DISTAL INTERPHALANEAL RIGHT INDEX FINGER;  Surgeon: Daryll Brod, MD;  [facility];  Service: Orthopedics;  Laterality: Right;  REG/FAB  . MULTIPLE TOOTH EXTRACTIONS    . SINUS PROCEDURE  2009   x 6  . TEE WITHOUT CARDIOVERSION N/A 10/06/2014   Procedure: TRANSESOPHAGEAL ECHOCARDIOGRAM (TEE);  Surgeon: Sueanne Margarita, MD;  Location: [facility] ENDOSCOPY;  Service: Cardiovascular;  Laterality: N/A;  . TEE WITHOUT CARDIOVERSION N/A 01/12/2015   Procedure: TRANSESOPHAGEAL ECHOCARDIOGRAM (TEE);  Surgeon: Gaye Pollack, MD;  Location: [place] [facility];  Service: Open Heart Surgery;  Laterality: N/A;  . UPPER GASTROINTESTINAL ENDOSCOPY     Social History   Occupational History  . Occupation: retired    Fish farm manager: RETIRED  Tobacco Use  . Smoking status: Current Every Day Smoker    Packs/day: 0.50    Years: 50.00    Pack years: 25.00    Types: Cigarettes  . Smokeless tobacco: Never Used  . Tobacco comment: uses vapor cig  Substance and Sexual Activity  . Alcohol use: No    Alcohol/week: 0.0 oz  . Drug use: No  . Sexual activity: Not on file

## 2017-12-03 NOTE — Patient Instructions (Signed)
Avoid bending, stooping and avoid lifting weights greater than 10 lbs. Avoid prolong standing and walking. Order for a new walker with wheels. Surgery scheduling secretary Kandice Hams, will call you in the next week to schedule for surgery.  Surgery recommended is a two level lumbar fusion exploration L3-4  And L4-5  this would be done with removal of loosened rods, screws and placement of right side cages within the disc spaces at L3-4 and L4-5 with local bone graft and allograft (donor bone graft). Then reinsertion of rods and screws. Take hydrocodone for for pain. Risk of surgery includes risk of infection 1 in 300 patients, bleeding 1/2% chance you would need a transfusion.   Risk to the nerves is one in 10,000. You will need to use a brace for 3 months and wean from the brace on the 4th month. Expect improved walking and standing tolerance. Expect relief of leg pain but numbness may persist depending on the length and degree of pressure that has been present.

## 2017-12-05 ENCOUNTER — Encounter (INDEPENDENT_AMBULATORY_CARE_PROVIDER_SITE_OTHER): Payer: Self-pay | Admitting: Specialist

## 2017-12-08 ENCOUNTER — Ambulatory Visit (INDEPENDENT_AMBULATORY_CARE_PROVIDER_SITE_OTHER): Payer: Medicare Other | Admitting: *Deleted

## 2017-12-08 DIAGNOSIS — I5022 Chronic systolic (congestive) heart failure: Secondary | ICD-10-CM

## 2017-12-08 DIAGNOSIS — Z95810 Presence of automatic (implantable) cardiac defibrillator: Secondary | ICD-10-CM | POA: Diagnosis not present

## 2017-12-08 DIAGNOSIS — I428 Other cardiomyopathies: Secondary | ICD-10-CM | POA: Diagnosis not present

## 2017-12-08 NOTE — Progress Notes (Signed)
Remote ICD transmission.   

## 2017-12-11 ENCOUNTER — Other Ambulatory Visit (INDEPENDENT_AMBULATORY_CARE_PROVIDER_SITE_OTHER): Payer: Self-pay | Admitting: Specialist

## 2017-12-11 ENCOUNTER — Telehealth: Payer: Self-pay | Admitting: *Deleted

## 2017-12-11 LAB — CUP PACEART REMOTE DEVICE CHECK
Battery Voltage: 2.95 V
Brady Statistic AS VP Percent: 98.8 %
Brady Statistic AS VS Percent: 0.45 %
Date Time Interrogation Session: 20181210093722
HIGH POWER IMPEDANCE MEASURED VALUE: 65 Ohm
Implantable Lead Implant Date: 20160120
Implantable Lead Location: 753858
Implantable Lead Location: 753859
Implantable Lead Model: 4598
Implantable Lead Model: 6935
Implantable Pulse Generator Implant Date: 20160120
Lead Channel Impedance Value: 418 Ohm
Lead Channel Impedance Value: 551 Ohm
Lead Channel Impedance Value: 646 Ohm
Lead Channel Impedance Value: 836 Ohm
Lead Channel Impedance Value: 874 Ohm
Lead Channel Impedance Value: 893 Ohm
Lead Channel Pacing Threshold Amplitude: 0.375 V
Lead Channel Pacing Threshold Amplitude: 2 V
Lead Channel Pacing Threshold Pulse Width: 0.4 ms
Lead Channel Pacing Threshold Pulse Width: 0.4 ms
Lead Channel Pacing Threshold Pulse Width: 0.4 ms
Lead Channel Sensing Intrinsic Amplitude: 1 mV
Lead Channel Setting Pacing Amplitude: 2 V
Lead Channel Setting Pacing Pulse Width: 0.4 ms
MDC IDC LEAD IMPLANT DT: 20160120
MDC IDC LEAD IMPLANT DT: 20160120
MDC IDC LEAD LOCATION: 753860
MDC IDC MSMT BATTERY REMAINING LONGEVITY: 30 mo
MDC IDC MSMT LEADCHNL LV IMPEDANCE VALUE: 342 Ohm
MDC IDC MSMT LEADCHNL LV IMPEDANCE VALUE: 418 Ohm
MDC IDC MSMT LEADCHNL LV IMPEDANCE VALUE: 475 Ohm
MDC IDC MSMT LEADCHNL LV IMPEDANCE VALUE: 589 Ohm
MDC IDC MSMT LEADCHNL LV IMPEDANCE VALUE: 665 Ohm
MDC IDC MSMT LEADCHNL RA IMPEDANCE VALUE: 361 Ohm
MDC IDC MSMT LEADCHNL RA SENSING INTR AMPL: 1 mV
MDC IDC MSMT LEADCHNL RV IMPEDANCE VALUE: 646 Ohm
MDC IDC MSMT LEADCHNL RV PACING THRESHOLD AMPLITUDE: 0.625 V
MDC IDC MSMT LEADCHNL RV SENSING INTR AMPL: 9.125 mV
MDC IDC MSMT LEADCHNL RV SENSING INTR AMPL: 9.125 mV
MDC IDC SET LEADCHNL LV PACING AMPLITUDE: 3 V
MDC IDC SET LEADCHNL RA PACING AMPLITUDE: 1.5 V
MDC IDC SET LEADCHNL RV PACING PULSEWIDTH: 0.4 ms
MDC IDC SET LEADCHNL RV SENSING SENSITIVITY: 0.45 mV
MDC IDC STAT BRADY AP VP PERCENT: 0.74 %
MDC IDC STAT BRADY AP VS PERCENT: 0.01 %
MDC IDC STAT BRADY RA PERCENT PACED: 0.75 %
MDC IDC STAT BRADY RV PERCENT PACED: 98.68 %

## 2017-12-11 NOTE — Telephone Encounter (Signed)
Norco refill request 

## 2017-12-11 NOTE — Telephone Encounter (Signed)
Nitka patient 

## 2017-12-11 NOTE — Telephone Encounter (Signed)
    [facility] Pre-operative Risk Assessment    Request for surgical clearance:  1. What type of surgery is being performed? EXPLORATION FUSION L3-4, L4-5 WITH REMOVAL OF HARDWARE RODS & SCREWS   2. When is this surgery scheduled? TBD   3. Are there any medications that need to be held prior to surgery and how long?  4. Practice name and name of physician performing surgery?  [facility]   5. What is your office phone and fax number? [place]    6. Anesthesia type (None, local, MAC, general) ? UNKNOWN

## 2017-12-11 NOTE — Telephone Encounter (Signed)
   Primary Cardiologist: No primary care provider on file.  Chart reviewed as part of pre-operative protocol coverage. Patient was contacted 12/11/2017 in reference to pre-operative risk assessment for pending surgery as outlined below.  Carol Warren was last seen on 08/04/2017 by Dr. Percival Spanish, MD. Since that day, Carol Warren has done well from a cardiac perspective. She denies any chest pain, SOB, palpitations, or DOE. She can achieve 7.25 METs. She does have a history of a prosthetic aortic valve and will be undergoing musculoskeletal surgery, therefor I will route to pharmacy for review of SBE prophylaxis. Once this input is received, covering APP please bundle into a clearance note and fax to treating MD.   Therefore, based on ACC/AHA guidelines, the patient would be at acceptable risk for the planned procedure without further cardiovascular testing.   I will route this recommendation to the requesting party via Epic fax function and remove from pre-op pool.  Please call with questions.  Carol Faith, PA-C 12/11/2017, 3:55 PM

## 2017-12-12 ENCOUNTER — Encounter: Payer: Self-pay | Admitting: Cardiology

## 2017-12-12 NOTE — Telephone Encounter (Signed)
See multiple notes.  Pt is cleared see below.

## 2017-12-12 NOTE — Telephone Encounter (Signed)
No SBE prophylaxis is required prior to spinal procedures.

## 2017-12-12 NOTE — Progress Notes (Signed)
EPIC Encounter for ICM Monitoring  Patient Name: Carol Warren is a 71 y.o. female Date: 12/12/2017 Primary Care Physican: Ernestene Kiel, MD Primary Cardiologist:Hochrein Electrophysiologist: Lovena Le Dry Weight: Previous weight 125lbs  Bi-V Pacing: 98.7%       Transmission received   Thoracic impedance normal  Prescribed dosage: Furosemide 40 mg 1 tablet daily.   Labs: 07/23/2017 Creatinine 0.94, BUN 15, Potassium 4.5, Sodium 130, EGFR 62-71 04/02/2017 Creatinine 1.26, BUN 20, Potassium 3.9, Sodium 134, EGFR 42-49 09/02/2017Creatinine 0.71, BUN 5, Potassium 3.5, Sodium 136, EGFR >60  08/25/2017Creatinine 0.94, BUN 12, Potassium 4.1, Sodium 135, EGFR >60  Recommendations: No changes.   Follow-up plan: ICM clinic phone appointment on 01/09/2018.    Copy of ICM check sent to Dr. Lovena Le.   3 month ICM trend: 12/08/2017    1 Year ICM trend:       Rosalene Billings, RN 12/12/2017 6:40 PM

## 2017-12-15 NOTE — Telephone Encounter (Signed)
Patient called again saying she needs refill on Hydrocodone, she sent MyChart message on 12/13. She just ran out of it and she needs it called in if possible because she cant drive. CB  # [phone]

## 2017-12-15 NOTE — Telephone Encounter (Signed)
Norco refill request 

## 2017-12-16 NOTE — Telephone Encounter (Signed)
Patient called back to check on status of refill on Hydrocodone. She is very upset and said she "expects" it to be ready tomorrow morning for pick up.Meriel Flavors # [phone]

## 2017-12-16 NOTE — Telephone Encounter (Signed)
Patient called back to check on status of refill on Hydrocodone. She is very upset and said she "expects" it to be ready tomorrow morning for pick up

## 2017-12-17 ENCOUNTER — Telehealth (INDEPENDENT_AMBULATORY_CARE_PROVIDER_SITE_OTHER): Payer: Self-pay | Admitting: Specialist

## 2017-12-17 MED ORDER — HYDROCODONE-ACETAMINOPHEN 5-325 MG PO TABS: 1.0000 | ORAL_TABLET | Freq: Four times a day (QID) | ORAL | 0 refills | Status: DC | PRN

## 2017-12-17 NOTE — Telephone Encounter (Signed)
Patient called very upset about prescription that she requested to be refilled by now. Please call this patient as she has threaten several reps up front about this not being ready.  Stated she will come out better going to the "street corner" to get this.  Thanks

## 2017-12-17 NOTE — Telephone Encounter (Signed)
Patient's husband came to office and picked up the Rx

## 2017-12-17 NOTE — Telephone Encounter (Signed)
I spoke with patient and she is aware that Dr Louanne Skye has the request for the Rx refill hydrocodone.  I will call her to advise further this afternoon.

## 2017-12-25 ENCOUNTER — Telehealth (INDEPENDENT_AMBULATORY_CARE_PROVIDER_SITE_OTHER): Payer: Self-pay | Admitting: Specialist

## 2017-12-25 NOTE — Telephone Encounter (Signed)
She was wondering about a 60 or 90 day supply.

## 2017-12-25 NOTE — Telephone Encounter (Signed)
Please advise if ok to rf norco?

## 2017-12-25 NOTE — Telephone Encounter (Signed)
Patient called asking if she could get a refill on her hydrocodone. CB# [phone]

## 2017-12-26 NOTE — Telephone Encounter (Signed)
I can only prescribe narcotics for one week supply. Carol Warren

## 2017-12-29 MED ORDER — HYDROCODONE-ACETAMINOPHEN 5-325 MG PO TABS: 1.0000 | ORAL_TABLET | Freq: Four times a day (QID) | ORAL | 0 refills | Status: DC | PRN

## 2017-12-29 NOTE — Telephone Encounter (Signed)
I called and spoke with patient advised that Dr. Louanne Skye could only write for one week supply of narcotics. She is aggravated because she is waiting to get on surgery schedule. I apologized. She would like to know who she can charge her gas money to when she has to drive up here for prescription. Advised this is law enforced by her [place] government only allowing physicians to write a week at a time. Can we check on status of surgery, I'm not sure if she needs cardiac clearance or not?

## 2017-12-29 NOTE — Addendum Note (Signed)
Addended by: Maxcine Ham on: 12/29/2017 09:43 AM   Modules accepted: Orders

## 2017-12-31 ENCOUNTER — Encounter: Payer: Self-pay | Admitting: Internal Medicine

## 2017-12-31 ENCOUNTER — Telehealth (INDEPENDENT_AMBULATORY_CARE_PROVIDER_SITE_OTHER): Payer: Self-pay

## 2017-12-31 ENCOUNTER — Encounter: Payer: Self-pay | Admitting: Cardiology

## 2017-12-31 NOTE — Telephone Encounter (Signed)
Pt called and states that she has a complaint with not getting her narcotic pain medication. States that she called on the 20th of december and that she has not received a call back. Also that she has not been scheduled for her surgery. Advised pt that she is pending cardiac clearance and that she was called on 12/29/17 to advise that the rx is available for a one week supply for pick up at the front desk. Pt continues to state that she "might as well come and sit in the lobby and wait if I have to come up there once a week" pt states " go ahead and keep your paper I spend that much in gas to come up there and pick up just a 1 week supply" pt then hung up the phone. I am not sure where we stand with surgical clearance and advised that I would have someone call to check on this prior to the disconnection. Wanted to make you both aware.

## 2017-12-31 NOTE — Telephone Encounter (Signed)
I do not function well as pain management  and she is aware that I am not able to fill more than a weeks worth of narcotics at a time. Surgery is pending. If she can do without narcotics she is better off in terms of our ability to relieve pain around the time of surgery. The narcotics are not the treatment but can become a problem due to  prolong use.  jen

## 2017-12-31 NOTE — Telephone Encounter (Signed)
Pt called and states that she has a complaint with not getting her narcotic pain medication. States that she called on the 20th of december and that she has not received a call back. Also that she has not been scheduled for her surgery. Advised pt that she is pending cardiac clearance and that she was called on 12/29/17 to advise that the rx is available for a one week supply for pick up at the front desk. Pt continues to state that she "might as well come and sit in the lobby and wait if I have to come up there once a week" pt states " go ahead and keep your paper I spend that much in gas to come up there and pick up just a 1 week supply" pt then hung up the phone. I am not sure where we stand with surgical clearance and advised that I would have someone call to check on this prior to the disconnection. Wanted to make you both aware

## 2018-01-01 ENCOUNTER — Encounter (INDEPENDENT_AMBULATORY_CARE_PROVIDER_SITE_OTHER): Payer: Self-pay | Admitting: Specialist

## 2018-01-02 NOTE — Telephone Encounter (Signed)
I called and spoke with patient on 01/01/2018. She states that she did come and get her Rx on 12/31/2017, I did advise that we can only rx pain meds for 1 week at a time, that this is all the laws and insurances will allow. She states that she understands that but its not fair for her to have to suffer and that she can't drive to [place] once a week to get her rx, and that she is having trouble getting in the car to go 2 blocks to the pharmacy.  I did advise that once we have all the clearances from her drs that we could get her scheduled for surgery, she statd that she has an appt with her PCP on 01/09/2018 for clearance eval.

## 2018-01-08 ENCOUNTER — Other Ambulatory Visit (INDEPENDENT_AMBULATORY_CARE_PROVIDER_SITE_OTHER): Payer: Self-pay | Admitting: Specialist

## 2018-01-08 NOTE — Telephone Encounter (Signed)
Patient called requesting an RX refill on her Hydrocodone.  She will be out on Sunday.  CB#[phone].  Thank you.

## 2018-01-09 ENCOUNTER — Ambulatory Visit (INDEPENDENT_AMBULATORY_CARE_PROVIDER_SITE_OTHER): Payer: Medicare Other

## 2018-01-09 ENCOUNTER — Telehealth: Payer: Self-pay

## 2018-01-09 ENCOUNTER — Telehealth: Payer: Self-pay | Admitting: Cardiology

## 2018-01-09 DIAGNOSIS — I5022 Chronic systolic (congestive) heart failure: Secondary | ICD-10-CM

## 2018-01-09 DIAGNOSIS — Z95810 Presence of automatic (implantable) cardiac defibrillator: Secondary | ICD-10-CM | POA: Diagnosis not present

## 2018-01-09 MED ORDER — HYDROCODONE-ACETAMINOPHEN 5-325 MG PO TABS: 1.0000 | ORAL_TABLET | Freq: Four times a day (QID) | ORAL | 0 refills | Status: DC | PRN

## 2018-01-09 NOTE — Telephone Encounter (Signed)
Remote ICM transmission received.  Attempted call to patient and no answer   

## 2018-01-09 NOTE — Telephone Encounter (Signed)
lmom that her rx is ready for pick up at the front desk and that it has to be picked up

## 2018-01-09 NOTE — Telephone Encounter (Signed)
   F/u from clinic phone call earlier today. Device clinic attempted to call pt regarding device transmission and recs regarding diuretics as outlined below.   Attempted call to patient and unable to reach.   Transmission reviewed.    Thoracic impedance slightly abnormal suggesting fluid accumulation.  Prescribed dosage: Furosemide 40 mg 1 tablet daily.   Labs: 07/23/2017 Creatinine 0.94, BUN 15, Potassium 4.5, Sodium 130, EGFR 62-71 04/02/2017 Creatinine 1.26, BUN 20, Potassium 3.9, Sodium 134, EGFR 42-49 09/02/2017Creatinine 0.71, BUN 5, Potassium 3.5, Sodium 136, EGFR >60  08/25/2017Creatinine 0.94, BUN 12, Potassium 4.1, Sodium 135, EGFR >60  Pt call back after hours due to missed phone call. Pt notified of recs, but states that she is already taking 40 mg of Lasix daily and has been fully compliant. Pt instructed to increase to 40 mg BID x 2 days and to call back on Monday when the device clinic reopens for further recommendations. Pt agrees to f/u on Monday.     

## 2018-01-09 NOTE — Progress Notes (Signed)
EPIC Encounter for ICM Monitoring  Patient Name: Carol Warren is a 71 y.o. female Date: 01/09/2018 Primary Care Physican: Prochnau, Caroline, MD Primary Cardiologist:Hochrein Electrophysiologist: Taylor Dry Weight: Previous weight 125lbs  Bi-V Pacing: 97.7%         Attempted call to patient and unable to reach.   Transmission reviewed.    Thoracic impedance slightly abnormal suggesting fluid accumulation.  Prescribed dosage: Furosemide 40 mg 1 tablet daily.   Labs: 07/23/2017 Creatinine 0.94, BUN 15, Potassium 4.5, Sodium 130, EGFR 62-71 04/02/2017 Creatinine 1.26, BUN 20, Potassium 3.9, Sodium 134, EGFR 42-49 09/02/2017Creatinine 0.71, BUN 5, Potassium 3.5, Sodium 136, EGFR >60  08/25/2017Creatinine 0.94, BUN 12, Potassium 4.1, Sodium 135, EGFR >60  Recommendations: NONE - Unable to reach.  Follow-up plan: ICM clinic phone appointment on 03/16/2018.   Office appointment with Dr Taylor on 02/11/2018.  Copy of ICM check sent to Dr. Taylor and Dr. Hochrein.   3 month ICM trend: 01/09/2018    1 Year ICM trend:        S , RN 01/09/2018 10:44 AM   

## 2018-01-12 ENCOUNTER — Telehealth: Payer: Self-pay | Admitting: Cardiology

## 2018-01-12 NOTE — Telephone Encounter (Signed)
Returned patient call. See ICM note 

## 2018-01-12 NOTE — Telephone Encounter (Signed)
New message   Patient returning call from device clinic on 1/11. Please see 1/11 note. Please call   1. Has your device fired? NO  2. Is you device beeping? NO  3. Are you experiencing draining or swelling at device site? NO  4. Are you calling to see if we received your device transmission? NO  5. Have you passed out? NO   Please route to [place]

## 2018-01-12 NOTE — Progress Notes (Addendum)
Returned call to patient as requested.  Patient said she called back after hours on 01/09/2018 and was told to take extra Furosemide x 2 days.  She said she is feeling fine and is asymptomatic at this time.  She said she drinks > 64 oz daily.  Weight stable at 124 lbs.  She said she worries about having the dx of heart failure and advised to discuss with Dr Percival Spanish at appointment on 02/02/2018.  She will be having back surgery again in the near future. Office appointment with Dr Lovena Le on 02/11/2018. Next ICM remote transmission 03/16/2018.

## 2018-01-14 ENCOUNTER — Telehealth: Payer: Self-pay | Admitting: Internal Medicine

## 2018-01-14 NOTE — Telephone Encounter (Signed)
Carol Warren reports pain in her chest when she coughs and breathes deeply. She reports that she has been congested lately and was worried something was wrong with her ICD. She denies redness, swelling or drainage from her ICD pocket. I instructed her to send a transmission.  Transmission received- normal device function. Thoracic impedance abnormal over Dec-Jan. She took a double dose of her Lasix on Saturday and Sunday. She is relieved that her ICD is functioning properly, but is concerned about fluid. I instructed her to take her normal dose of lasix and limit her fluid intake to < 2 liters today. She reports that her oral intake is normally a problem for her d/t dry mouth. I will send this call to Sharman Cheek, RN for HF management. Last BMP in April.

## 2018-01-14 NOTE — Telephone Encounter (Signed)
New message   1. Has your device fired? No  2. Is you device beeping? No  3. Are you experiencing draining or swelling at device site? No  4. Are you calling to see if we received your device transmission? No  5. Have you passed out? NO  Patient says that she is having pain at the device site when she breaths, SOB, and she has a sinus infections please call    Please route to [place]

## 2018-01-15 NOTE — Telephone Encounter (Signed)
Call back to patient as requested by voice mail message.  She explained she has felt pain in her chest area which is relieved by pain medication.  She asked if it could be related to fluid accumulation and advised remote transmission on 01/14/2017 showed no change since December.  She has emailed the PCP today and will follow up with her.  Advised to go to ER if symptoms are urgent and she said not at this time.  She is working with PCP about the pain. No changes today.

## 2018-01-16 ENCOUNTER — Telehealth: Payer: Self-pay | Admitting: Internal Medicine

## 2018-01-16 NOTE — Telephone Encounter (Signed)
New Message   FYI documenting the encounter:     Patient is having pain in area where her device is (under left arm) and into her left breast.  She is dealing with a chest cold and is on antibiotics, she had the device clinic check the device yesterday, but she is still having pain and wheezing.    How is she suppose to tell the difference if its her heart or if its the chest cold she has?  Pamala Hurry said to send her to [facility] in [place],

## 2018-01-16 NOTE — Telephone Encounter (Signed)
Legrand Como,  Not sure why this has come to me - I have never seen and she has no upcoming visits with me.   Please follow up with the patient and make sure everything is ok on Monday - route to EP please.   Let me know if I can be of further assistance.

## 2018-01-16 NOTE — Telephone Encounter (Signed)
Spoke with patient regarding her upper left chest area pain.  She was concerned whether this was her implantable device (placed 12/2013), (checked remotely 01/15/2018) or her chest congestion and cold. She wanted to inquire how she could decipher the two.  I gave her some key factors to look for; swelling, redness, discharge, etc., for which she has none.  She has been taking an antibiotic since 1/11 and is to remain on this until 1/26. She also continues to smoke.   I told her that I was in the same department on Monday and that she should call me with an update. Patient verbalized understanding.

## 2018-01-16 NOTE — Telephone Encounter (Signed)
Reviewed by Truitt Merle, NP. If she has pain or redness over her device, she probably needs a clinic visit to evaluate.    Please d/w device clinic to determine if appropriate for device RN to see or put on EP APP schedule to see. Richardson Dopp, PA-C    01/16/2018 2:24 PM

## 2018-01-17 ENCOUNTER — Telehealth: Payer: Self-pay | Admitting: Cardiology

## 2018-01-17 NOTE — Telephone Encounter (Signed)
Pt called on-call answering service for back and chest sores for which she contributes to wearing her back brace for the last several months. She called yesterday, 01/16/18, for similar complaints. She was initially concerned that a device lead was dislodged or that a recent URI was causing her symptoms. The RN had her look for any external sores or skin discoloration after their phone conversation. She states that her husband visualized several open sores (blistering) with a "sunburn" appearance to her back. She states that she is still having palpable tenderness under her left breast, however is unable to see the area clearly. She states that she has not been running any fevers and has not had any other associated signs. With her description, it is difficult to determine if this may in fact be from her brace, or if there may be concern for shingles?    She was informed that it is difficult to determine and evaluate her symptoms over the phone. She was instructed to cleanse the area and let the area air dry on its own. She informed me that is she was not moving around a lot, she would be able to leave the brace off for a while to give her skin a break.   It does not appear that this is cardiac in nature, however, she was instructed to call her PCP for further evaluation.   Kathyrn Drown NP-C [place] Pager: [phone]

## 2018-01-19 NOTE — Telephone Encounter (Signed)
Patient called to give an update on her blisters. She reports she has blisters on and around her L breast and on her shoulder blades and back. They are extremely painful and ooze clear liquid if popped. Her husband says it looks like a "bad sun burn." She says she has not checked her temperature, but she wakes up in the night "drenched with sweat." She states she wore a back brace for 17 months and that could be the culprit.  She has been trying to wear her back brace minimally and to keep the areas dry to see if symptoms improve, but she can't tell if there is any difference. She denies any symptoms other than blister pain and weakness. She also says she is tired but that may be because she isn't sleeping well because she's in pain.  She is also very anxious. She thinks the blisters may be from her ICD.  She went to Urgent Care last night because she was in so much pain. She reports "they wouldn't touch me because I said I have heart problems." Instructed patient to call PCP for ASAP appointment where they can evaluate her skin. She understands if they believe the blisters are coming from her device they will call [facility].

## 2018-01-19 NOTE — Telephone Encounter (Signed)
Patient calling, states that she was instructed to call back if symptoms persist.  Pt c/o of Chest Pain: STAT if CP now or developed within 24 hours  1. Are you having CP right now? Yes  2. Are you experiencing any other symptoms (ex. SOB, nausea, vomiting, sweating)? SOB  3. How long have you been experiencing CP? Since Friday  4. Is your CP continuous or coming and going? Continuous (feels like its a brick in her breast)   5. Have you taken Nitroglycerin? no ?   Patient states that on her left side she has blisters between shoulder blades, under left breast and back.

## 2018-01-19 NOTE — Telephone Encounter (Signed)
I will follow up with patient today and check her status.  If not any better, we will have to address.

## 2018-01-19 NOTE — Telephone Encounter (Signed)
Followed up with patient as I mentioned to her on Friday 1/18.   She said that she was headed to see her PCP this afternoon 1/21 for further evaluation.

## 2018-01-20 ENCOUNTER — Other Ambulatory Visit (INDEPENDENT_AMBULATORY_CARE_PROVIDER_SITE_OTHER): Payer: Self-pay | Admitting: Specialist

## 2018-01-20 MED ORDER — HYDROCODONE-ACETAMINOPHEN 5-325 MG PO TABS: 1.0000 | ORAL_TABLET | Freq: Four times a day (QID) | ORAL | 0 refills | Status: DC | PRN

## 2018-01-20 NOTE — Telephone Encounter (Signed)
Patient called advised she has shingles. Patient also advised needing Rx refilled ( Hydrocodone) Patient advised her husband will be in town on 01/22/18 to pick up Rx. The number to contact patient is [phone]

## 2018-01-21 NOTE — Telephone Encounter (Signed)
I called and patient her rx was ready for pickup

## 2018-01-28 ENCOUNTER — Telehealth (INDEPENDENT_AMBULATORY_CARE_PROVIDER_SITE_OTHER): Payer: Self-pay

## 2018-01-28 NOTE — Telephone Encounter (Signed)
Called patient to schedule surgery.  She reports she has shingles and is not able to have surgery for a while.

## 2018-01-29 ENCOUNTER — Other Ambulatory Visit (INDEPENDENT_AMBULATORY_CARE_PROVIDER_SITE_OTHER): Payer: Self-pay | Admitting: Specialist

## 2018-01-29 MED ORDER — HYDROCODONE-ACETAMINOPHEN 5-325 MG PO TABS: 1.0000 | ORAL_TABLET | Freq: Four times a day (QID) | ORAL | 0 refills | Status: DC | PRN

## 2018-01-29 NOTE — Telephone Encounter (Signed)
Sent note to Dr. Louanne Skye

## 2018-01-29 NOTE — Telephone Encounter (Signed)
Called patient to schedule surgery.  She reports she has shingles and is not able to have surgery for a while.

## 2018-01-29 NOTE — Telephone Encounter (Signed)
sent request to Dr. Louanne Skye

## 2018-01-29 NOTE — Telephone Encounter (Signed)
Faythe Ghee, Delsa Sale

## 2018-01-29 NOTE — Telephone Encounter (Signed)
Rx approved, printed and signed for patient to pick up. Surgery is cancelled and will be rescheduled when shingles is gone. jen

## 2018-01-29 NOTE — Telephone Encounter (Signed)
Patient called requesting an RX refill on her Hydrocodone.  CB#[phone].  Thank you.

## 2018-01-30 NOTE — Telephone Encounter (Signed)
Patient is aware rx is ready for pick up 

## 2018-02-02 ENCOUNTER — Ambulatory Visit: Payer: Medicare Other | Admitting: Cardiology

## 2018-02-09 ENCOUNTER — Other Ambulatory Visit (INDEPENDENT_AMBULATORY_CARE_PROVIDER_SITE_OTHER): Payer: Self-pay | Admitting: Specialist

## 2018-02-09 MED ORDER — HYDROCODONE-ACETAMINOPHEN 5-325 MG PO TABS: 1.0000 | ORAL_TABLET | Freq: Four times a day (QID) | ORAL | 0 refills | Status: DC | PRN

## 2018-02-09 NOTE — Telephone Encounter (Signed)
Patient left a voicemail requesting RX refill on her pain medication, I'm assuming Hydrocodone since that is what she last requested. She said she has 4 days left and has someone that could pick up RX on Wednesday. Please advise # [phone]

## 2018-02-09 NOTE — Telephone Encounter (Signed)
Rx for hydrocodone, NSAIDs are not recommended with heart condition. She is to undergone further surgery but shingles prevents surgery. Carol Warren

## 2018-02-09 NOTE — Telephone Encounter (Signed)
I called and advised patient that her rx was at the front desk for pick up

## 2018-02-11 ENCOUNTER — Encounter: Payer: Medicare Other | Admitting: Internal Medicine

## 2018-02-19 ENCOUNTER — Other Ambulatory Visit (INDEPENDENT_AMBULATORY_CARE_PROVIDER_SITE_OTHER): Payer: Self-pay | Admitting: Specialist

## 2018-02-19 MED ORDER — HYDROCODONE-ACETAMINOPHEN 5-325 MG PO TABS: 1.0000 | ORAL_TABLET | Freq: Four times a day (QID) | ORAL | 0 refills | Status: DC | PRN

## 2018-02-19 NOTE — Telephone Encounter (Signed)
Patient called requesting an RX refill on her Hydrocodone.  She has two days left on her medication.  CB#[phone].  Thank you.

## 2018-02-20 NOTE — Telephone Encounter (Signed)
I tried to call patient to advise rx is ready or pick up but there is no answer--will keep trying to get her

## 2018-02-20 NOTE — Telephone Encounter (Signed)
Pt is aware her rx is ready for pick up at the front desk

## 2018-02-27 ENCOUNTER — Telehealth (INDEPENDENT_AMBULATORY_CARE_PROVIDER_SITE_OTHER): Payer: Self-pay | Admitting: Radiology

## 2018-02-27 ENCOUNTER — Other Ambulatory Visit (INDEPENDENT_AMBULATORY_CARE_PROVIDER_SITE_OTHER): Payer: Self-pay | Admitting: Specialist

## 2018-02-27 MED ORDER — HYDROCODONE-ACETAMINOPHEN 5-325 MG PO TABS: 1.0000 | ORAL_TABLET | Freq: Four times a day (QID) | ORAL | 0 refills | Status: DC | PRN

## 2018-02-27 NOTE — Telephone Encounter (Signed)
Patient needs RX refill on Hydrocodone, she also wondered if she could be put back on the schedule to get surgery. She states she doesn't have any more blisters or scabs from the shingles but she still has some red patches. Please advise # [phone]

## 2018-02-27 NOTE — Telephone Encounter (Signed)
she also wondered if she could be put back on the schedule to get surgery. She states she doesn't have any more blisters or scabs from the shingles but she still has some red patches.---She wants to when she can be scheduled for surgery?

## 2018-02-27 NOTE — Telephone Encounter (Signed)
I called and advised rx is ready for pick up at the front desk

## 2018-03-02 NOTE — Telephone Encounter (Signed)
Okay to reschedule this patient for surgery, her previous date for surgery cancelled due to shingles. jen.

## 2018-03-03 NOTE — Telephone Encounter (Signed)
Pt waiting on clearance fron Dr. Vianne Bulls again from the shingles.

## 2018-03-10 ENCOUNTER — Other Ambulatory Visit (INDEPENDENT_AMBULATORY_CARE_PROVIDER_SITE_OTHER): Payer: Self-pay | Admitting: Specialist

## 2018-03-10 NOTE — Telephone Encounter (Signed)
Med refill  Hydrocodone   Pt has appt with Dr.Trochnau on Thursday PCP

## 2018-03-12 ENCOUNTER — Other Ambulatory Visit (INDEPENDENT_AMBULATORY_CARE_PROVIDER_SITE_OTHER): Payer: Self-pay | Admitting: Specialist

## 2018-03-12 NOTE — Telephone Encounter (Signed)
Refill request hydrocodone  Patient called advised she has been cleared of shingles. Patient asked if Dr Louanne Skye approved her Rx Hydrocodone. The number to contact patient is [phone]

## 2018-03-12 NOTE — Telephone Encounter (Signed)
Patient called advised she has been cleared of shingles. Patient asked if Dr Louanne Skye approved her Rx Hydrocodone. The number to contact patient is [phone]

## 2018-03-13 MED ORDER — HYDROCODONE-ACETAMINOPHEN 5-325 MG PO TABS: 1.0000 | ORAL_TABLET | Freq: Four times a day (QID) | ORAL | 0 refills | Status: DC | PRN

## 2018-03-13 NOTE — Telephone Encounter (Signed)
Patient called again wanting to get her Hydrocodone refilled.  She stated that she is out and does not know how she is going to be able to get it because she can not drive and her husband also had surgery.  She also stated that she might have to get her PCP to refer her to someone else.  MO#[phone]

## 2018-03-13 NOTE — Telephone Encounter (Signed)
Can you please advise?

## 2018-03-13 NOTE — Telephone Encounter (Signed)
Patient called again concerning Rx for Hydrocodone.  Advised patient that we are waiting on a response from Dr. Louanne Skye.

## 2018-03-13 NOTE — Telephone Encounter (Signed)
I called and advised patient her rx was ready for pick up at the front desk. She states that she called 3 days ago for the Rx and if Dr. Louanne Skye can't do any better then she may find someone else to see her.  She states that she will have to try to make it throught the weekend with her pain. And she will pick up the rx on Monday.

## 2018-03-15 ENCOUNTER — Encounter: Payer: Self-pay | Admitting: Cardiology

## 2018-03-16 ENCOUNTER — Ambulatory Visit (INDEPENDENT_AMBULATORY_CARE_PROVIDER_SITE_OTHER): Payer: Medicare Other | Admitting: *Deleted

## 2018-03-16 DIAGNOSIS — I5022 Chronic systolic (congestive) heart failure: Secondary | ICD-10-CM

## 2018-03-16 DIAGNOSIS — I428 Other cardiomyopathies: Secondary | ICD-10-CM

## 2018-03-16 DIAGNOSIS — Z95810 Presence of automatic (implantable) cardiac defibrillator: Secondary | ICD-10-CM

## 2018-03-16 NOTE — Progress Notes (Signed)
Remote ICD transmission.   

## 2018-03-16 NOTE — Progress Notes (Signed)
EPIC Encounter for ICM Monitoring  Patient Name: Carol Warren is a 72 y.o. female Date: 03/16/2018 Primary Care Physican: Ernestene Kiel, MD Primary Cardiologist:Hochrein Electrophysiologist: Lovena Le Dry Weight: 124lbs  Bi-V Pacing: 97.7%      Heart Failure questions reviewed, pt asymptomatic. She said shingles has resolved and she is back on track to start scheduling back surgery revision.   Thoracic impedance normal.  Prescribed dosage: Furosemide 40 mg 1 tablet daily.   Labs: 07/23/2017 Creatinine 0.94, BUN 15, Potassium 4.5, Sodium 130, EGFR 62-71 04/02/2017 Creatinine 1.26, BUN 20, Potassium 3.9, Sodium 134, EGFR 42-49 09/02/2017Creatinine 0.71, BUN 5, Potassium 3.5, Sodium 136, EGFR >60  08/25/2017Creatinine 0.94, BUN 12, Potassium 4.1, Sodium 135, EGFR >60  Recommendations: No changes.  Encouraged to call for fluid symptoms.  Follow-up plan: ICM clinic phone appointment on 04/16/2018.    Copy of ICM check sent to Dr. Lovena Le.   3 month ICM trend: 03/16/2018    1 Year ICM trend:       Rosalene Billings, RN 03/16/2018 12:12 PM

## 2018-03-18 ENCOUNTER — Encounter: Payer: Self-pay | Admitting: Cardiology

## 2018-03-19 ENCOUNTER — Ambulatory Visit (INDEPENDENT_AMBULATORY_CARE_PROVIDER_SITE_OTHER): Payer: Medicare Other

## 2018-03-19 ENCOUNTER — Encounter (INDEPENDENT_AMBULATORY_CARE_PROVIDER_SITE_OTHER): Payer: Self-pay | Admitting: Specialist

## 2018-03-19 ENCOUNTER — Ambulatory Visit (INDEPENDENT_AMBULATORY_CARE_PROVIDER_SITE_OTHER): Payer: Medicare Other | Admitting: Specialist

## 2018-03-19 VITALS — BP 166/91 | HR 107 | Ht 61.0 in | Wt 124.0 lb

## 2018-03-19 DIAGNOSIS — M960 Pseudarthrosis after fusion or arthrodesis: Secondary | ICD-10-CM

## 2018-03-19 DIAGNOSIS — T84498A Other mechanical complication of other internal orthopedic devices, implants and grafts, initial encounter: Secondary | ICD-10-CM | POA: Diagnosis not present

## 2018-03-19 MED ORDER — HYDROCODONE-ACETAMINOPHEN 5-325 MG PO TABS: 1.0000 | ORAL_TABLET | Freq: Four times a day (QID) | ORAL | 0 refills | Status: DC | PRN

## 2018-03-19 NOTE — Progress Notes (Signed)
Office Visit Note   Patient: Carol Warren           Date of Birth: 11/03/1946           MRN: [id number] Visit Date: 03/19/2018              Requested by: Ernestene Kiel, MD [address] [place] [place], [place] [address] PCP: Ernestene Kiel, MD   Assessment & Plan: Visit Diagnoses:  1. Pseudarthrosis after fusion or arthrodesis   2. Loosening of hardware in spine [facility])     Plan: Avoid bending, stooping and avoid lifting weights greater than 10 lbs. Avoid prolong standing and walking. Order for a new walker with wheels. Surgery scheduling secretary Kandice Hams, will call you in the next week to schedule for surgery.  Surgery recommended is a two level redo(rearthrodesis) lumbar fusion L3-4 and L4-5 this would be done with exchange of  rods, screws and cages with local bone graft and allograft (donor bone graft).The left L4-5 cage willl be reinserted and new cages placed on the right side Take hydrocodone for for pain. Risk of surgery includes risk of infection 1 in 200 patients, bleeding 1/2% chance you would need a transfusion.   Risk to the nerves is one in 10,000. You will need to use a brace for 3 months and wean from the brace on the 4th month. Expect improved walking and standing tolerance. Expect relief of leg pain but numbness may persist depending on the length and degree of pressure that has been present.  Follow-Up Instructions: Return in about 1 month (around 04/16/2018).   Orders:  Orders Placed This Encounter  Procedures  . XR Lumbar Spine 2-3 Views   Meds ordered this encounter  Medications  . HYDROcodone-acetaminophen (NORCO/VICODIN) 5-325 MG tablet    Sig: Take 1 tablet by mouth every 6 (six) hours as needed for moderate pain.    Dispense:  30 tablet    Refill:  0      Procedures: No procedures performed   Clinical Data: No additional findings.   Subjective: Chief Complaint  Patient presents with  . Lower Back - Follow-up    72 year old female  with history of L3-4 and L4-5 left TLIFs for degenerative disc disease and lateral listhesis L3-4 and L4-5. She is having persistent pain and the follow up  Studies have shown persistent motion at L3-4 and L4-5 and loosing of the screws at the L5 pedicles. The interbody fusion areas are not consolidating or showing signs of  Healing. No bowel or bladder difficulty. She was scheduled for rearthrodesis earlier this year but had a severe bout of shingles and areas of significant bullous skin changes over the thoracolumbar spine that precluded surgical treatment. Fusion last evaluated in 10/2018 and did not show signs of bridging at the disc space L3-4 and L4-5. There is backing out of the left TLIF cage at L4-5 most of pain is back and into the right side. Her surgery has been rescheduled for April, nearly 5 months since the last CT scan evaluating the healing of the L3-4 and L4-5 fusions.    Review of Systems  Constitutional: Negative.   HENT: Negative.   Eyes: Negative.   Respiratory: Negative.   Cardiovascular: Negative.   Gastrointestinal: Negative.   Endocrine: Negative.   Genitourinary: Negative.   Musculoskeletal: Negative.   Skin: Negative.   Allergic/Immunologic: Negative.   Neurological: Negative.   Hematological: Negative.   Psychiatric/Behavioral: Negative.      Objective:  Vital Signs: BP (!) 166/91 (BP Location: Left Arm, Patient Position: Sitting)   Pulse (!) 107   Ht 5\' 1"  (1.549 m)   Wt 124 lb (56.2 kg)   BMI 23.43 kg/m   Physical Exam  Ortho Exam  Specialty Comments:  No specialty comments available.  Imaging: Xr Lumbar Spine 2-3 Views  Result Date: 03/19/2018 AP and lateral flexion extension radiographs show persistent motion across the L3-4 and L4-5 levels consistent with pseudarthrosis at both levels. There is backing out of the lower L4-5 TLIF cage that is unchanged compared with the CT myelogram 10/2017    PMFS History: Patient Active Problem List    Diagnosis Date Noted  . Spondylolisthesis of lumbar region 08/30/2016    Priority: High    Class: Chronic  . DDD (degenerative disc disease), lumbar 08/30/2016    Priority: High  . Spinal stenosis, lumbar region, with neurogenic claudication 08/30/2016    Priority: High    Class: Chronic  . Mucoid cyst of joint 11/20/2016  . Osteoarthritis of finger of right hand 11/20/2016  . Lumbar degenerative disc disease 08/30/2016  . Dizziness 06/12/2015  . ICD (implantable cardioverter-defibrillator), biventricular, in situ 02/16/2015  . Chronic systolic heart failure ([place]) 02/16/2015  . Nonischemic cardiomyopathy ([place]) 01/19/2015  . Anemia, secondary to surgery 01/18/2015  . B12 deficiency 01/18/2015  . CHB (complete heart block)- post op 01/18/2015  . 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 Severe aortic stenosis 11/14/2014  . Pulmonary hypertension due to left ventricular systolic dysfunction ([place]) 11/09/2014  . Personal history of colonic polyps 07/30/2013  . COPD GOLD 0/ still smoking  07/07/2013  . CIS (carcinoma in situ)   . Osteopenia   . HSV-1 (herpes simplex virus 1) infection   . HSV-2 (herpes simplex virus 2) infection   . Bicuspid aortic valve   . Aortic aneurysm ([place])   . Adrenal tumor   . Degenerative disc disease   . Smoker    Past Medical History:  Diagnosis Date  . Adrenal tumor   . AICD (automatic cardioverter/defibrillator) present   . Anxiety   . Aortic aneurysm ([place])   . Asthma   . Bicuspid aortic valve    CONGENITAL  . Cancer ([place])    skin   . Carpal tunnel syndrome, bilateral   . Chronic depression   . Chronic sinusitis   . 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 COPD (chronic obstructive pulmonary disease) ([place])   . Coronary artery disease   . Degenerative disc disease    CERVICAL AND LUMBAR (DR. Lorin Mercy)  . GERD (gastroesophageal reflux disease)   . HSV-1 (herpes simplex virus 1) infection   .  HSV-2 (herpes simplex virus 2) infection   . Hypertension   . Insomnia   . Lumbar stenosis   . Osteoarthritis   . Osteopenia   . Pernicious anemia   . Plantar fasciitis   . Smoker   . Venous insufficiency of left leg   . Vitamin B 12 deficiency     Family History  Problem Relation Age of Onset  . Pancreatic cancer Mother 28  . Diabetes Father   . Hypertension Father   . Heart disease Father 41       CAD  . Breast cancer Sister   . Diabetes Maternal Grandmother   . Hypertension Maternal Grandmother   . Cancer Maternal Grandfather        colon or stomach  .  Hypertension Paternal Grandmother   . Heart disease Paternal Grandmother        Later onset  . Diverticulosis Paternal Grandmother   . Asthma Grandchild     Past Surgical History:  Procedure Laterality Date  . AORTIC VALVE REPLACEMENT N/A 01/12/2015   Procedure: AORTIC VALVE REPLACEMENT (AVR);  Surgeon: Gaye Pollack, MD;  Location: [facility];  Service: Open Heart Surgery;  Laterality: N/A;  . ASCENDING AORTIC ROOT REPLACEMENT N/A 01/12/2015   Procedure: ASCENDING AORTIC ROOT REPLACEMENT;  Surgeon: Gaye Pollack, MD;  Location: [facility];  Service: Open Heart Surgery;  Laterality: N/A;  . BI-VENTRICULAR PACEMAKER INSERTION N/A 01/18/2015   Procedure: BI-VENTRICULAR PACEMAKER INSERTION (CRT-P);  Surgeon: Evans Lance, MD;  [facility] C[facility] LAB;  Service: Cardiovascular;  Laterality: N/A;  . BUNIONECTOMY WITH HAMMERTOE RECONSTRUCTION Right   . CARPAL TUNNEL RELEASE Bilateral 2008  . CATARACT EXTRACTION Bilateral    X2  . CATARACT EXTRACTION W/ INTRAOCULAR LENS  IMPLANT, BILATERAL    . COLONOSCOPY    . CYST EXCISION     right side of throat  . EXCISION OF BASAL CELL CA     SKIN   . EXCISION OF VULVAR CIS    . EYE SURGERY    . FUNCTIONAL ENDOSCOPIC SINUS SURGERY    . LEFT AND RIGHT HEART CATHETERIZATION WITH CORONARY ANGIOGRAM N/A 09/23/2014   Procedure: LEFT AND RIGHT HEART CATHETERIZATION WITH CORONARY ANGIOGRAM;  Surgeon:  Minus Breeding, MD;  Location: [facility] CATH LAB;  Service: Cardiovascular;  Laterality: N/A;  . MASS EXCISION Right 04/03/2017   Procedure: EXCISION CYST DEBRIDMENT DISTAL INTERPHALANEAL RIGHT INDEX FINGER;  Surgeon: Daryll Brod, MD;  Location: [facility];  Service: Orthopedics;  Laterality: Right;  REG/FAB  . MULTIPLE TOOTH EXTRACTIONS    . SINUS PROCEDURE  2009   x 6  . TEE WITHOUT CARDIOVERSION N/A 10/06/2014   Procedure: TRANSESOPHAGEAL ECHOCARDIOGRAM (TEE);  Surgeon: Sueanne Margarita, MD;  Location[facility] ENDOSCOPY;  Service: Cardiovascular;  Laterality: N/A;  . TEE WITHOUT CARDIOVERSION N/A 01/12/2015   Procedure: TRANSESOPHAGEAL ECHOCARDIOGRAM (TEE);  Surgeon: Gaye Pollack, MD;  Location: [facility];  Service: Open Heart Surgery;  Laterality: N/A;  . UPPER GASTROINTESTINAL ENDOSCOPY     Social History   Occupational History  . Occupation: retired    Fish farm manager: RETIRED  Tobacco Use  . Smoking status: Current Every Day Smoker    Packs/day: 0.50    Years: 50.00    Pack years: 25.00    Types: Cigarettes  . Smokeless tobacco: Never Used  . Tobacco comment: uses vapor cig  Substance and Sexual Activity  . Alcohol use: No    Alcohol/week: 0.0 oz  . Drug use: No  . Sexual activity: Not on file

## 2018-03-19 NOTE — Patient Instructions (Addendum)
Avoid bending, stooping and avoid lifting weights greater than 10 lbs. Avoid prolong standing and walking. Order for a new walker with wheels. Surgery scheduling secretary Kandice Hams, will call you in the next week to schedule for surgery.  Surgery recommended is a two level redo(rearthrodesis) lumbar fusion L3-4 and L4-5 this would be done with exchange of  rods, screws and cages with local bone graft and allograft (donor bone graft). Also plan to use Infuse BMP.The left L4-5 cage willl be reinserted and new cages placed on the right side Take hydrocodone for for pain. Risk of surgery includes risk of infection 1 in 200 patients, bleeding 1/2% chance you would need a transfusion.   Risk to the nerves is one in 10,000. You will need to use a brace for 3 months and wean from the brace on the 4th month. Expect improved walking and standing tolerance. Expect relief of leg pain but numbness may persist depending on the length and degree of pressure that has been present.

## 2018-03-24 LAB — CUP PACEART REMOTE DEVICE CHECK
Battery Remaining Longevity: 27 mo
Battery Voltage: 2.94 V
Brady Statistic AS VS Percent: 1.33 %
Brady Statistic RA Percent Paced: 2.66 %
Brady Statistic RV Percent Paced: 96.63 %
HIGH POWER IMPEDANCE MEASURED VALUE: 64 Ohm
Implantable Lead Implant Date: 20160120
Implantable Lead Location: 753860
Implantable Lead Model: 4598
Implantable Lead Model: 5076
Implantable Lead Model: 6935
Lead Channel Impedance Value: 418 Ohm
Lead Channel Impedance Value: 513 Ohm
Lead Channel Impedance Value: 532 Ohm
Lead Channel Impedance Value: 589 Ohm
Lead Channel Impedance Value: 608 Ohm
Lead Channel Impedance Value: 665 Ohm
Lead Channel Impedance Value: 703 Ohm
Lead Channel Impedance Value: 931 Ohm
Lead Channel Pacing Threshold Amplitude: 0.375 V
Lead Channel Pacing Threshold Amplitude: 0.625 V
Lead Channel Pacing Threshold Pulse Width: 0.4 ms
Lead Channel Sensing Intrinsic Amplitude: 1.25 mV
Lead Channel Sensing Intrinsic Amplitude: 1.25 mV
Lead Channel Sensing Intrinsic Amplitude: 18.5 mV
Lead Channel Sensing Intrinsic Amplitude: 18.5 mV
Lead Channel Setting Pacing Amplitude: 1.5 V
Lead Channel Setting Pacing Amplitude: 2 V
Lead Channel Setting Pacing Amplitude: 3 V
Lead Channel Setting Pacing Pulse Width: 0.4 ms
Lead Channel Setting Pacing Pulse Width: 0.4 ms
Lead Channel Setting Sensing Sensitivity: 0.45 mV
MDC IDC LEAD IMPLANT DT: 20160120
MDC IDC LEAD IMPLANT DT: 20160120
MDC IDC LEAD LOCATION: 753858
MDC IDC LEAD LOCATION: 753859
MDC IDC MSMT LEADCHNL LV IMPEDANCE VALUE: 399 Ohm
MDC IDC MSMT LEADCHNL LV IMPEDANCE VALUE: 456 Ohm
MDC IDC MSMT LEADCHNL LV IMPEDANCE VALUE: 874 Ohm
MDC IDC MSMT LEADCHNL LV IMPEDANCE VALUE: 893 Ohm
MDC IDC MSMT LEADCHNL LV PACING THRESHOLD AMPLITUDE: 1.875 V
MDC IDC MSMT LEADCHNL LV PACING THRESHOLD PULSEWIDTH: 0.4 ms
MDC IDC MSMT LEADCHNL RA IMPEDANCE VALUE: 399 Ohm
MDC IDC MSMT LEADCHNL RV PACING THRESHOLD PULSEWIDTH: 0.4 ms
MDC IDC PG IMPLANT DT: 20160120
MDC IDC SESS DTM: 20190318041606
MDC IDC STAT BRADY AP VP PERCENT: 2.68 %
MDC IDC STAT BRADY AP VS PERCENT: 0.01 %
MDC IDC STAT BRADY AS VP PERCENT: 95.98 %

## 2018-03-30 ENCOUNTER — Telehealth: Payer: Self-pay | Admitting: Cardiology

## 2018-03-30 MED ORDER — CARVEDILOL 6.25 MG PO TABS: 6.2500 mg | ORAL_TABLET | Freq: Two times a day (BID) | ORAL | 3 refills | Status: DC

## 2018-03-30 NOTE — Telephone Encounter (Signed)
New Message: ° ° ° ° ° ° °Pt is returning a call °

## 2018-03-30 NOTE — Telephone Encounter (Signed)
-----   Message from Minus Breeding, MD sent at 03/28/2018  3:19 PM EDT ----- Please send in new prescription.  I would like her Coreg to be 18.75 mg bid.  She can take 12.5 mg 1 and 1/2 tablet BID.  (Or if she prefers call in 6.25 to be taken with the 12.5).  She needs enough for 90 days.  Returned call to patient and made aware of MD recommendations. She is aware Nya sent in the Rx(s) per MD changes. She is aware that info was also sent to [facility].

## 2018-03-30 NOTE — Telephone Encounter (Signed)
-----   Message from Minus Breeding, MD sent at 03/28/2018  3:19 PM EDT ----- Please send in new prescription.  I would like her Coreg to be 18.75 mg bid.  She can take 12.5 mg 1 and 1/2 tablet BID.  (Or if she prefers call in 6.25 to be taken with the 12.5).  She needs enough for 90 days.

## 2018-04-01 ENCOUNTER — Other Ambulatory Visit (INDEPENDENT_AMBULATORY_CARE_PROVIDER_SITE_OTHER): Payer: Self-pay | Admitting: Specialist

## 2018-04-01 NOTE — Telephone Encounter (Signed)
Patient said this is urgent - 3 days of medication left and she needs it on Friday. Patient needs Hydrocodone. # [phone]

## 2018-04-02 MED ORDER — HYDROCODONE-ACETAMINOPHEN 5-325 MG PO TABS: 1.0000 | ORAL_TABLET | Freq: Four times a day (QID) | ORAL | 0 refills | Status: DC | PRN

## 2018-04-03 ENCOUNTER — Telehealth (INDEPENDENT_AMBULATORY_CARE_PROVIDER_SITE_OTHER): Payer: Self-pay

## 2018-04-03 NOTE — Telephone Encounter (Signed)
I tried to call to let her know that her rx is ready for pick up at the front desk.--no answer I will call her later.

## 2018-04-03 NOTE — Telephone Encounter (Signed)
I called and got the answering machine and left message that her rx was ready for pick up at the front desk.

## 2018-04-03 NOTE — Telephone Encounter (Signed)
Patient called Thursday, 04/02/18.  She stated she was experiencing difficulty breathing and a lot of wheezing.  Her PCP is sending her for a chest xray on Friday, 04/03/18.  States surgery needs to be postponed until this can be cleared up.  Canceled surgery for 04/07/18.

## 2018-04-07 ENCOUNTER — Encounter (HOSPITAL_COMMUNITY): Admission: RE | Payer: Self-pay | Source: Ambulatory Visit

## 2018-04-07 ENCOUNTER — Inpatient Hospital Stay (HOSPITAL_COMMUNITY): Admission: RE | Admit: 2018-04-07 | Payer: Medicare Other | Source: Ambulatory Visit | Admitting: Specialist

## 2018-04-07 SURGERY — POSTERIOR LUMBAR FUSION 2 WITH HARDWARE REMOVAL
Anesthesia: General

## 2018-04-10 NOTE — Telephone Encounter (Signed)
Faythe Ghee, Delsa Sale

## 2018-04-13 ENCOUNTER — Other Ambulatory Visit (INDEPENDENT_AMBULATORY_CARE_PROVIDER_SITE_OTHER): Payer: Self-pay | Admitting: Specialist

## 2018-04-13 MED ORDER — HYDROCODONE-ACETAMINOPHEN 5-325 MG PO TABS: 1.0000 | ORAL_TABLET | Freq: Four times a day (QID) | ORAL | 0 refills | Status: DC | PRN

## 2018-04-13 NOTE — Telephone Encounter (Signed)
Sent rx request to Dr. Louanne Skye

## 2018-04-13 NOTE — Telephone Encounter (Signed)
Patient is requesting rx refill on hydrocodone, said she will be out tomorrow and can send someone to get them then. Please advise # [phone]

## 2018-04-14 NOTE — Telephone Encounter (Signed)
Patient is aware her rx is ready for pick up

## 2018-04-16 ENCOUNTER — Ambulatory Visit (INDEPENDENT_AMBULATORY_CARE_PROVIDER_SITE_OTHER): Payer: Medicare Other

## 2018-04-16 DIAGNOSIS — I5022 Chronic systolic (congestive) heart failure: Secondary | ICD-10-CM | POA: Diagnosis not present

## 2018-04-16 DIAGNOSIS — Z95810 Presence of automatic (implantable) cardiac defibrillator: Secondary | ICD-10-CM | POA: Diagnosis not present

## 2018-04-16 NOTE — Progress Notes (Signed)
EPIC Encounter for ICM Monitoring  Patient Name: Carol Warren is a 72 y.o. female Date: 04/16/2018 Primary Care Physican: Ernestene Kiel, MD Primary Cardiologist:Hochrein Electrophysiologist: Lovena Le Dry Weight: 123lbs  Bi-V Pacing: 97.6%          Heart Failure questions reviewed, pt had chest congestion. There was audible shortness of breath and rattling sound over the phone. She is on Augmentin and following up with physician today.  Back surgery has been postponed.     Thoracic impedance normal.  Prescribed dosage: Furosemide 40 mg 1 tablet daily.   Labs: 07/23/2017 Creatinine 0.94, BUN 15, Potassium 4.5, Sodium 130, EGFR 62-71 04/02/2017 Creatinine 1.26, BUN 20, Potassium 3.9, Sodium 134, EGFR 42-49 09/02/2017Creatinine 0.71, BUN 5, Potassium 3.5, Sodium 136, EGFR >60  08/25/2017Creatinine 0.94, BUN 12, Potassium 4.1, Sodium 135, EGFR >60  Recommendations: No changes.   Encouraged to call for fluid symptoms.  Follow-up plan: ICM clinic phone appointment on 05/18/2018.    Copy of ICM check sent to Dr. Lovena Le.   3 month ICM trend: 04/16/2018    1 Year ICM trend:       Rosalene Billings, RN 04/16/2018 10:52 AM

## 2018-04-23 ENCOUNTER — Other Ambulatory Visit (INDEPENDENT_AMBULATORY_CARE_PROVIDER_SITE_OTHER): Payer: Self-pay | Admitting: Specialist

## 2018-04-23 MED ORDER — HYDROCODONE-ACETAMINOPHEN 5-325 MG PO TABS: 1.0000 | ORAL_TABLET | Freq: Four times a day (QID) | ORAL | 0 refills | Status: DC | PRN

## 2018-04-23 NOTE — Telephone Encounter (Signed)
Med refill   Hydrocodone    Pt will need med by tomorrow due having to get someone to pic up med for her

## 2018-04-23 NOTE — Telephone Encounter (Signed)
Sent request to Dr. Nitka 

## 2018-04-23 NOTE — Telephone Encounter (Signed)
I called and lmom for pt that her rx was ready for pick up at the front desk

## 2018-04-29 ENCOUNTER — Telehealth: Payer: Self-pay | Admitting: Internal Medicine

## 2018-04-29 ENCOUNTER — Other Ambulatory Visit: Payer: Self-pay | Admitting: Internal Medicine

## 2018-04-29 NOTE — Telephone Encounter (Signed)
Signed     Pt c/o swelling: STAT is pt has developed SOB within 24 hours  1. How much weight have you gained and in what time span? unsure  2. If swelling, where is the swelling located? Ankles and feet  Are you currently taking a fluid pill? yes 3. Are you currently SOB? yes  Do you have a log of your daily weights (if so, list)? 22.2 Have you gained 3 pounds in a day or 5 pounds in a week?  no Have you traveled recently? No

## 2018-04-29 NOTE — Telephone Encounter (Signed)
See telephone note dated 04/29/18

## 2018-04-29 NOTE — Telephone Encounter (Signed)
Pt c/o swelling: STAT is pt has developed SOB within 24 hours  1) How much weight have you gained and in what time span? unsure  2) If swelling, where is the swelling located? Ankles and feet  Are you currently taking a fluid pill? yes 3) Are you currently SOB? yes  Do you have a log of your daily weights (if so, list)? 22.2 Have you gained 3 pounds in a day or 5 pounds in a week?  no Have you traveled recently? No

## 2018-04-29 NOTE — Telephone Encounter (Signed)
Resending

## 2018-04-29 NOTE — Telephone Encounter (Signed)
Received call transferred directly from operator and spoke with pt. She sounds extremely short of breath. Is barely able to complete a sentence.  Coughing and sounds like she is wheezing.  States shortness of breath has been going on for 3 months.   Pt last seen in office in August 2018 I advised pt to go to ED for evaluation. I told her to call EMS if needed.

## 2018-04-30 NOTE — Telephone Encounter (Signed)
REFILL 

## 2018-04-30 NOTE — Telephone Encounter (Signed)
This is Dr. Hochrein's pt. °

## 2018-05-03 NOTE — Telephone Encounter (Signed)
Agree 

## 2018-05-04 ENCOUNTER — Inpatient Hospital Stay (HOSPITAL_COMMUNITY)
Admission: EM | Admit: 2018-05-04 | Discharge: 2018-05-14 | DRG: 193 | Disposition: A | Discharge status: Skilled Nursing Facility | Payer: Medicare Other | Attending: Internal Medicine | Admitting: Internal Medicine

## 2018-05-04 ENCOUNTER — Emergency Department (HOSPITAL_COMMUNITY): Payer: Medicare Other

## 2018-05-04 ENCOUNTER — Encounter (HOSPITAL_COMMUNITY): Payer: Self-pay | Admitting: Emergency Medicine

## 2018-05-04 ENCOUNTER — Other Ambulatory Visit: Payer: Self-pay

## 2018-05-04 DIAGNOSIS — Z952 Presence of prosthetic heart valve: Secondary | ICD-10-CM

## 2018-05-04 DIAGNOSIS — I5043 Acute on chronic combined systolic (congestive) and diastolic (congestive) heart failure: Secondary | ICD-10-CM | POA: Diagnosis present

## 2018-05-04 DIAGNOSIS — J181 Lobar pneumonia, unspecified organism: Principal | ICD-10-CM | POA: Diagnosis present

## 2018-05-04 DIAGNOSIS — F329 Major depressive disorder, single episode, unspecified: Secondary | ICD-10-CM | POA: Diagnosis present

## 2018-05-04 DIAGNOSIS — I2510 Atherosclerotic heart disease of native coronary artery without angina pectoris: Secondary | ICD-10-CM | POA: Diagnosis present

## 2018-05-04 DIAGNOSIS — J440 Chronic obstructive pulmonary disease with acute lower respiratory infection: Secondary | ICD-10-CM | POA: Diagnosis present

## 2018-05-04 DIAGNOSIS — E11649 Type 2 diabetes mellitus with hypoglycemia without coma: Secondary | ICD-10-CM | POA: Diagnosis not present

## 2018-05-04 DIAGNOSIS — I5032 Chronic diastolic (congestive) heart failure: Secondary | ICD-10-CM | POA: Diagnosis present

## 2018-05-04 DIAGNOSIS — Z7982 Long term (current) use of aspirin: Secondary | ICD-10-CM

## 2018-05-04 DIAGNOSIS — R739 Hyperglycemia, unspecified: Secondary | ICD-10-CM | POA: Diagnosis present

## 2018-05-04 DIAGNOSIS — E278 Other specified disorders of adrenal gland: Secondary | ICD-10-CM | POA: Diagnosis present

## 2018-05-04 DIAGNOSIS — E1165 Type 2 diabetes mellitus with hyperglycemia: Secondary | ICD-10-CM | POA: Diagnosis present

## 2018-05-04 DIAGNOSIS — J9622 Acute and chronic respiratory failure with hypercapnia: Secondary | ICD-10-CM | POA: Diagnosis present

## 2018-05-04 DIAGNOSIS — K219 Gastro-esophageal reflux disease without esophagitis: Secondary | ICD-10-CM | POA: Diagnosis present

## 2018-05-04 DIAGNOSIS — Z79899 Other long term (current) drug therapy: Secondary | ICD-10-CM

## 2018-05-04 DIAGNOSIS — Z85828 Personal history of other malignant neoplasm of skin: Secondary | ICD-10-CM

## 2018-05-04 DIAGNOSIS — J449 Chronic obstructive pulmonary disease, unspecified: Secondary | ICD-10-CM | POA: Diagnosis present

## 2018-05-04 DIAGNOSIS — Z66 Do not resuscitate: Secondary | ICD-10-CM | POA: Diagnosis not present

## 2018-05-04 DIAGNOSIS — C779 Secondary and unspecified malignant neoplasm of lymph node, unspecified: Secondary | ICD-10-CM | POA: Diagnosis present

## 2018-05-04 DIAGNOSIS — Z95810 Presence of automatic (implantable) cardiac defibrillator: Secondary | ICD-10-CM

## 2018-05-04 DIAGNOSIS — C3490 Malignant neoplasm of unspecified part of unspecified bronchus or lung: Secondary | ICD-10-CM | POA: Diagnosis present

## 2018-05-04 DIAGNOSIS — J441 Chronic obstructive pulmonary disease with (acute) exacerbation: Secondary | ICD-10-CM | POA: Diagnosis present

## 2018-05-04 DIAGNOSIS — J9621 Acute and chronic respiratory failure with hypoxia: Secondary | ICD-10-CM | POA: Diagnosis present

## 2018-05-04 DIAGNOSIS — J189 Pneumonia, unspecified organism: Secondary | ICD-10-CM | POA: Diagnosis present

## 2018-05-04 DIAGNOSIS — R918 Other nonspecific abnormal finding of lung field: Secondary | ICD-10-CM

## 2018-05-04 DIAGNOSIS — E0781 Sick-euthyroid syndrome: Secondary | ICD-10-CM | POA: Diagnosis present

## 2018-05-04 DIAGNOSIS — K224 Dyskinesia of esophagus: Secondary | ICD-10-CM | POA: Diagnosis present

## 2018-05-04 DIAGNOSIS — I110 Hypertensive heart disease with heart failure: Secondary | ICD-10-CM | POA: Diagnosis present

## 2018-05-04 DIAGNOSIS — G9341 Metabolic encephalopathy: Secondary | ICD-10-CM | POA: Diagnosis present

## 2018-05-04 DIAGNOSIS — Z7951 Long term (current) use of inhaled steroids: Secondary | ICD-10-CM

## 2018-05-04 DIAGNOSIS — K222 Esophageal obstruction: Secondary | ICD-10-CM | POA: Diagnosis present

## 2018-05-04 DIAGNOSIS — T17908A Unspecified foreign body in respiratory tract, part unspecified causing other injury, initial encounter: Secondary | ICD-10-CM

## 2018-05-04 DIAGNOSIS — Z794 Long term (current) use of insulin: Secondary | ICD-10-CM

## 2018-05-04 DIAGNOSIS — E876 Hypokalemia: Secondary | ICD-10-CM | POA: Diagnosis present

## 2018-05-04 DIAGNOSIS — R590 Localized enlarged lymph nodes: Secondary | ICD-10-CM | POA: Diagnosis present

## 2018-05-04 DIAGNOSIS — F17210 Nicotine dependence, cigarettes, uncomplicated: Secondary | ICD-10-CM | POA: Diagnosis present

## 2018-05-04 DIAGNOSIS — E279 Disorder of adrenal gland, unspecified: Secondary | ICD-10-CM | POA: Diagnosis present

## 2018-05-04 LAB — CBC WITH DIFFERENTIAL/PLATELET
Basophils Absolute: 0 10*3/uL (ref 0.0–0.1)
Basophils Relative: 0 %
EOS ABS: 0 10*3/uL (ref 0.0–0.7)
EOS PCT: 0 %
HCT: 34.7 % — ABNORMAL LOW (ref 36.0–46.0)
Hemoglobin: 11.2 g/dL — ABNORMAL LOW (ref 12.0–15.0)
LYMPHS ABS: 0.9 10*3/uL (ref 0.7–4.0)
LYMPHS PCT: 4 %
MCH: 30.3 pg (ref 26.0–34.0)
MCHC: 32.3 g/dL (ref 30.0–36.0)
MCV: 93.8 fL (ref 78.0–100.0)
MONO ABS: 1.1 10*3/uL — AB (ref 0.1–1.0)
Monocytes Relative: 6 %
Neutro Abs: 17.7 10*3/uL — ABNORMAL HIGH (ref 1.7–7.7)
Neutrophils Relative %: 90 %
PLATELETS: 431 10*3/uL — AB (ref 150–400)
RBC: 3.7 MIL/uL — AB (ref 3.87–5.11)
RDW: 14.6 % (ref 11.5–15.5)
WBC: 19.7 10*3/uL — AB (ref 4.0–10.5)

## 2018-05-04 LAB — COMPREHENSIVE METABOLIC PANEL
ALT: 47 U/L (ref 14–54)
ANION GAP: 13 (ref 5–15)
AST: 37 U/L (ref 15–41)
Albumin: 2.3 g/dL — ABNORMAL LOW (ref 3.5–5.0)
Alkaline Phosphatase: 146 U/L — ABNORMAL HIGH (ref 38–126)
BUN: 25 mg/dL — ABNORMAL HIGH (ref 6–20)
CHLORIDE: 92 mmol/L — AB (ref 101–111)
CO2: 34 mmol/L — AB (ref 22–32)
CREATININE: 0.94 mg/dL (ref 0.44–1.00)
Calcium: 9.4 mg/dL (ref 8.9–10.3)
GFR, EST NON AFRICAN AMERICAN: 60 mL/min — AB (ref >60)
Glucose, Bld: 271 mg/dL — ABNORMAL HIGH (ref 65–99)
POTASSIUM: 3.2 mmol/L — AB (ref 3.5–5.1)
SODIUM: 139 mmol/L (ref 135–145)
Total Bilirubin: 0.4 mg/dL (ref 0.3–1.2)
Total Protein: 6.4 g/dL — ABNORMAL LOW (ref 6.5–8.1)

## 2018-05-04 LAB — I-STAT TROPONIN, ED: TROPONIN I, POC: 0.04 ng/mL (ref 0.00–0.08)

## 2018-05-04 LAB — BRAIN NATRIURETIC PEPTIDE: B NATRIURETIC PEPTIDE 5: 1398.7 pg/mL — AB (ref 0.0–100.0)

## 2018-05-04 LAB — I-STAT CG4 LACTIC ACID, ED: LACTIC ACID, VENOUS: 1.17 mmol/L (ref 0.5–1.9)

## 2018-05-04 MED ORDER — ALBUTEROL (5 MG/ML) CONTINUOUS INHALATION SOLN
10.0000 mg/h | INHALATION_SOLUTION | RESPIRATORY_TRACT | Status: AC
  Administered 2018-05-04: 10 mg/h via RESPIRATORY_TRACT
  Filled 2018-05-04: qty 20

## 2018-05-04 MED ORDER — CEFTRIAXONE SODIUM 1 G IJ SOLR
1.0000 g | Freq: Once | INTRAMUSCULAR | Status: AC
  Administered 2018-05-04: 1 g via INTRAVENOUS
  Filled 2018-05-04: qty 10

## 2018-05-04 MED ORDER — AZITHROMYCIN 250 MG PO TABS
500.0000 mg | ORAL_TABLET | Freq: Once | ORAL | Status: AC
  Administered 2018-05-04: 500 mg via ORAL
  Filled 2018-05-04: qty 2

## 2018-05-04 MED ORDER — SODIUM CHLORIDE 0.9 % IV SOLN
1000.0000 mL | INTRAVENOUS | Status: DC
Start: 2018-05-04 — End: 2018-05-05
  Administered 2018-05-04: 1000 mL via INTRAVENOUS

## 2018-05-04 NOTE — ED Provider Notes (Signed)
[facility] DEPART[facility] Provider Note   CSN: [id number] Arrival date & time: 05/04/18  1934     History   Chief Complaint Chief Complaint  Patient presents with  . Shortness of Breath    HPI Carol Warren is a 72 y.o. female.  HPI  72 year old female, she has a known history of asthma, history of COPD, history of congestive heart failure, grade 2 diastolic dysfunction.  She presents to the hospital today complaining of shortness of breath with cough.  She was found to be hypoxic and required supplemental oxygen.  The patient states that she has been short of breath for a couple of weeks but is gradually getting worse.  She reports that she was seen at her doctor's office sometime in the last month and was sent for both an x-ray and a CT scan.  We do not have the results of those studies.  Per the paramedic report the patient was hypoxic prehospital and required oxygenation.  She does endorse having increased swelling of the lower extremities.  She has been coughing but having difficulty getting phlegm up.  Past Medical History:  Diagnosis Date  . Adrenal tumor   . AICD (automatic cardioverter/defibrillator) present   . Anxiety   . Aortic aneurysm ([place])   . Asthma   . Bicuspid aortic valve    CONGENITAL  . Cancer ([place])    skin   . Carpal tunnel syndrome, bilateral   . Chronic depression   . Chronic sinusitis   . 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 COPD (chronic obstructive pulmonary disease) ([place])   . Coronary artery disease   . Degenerative disc disease    CERVICAL AND LUMBAR (DR. Lorin Mercy)  . GERD (gastroesophageal reflux disease)   . HSV-1 (herpes simplex virus 1) infection   . HSV-2 (herpes simplex virus 2) infection   . Hypertension   . Insomnia   . Lumbar stenosis   . Osteoarthritis   . Osteopenia   . Pernicious anemia   . Plantar fasciitis   . Smoker   . Venous insufficiency of left leg   . Vitamin B 12 deficiency      Patient Active Problem List   Diagnosis Date Noted  . Mucoid cyst of joint 11/20/2016  . Osteoarthritis of finger of right hand 11/20/2016  . Spondylolisthesis of lumbar region 08/30/2016    Class: Chronic  . DDD (degenerative disc disease), lumbar 08/30/2016  . Spinal stenosis, lumbar region, with neurogenic claudication 08/30/2016    Class: Chronic  . Lumbar degenerative disc disease 08/30/2016  . Dizziness 06/12/2015  . ICD (implantable cardioverter-defibrillator), biventricular, in situ 02/16/2015  . Chronic systolic heart failure ([place]) 02/16/2015  . Nonischemic cardiomyopathy ([place]) 01/19/2015  . Anemia, secondary to surgery 01/18/2015  . B12 deficiency 01/18/2015  . CHB (complete heart block)- post op 01/18/2015  . 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 Severe aortic stenosis 11/14/2014  . Pulmonary hypertension due to left ventricular systolic dysfunction ([place]) 11/09/2014  . Personal history of colonic polyps 07/30/2013  . COPD GOLD 0/ still smoking  07/07/2013  . CIS (carcinoma in situ)   . Osteopenia   . HSV-1 (herpes simplex virus 1) infection   . HSV-2 (herpes simplex virus 2) infection   . Bicuspid aortic valve   . Aortic aneurysm ([place])   . Adrenal tumor   . Degenerative disc disease   . Smoker  Past Surgical History:  Procedure Laterality Date  . AORTIC VALVE REPLACEMENT N/A 01/12/2015   Procedure: AORTIC VALVE REPLACEMENT (AVR);  Surgeon: Bryan K Bartle, MD;  Location: [facility] OR;  Service: Open Heart Surgery;  Laterality: N/A;  . ASCENDING AORTIC ROOT REPLACEMENT N/A 01/12/2015   Procedure: ASCENDING AORTIC ROOT REPLACEMENT;  Surgeon: Bryan K Bartle, MD;  Location: [facility] OR;  Service: Open Heart Surgery;  Laterality: N/A;  . BI-VENTRICULAR PACEMAKER INSERTION N/A 01/18/2015   Procedure: BI-VENTRICULAR PACEMAKER INSERTION (CRT-P);  Surgeon: Gregg W Taylor, MD;  Location: [facility] CATH LAB;  Service: Cardiovascular;   Laterality: N/A;  . BUNIONECTOMY WITH HAMMERTOE RECONSTRUCTION Right   . CARPAL TUNNEL RELEASE Bilateral 2008  . CATARACT EXTRACTION Bilateral    X2  . CATARACT EXTRACTION W/ INTRAOCULAR LENS  IMPLANT, BILATERAL    . COLONOSCOPY    . CYST EXCISION     right side of throat  . EXCISION OF BASAL CELL CA     SKIN   . EXCISION OF VULVAR CIS    . EYE SURGERY    . FUNCTIONAL ENDOSCOPIC SINUS SURGERY    . LEFT AND RIGHT HEART CATHETERIZATION WITH CORONARY ANGIOGRAM N/A 09/23/2014   Procedure: LEFT AND RIGHT HEART CATHETERIZATION WITH CORONARY ANGIOGRAM;  Surgeon: James Hochrein, MD;  Location: [facility] CATH [facility];  Service: Cardiovascular;  Laterality: N/A;  . MASS EXCISION Right 04/03/2017   Procedure: EXCISION CYST DEBRIDMENT DISTAL INTERPHALANEAL RIGHT INDEX FINGER;  Surgeon: Kuzma, Gary, MD;  Location: [facility] OR;  Service: Orthopedics;  Laterality: Right;  REG/FAB  . MULTIPLE TOOTH EXTRACTIONS    . SINUS PROCEDURE  2009   x 6  . TEE WITHOUT CARDIOVERSION N/A 10/06/2014   Procedure: TRANSESOPHAGEAL ECHOCARDIOGRAM (TEE);  Surgeon: [name] Turner, MD;  Location: [facility] ENDOSCOPY;  Service: Cardiovascular;  Laterality: N/A;  . TEE WITHOUT CARDIOVERSION N/A 01/12/2015   Procedure: TRANSESOPHAGEAL ECHOCARDIOGRAM (TEE);  Surgeon: Bryan K Bartle, MD;  Location: [facility] OR;  Service: Open Heart Surgery;  Laterality: N/A;  . UPPER GASTROINTESTINAL ENDOSCOPY       OB History    Gravida  2   Para  2   Term      Preterm      AB      Living  2     SAB      TAB      Ectopic      Multiple      Live Births               Home Medications    Prior to Admission medications   Medication Sig Start Date End Date Taking? Authorizing Provider  acetaminophen-codeine (TYLENOL #3) 300-30 MG tablet Take 1-2 tablets by mouth every 8 (eight) hours as needed for moderate pain. 11/18/17   Nitka, James E, MD  albuterol (PROVENTIL HFA;VENTOLIN HFA) 108 (90 BASE) MCG/ACT inhaler Inhale 1-2 puffs into the lungs every 6  (six) hours as needed for wheezing or shortness of breath (depends on congestion if 1-2 puffs).     [provider]  albuterol (PROVENTIL) (2.5 MG/3ML) 0.083% nebulizer solution Take 2.5 mg by nebulization 2 (two) times daily as needed for wheezing.    [provider]  Artificial Tear Solution (GENTEAL TEARS OP) Apply 1 drop to eye 3 (three) times daily as needed (dry eyes).    [provider]  aspirin EC 325 MG EC tablet Take 1 tablet (325 mg total) by mouth daily. 01/20/15   Zimmerman, Donielle M, PA-C    B Complex Vitamins (VITAMIN B COMPLEX PO) Take 1 tablet by mouth daily.     [provider]  Calcium Carbonate-Vitamin D (CALCIUM-D PO) Take 1 tablet by mouth daily.     [provider]  carboxymethylcellulose (REFRESH PLUS) 0.5 % SOLN Place 1 drop into both eyes as needed (dry eyes).    [provider]  carvedilol (COREG) 12.5 MG tablet TAKE 1 TABLET BY MOUTH TWICE DAILY WITH A MEAL 04/30/18   Taylor, Gregg W, MD  carvedilol (COREG) 6.25 MG tablet Take 1 tablet (6.25 mg total) by mouth 2 (two) times daily. To take with 12.5 mg to make 18.75 mg 03/30/18   Hochrein, James, MD  diazepam (VALIUM) 10 MG tablet Take 10 mg by mouth 3 (three) times daily as needed for anxiety.    [provider]  Docusate Calcium (STOOL SOFTENER PO) Take 2 tablets by mouth daily as needed (constipation).    [provider]  FLUoxetine (PROZAC) 20 MG capsule Take 20 mg by mouth daily.    [provider]  fluticasone (FLONASE) 50 MCG/ACT nasal spray Place 1 spray into the nose 2 (two) times daily.     [provider]  furosemide (LASIX) 40 MG tablet Take 40 mg by mouth daily.    [provider]  Halobetasol-Ammonium Lactate 0.05 & 12 % (CREAM) KIT Apply 1 application topically daily as needed (dryness).    [provider]  HYDROcodone-acetaminophen (NORCO/VICODIN) 5-325 MG tablet Take 1 tablet by mouth every 6 (six) hours as  needed for moderate pain. 04/23/18   Nitka, James E, MD  ibuprofen (ADVIL,MOTRIN) 200 MG tablet Take 200 mg by mouth daily as needed for headache or mild pain.     [provider]  loratadine (CLARITIN) 10 MG tablet Take 10 mg by mouth daily.    [provider]  losartan (COZAAR) 50 MG tablet Take 50 mg by mouth daily.    [provider]  meloxicam (MOBIC) 15 MG tablet Take 15 mg by mouth daily.     [provider]  montelukast (SINGULAIR) 10 MG tablet Take 10 mg by mouth at bedtime.    [provider]  Multiple Vitamin (MULTIVITAMIN) capsule Take 1 capsule by mouth daily. 02/21/15   Zimmerman, Donielle M, PA-C  Olopatadine HCl (PATANASE) 0.6 % SOLN Place into the nose.    [provider]  omeprazole (PRILOSEC) 20 MG capsule Take 40 mg by mouth daily.    [provider]  polyethylene glycol (MIRALAX / GLYCOLAX) packet Take 17 g by mouth daily as needed for mild constipation.    [provider]  Probiotic Product (PROBIOTIC DAILY PO) Take 1 capsule by mouth daily. Ultimate flora    [provider]  pseudoephedrine-guaifenesin (MUCINEX D) 60-600 MG per tablet Take 1 tablet by mouth daily as needed for congestion.     [provider]  ranitidine (ZANTAC) 300 MG capsule Take 300 mg by mouth every evening.    [provider]  Sodium Chloride-Sodium Bicarb (CLASSIC NETI POT SINUS WASH NA) Place 2 sprays into the nose daily.     [provider]  traMADol-acetaminophen (ULTRACET) 37.5-325 MG tablet Take 1 tablet by mouth every 8 (eight) hours as needed. 11/28/17   Yates, Mark C, MD    Family History Family History  Problem Relation Age of Onset  . Pancreatic cancer Mother 58  . Diabetes Father   . Hypertension Father   . Heart disease Father 59         CAD  . Breast cancer Sister   . Diabetes Maternal Grandmother   . Hypertension Maternal Grandmother   . Cancer Maternal Grandfather         colon or stomach  . Hypertension Paternal Grandmother   . Heart disease Paternal Grandmother        Later onset  . Diverticulosis Paternal Grandmother   . Asthma Grandchild     Social History Social History   Tobacco Use  . Smoking status: Current Every Day Smoker    Packs/day: 0.50    Years: 50.00    Pack years: 25.00    Types: Cigarettes  . Smokeless tobacco: Never Used  . Tobacco comment: uses vapor cig  Substance Use Topics  . Alcohol use: No    Alcohol/week: 0.0 oz  . Drug use: No     Allergies   Patient has no known allergies.   Review of Systems Review of Systems  All other systems reviewed and are negative.    Physical Exam Updated Vital Signs BP 133/68 (BP Location: Right Arm)   Pulse 98   Temp 99 F (37.2 C) (Oral)   Resp 12   Ht 5' 1" (1.549 m)   Wt 55.3 kg (122 lb)   SpO2 97%   BMI 23.05 kg/m   Physical Exam  Constitutional: She appears well-developed and well-nourished. No distress.  HENT:  Head: Normocephalic and atraumatic.  Mouth/Throat: Oropharynx is clear and moist. No oropharyngeal exudate.  Eyes: Pupils are equal, round, and reactive to light. Conjunctivae and EOM are normal. Right eye exhibits no discharge. Left eye exhibits no discharge. No scleral icterus.  Neck: Normal range of motion. Neck supple. No JVD present. No thyromegaly present.  Cardiovascular: Normal rate, regular rhythm, normal heart sounds and intact distal pulses. Exam reveals no gallop and no friction rub.  No murmur heard. Pulmonary/Chest: Effort normal. No respiratory distress. She has no wheezes. She has rales.  Rhonchi and rales bilaterally, mild tachypneic, speaks in just shortened sentences  Abdominal: Soft. Bowel sounds are normal. She exhibits no distension and no mass. There is no tenderness.  Musculoskeletal: Normal range of motion. She exhibits edema. She exhibits no tenderness.  Lymphadenopathy:    She has no cervical adenopathy.  Neurological: She is  alert. Coordination normal.  Skin: Skin is warm and dry. No rash noted. No erythema.  Psychiatric: She has a normal mood and affect. Her behavior is normal.  Nursing note and vitals reviewed.    ED Treatments / Results  Labs (all labs ordered are listed, but only abnormal results are displayed) Labs Reviewed  CBC WITH DIFFERENTIAL/PLATELET  COMPREHENSIVE METABOLIC PANEL  BRAIN NATRIURETIC PEPTIDE  I-STAT CG4 LACTIC ACID, ED  I-STAT TROPONIN, ED    EKG EKG Interpretation  Date/Time:  Monday May 04 2018 19:47:10 EDT Ventricular Rate:  95 PR Interval:    QRS Duration: 116 QT Interval:  415 QTC Calculation: 522 R Axis:   -74 Text Interpretation:  Sinus rhythm, paced LVH with IVCD, LAD and secondary repol abnrm ST elevation inferior leads Prolonged QT interval Probable RV involvement, suggest recording right precordial leads since last tracing no significant change Confirmed by Noemi Chapel [phone]) on 05/04/2018 7:50:36 PM   Radiology No results found.  Procedures .Critical Care Performed by: Noemi Chapel, MD Authorized by: Noemi Chapel, MD   Critical care provider statement:    Critical care time (minutes):  35   Critical care time was exclusive of:  Separately billable procedures and treating other  patients and teaching time   Critical care was necessary to treat or prevent imminent or life-threatening deterioration of the following conditions:  Sepsis   Critical care was time spent personally by me on the following activities:  Blood draw for specimens, development of treatment plan with patient or surrogate, discussions with consultants, evaluation of patient's response to treatment, examination of patient, obtaining history from patient or surrogate, ordering and performing treatments and interventions, ordering and review of laboratory studies, ordering and review of radiographic studies, pulse oximetry, re-evaluation of patient's condition and review of old charts    (including critical care time)  Medications Ordered in ED Medications - No data to display   Initial Impression / Assessment and Plan / ED Course  I have reviewed the triage vital signs and the nursing notes.  Pertinent labs & imaging results that were available during my care of the patient were reviewed by me and considered in my medical decision making (see chart for details).     The patient appears to be fluid overloaded, she has both subtle rales and rhonchi but also has significant 2+ bilateral symmetrical pitting edema of the lower extremities to the mid thigh.  The patient does have a history of heart failure as well as a history of aortic valve replacement with a bovine valve.  She has COPD but does not appear to be significantly wheezing.  She will get a nebulizer treatment, chest x-ray and treated for some congestive heart failure.  Evaluate for pneumothorax or pneumonia as alternative sources of shortness of breath.  The x-ray confirms that the patient has the hilar mass as well as having some signs of infection in the right lung.  This would explain the patient's leukocytosis, increased work of breathing, increased phlegm production and oxygen requirement.  She will need to be admitted to the hospital.  She was given Rocephin and Zithromax.  Blood cultures added - Pulse > 90 WBC > 19,000 Hypoxic and Tachypnic Xray shows PNA Pt is septic from pna.    Final Clinical Impressions(s) / ED Diagnoses   Final diagnoses:  Community acquired pneumonia of right upper lobe of lung (HCC)      , , MD 05/06/18 [id number]  

## 2018-05-04 NOTE — ED Notes (Signed)
Mordecai Rasmussen (niece) # in chart, call with any questions

## 2018-05-04 NOTE — ED Triage Notes (Signed)
BIB EMS from home, pt reports productive cough X4 wks, SOB X2 hrs. Pt has 3+ pitting edema to BLE, hx CHF. 84% RA, placed on 3L [place].

## 2018-05-05 ENCOUNTER — Encounter (HOSPITAL_COMMUNITY): Payer: Self-pay | Admitting: Family Medicine

## 2018-05-05 DIAGNOSIS — I5032 Chronic diastolic (congestive) heart failure: Secondary | ICD-10-CM | POA: Diagnosis not present

## 2018-05-05 DIAGNOSIS — E1165 Type 2 diabetes mellitus with hyperglycemia: Secondary | ICD-10-CM | POA: Diagnosis present

## 2018-05-05 DIAGNOSIS — E0781 Sick-euthyroid syndrome: Secondary | ICD-10-CM | POA: Diagnosis present

## 2018-05-05 DIAGNOSIS — R918 Other nonspecific abnormal finding of lung field: Secondary | ICD-10-CM | POA: Diagnosis not present

## 2018-05-05 DIAGNOSIS — J189 Pneumonia, unspecified organism: Secondary | ICD-10-CM | POA: Diagnosis present

## 2018-05-05 DIAGNOSIS — Z515 Encounter for palliative care: Secondary | ICD-10-CM | POA: Diagnosis not present

## 2018-05-05 DIAGNOSIS — K219 Gastro-esophageal reflux disease without esophagitis: Secondary | ICD-10-CM | POA: Diagnosis present

## 2018-05-05 DIAGNOSIS — J9622 Acute and chronic respiratory failure with hypercapnia: Secondary | ICD-10-CM | POA: Diagnosis present

## 2018-05-05 DIAGNOSIS — C771 Secondary and unspecified malignant neoplasm of intrathoracic lymph nodes: Secondary | ICD-10-CM | POA: Diagnosis not present

## 2018-05-05 DIAGNOSIS — R739 Hyperglycemia, unspecified: Secondary | ICD-10-CM | POA: Diagnosis not present

## 2018-05-05 DIAGNOSIS — C779 Secondary and unspecified malignant neoplasm of lymph node, unspecified: Secondary | ICD-10-CM | POA: Diagnosis present

## 2018-05-05 DIAGNOSIS — Z7951 Long term (current) use of inhaled steroids: Secondary | ICD-10-CM | POA: Diagnosis not present

## 2018-05-05 DIAGNOSIS — K224 Dyskinesia of esophagus: Secondary | ICD-10-CM | POA: Diagnosis present

## 2018-05-05 DIAGNOSIS — J440 Chronic obstructive pulmonary disease with acute lower respiratory infection: Secondary | ICD-10-CM | POA: Diagnosis present

## 2018-05-05 DIAGNOSIS — I5033 Acute on chronic diastolic (congestive) heart failure: Secondary | ICD-10-CM | POA: Diagnosis not present

## 2018-05-05 DIAGNOSIS — G9341 Metabolic encephalopathy: Secondary | ICD-10-CM | POA: Diagnosis present

## 2018-05-05 DIAGNOSIS — I5043 Acute on chronic combined systolic (congestive) and diastolic (congestive) heart failure: Secondary | ICD-10-CM | POA: Diagnosis present

## 2018-05-05 DIAGNOSIS — I110 Hypertensive heart disease with heart failure: Secondary | ICD-10-CM | POA: Diagnosis present

## 2018-05-05 DIAGNOSIS — T17908A Unspecified foreign body in respiratory tract, part unspecified causing other injury, initial encounter: Secondary | ICD-10-CM | POA: Diagnosis not present

## 2018-05-05 DIAGNOSIS — Z79899 Other long term (current) drug therapy: Secondary | ICD-10-CM | POA: Diagnosis not present

## 2018-05-05 DIAGNOSIS — I2510 Atherosclerotic heart disease of native coronary artery without angina pectoris: Secondary | ICD-10-CM | POA: Diagnosis present

## 2018-05-05 DIAGNOSIS — Z85828 Personal history of other malignant neoplasm of skin: Secondary | ICD-10-CM | POA: Diagnosis not present

## 2018-05-05 DIAGNOSIS — E278 Other specified disorders of adrenal gland: Secondary | ICD-10-CM | POA: Diagnosis present

## 2018-05-05 DIAGNOSIS — E11649 Type 2 diabetes mellitus with hypoglycemia without coma: Secondary | ICD-10-CM | POA: Diagnosis not present

## 2018-05-05 DIAGNOSIS — J181 Lobar pneumonia, unspecified organism: Secondary | ICD-10-CM | POA: Diagnosis present

## 2018-05-05 DIAGNOSIS — J449 Chronic obstructive pulmonary disease, unspecified: Secondary | ICD-10-CM | POA: Diagnosis not present

## 2018-05-05 DIAGNOSIS — E279 Disorder of adrenal gland, unspecified: Secondary | ICD-10-CM | POA: Diagnosis not present

## 2018-05-05 DIAGNOSIS — J9621 Acute and chronic respiratory failure with hypoxia: Secondary | ICD-10-CM | POA: Diagnosis present

## 2018-05-05 DIAGNOSIS — C3490 Malignant neoplasm of unspecified part of unspecified bronchus or lung: Secondary | ICD-10-CM | POA: Diagnosis present

## 2018-05-05 DIAGNOSIS — Z95810 Presence of automatic (implantable) cardiac defibrillator: Secondary | ICD-10-CM | POA: Diagnosis not present

## 2018-05-05 DIAGNOSIS — Z7189 Other specified counseling: Secondary | ICD-10-CM | POA: Diagnosis not present

## 2018-05-05 DIAGNOSIS — R590 Localized enlarged lymph nodes: Secondary | ICD-10-CM | POA: Diagnosis present

## 2018-05-05 DIAGNOSIS — F17210 Nicotine dependence, cigarettes, uncomplicated: Secondary | ICD-10-CM | POA: Diagnosis present

## 2018-05-05 DIAGNOSIS — Z7982 Long term (current) use of aspirin: Secondary | ICD-10-CM | POA: Diagnosis not present

## 2018-05-05 DIAGNOSIS — Z952 Presence of prosthetic heart valve: Secondary | ICD-10-CM | POA: Diagnosis not present

## 2018-05-05 DIAGNOSIS — J441 Chronic obstructive pulmonary disease with (acute) exacerbation: Secondary | ICD-10-CM | POA: Diagnosis present

## 2018-05-05 LAB — CBC WITH DIFFERENTIAL/PLATELET
BASOS ABS: 0 10*3/uL (ref 0.0–0.1)
Basophils Relative: 0 %
EOS ABS: 0 10*3/uL (ref 0.0–0.7)
EOS PCT: 0 %
HEMATOCRIT: 31.7 % — AB (ref 36.0–46.0)
Hemoglobin: 10.2 g/dL — ABNORMAL LOW (ref 12.0–15.0)
Lymphocytes Relative: 5 %
Lymphs Abs: 1.1 10*3/uL (ref 0.7–4.0)
MCH: 30.5 pg (ref 26.0–34.0)
MCHC: 32.2 g/dL (ref 30.0–36.0)
MCV: 94.9 fL (ref 78.0–100.0)
MONO ABS: 1 10*3/uL (ref 0.1–1.0)
MONOS PCT: 5 %
NEUTROS ABS: 18.4 10*3/uL — AB (ref 1.7–7.7)
Neutrophils Relative %: 90 %
PLATELETS: 431 10*3/uL — AB (ref 150–400)
RBC: 3.34 MIL/uL — ABNORMAL LOW (ref 3.87–5.11)
RDW: 14.6 % (ref 11.5–15.5)
WBC: 20.4 10*3/uL — ABNORMAL HIGH (ref 4.0–10.5)

## 2018-05-05 LAB — GLUCOSE, CAPILLARY
GLUCOSE-CAPILLARY: 166 mg/dL — AB (ref 65–99)
GLUCOSE-CAPILLARY: 225 mg/dL — AB (ref 65–99)
Glucose-Capillary: 180 mg/dL — ABNORMAL HIGH (ref 65–99)
Glucose-Capillary: 318 mg/dL — ABNORMAL HIGH (ref 65–99)
Glucose-Capillary: 198 mg/dL — ABNORMAL HIGH (ref 65–99)

## 2018-05-05 LAB — STREP PNEUMONIAE URINARY ANTIGEN: STREP PNEUMO URINARY ANTIGEN: NEGATIVE

## 2018-05-05 LAB — URINALYSIS, ROUTINE W REFLEX MICROSCOPIC
Bilirubin Urine: NEGATIVE
Glucose, UA: 50 mg/dL — AB
Ketones, ur: NEGATIVE mg/dL
LEUKOCYTES UA: NEGATIVE
Nitrite: NEGATIVE
PH: 6 (ref 5.0–8.0)
Protein, ur: 30 mg/dL — AB
SPECIFIC GRAVITY, URINE: 1.014 (ref 1.005–1.030)

## 2018-05-05 LAB — BASIC METABOLIC PANEL
ANION GAP: 12 (ref 5–15)
BUN: 21 mg/dL — ABNORMAL HIGH (ref 6–20)
CALCIUM: 9 mg/dL (ref 8.9–10.3)
CO2: 35 mmol/L — AB (ref 22–32)
CREATININE: 0.82 mg/dL (ref 0.44–1.00)
Chloride: 93 mmol/L — ABNORMAL LOW (ref 101–111)
GFR calc Af Amer: >60 mL/min (ref >60)
GLUCOSE: 268 mg/dL — AB (ref 65–99)
Potassium: 3.2 mmol/L — ABNORMAL LOW (ref 3.5–5.1)
Sodium: 140 mmol/L (ref 135–145)

## 2018-05-05 LAB — MAGNESIUM: MAGNESIUM: 2.1 mg/dL (ref 1.7–2.4)

## 2018-05-05 LAB — CBG MONITORING, ED: GLUCOSE-CAPILLARY: 261 mg/dL — AB (ref 65–99)

## 2018-05-05 MED ORDER — MAGNESIUM SULFATE IN D5W 1-5 GM/100ML-% IV SOLN
1.0000 g | Freq: Once | INTRAVENOUS | Status: AC
  Administered 2018-05-05: 1 g via INTRAVENOUS
  Filled 2018-05-05: qty 100

## 2018-05-05 MED ORDER — SODIUM CHLORIDE 0.9 % IV SOLN
1.0000 g | INTRAVENOUS | Status: DC
  Administered 2018-05-05 – 2018-05-08 (×4): 1 g via INTRAVENOUS
  Filled 2018-05-05 (×4): qty 10

## 2018-05-05 MED ORDER — POLYVINYL ALCOHOL 1.4 % OP SOLN
1.0000 [drp] | Freq: Three times a day (TID) | OPHTHALMIC | Status: DC | PRN
  Filled 2018-05-05: qty 15

## 2018-05-05 MED ORDER — GABAPENTIN 100 MG PO CAPS
100.0000 mg | ORAL_CAPSULE | Freq: Three times a day (TID) | ORAL | Status: DC
  Administered 2018-05-05 – 2018-05-14 (×28): 100 mg via ORAL
  Filled 2018-05-05 (×28): qty 1

## 2018-05-05 MED ORDER — SODIUM CHLORIDE 0.9 % IV SOLN: 250.0000 mL | INTRAVENOUS | Status: DC | PRN

## 2018-05-05 MED ORDER — ASPIRIN EC 325 MG PO TBEC
325.0000 mg | DELAYED_RELEASE_TABLET | Freq: Every day | ORAL | Status: DC
  Administered 2018-05-05 – 2018-05-14 (×10): 325 mg via ORAL
  Filled 2018-05-05 (×10): qty 1

## 2018-05-05 MED ORDER — SODIUM CHLORIDE 0.9% FLUSH
3.0000 mL | Freq: Two times a day (BID) | INTRAVENOUS | Status: DC
  Administered 2018-05-05 – 2018-05-13 (×14): 3 mL via INTRAVENOUS

## 2018-05-05 MED ORDER — ALBUTEROL SULFATE (2.5 MG/3ML) 0.083% IN NEBU
2.5000 mg | INHALATION_SOLUTION | Freq: Four times a day (QID) | RESPIRATORY_TRACT | Status: DC | PRN
  Administered 2018-05-06: 2.5 mg via RESPIRATORY_TRACT
  Filled 2018-05-05: qty 3

## 2018-05-05 MED ORDER — ADULT MULTIVITAMIN W/MINERALS CH
1.0000 | ORAL_TABLET | Freq: Every day | ORAL | Status: DC
  Administered 2018-05-05 – 2018-05-14 (×10): 1 via ORAL
  Filled 2018-05-05 (×10): qty 1

## 2018-05-05 MED ORDER — ENOXAPARIN SODIUM 30 MG/0.3ML ~~LOC~~ SOLN
30.0000 mg | SUBCUTANEOUS | Status: DC
  Administered 2018-05-05 – 2018-05-06 (×2): 30 mg via SUBCUTANEOUS
  Filled 2018-05-05 (×3): qty 0.3

## 2018-05-05 MED ORDER — FUROSEMIDE 20 MG PO TABS: 40.0000 mg | ORAL_TABLET | Freq: Every day | ORAL | Status: DC

## 2018-05-05 MED ORDER — INSULIN ASPART 100 UNIT/ML ~~LOC~~ SOLN
0.0000 [IU] | Freq: Three times a day (TID) | SUBCUTANEOUS | Status: DC
  Administered 2018-05-05: 2 [IU] via SUBCUTANEOUS
  Administered 2018-05-05: 7 [IU] via SUBCUTANEOUS
  Administered 2018-05-05 – 2018-05-06 (×2): 2 [IU] via SUBCUTANEOUS
  Administered 2018-05-06: 5 [IU] via SUBCUTANEOUS
  Administered 2018-05-06: 7 [IU] via SUBCUTANEOUS
  Administered 2018-05-07: 2 [IU] via SUBCUTANEOUS
  Administered 2018-05-07 (×2): 3 [IU] via SUBCUTANEOUS
  Administered 2018-05-08 (×2): 2 [IU] via SUBCUTANEOUS
  Administered 2018-05-09: 1 [IU] via SUBCUTANEOUS
  Administered 2018-05-09: 2 [IU] via SUBCUTANEOUS
  Administered 2018-05-10: 1 [IU] via SUBCUTANEOUS
  Administered 2018-05-11 (×2): 2 [IU] via SUBCUTANEOUS
  Administered 2018-05-11 – 2018-05-12 (×2): 3 [IU] via SUBCUTANEOUS
  Administered 2018-05-12: 7 [IU] via SUBCUTANEOUS
  Administered 2018-05-12: 5 [IU] via SUBCUTANEOUS
  Administered 2018-05-13: 2 [IU] via SUBCUTANEOUS
  Administered 2018-05-13 – 2018-05-14 (×2): 3 [IU] via SUBCUTANEOUS

## 2018-05-05 MED ORDER — MONTELUKAST SODIUM 10 MG PO TABS
10.0000 mg | ORAL_TABLET | Freq: Every day | ORAL | Status: DC
  Administered 2018-05-05 – 2018-05-13 (×9): 10 mg via ORAL
  Filled 2018-05-05 (×9): qty 1

## 2018-05-05 MED ORDER — HYDROCODONE-ACETAMINOPHEN 5-325 MG PO TABS
1.0000 | ORAL_TABLET | ORAL | Status: DC | PRN
  Administered 2018-05-07 – 2018-05-11 (×4): 2 via ORAL
  Administered 2018-05-11: 1 via ORAL
  Administered 2018-05-12 – 2018-05-13 (×3): 2 via ORAL
  Administered 2018-05-14: 1 via ORAL
  Filled 2018-05-05 (×2): qty 2
  Filled 2018-05-05: qty 1
  Filled 2018-05-05 (×6): qty 2

## 2018-05-05 MED ORDER — SODIUM CHLORIDE 0.9% FLUSH
3.0000 mL | Freq: Two times a day (BID) | INTRAVENOUS | Status: DC
  Administered 2018-05-05 – 2018-05-13 (×8): 3 mL via INTRAVENOUS

## 2018-05-05 MED ORDER — BISACODYL 5 MG PO TBEC: 5.0000 mg | DELAYED_RELEASE_TABLET | Freq: Every day | ORAL | Status: DC | PRN

## 2018-05-05 MED ORDER — IPRATROPIUM-ALBUTEROL 0.5-2.5 (3) MG/3ML IN SOLN
3.0000 mL | Freq: Four times a day (QID) | RESPIRATORY_TRACT | Status: DC
  Administered 2018-05-05: 3 mL via RESPIRATORY_TRACT
  Filled 2018-05-05: qty 3

## 2018-05-05 MED ORDER — CARVEDILOL 12.5 MG PO TABS: 6.2500 mg | ORAL_TABLET | Freq: Two times a day (BID) | ORAL | Status: DC

## 2018-05-05 MED ORDER — SENNOSIDES-DOCUSATE SODIUM 8.6-50 MG PO TABS: 1.0000 | ORAL_TABLET | Freq: Every evening | ORAL | Status: DC | PRN

## 2018-05-05 MED ORDER — CARVEDILOL 3.125 MG PO TABS: 3.1250 mg | ORAL_TABLET | Freq: Two times a day (BID) | ORAL | Status: DC

## 2018-05-05 MED ORDER — ONDANSETRON HCL 4 MG PO TABS: 4.0000 mg | ORAL_TABLET | Freq: Four times a day (QID) | ORAL | Status: DC | PRN

## 2018-05-05 MED ORDER — SODIUM CHLORIDE 0.9 % IV SOLN
500.0000 mg | INTRAVENOUS | Status: DC
  Administered 2018-05-05 – 2018-05-07 (×3): 500 mg via INTRAVENOUS
  Filled 2018-05-05 (×4): qty 500

## 2018-05-05 MED ORDER — INSULIN ASPART 100 UNIT/ML ~~LOC~~ SOLN
0.0000 [IU] | Freq: Every day | SUBCUTANEOUS | Status: DC
  Administered 2018-05-05 – 2018-05-06 (×2): 3 [IU] via SUBCUTANEOUS
  Administered 2018-05-07 – 2018-05-08 (×2): 2 [IU] via SUBCUTANEOUS
  Administered 2018-05-11: 4 [IU] via SUBCUTANEOUS
  Filled 2018-05-05 (×2): qty 1

## 2018-05-05 MED ORDER — SODIUM CHLORIDE 0.9% FLUSH: 3.0000 mL | INTRAVENOUS | Status: DC | PRN

## 2018-05-05 MED ORDER — POTASSIUM CHLORIDE 10 MEQ/100ML IV SOLN
10.0000 meq | INTRAVENOUS | Status: AC
  Administered 2018-05-05 (×2): 10 meq via INTRAVENOUS
  Filled 2018-05-05 (×2): qty 100

## 2018-05-05 MED ORDER — FLUOXETINE HCL 20 MG PO CAPS
20.0000 mg | ORAL_CAPSULE | Freq: Every day | ORAL | Status: DC
  Administered 2018-05-05 – 2018-05-14 (×10): 20 mg via ORAL
  Filled 2018-05-05 (×10): qty 1

## 2018-05-05 MED ORDER — ACETAMINOPHEN 650 MG RE SUPP: 650.0000 mg | Freq: Four times a day (QID) | RECTAL | Status: DC | PRN

## 2018-05-05 MED ORDER — METHYLPREDNISOLONE SODIUM SUCC 125 MG IJ SOLR
60.0000 mg | Freq: Four times a day (QID) | INTRAMUSCULAR | Status: DC
  Administered 2018-05-05 – 2018-05-07 (×8): 60 mg via INTRAVENOUS
  Filled 2018-05-05 (×9): qty 2

## 2018-05-05 MED ORDER — LOSARTAN POTASSIUM 50 MG PO TABS
50.0000 mg | ORAL_TABLET | Freq: Every day | ORAL | Status: DC
  Administered 2018-05-05 – 2018-05-14 (×10): 50 mg via ORAL
  Filled 2018-05-05 (×10): qty 1

## 2018-05-05 MED ORDER — CARVEDILOL 6.25 MG PO TABS
18.7500 mg | ORAL_TABLET | Freq: Two times a day (BID) | ORAL | Status: DC
  Administered 2018-05-05 – 2018-05-14 (×19): 18.75 mg via ORAL
  Filled 2018-05-05 (×19): qty 1

## 2018-05-05 MED ORDER — ACETAMINOPHEN 325 MG PO TABS: 650.0000 mg | ORAL_TABLET | Freq: Four times a day (QID) | ORAL | Status: DC | PRN

## 2018-05-05 MED ORDER — ONDANSETRON HCL 4 MG/2ML IJ SOLN: 4.0000 mg | Freq: Four times a day (QID) | INTRAMUSCULAR | Status: DC | PRN

## 2018-05-05 MED ORDER — POTASSIUM CHLORIDE CRYS ER 20 MEQ PO TBCR
20.0000 meq | EXTENDED_RELEASE_TABLET | Freq: Once | ORAL | Status: AC
  Administered 2018-05-05: 20 meq via ORAL
  Filled 2018-05-05: qty 1

## 2018-05-05 MED ORDER — FUROSEMIDE 10 MG/ML IJ SOLN
40.0000 mg | Freq: Two times a day (BID) | INTRAMUSCULAR | Status: DC
  Administered 2018-05-05 – 2018-05-07 (×7): 40 mg via INTRAVENOUS
  Filled 2018-05-05 (×8): qty 4

## 2018-05-05 NOTE — Progress Notes (Signed)
Inpatient Diabetes Program Recommendations  AACE/ADA: New Consensus Statement on Inpatient Glycemic Control (2015)  Target Ranges:  Prepandial:   less than 140 mg/dL      Peak postprandial:   less than 180 mg/dL (1-2 hours)      Critically ill patients:  140 - 180 mg/dL  Results for TAHIRI, SHAREEF (MRN [id number]) as of 05/05/2018 11:07  Ref. Range 05/05/2018 02:41 05/05/2018 08:11  Glucose-Capillary Latest Ref Range: 65 - 99 mg/dL 261 (H) 166 (H)   Results for DEMYAH, SMYRE (MRN [id number]) as of 05/05/2018 11:07  Ref. Range 05/04/2018 20:31 05/05/2018 02:44  Glucose Latest Ref Range: 65 - 99 mg/dL 271 (H) 268 (H)  Results for SAANVIKA, VAZQUES (MRN [id number]) as of 05/05/2018 11:07  Ref. Range 01/11/2015 09:06  Hemoglobin A1C Latest Ref Range: <5.7 % 6.2 (H)   Review of Glycemic Control  Diabetes history: NO Outpatient Diabetes medications: NA Current orders for Inpatient glycemic control: Novolog 0-9 units TID with meals, Novolog 0-5 units QHS  Inpatient Diabetes Program Recommendations: HgbA1C: Please consider ordering an A1C to evaluate glycemic control over the past 2-3 months.  NOTE: Patient is ordered Solumedrol 60 mg Q6H which is contributing to hyperglycemia.   Thanks, Barnie Alderman, RN, MSN, CDE Diabetes Coordinator Inpatient Diabetes Program [phone] (Team Pager from 8am to 5pm)

## 2018-05-05 NOTE — Progress Notes (Signed)
Patient arrived to the unit from [facility]. Patient is alert and oriented. No complaints of pain. Patient is on 3 liters of oxygen.  Placed on telemetry.  Skin assessment complete.  IV intact to the left antecubital.  Bruise to the right buttocks and arms bilaterally. Educated the patient on how to reach the staff on the unit.  Lowered the bed, activated the bed alarm and placed the call light within reach.  Will continue to monitor the patient

## 2018-05-05 NOTE — H&P (Signed)
History and Physical    Carol Warren NLG:[id number] DOB: 24-Dec-1946 DOA: 05/04/2018  PCP: Ernestene Kiel, MD   Patient coming from: Home  Chief Complaint: Productive cough, SOB   HPI: Carol Warren is a 72 y.o. female with medical history significant for COPD, chronic diastolic CHF, and tobacco abuse, now presenting to the emergency department for evaluation of productive cough and shortness of breath.  Patient saw her PCP last month with these complaints, was treated with Augmentin, but symptoms continued to progress.  She denies chest pain or palpitations, reports chronic unchanged lower extremity swelling.  Outpatient CT was performed last month, notable for adenopathy in the chest, bilateral axilla, and bilateral adrenal masses concerning for metastatic disease.  ED Course: Upon arrival to the ED, patient is found to be afebrile, saturating 84% on room air, slightly tachypneic, and vitals otherwise stable.  EKG features a sinus rhythm with LVH with, IVCD, and repolarization abnormality.  Chest x-ray is notable for new subpleural airspace opacity in the right upper and middle lobes coarsened interstitial markings concerning for pneumonia, possibly postobstructive.  Chemistry panel is notable for potassium 3.2 and glucose 271.  CBC features a leukocytosis to 19,700 and thrombocytosis with platelets 431,000.  Lactic acid is normal, troponin is normal, and BNP is elevated at 1399.  Blood cultures were collected and patient was given Rocephin, azithromycin, and IV fluids.  She will be admitted for ongoing evaluation and management of suspected postobstructive pneumonia.  Review of Systems:  All other systems reviewed and apart from HPI, are negative.  Past Medical History:  Diagnosis Date  . Adrenal tumor   . AICD (automatic cardioverter/defibrillator) present   . Anxiety   . Aortic aneurysm ([place])   . Asthma   . Bicuspid aortic valve    CONGENITAL  . Cancer ([place])    skin   . Carpal tunnel  syndrome, bilateral   . Chronic depression   . Chronic sinusitis   . 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 COPD (chronic obstructive pulmonary disease) ([place])   . Coronary artery disease   . Degenerative disc disease    CERVICAL AND LUMBAR (DR. Lorin [facility])  . GERD (gastroesophageal reflux disease)   . HSV-1 (herpes simplex virus 1) infection   . HSV-2 (herpes simplex virus 2) infection   . Hypertension   . Insomnia   . Lumbar stenosis   . Osteoarthritis   . Osteopenia   . Pernicious anemia   . Plantar fasciitis   . Smoker   . Venous insufficiency of left leg   . Vitamin B 12 deficiency     Past Surgical History:  Procedure Laterality Date  . AORTIC VALVE REPLACEMENT N/A 01/12/2015   Procedure: AORTIC VALVE REPLACEMENT (AVR);  Surgeon: Gaye Pollack, MD;  Location: [facility];  Service: Open Heart Surgery;  Laterality: N/A;  . ASCENDING AORTIC ROOT REPLACEMENT N/A 01/12/2015   Procedure: ASCENDING AORTIC ROOT REPLACEMENT;  Surgeon: Gaye Pollack, MD;  Location: [place] [facility];  Service: Open Heart Surgery;  Laterality: N/A;  . BI-VENTRICULAR PACEMAKER INSERTION N/A 01/18/2015   Procedure: BI-VENTRICULAR PACEMAKER INSERTION (CRT-P);  Surgeon: Evans Lance, MD;  Location: [facility] CATH LAB;  Service: Cardiovascular;  Laterality: N/A;  . BUNIONECTOMY WITH HAMMERTOE RECONSTRUCTION Right   . CARPAL TUNNEL RELEASE Bilateral 2008  . CATARACT EXTRACTION Bilateral    X2  . CATARACT EXTRACTION W/ INTRAOCULAR LENS  IMPLANT, BILATERAL    . COLONOSCOPY    . CYST EXCISION  right side of throat  . EXCISION OF BASAL CELL CA     SKIN   . EXCISION OF VULVAR CIS    . EYE SURGERY    . FUNCTIONAL ENDOSCOPIC SINUS SURGERY    . LEFT AND RIGHT HEART CATHETERIZATION WITH CORONARY ANGIOGRAM N/A 09/23/2014   Procedure: LEFT AND RIGHT HEART CATHETERIZATION WITH CORONARY ANGIOGRAM;  Surgeon: Minus Breeding, MD;  Location: [facility] CATH LAB;  Service: Cardiovascular;  Laterality: N/A;  . MASS EXCISION Right 04/03/2017    Procedure: EXCISION CYST DEBRIDMENT DISTAL INTERPHALANEAL RIGHT INDEX FINGER;  Surgeon: Daryll Brod, MD;  Location: [place];  Service: Orthopedics;  Laterality: Right;  REG/FAB  . MULTIPLE TOOTH EXTRACTIONS    . SINUS PROCEDURE  2009   x 6  . TEE WITHOUT CARDIOVERSION N/A 10/06/2014   Procedure: TRANSESOPHAGEAL ECHOCARDIOGRAM (TEE);  Surgeon: Sueanne Margarita, MD;  Location[facility] ENDOSCOPY;  Service: Cardiovascular;  Laterality: N/A;  . TEE WITHOUT CARDIOVERSION N/A 01/12/2015   Procedure: TRANSESOPHAGEAL ECHOCARDIOGRAM (TEE);  Surgeon: Gaye Pollack, MD;  Location: [place];  Service: Open Heart Surgery;  Laterality: N/A;  . UPPER GASTROINTESTINAL ENDOSCOPY       reports that she has been smoking cigarettes.  She has a 25.00 pack-year smoking history. She has never used smokeless tobacco. She reports that she does not drink alcohol or use drugs.  No Known Allergies  Family History  Problem Relation Age of Onset  . Pancreatic cancer Mother 93  . Diabetes Father   . Hypertension Father   . Heart disease Father 21       CAD  . Breast cancer Sister   . Diabetes Maternal Grandmother   . Hypertension Maternal Grandmother   . Cancer Maternal Grandfather        colon or stomach  . Hypertension Paternal Grandmother   . Heart disease Paternal Grandmother        Later onset  . Diverticulosis Paternal Grandmother   . Asthma Grandchild      Prior to Admission medications   Medication Sig Start Date End Date Taking? Authorizing Provider  albuterol (PROVENTIL HFA;VENTOLIN HFA) 108 (90 BASE) MCG/ACT inhaler Inhale 1-2 puffs into the lungs every 6 (six) hours as needed for wheezing or shortness of breath (depends on congestion if 1-2 puffs).    Yes [provider]  albuterol (PROVENTIL) (2.5 MG/3ML) 0.083% nebulizer solution Take 2.5 mg by nebulization 2 (two) times daily as needed for wheezing.   Yes [provider]  amoxicillin-clavulanate (AUGMENTIN) 875-125 MG tablet Take 1 tablet  by mouth 2 (two) times daily.   Yes [provider]  Artificial Tear Solution (GENTEAL TEARS OP) Apply 1 drop to eye 3 (three) times daily as needed (dry eyes).   Yes [provider]  aspirin EC 325 MG EC tablet Take 1 tablet (325 mg total) by mouth daily. 01/20/15  Yes Lars Pinks M, PA-C  Calcium Carbonate-Vitamin D (CALCIUM-D PO) Take 1 tablet by mouth daily.    Yes [provider]  carboxymethylcellulose (REFRESH PLUS) 0.5 % SOLN Place 1 drop into both eyes as needed (dry eyes).   Yes [provider]  carvedilol (COREG) 12.5 MG tablet TAKE 1 TABLET BY MOUTH TWICE DAILY WITH A MEAL 04/30/18  Yes Evans Lance, MD  carvedilol (COREG) 6.25 MG tablet Take 1 tablet (6.25 mg total) by mouth 2 (two) times daily. To take with 12.5 mg to make 18.75 mg 03/30/18  Yes Minus Breeding, MD  cetirizine (ZYRTEC) 10  MG tablet Take 10 mg by mouth daily.   Yes [provider]  Docusate Calcium (STOOL SOFTENER PO) Take 2 tablets by mouth daily as needed (constipation).   Yes [provider]  FLUoxetine (PROZAC) 20 MG capsule Take 20 mg by mouth daily.   Yes [provider]  fluticasone (FLONASE) 50 MCG/ACT nasal spray Place 1 spray into the nose 2 (two) times daily.    Yes [provider]  furosemide (LASIX) 40 MG tablet Take 40 mg by mouth daily.   Yes [provider]  gabapentin (NEURONTIN) 100 MG capsule Take 100 mg by mouth 3 (three) times daily.   Yes [provider]  ibuprofen (ADVIL,MOTRIN) 200 MG tablet Take 200 mg by mouth daily as needed for headache or mild pain.    Yes [provider]  losartan (COZAAR) 50 MG tablet Take 50 mg by mouth daily.   Yes [provider]  montelukast (SINGULAIR) 10 MG tablet Take 10 mg by mouth at bedtime.   Yes [provider]  Multiple Vitamin (MULTIVITAMIN) capsule Take 1 capsule by mouth daily. 02/21/15  Yes Lars Pinks M, PA-C  Olopatadine HCl  (PATANASE) 0.6 % SOLN Place 1 spray into the nose daily.    Yes [provider]    Physical Exam: Vitals:   05/04/18 2230 05/04/18 2330 05/05/18 0000 05/05/18 0030  BP: (!) 149/80 132/65 130/66 130/64  Pulse: 96 94 93 91  Resp: 15 (!) 23 (!) 22 (!) 25  Temp:      TempSrc:      SpO2: 94% 96% 97% 97%  Weight:      Height:          Constitutional: NAD, calm  Eyes: PERTLA, lids and conjunctivae normal ENMT: Mucous membranes are moist. Posterior pharynx clear of any exudate or lesions.   Neck: normal, supple, no masses, no thyromegaly Respiratory:  Rales and rhonchi bilaterally, Rt >> Lt. No wheezing. No accessory muscle use.  Cardiovascular: S1 & S2 heard, regular rate and rhythm. Pretibial pitting edema bilaterally. Abdomen: No distension, no tenderness, soft. Bowel sounds normal.  Musculoskeletal: no clubbing / cyanosis. No joint deformity upper and lower extremities.   Skin: no significant rashes, lesions, ulcers. Warm, dry, well-perfused. Neurologic: CN 2-12 grossly intact. Sensation intact. Strength 5/5 in all 4 limbs.  Psychiatric:  Alert and oriented x 3. Calm, cooperative.     Labs on Admission: I have personally reviewed following labs and imaging studies  CBC: Recent Labs  Lab 05/04/18 2031  WBC 19.7*  NEUTROABS 17.7*  HGB 11.2*  HCT 34.7*  MCV 93.8  PLT 564*   Basic Metabolic Panel: Recent Labs  Lab 05/04/18 2031  NA 139  K 3.2*  CL 92*  CO2 34*  GLUCOSE 271*  BUN 25*  CREATININE 0.94  CALCIUM 9.4   GFR: Estimated Creatinine Clearance: 41.4 mL/min (by C-G formula based on SCr of 0.94 mg/dL). Liver Function Tests: Recent Labs  Lab 05/04/18 2031  AST 37  ALT 47  ALKPHOS 146*  BILITOT 0.4  PROT 6.4*  ALBUMIN 2.3*   No results for input(s): LIPASE, AMYLASE in the last 168 hours. No results for input(s): AMMONIA in the last 168 hours. Coagulation Profile: No results for input(s): INR, PROTIME in the last 168 hours. Cardiac  Enzymes: No results for input(s): CKTOTAL, CKMB, CKMBINDEX, TROPONINI in the last 168 hours. BNP (last 3 results) No results for input(s): PROBNP in the last 8760 hours. HbA1C: No results for  input(s): HGBA1C in the last 72 hours. CBG: No results for input(s): GLUCAP in the last 168 hours. Lipid Profile: No results for input(s): CHOL, HDL, LDLCALC, TRIG, CHOLHDL, LDLDIRECT in the last 72 hours. Thyroid Function Tests: No results for input(s): TSH, T4TOTAL, FREET4, T3FREE, THYROIDAB in the last 72 hours. Anemia Panel: No results for input(s): VITAMINB12, FOLATE, FERRITIN, TIBC, IRON, RETICCTPCT in the last 72 hours. Urine analysis:    Component Value Date/Time   COLORURINE YELLOW 08/23/2016 [place] 08/23/2016 1218   LABSPEC 1.008 08/23/2016 1218   PHURINE 6.5 08/23/2016 [place] 08/23/2016 [place] 08/23/2016 [place] 08/23/2016 [place] 08/23/2016 1218   PROTEINUR NEGATIVE 08/23/2016 1218   UROBILINOGEN 0.2 01/11/2015 0906   NITRITE NEGATIVE 08/23/2016 1218   LEUKOCYTESUR NEGATIVE 08/23/2016 1218   Sepsis Labs: @LABRCNTIP (procalcitonin:4,lacticidven:4) )No results found for this or any previous visit (from the past 240 hour(s)).   Radiological Exams on Admission: Dg Chest Port 1 View  Result Date: 05/04/2018 CLINICAL DATA:  Cough and dyspnea EXAM: PORTABLE CHEST 1 VIEW COMPARISON:  CXR 04/08/2018 and CT 04/13/2018 FINDINGS: Stable cardiomegaly with aortic atherosclerosis. Aortic valvular replacement with median sternotomy sutures are noted. Left-sided ICD device with leads in the right atrium, coronary sinus and right ventricle as before. Edema and/or thickening along the right minor fissure. Coarsened interstitial lung markings with slightly more confluent airspace opacities in the right upper and middle lobes may reflect evolving pneumonia or postobstructive change. Right hilar adenopathy as seen  on prior study is less apparent possibly from superimposition of the cardiac silhouette over the hila. No aggressive osseous abnormality. IMPRESSION: 1. New subpleural areas of airspace opacity in the right upper and middle lobe distribution with coarsened interstitial lung markings. Right upper and middle lobe pneumonia or evolving postobstructive change from known mediastinal and hilar adenopathy are possibilities. Follow-up in 4-6 weeks after treating for pneumonia is recommended. 2. Stable cardiomegaly with aortic atherosclerosis. 3. Status post aortic valvular replacement.  ICD device in place. Electronically Signed   By: Ashley Royalty M.D.   On: 05/04/2018 20:45    EKG: Independently reviewed. Sinus rhythm, LVH with IVCD and repolarization abnormality.   Assessment/Plan   1. Pneumonia; acute hypoxic respiratory failure  - Presents with productive cough and SOB, found to have leukocytosis, new supplemental O2-requirement, and CXR-findings concerning for a postobstructive PNA  - Blood cultures collected in ED and empiric Rocephin and azithromycin started  - Check sputum cultures and strep pneumo antigen   - Continue current abx and supplemental O2 while following cultures and clinical course    2. Adenopathy; bilateral adrenal masses  - Outpatient CT from last month reveals widespread adenopathy and bilateral adrenal masses concerning for metastatic disease  - She has not yet followed-up with ordering physician but findings discussed on admission    3. COPD  - No wheezing on admission  - Continue prn albuterol and supplemental O2   4. Acute on chronic diastolic CHF  - Appears hypervolemic on admission  - SLIV, continue Coreg and losartan - Convert Lasix to IV and start with 40 mg q12h   5. Hyperglycemia  - Serum glucose is 271 on admission  - A1c was 6.2% remotely  - Check CBG's and use SSI with Novolog     DVT prophylaxis: Lovenox Code Status: Full  Family Communication:  Discussed with patient Consults called: None Admission status: Inpatient    Christia Reading  Criss Rosales, MD [facility] Pager [phone]  If 7PM-7AM, please contact night-coverage [email or web address] Password [email or web address]  05/05/2018, 1:12 AM

## 2018-05-05 NOTE — Progress Notes (Signed)
MD paged about patient needing nicotine patch. Patient smokes half a pack a day.

## 2018-05-05 NOTE — Progress Notes (Signed)
Patient seen and examined, admitted earlier this morning by Dr. Christia Reading Opyd, pls see his excellent H&P for details -Carol Warren is a 72 year old female with history of COPD, ongoing tobacco abuse, chronic diastolic and prior systolic CHF admitted with productive cough, fevers and chills and shortness of breath. -Of note, she had an outpatient CT scan done by her PCP last month in [place], Dr.Plotnov which was concerning for widespread chest adenopathy and bilateral adrenal nodules concerning for possible advanced lung cancer. She was referred to pulmonary Dr. Melvyn Novas and has of upcoming appointment soon, will likely end up needing an outpatient PET scan followed by a biopsy.  1.pneumonia -Community acquired versus postobstructive -Continue ceftriaxone and azithromycin -follow up blood and sputum cultures  2. COPD exacerbation/ongoing tobacco abuse -Wheezing this morning, add IV Solu-Medrol and scheduled nebs  3. Abnormal recent CT chest -Patient was told by PCP about concern for possible lung cancer based on this -Due to follow-up with Dr. Melvyn Novas soon, will likely end up with a PET scan prior to biopsy -Advised patient of the importance to keep this appointment  4. Acute on chronic diastolic CHF -appears volume overloaded -Continue IV Lasix today -Monitor creatinine closely  Domenic Polite, MD

## 2018-05-06 ENCOUNTER — Inpatient Hospital Stay (HOSPITAL_COMMUNITY): Payer: Medicare Other

## 2018-05-06 DIAGNOSIS — J181 Lobar pneumonia, unspecified organism: Principal | ICD-10-CM

## 2018-05-06 DIAGNOSIS — J449 Chronic obstructive pulmonary disease, unspecified: Secondary | ICD-10-CM

## 2018-05-06 DIAGNOSIS — J189 Pneumonia, unspecified organism: Secondary | ICD-10-CM

## 2018-05-06 DIAGNOSIS — I5032 Chronic diastolic (congestive) heart failure: Secondary | ICD-10-CM

## 2018-05-06 DIAGNOSIS — R918 Other nonspecific abnormal finding of lung field: Secondary | ICD-10-CM

## 2018-05-06 DIAGNOSIS — J441 Chronic obstructive pulmonary disease with (acute) exacerbation: Secondary | ICD-10-CM

## 2018-05-06 LAB — CBC
HCT: 32.1 % — ABNORMAL LOW (ref 36.0–46.0)
HEMOGLOBIN: 10.1 g/dL — AB (ref 12.0–15.0)
MCH: 30.5 pg (ref 26.0–34.0)
MCHC: 31.5 g/dL (ref 30.0–36.0)
MCV: 97 fL (ref 78.0–100.0)
PLATELETS: 406 10*3/uL — AB (ref 150–400)
RBC: 3.31 MIL/uL — ABNORMAL LOW (ref 3.87–5.11)
RDW: 14.9 % (ref 11.5–15.5)
WBC: 18.4 10*3/uL — ABNORMAL HIGH (ref 4.0–10.5)

## 2018-05-06 LAB — GLUCOSE, CAPILLARY
GLUCOSE-CAPILLARY: 293 mg/dL — AB (ref 65–99)
GLUCOSE-CAPILLARY: 308 mg/dL — AB (ref 65–99)
Glucose-Capillary: 179 mg/dL — ABNORMAL HIGH (ref 65–99)
Glucose-Capillary: 273 mg/dL — ABNORMAL HIGH (ref 65–99)

## 2018-05-06 LAB — BASIC METABOLIC PANEL
Anion gap: 11 (ref 5–15)
BUN: 16 mg/dL (ref 6–20)
CALCIUM: 8.3 mg/dL — AB (ref 8.9–10.3)
CHLORIDE: 94 mmol/L — AB (ref 101–111)
CO2: 39 mmol/L — ABNORMAL HIGH (ref 22–32)
Creatinine, Ser: 0.79 mg/dL (ref 0.44–1.00)
Glucose, Bld: 281 mg/dL — ABNORMAL HIGH (ref 65–99)
Potassium: 2.9 mmol/L — ABNORMAL LOW (ref 3.5–5.1)
SODIUM: 144 mmol/L (ref 135–145)

## 2018-05-06 LAB — HEMOGLOBIN A1C
Hgb A1c MFr Bld: 8.9 % — ABNORMAL HIGH (ref 4.8–5.6)
Mean Plasma Glucose: 208.73 mg/dL

## 2018-05-06 MED ORDER — INSULIN GLARGINE 100 UNIT/ML ~~LOC~~ SOLN
8.0000 [IU] | Freq: Every day | SUBCUTANEOUS | Status: DC
  Administered 2018-05-06 – 2018-05-07 (×2): 8 [IU] via SUBCUTANEOUS
  Filled 2018-05-06 (×2): qty 0.08

## 2018-05-06 MED ORDER — GUAIFENESIN-CODEINE 100-10 MG/5ML PO SOLN
5.0000 mL | Freq: Four times a day (QID) | ORAL | Status: DC | PRN
  Administered 2018-05-06: 5 mL via ORAL
  Filled 2018-05-06: qty 5

## 2018-05-06 MED ORDER — BENZONATATE 100 MG PO CAPS
100.0000 mg | ORAL_CAPSULE | Freq: Three times a day (TID) | ORAL | Status: DC
  Administered 2018-05-06 – 2018-05-13 (×23): 100 mg via ORAL
  Filled 2018-05-06 (×24): qty 1

## 2018-05-06 MED ORDER — INSULIN ASPART 100 UNIT/ML ~~LOC~~ SOLN
0.0000 [IU] | Freq: Three times a day (TID) | SUBCUTANEOUS | Status: DC
Start: 2018-05-06 — End: 2018-05-06

## 2018-05-06 MED ORDER — GUAIFENESIN ER 600 MG PO TB12
600.0000 mg | ORAL_TABLET | Freq: Two times a day (BID) | ORAL | Status: DC
  Administered 2018-05-06 – 2018-05-14 (×17): 600 mg via ORAL
  Filled 2018-05-06 (×17): qty 1

## 2018-05-06 MED ORDER — IPRATROPIUM-ALBUTEROL 0.5-2.5 (3) MG/3ML IN SOLN
3.0000 mL | Freq: Four times a day (QID) | RESPIRATORY_TRACT | Status: DC
Start: 2018-05-06 — End: 2018-05-07
  Administered 2018-05-06 – 2018-05-07 (×3): 3 mL via RESPIRATORY_TRACT
  Filled 2018-05-06 (×3): qty 3

## 2018-05-06 MED ORDER — ENOXAPARIN SODIUM 40 MG/0.4ML ~~LOC~~ SOLN
40.0000 mg | SUBCUTANEOUS | Status: DC
  Administered 2018-05-07 – 2018-05-14 (×8): 40 mg via SUBCUTANEOUS
  Filled 2018-05-06 (×8): qty 0.4

## 2018-05-06 MED ORDER — INSULIN ASPART 100 UNIT/ML ~~LOC~~ SOLN
0.0000 [IU] | Freq: Every day | SUBCUTANEOUS | Status: DC
Start: 2018-05-06 — End: 2018-05-06

## 2018-05-06 NOTE — Consult Note (Signed)
Name: Carol Warren MRN: [id number] DOB: [birth year]    ADMISSION DATE:  05/04/2018 CONSULTATION DATE:  5/8  REFERRING MD :  [facility])   CHIEF COMPLAINT:  Cough, dyspnea, abnormal CT   BRIEF PATIENT DESCRIPTION: 72yo female active smoker with hx COPD, sCHF, CAD, HTN, pernicious anemia with abnormal chest CT.  She had outpt CT chest done last month by her PCP in [place] which revealed mediastinal and hilar lymphadenopathy as well as bilateral adrenal nodules highly concerning for primary lung ca.  She was scheduled for outpt pulmonary follow up.  However, prior to outpt pulm w/u she presented 5/6 to [facility] ER with c/o worsening SOB, cough, fevers, chills.  She was admitted by [facility] with CAP v post obstructive PNA and PCCM asked to see while inpatient.   SIGNIFICANT EVENTS    STUDIES:  04/13/18 CT chest>>>  Interval development of large R hilar, subcarinal, precarinal, R paratracheal and bilateral axillary adenopathy consistent with metastatic disease.  Interval development of bilateral adrenal masses concerning for metastatic disease as well.   CXR 5/6>>> 1. New subpleural areas of airspace opacity in the right upper and middle lobe distribution with coarsened interstitial lung markings. Right upper and middle lobe pneumonia or evolving postobstructive change from known mediastinal and hilar adenopathy are possibilities. Follow-up in 4-6 weeks after treating for pneumonia is recommended. 2. Stable cardiomegaly with aortic atherosclerosis. 3. Status post aortic valvular replacement.  ICD device in place.   HISTORY OF PRESENT ILLNESS: 72yo female active smoker with hx COPD, sCHF, CAD, HTN, pernicious anemia with abnormal chest CT.  She had outpt CT chest done last month by her PCP in [place] which revealed mediastinal and hilar lymphadenopathy as well as bilateral adrenal nodules highly concerning for primary lung ca.  She was scheduled for outpt pulmonary follow up.  However, prior to outpt  pulm w/u she presented 5/6 to [facility] ER with c/o worsening SOB, cough, fevers, chills.  She was admitted by [facility] with CAP v post obstructive PNA and PCCM asked to see while inpatient.   Currently feeling a bit better than she did on admit but not as good as yesterday.  More SOB, more congested, productive cough with white/yellow sputum.  Granddaughter at bedside says that pt is SOB at baseline, gets SOB walking from one room to the other at home.  [employer] shop or get out of the house much r/t SOB.  Does not wear O2 at home but granddaughter thinks she has been told she should.   Actively smoking ~1ppd.  40 pack year hx.   Denies chest pain, weight loss, hemoptysis.   PAST MEDICAL HISTORY :   has a past medical history of Adrenal tumor, AICD (automatic cardioverter/defibrillator) present, Anxiety, Aortic aneurysm ([place]), Asthma, Bicuspid aortic valve, Cancer ([place]), Carpal tunnel syndrome, bilateral, Chronic depression, Chronic sinusitis, CIS (carcinoma in situ) (04/1997), COPD (chronic obstructive pulmonary disease) ([place]), Coronary artery disease, Degenerative disc disease, GERD (gastroesophageal reflux disease), HSV-1 (herpes simplex virus 1) infection, HSV-2 (herpes simplex virus 2) infection, Hypertension, Insomnia, Lumbar stenosis, Osteoarthritis, Osteopenia, Pernicious anemia, Plantar fasciitis, Smoker, Venous insufficiency of left leg, and Vitamin B 12 deficiency.  has a past surgical history that includes EXCISION OF VULVAR CIS; SINUS PROCEDURE (2009); EXCISION OF BASAL CELL CA; Cataract extraction (Bilateral); Carpal tunnel release (Bilateral, 2008); Bunionectomy with hammertoe reconstruction (Right); Colonoscopy; Upper gastrointestinal endoscopy; TEE without cardioversion (N/A, 10/06/2014); left and right heart catheterization with coronary angiogram (N/A, 09/23/2014); Aortic valve replacement (N/A, 01/12/2015); Ascending  aortic root replacement (N/A, 01/12/2015); TEE without cardioversion (N/A,  01/12/2015); bi-ventricular pacemaker insertion (N/A, 01/18/2015); Cyst excision; Functional endoscopic sinus surgery; Multiple tooth extractions; Eye surgery; Cataract extraction w/ intraocular lens  implant, bilateral; and Mass excision (Right, 04/03/2017). Prior to Admission medications   Medication Sig Start Date End Date Taking? Authorizing Provider  albuterol (PROVENTIL HFA;VENTOLIN HFA) 108 (90 BASE) MCG/ACT inhaler Inhale 1-2 puffs into the lungs every 6 (six) hours as needed for wheezing or shortness of breath (depends on congestion if 1-2 puffs).    Yes [provider]  albuterol (PROVENTIL) (2.5 MG/3ML) 0.083% nebulizer solution Take 2.5 mg by nebulization 2 (two) times daily as needed for wheezing.   Yes [provider]  amoxicillin-clavulanate (AUGMENTIN) 875-125 MG tablet Take 1 tablet by mouth 2 (two) times daily.   Yes [provider]  Artificial Tear Solution (GENTEAL TEARS OP) Apply 1 drop to eye 3 (three) times daily as needed (dry eyes).   Yes [provider]  aspirin EC 325 MG EC tablet Take 1 tablet (325 mg total) by mouth daily. 01/20/15  Yes Lars Pinks M, PA-C  Calcium Carbonate-Vitamin D (CALCIUM-D PO) Take 1 tablet by mouth daily.    Yes [provider]  carboxymethylcellulose (REFRESH PLUS) 0.5 % SOLN Place 1 drop into both eyes as needed (dry eyes).   Yes [provider]  carvedilol (COREG) 12.5 MG tablet TAKE 1 TABLET BY MOUTH TWICE DAILY WITH A MEAL 04/30/18  Yes Evans Lance, MD  carvedilol (COREG) 6.25 MG tablet Take 1 tablet (6.25 mg total) by mouth 2 (two) times daily. To take with 12.5 mg to make 18.75 mg 03/30/18  Yes Minus Breeding, MD  cetirizine (ZYRTEC) 10 MG tablet Take 10 mg by mouth daily.   Yes [provider]  Docusate Calcium (STOOL SOFTENER PO) Take 2 tablets by mouth daily as needed (constipation).   Yes [provider]  FLUoxetine (PROZAC) 20 MG capsule Take 20 mg by mouth daily.    Yes [provider]  fluticasone (FLONASE) 50 MCG/ACT nasal spray Place 1 spray into the nose 2 (two) times daily.    Yes [provider]  furosemide (LASIX) 40 MG tablet Take 40 mg by mouth daily.   Yes [provider]  gabapentin (NEURONTIN) 100 MG capsule Take 100 mg by mouth 3 (three) times daily.   Yes [provider]  ibuprofen (ADVIL,MOTRIN) 200 MG tablet Take 200 mg by mouth daily as needed for headache or mild pain.    Yes [provider]  losartan (COZAAR) 50 MG tablet Take 50 mg by mouth daily.   Yes [provider]  montelukast (SINGULAIR) 10 MG tablet Take 10 mg by mouth at bedtime.   Yes [provider]  Multiple Vitamin (MULTIVITAMIN) capsule Take 1 capsule by mouth daily. 02/21/15  Yes Lars Pinks M, PA-C  Olopatadine HCl (PATANASE) 0.6 % SOLN Place 1 spray into the nose daily.    Yes [provider]   No Known Allergies  FAMILY HISTORY:  family history includes Asthma in her grandchild; Breast cancer in her sister; Cancer in her maternal grandfather; Diabetes in her father and maternal grandmother; Diverticulosis in her paternal grandmother; Heart disease in her paternal grandmother; Heart disease (age of onset: 45) in her father; Hypertension in her father, maternal grandmother, and paternal grandmother; Pancreatic cancer (age of onset: 84) in her mother. SOCIAL HISTORY:  reports that she has been smoking cigarettes.  She has a 25.00  pack-year smoking history. She has never used smokeless tobacco. She reports that she does not drink alcohol or use drugs.  REVIEW OF SYSTEMS:   As per HPI - All other systems reviewed and were neg.    SUBJECTIVE:   VITAL SIGNS: Temp:  [97.5 F (36.4 C)-98.2 F (36.8 C)] 97.5 F (36.4 C) (05/08 0524) Pulse Rate:  [89-96] 96 (05/08 0857) Resp:  [24] 24 (05/08 0524) BP: (123-152)/(71-89) 152/89 (05/08 0857) SpO2:  [91 %-93 %] 91 % (05/08 0524) Weight:  [55.4  kg (122 lb 2.2 oz)] 55.4 kg (122 lb 2.2 oz) (05/08 0524)  PHYSICAL EXAMINATION: General:  Chronically ill appearing female, NAD but obviously SOB  Neuro:  Awake, alert, appropriate, MAE  HEENT:  Mm moist, no JVD, palpable LA  Cardiovascular:  s1s2 rrr Lungs:  resps even, dyspneic on [place], does not speak in full sentences, no distress, no sig increased WOB, coarse R>L Abdomen:  Soft, non tender, +bs  Musculoskeletal:  Warm and dry, 2+ BLE edema   Recent Labs  Lab 05/04/18 2031 05/05/18 0244 05/06/18 0458  NA 139 140 144  K 3.2* 3.2* 2.9*  CL 92* 93* 94*  CO2 34* 35* 39*  BUN 25* 21* 16  CREATININE 0.94 0.82 0.79  GLUCOSE 271* 268* 281*   Recent Labs  Lab 05/04/18 2031 05/05/18 0244 05/06/18 0458  HGB 11.2* 10.2* 10.1*  HCT 34.7* 31.7* 32.1*  WBC 19.7* 20.4* 18.4*  PLT 431* 431* 406*   Dg Chest Port 1 View  Result Date: 05/04/2018 CLINICAL DATA:  Cough and dyspnea EXAM: PORTABLE CHEST 1 VIEW COMPARISON:  CXR 04/08/2018 and CT 04/13/2018 FINDINGS: Stable cardiomegaly with aortic atherosclerosis. Aortic valvular replacement with median sternotomy sutures are noted. Left-sided ICD device with leads in the right atrium, coronary sinus and right ventricle as before. Edema and/or thickening along the right minor fissure. Coarsened interstitial lung markings with slightly more confluent airspace opacities in the right upper and middle lobes may reflect evolving pneumonia or postobstructive change. Right hilar adenopathy as seen on prior study is less apparent possibly from superimposition of the cardiac silhouette over the hila. No aggressive osseous abnormality. IMPRESSION: 1. New subpleural areas of airspace opacity in the right upper and middle lobe distribution with coarsened interstitial lung markings. Right upper and middle lobe pneumonia or evolving postobstructive change from known mediastinal and hilar adenopathy are possibilities. Follow-up in 4-6 weeks after treating for pneumonia  is recommended. 2. Stable cardiomegaly with aortic atherosclerosis. 3. Status post aortic valvular replacement.  ICD device in place. Electronically Signed   By: Ashley Royalty M.D.   On: 05/04/2018 20:45    ASSESSMENT / PLAN:  PNA - CAP v post obstructive PNA  Abnormal CT chest -- large R hilar, subcarinal, precarinal, R paratracheal and bilateral axillary adenopathy consistent with metastatic disease.   AECOPD  Tobacco abuse   REC -  Eventually needs EBUS for tissue dx.  However her respiratory status is not ideal, she remains much more SOB than baseline.  Would continue abx for PNA, steroids and BD for AECOPD and optimize volume status prior to consideration of general anesthesia for bx.  Best plan may be to continue current treatment and discharge home with plans for PET scan then re-evaluate as outpt for bx plan.   Discussed at length with pt and her granddaughter at bedside.   Nickolas Madrid, NP 05/06/2018  2:24 PM Pager: [phone] or [phone]

## 2018-05-06 NOTE — Progress Notes (Signed)
Pt has unsafe defibrillator for MRI, RN notified Model: [place], [id number].

## 2018-05-06 NOTE — Progress Notes (Signed)
Inpatient Diabetes Program Recommendations  AACE/ADA: New Consensus Statement on Inpatient Glycemic Control (2015)  Target Ranges:  Prepandial:   less than 140 mg/dL      Peak postprandial:   less than 180 mg/dL (1-2 hours)      Critically ill patients:  140 - 180 mg/dL   Lab Results  Component Value Date   GLUCAP 293 (H) 05/06/2018   HGBA1C 6.2 (H) 01/11/2015    Review of Glycemic Control  Results for MORNA, FLUD (MRN [id number]) as of 05/06/2018 10:57  Ref. Range 05/05/2018 21:24 05/05/2018 23:26 05/06/2018 08:16  Glucose-Capillary Latest Ref Range: 65 - 99 mg/dL 225 (H) 198 (H) 293 (H)    Diabetes history: NO Outpatient Diabetes medications: NA Current orders for Inpatient glycemic control: Novolog 0-9 units TID with meals, Novolog 0-5 units QHS  Inpatient Diabetes Program Recommendations: Please consider ordering an A1C to evaluate glycemic control over the past 2-3 months. Last A1C was 6.2% but was from 2016.  AM FSBS was 293 mg/dL, consider adding Lantus 8 units QD. May need to adjust when steroids are tapered.  If patient begins to consume >50% of meals, consider adding Novolog 3 units TID.  Thanks, Bronson Curb, MSN, RNC-OB Diabetes Coordinator [phone] (8a-5p)

## 2018-05-06 NOTE — Progress Notes (Addendum)
[facility]                                                                              Patient Demographics  Carol Warren, is a 72 y.o. female, DOB - [birth year], OJJ:[id number]  Admit date - 05/04/2018   Admitting Physician Vianne Bulls, MD  Outpatient Primary MD for the patient is Ernestene Kiel, MD  Outpatient specialists:   LOS - 1  days   Medical records reviewed and are as summarized below:    Chief Complaint  Patient presents with  . Shortness of Breath       Brief summary   [name] is a 72 year old female with history of COPD, ongoing tobacco abuse, chronic diastolic and prior systolic CHF admitted with productive cough, fevers and chills and shortness of breath. Of note, she had an outpatient CT scan done by her PCP last month in [place], Dr.Plotnov which was concerning for widespread chest adenopathy and bilateral adrenal nodules concerning for possible advanced lung cancer. She was referred to pulmonary Dr. Melvyn Novas and has of upcoming appointment soon on 5/9  Assessment & Plan    Principal Problem:   Community acquired pneumonia versus postobstructive -Continue ceftriaxone, Zithromax, -Blood cultures negative so far  Active Problems:   COPD Exacerbation GOLD 0/ still smoking  -Continue IV steroids, scheduled nebs, O2  Abnormal recent CT chest concerning for metastatic disease -Patient CT performed last month per admission H&P, showed adenopathy in the chest, bilateral axilla, bilateral adrenal masses concerning for metastatic disease -Patient was referred to Dr. Melvyn Novas, pulmonology, appointment scheduled on 5/9, discussed with pulmonology, Dr. Halford Chessman, will follow patient for further recommendations.    Acute on chronic diastolic CHF (congestive heart failure) ([place]) 2D echo 07/2017 showed 93%, grade 2 diastolic dysfunction -Continue IV Lasix  -Continue strict I's and O's and daily weights     Hyperglycemia Follow hemoglobin A1c, placed on  Lantus 8 units, continue sliding scale insulin  Acute metabolic encephalopathy: -d/w pt's daughter in law who reported that she noticed the patient to be confused and slurring her speech on Monday. She feels that patient is not at her baseline still. - obtain MRI brain, r/o CVA or any mets (lung mass with mets on CT)   Code Status: Full CODE STATUS DVT Prophylaxis:  Lovenox Family Communication: Discussed in detail with the patient, all imaging results, lab results explained to the patient and daughter in law    Disposition Plan: When medically clear  Time Spent in minutes 35 minutes  Procedures:  None  Consultants:   Pulmonology  Antimicrobials:      Medications  Scheduled Meds: . aspirin  325 mg Oral Daily  . benzonatate  100 mg Oral TID  . carvedilol  18.75 mg Oral BID WC  . [START ON 05/07/2018] enoxaparin (LOVENOX) injection  40 mg Subcutaneous Q24H  . FLUoxetine  20 mg Oral Daily  . furosemide  40 mg Intravenous BID  . gabapentin  100 mg Oral TID  . guaiFENesin  600 mg Oral BID  . insulin aspart  0-5 Units Subcutaneous QHS  . insulin aspart  0-9 Units Subcutaneous TID WC  .  insulin glargine  8 Units Subcutaneous QHS  . losartan  50 mg Oral Daily  . methylPREDNISolone (SOLU-MEDROL) injection  60 mg Intravenous Q6H  . montelukast  10 mg Oral QHS  . multivitamin with minerals  1 tablet Oral Daily  . sodium chloride flush  3 mL Intravenous Q12H  . sodium chloride flush  3 mL Intravenous Q12H   Continuous Infusions: . sodium chloride    . azithromycin Stopped (05/06/18 0032)  . cefTRIAXone (ROCEPHIN)  IV Stopped (05/06/18 0002)   PRN Meds:.sodium chloride, acetaminophen **OR** acetaminophen, albuterol, bisacodyl, guaiFENesin-codeine, HYDROcodone-acetaminophen, ondansetron **OR** ondansetron (ZOFRAN) IV, polyvinyl alcohol, senna-docusate, sodium chloride flush   Antibiotics   Anti-infectives (From admission, onward)   Start     Dose/Rate Route Frequency Ordered  Stop   05/05/18 2200  cefTRIAXone (ROCEPHIN) 1 g in sodium chloride 0.9 % 100 mL IVPB     1 g 200 mL/hr over 30 Minutes Intravenous Every 24 hours 05/05/18 0111 05/11/18 2159   05/05/18 2200  azithromycin (ZITHROMAX) 500 mg in sodium chloride 0.9 % 250 mL IVPB     500 mg 250 mL/hr over 60 Minutes Intravenous Every 24 hours 05/05/18 0111 05/11/18 2159   05/04/18 2215  cefTRIAXone (ROCEPHIN) 1 g in sodium chloride 0.9 % 100 mL IVPB     1 g 200 mL/hr over 30 Minutes Intravenous  Once 05/04/18 2213 05/05/18 0007   05/04/18 2215  azithromycin (ZITHROMAX) tablet 500 mg     500 mg Oral  Once 05/04/18 2213 05/04/18 2316        Subjective:   Carol Warren was seen and examined today.  No chest pain.  No fevers or chills patient denies dizziness, abdominal pain, N/V/D/C, new weakness, numbess, tingling. No acute events overnight.    Objective:   Vitals:   05/05/18 1741 05/05/18 2122 05/06/18 0524 05/06/18 0857  BP: 130/75 123/71 (!) 141/75 (!) 152/89  Pulse: 90 90 89 96  Resp:  (!) 24 (!) 24   Temp:  98.2 F (36.8 C) (!) 97.5 F (36.4 C)   TempSrc:      SpO2:  93% 91%   Weight:   55.4 kg (122 lb 2.2 oz)   Height:        Intake/Output Summary (Last 24 hours) at 05/06/2018 1324 Last data filed at 05/06/2018 1043 Gross per 24 hour  Intake 480 ml  Output 501 ml  Net -21 ml     Wt Readings from Last 3 Encounters:  05/06/18 55.4 kg (122 lb 2.2 oz)  03/19/18 56.2 kg (124 lb)  12/03/17 56.2 kg (124 lb)     Exam  General: Alert and oriented x 3, NAD  Eyes:   HEENT:    Cardiovascular: S1 S2 auscultated, Regular rate and rhythm.  Respiratory: bilateral rhonchi  Gastrointestinal: Soft, nontender, nondistended, + bowel sounds  Ext: 1 + pedal edema bilaterally  Neuro: no new deficit  Musculoskeletal: No digital cyanosis, clubbing  Skin: No rashes  Psych: Normal affect and demeanor, alert and oriented x3    Data Reviewed:  I have personally reviewed following labs and  imaging studies  Micro Results Recent Results (from the past 240 hour(s))  Blood Culture (routine x 2)     Status: None (Preliminary result)   Collection Time: 05/04/18 10:44 PM  Result Value Ref Range Status   Specimen Description BLOOD RIGHT ANTECUBITAL  Final   Special Requests   Final    BOTTLES DRAWN AEROBIC AND ANAEROBIC Blood Culture adequate volume  Culture   Final    NO GROWTH 2 DAYS Performed at [facility], [place] [address] [place], [place] [address]    Report Status PENDING  Incomplete    Radiology Reports Dg Chest Port 1 View  Result Date: 05/04/2018 CLINICAL DATA:  Cough and dyspnea EXAM: PORTABLE CHEST 1 VIEW COMPARISON:  CXR 04/08/2018 and CT 04/13/2018 FINDINGS: Stable cardiomegaly with aortic atherosclerosis. Aortic valvular replacement with median sternotomy sutures are noted. Left-sided ICD device with leads in the right atrium, coronary sinus and right ventricle as before. Edema and/or thickening along the right minor fissure. Coarsened interstitial lung markings with slightly more confluent airspace opacities in the right upper and middle lobes may reflect evolving pneumonia or postobstructive change. Right hilar adenopathy as seen on prior study is less apparent possibly from superimposition of the cardiac silhouette over the hila. No aggressive osseous abnormality. IMPRESSION: 1. New subpleural areas of airspace opacity in the right upper and middle lobe distribution with coarsened interstitial lung markings. Right upper and middle lobe pneumonia or evolving postobstructive change from known mediastinal and hilar adenopathy are possibilities. Follow-up in 4-6 weeks after treating for pneumonia is recommended. 2. Stable cardiomegaly with aortic atherosclerosis. 3. Status post aortic valvular replacement.  ICD device in place. Electronically Signed   By: Ashley Royalty M.D.   On: 05/04/2018 20:45    Lab Data:  CBC: Recent Labs  Lab 05/04/18 2031 05/05/18 0244  05/06/18 0458  WBC 19.7* 20.4* 18.4*  NEUTROABS 17.7* 18.4*  --   HGB 11.2* 10.2* 10.1*  HCT 34.7* 31.7* 32.1*  MCV 93.8 94.9 97.0  PLT 431* 431* 448*   Basic Metabolic Panel: Recent Labs  Lab 05/04/18 2031 05/05/18 0244 05/06/18 0458  NA 139 140 144  K 3.2* 3.2* 2.9*  CL 92* 93* 94*  CO2 34* 35* 39*  GLUCOSE 271* 268* 281*  BUN 25* 21* 16  CREATININE 0.94 0.82 0.79  CALCIUM 9.4 9.0 8.3*  MG  --  2.1  --    GFR: Estimated Creatinine Clearance: 48.7 mL/min (by C-G formula based on SCr of 0.79 mg/dL). Liver Function Tests: Recent Labs  Lab 05/04/18 2031  AST 37  ALT 47  ALKPHOS 146*  BILITOT 0.4  PROT 6.4*  ALBUMIN 2.3*   No results for input(s): LIPASE, AMYLASE in the last 168 hours. No results for input(s): AMMONIA in the last 168 hours. Coagulation Profile: No results for input(s): INR, PROTIME in the last 168 hours. Cardiac Enzymes: No results for input(s): CKTOTAL, CKMB, CKMBINDEX, TROPONINI in the last 168 hours. BNP (last 3 results) No results for input(s): PROBNP in the last 8760 hours. HbA1C: No results for input(s): HGBA1C in the last 72 hours. CBG: Recent Labs  Lab 05/05/18 1701 05/05/18 2124 05/05/18 2326 05/06/18 0816 05/06/18 1233  GLUCAP 318* 225* 198* 293* 308*   Lipid Profile: No results for input(s): CHOL, HDL, LDLCALC, TRIG, CHOLHDL, LDLDIRECT in the last 72 hours. Thyroid Function Tests: No results for input(s): TSH, T4TOTAL, FREET4, T3FREE, THYROIDAB in the last 72 hours. Anemia Panel: No results for input(s): VITAMINB12, FOLATE, FERRITIN, TIBC, IRON, RETICCTPCT in the last 72 hours. Urine analysis:    Component Value Date/Time   COLORURINE YELLOW 05/05/2018 [place] 05/05/2018 0537   LABSPEC 1.014 05/05/2018 0537   PHURINE 6.0 05/05/2018 0537   GLUCOSEU 50 (A) 05/05/2018 0537   HGBUR SMALL (A) 05/05/2018 [place] NEGATIVE 05/05/2018 [place] 05/05/2018 0537  PROTEINUR 30 (A)  05/05/2018 0537   UROBILINOGEN 0.2 01/11/2015 0906   NITRITE NEGATIVE 05/05/2018 0537   LEUKOCYTESUR NEGATIVE 05/05/2018 0537     Jayjay Littles M.D. [facility] 05/06/2018, 1:24 PM  Pager: [phone] Between 7am to 7pm - call Pager - [phone]  After 7pm go to [email or web address] - password [email or web address]  Call night coverage person covering after 7pm

## 2018-05-07 ENCOUNTER — Inpatient Hospital Stay (HOSPITAL_COMMUNITY): Payer: Medicare Other

## 2018-05-07 ENCOUNTER — Institutional Professional Consult (permissible substitution): Payer: Medicare Other | Admitting: Internal Medicine

## 2018-05-07 LAB — BLOOD GAS, ARTERIAL
Acid-Base Excess: 17.3 mmol/L — ABNORMAL HIGH (ref 0.0–2.0)
BICARBONATE: 43.5 mmol/L — AB (ref 20.0–28.0)
Drawn by: 331761
O2 Content: 5 L/min
O2 Saturation: 95.1 %
PCO2 ART: 76 mmHg — AB (ref 32.0–48.0)
PH ART: 7.376 (ref 7.350–7.450)
Patient temperature: 98.6
pO2, Arterial: 77.3 mmHg — ABNORMAL LOW (ref 83.0–108.0)

## 2018-05-07 LAB — GLUCOSE, CAPILLARY
GLUCOSE-CAPILLARY: 196 mg/dL — AB (ref 65–99)
GLUCOSE-CAPILLARY: 215 mg/dL — AB (ref 65–99)
Glucose-Capillary: 230 mg/dL — ABNORMAL HIGH (ref 65–99)

## 2018-05-07 LAB — EXPECTORATED SPUTUM ASSESSMENT W REFEX TO RESP CULTURE

## 2018-05-07 LAB — BASIC METABOLIC PANEL
Anion gap: 11 (ref 5–15)
BUN: 19 mg/dL (ref 6–20)
CALCIUM: 8.3 mg/dL — AB (ref 8.9–10.3)
CHLORIDE: 90 mmol/L — AB (ref 101–111)
CO2: 42 mmol/L — ABNORMAL HIGH (ref 22–32)
Creatinine, Ser: 0.74 mg/dL (ref 0.44–1.00)
GFR calc Af Amer: >60 mL/min (ref >60)
GFR calc non Af Amer: >60 mL/min (ref >60)
Glucose, Bld: 190 mg/dL — ABNORMAL HIGH (ref 65–99)
Potassium: 3 mmol/L — ABNORMAL LOW (ref 3.5–5.1)
SODIUM: 143 mmol/L (ref 135–145)

## 2018-05-07 LAB — CBC
HCT: 30.7 % — ABNORMAL LOW (ref 36.0–46.0)
HEMOGLOBIN: 9.4 g/dL — AB (ref 12.0–15.0)
MCH: 29.7 pg (ref 26.0–34.0)
MCHC: 30.6 g/dL (ref 30.0–36.0)
MCV: 96.8 fL (ref 78.0–100.0)
Platelets: 400 10*3/uL (ref 150–400)
RBC: 3.17 MIL/uL — ABNORMAL LOW (ref 3.87–5.11)
RDW: 14.4 % (ref 11.5–15.5)
WBC: 17.8 10*3/uL — ABNORMAL HIGH (ref 4.0–10.5)

## 2018-05-07 LAB — VITAMIN B12: VITAMIN B 12: 1124 pg/mL — AB (ref 180–914)

## 2018-05-07 LAB — LEGIONELLA PNEUMOPHILA SEROGP 1 UR AG: L. pneumophila Serogp 1 Ur Ag: NEGATIVE

## 2018-05-07 LAB — EXPECTORATED SPUTUM ASSESSMENT W GRAM STAIN, RFLX TO RESP C

## 2018-05-07 LAB — TSH: TSH: <0.01 u[IU]/mL — ABNORMAL LOW (ref 0.350–4.500)

## 2018-05-07 LAB — T4, FREE: Free T4: 1.63 ng/dL (ref 0.82–1.77)

## 2018-05-07 LAB — AMMONIA: AMMONIA: 31 umol/L (ref 9–35)

## 2018-05-07 MED ORDER — METHYLPREDNISOLONE SODIUM SUCC 40 MG IJ SOLR
40.0000 mg | Freq: Two times a day (BID) | INTRAMUSCULAR | Status: DC
  Administered 2018-05-07 – 2018-05-10 (×6): 40 mg via INTRAVENOUS
  Filled 2018-05-07 (×6): qty 1

## 2018-05-07 MED ORDER — POTASSIUM CHLORIDE CRYS ER 20 MEQ PO TBCR
40.0000 meq | EXTENDED_RELEASE_TABLET | Freq: Once | ORAL | Status: AC
  Administered 2018-05-07: 40 meq via ORAL
  Filled 2018-05-07: qty 2

## 2018-05-07 MED ORDER — FUROSEMIDE 40 MG PO TABS
40.0000 mg | ORAL_TABLET | Freq: Every day | ORAL | Status: DC
Start: 2018-05-08 — End: 2018-05-09
  Administered 2018-05-08 – 2018-05-09 (×2): 40 mg via ORAL
  Filled 2018-05-07 (×2): qty 1

## 2018-05-07 MED ORDER — IPRATROPIUM-ALBUTEROL 0.5-2.5 (3) MG/3ML IN SOLN
3.0000 mL | Freq: Three times a day (TID) | RESPIRATORY_TRACT | Status: DC
Start: 2018-05-07 — End: 2018-05-14
  Administered 2018-05-07 – 2018-05-14 (×20): 3 mL via RESPIRATORY_TRACT
  Filled 2018-05-07 (×21): qty 3

## 2018-05-07 MED ORDER — NICOTINE 21 MG/24HR TD PT24
21.0000 mg | MEDICATED_PATCH | Freq: Every day | TRANSDERMAL | Status: DC
  Administered 2018-05-07 – 2018-05-14 (×8): 21 mg via TRANSDERMAL
  Filled 2018-05-07 (×8): qty 1

## 2018-05-07 MED ORDER — GUAIFENESIN-DM 100-10 MG/5ML PO SYRP: 5.0000 mL | ORAL_SOLUTION | ORAL | Status: DC | PRN

## 2018-05-07 MED ORDER — IOHEXOL 300 MG/ML  SOLN
100.0000 mL | Freq: Once | INTRAMUSCULAR | Status: AC | PRN
  Administered 2018-05-07: 80 mL via INTRAVENOUS

## 2018-05-07 MED ORDER — METHYLPREDNISOLONE SODIUM SUCC 125 MG IJ SOLR: 60.0000 mg | Freq: Two times a day (BID) | INTRAMUSCULAR | Status: DC

## 2018-05-07 NOTE — Consult Note (Signed)
   Name: Carol Warren MRN: [id number] DOB: 11/25/1946    ADMISSION DATE:  05/04/2018 CONSULTATION DATE:  5/8  REFERRING MD :  [facility])   CHIEF COMPLAINT:  Cough, dyspnea, abnormal CT   BRIEF PATIENT DESCRIPTION:  72 yo female smoker with dyspnea, cough, fever, chills from PNA, COPD, and lung mass.  PMHx of CAD, HTN, HFpEF, s/p AICD, Depression, GERD, Pernicious anemia.  STUDIES:  04/13/18 CT chest >>>  Interval development of large R hilar, subcarinal, precarinal, R paratracheal and bilateral axillary adenopathy consistent with metastatic disease.  Interval development of bilateral adrenal masses concerning for metastatic disease as well.   SUBJECTIVE:  Cough better.  Denies chest pain.  VITAL SIGNS: BP 136/81 (BP Location: Right Arm)   Pulse 96   Temp 98.2 F (36.8 C) (Oral)   Resp 19   Ht 5\' 1"  (1.549 m)   Wt 122 lb 2.2 oz (55.4 kg)   SpO2 95%   BMI 23.08 kg/m   PHYSICAL EXAMINATION:  General - pleasant Eyes - pupils reactive ENT - no sinus tenderness, no oral exudate, no LAN Cardiac - regular, no murmur Chest - decreased BS, no wheeze Abd - soft, non tender Ext - no edema Skin - no rashes Neuro - normal strength Psych - normal mood  Labs: CBC Recent Labs    05/05/18 0244 05/06/18 0458 05/07/18 0354  WBC 20.4* 18.4* 17.8*  HGB 10.2* 10.1* 9.4*  HCT 31.7* 32.1* 30.7*  PLT 431* 406* 400    Coag's No results for input(s): APTT, INR in the last 72 hours.  BMET Recent Labs    05/05/18 0244 05/06/18 0458 05/07/18 0354  NA 140 144 143  K 3.2* 2.9* 3.0*  CL 93* 94* 90*  CO2 35* 39* 42*  BUN 21* 16 19  CREATININE 0.82 0.79 0.74  GLUCOSE 268* 281* 190*    Electrolytes Recent Labs    05/05/18 0244 05/06/18 0458 05/07/18 0354  CALCIUM 9.0 8.3* 8.3*  MG 2.1  --   --     Sepsis Markers No results for input(s): PROCALCITON, O2SATVEN in the last 72 hours.  Invalid input(s): LACTICACIDVEN  ABG No results for input(s): PHART, PCO2ART, PO2ART in  the last 72 hours.  Liver Enzymes Recent Labs    05/04/18 2031  AST 37  ALT 47  ALKPHOS 146*  BILITOT 0.4  ALBUMIN 2.3*    Cardiac Enzymes No results for input(s): TROPONINI, PROBNP in the last 72 hours.  Glucose Recent Labs    05/06/18 0816 05/06/18 1233 05/06/18 1629 05/06/18 2113 05/07/18 0746 05/07/18 1128  GLUCAP 293* 308* 179* 273* 196* 215*    Imaging No results found.   ASSESSMENT / PLAN:  Post obstructive pneumonia. - continue Abx - f/u CXR  AECOPD. - continue duoneb - wean solumedrol as tolerated  Acute hypoxic respiratory failure. - oxygen to keep SpO2 90 to 95%  Lung mass. - most likely malignant - plan to arrange for PET scan as outpt after she is treated for pneumonia  Updated family at bedside  Chesley Mires, MD [place] 05/07/2018, 11:56 AM

## 2018-05-07 NOTE — Progress Notes (Signed)
Patient attempting to get out of bed many times throughout the night despite education from RN to call for assistance. Pt expressed that she knows to do so but refusing to utilize call bell. Have observed patient ambulating and she is very unsteady and weak even when walking a few steps. Educated patient that due to her noncompliance with safety precautions, I do not feel it is wise for her to be left sitting up in the chair alone at this time. Offered to adjust bed for patient to make her more comfortable, patient refused and requested RN to leave her alone. Placed floor mats around patient's bed for safety and will order low bed.

## 2018-05-07 NOTE — Progress Notes (Addendum)
[facility]                                                                              Patient Demographics  Carol Warren, is a 72 y.o. female, DOB - 1946-09-04, ION:[id number]  Admit date - 05/04/2018   Admitting Physician Vianne Bulls, MD  Outpatient Primary MD for the patient is Ernestene Kiel, MD  Outpatient specialists:   LOS - 2  days   Medical records reviewed and are as summarized below:    Chief Complaint  Patient presents with  . Shortness of Breath       Brief summary   [name] is a 72 year old female with history of COPD, ongoing tobacco abuse, chronic diastolic and prior systolic CHF admitted with productive cough, fevers and chills and shortness of breath. Of note, she had an outpatient CT scan done by her PCP last month in [place], Dr.Plotnov which was concerning for widespread chest adenopathy and bilateral adrenal nodules concerning for possible advanced lung cancer. She was referred to pulmonary Dr. Melvyn Novas and has of upcoming appointment soon on 5/9  Assessment & Plan    Principal Problem:   Community acquired pneumonia versus postobstructive -Continue ceftriaxone, Zithromax, -Blood cultures negative so far  Active Problems:   COPD Exacerbation GOLD 0/ still smoking  -Continue IV steroids, scheduled nebs, O2  Abnormal recent CT chest concerning for metastatic disease -Patient CT performed last month per admission H&P, showed adenopathy in the chest, bilateral axilla, bilateral adrenal masses concerning for metastatic disease -Pulmonology, Dr. Halford Chessman following, appreciate recommendations, outpatient PET/CT and further management  Acute metabolic encephalopathy: -Very confused, unable to obtain MRI due to AICD UA negative for UTI -CT head with and without contrast showed no evidence of acute abnormality or metastatic disease. -will obtain ammonia level, B12, folate, TSH RPR -Obtain ABG to rule out hypercapnia (smoker) -Decrease  Solu-Medrol to 40 mg every 12 hours Addendum:  - ABG reviewed with RT, compensated with bicarb high. Patient alert and oriented, family confirmed close to her baseline.  - Bicarb high could be from from contraction alkalosis from IV diuretics, DC IV lasix and place on oral lasix from 5/10 - RT will monitor closely, if mentation worsens or resp status, will place on Bipap - TSH very low, will check free T4, T3, to assess hyperthyroidism  Acute on chronic diastolic CHF (congestive heart failure) ([place]) 2D echo 07/2017 showed 24%, grade 2 diastolic dysfunction -Continue IV Lasix  -Continue strict I's and O's and daily weights   Diabetes mellitus, type II uncontrolled with hyperglycemia Hemoglobin A1c 8.9, continue Lantus 8 units, continue sliding scale insulin Steroids being reduced, will hopefully improve CBGs  Nicotine abuse - Placed on nicotine patch   Code Status: Full CODE STATUS DVT Prophylaxis:  Lovenox Family Communication: Discussed in detail with the patient, all imaging results, lab results explained to the patient and daughter in law yesterday   Disposition Plan: When medically clear  Time Spent in minutes 35 minutes  Procedures:  None  Consultants:   Pulmonology  Antimicrobials:      Medications  Scheduled Meds: . aspirin  325 mg Oral Daily  .  benzonatate  100 mg Oral TID  . carvedilol  18.75 mg Oral BID WC  . enoxaparin (LOVENOX) injection  40 mg Subcutaneous Q24H  . FLUoxetine  20 mg Oral Daily  . furosemide  40 mg Intravenous BID  . gabapentin  100 mg Oral TID  . guaiFENesin  600 mg Oral BID  . insulin aspart  0-5 Units Subcutaneous QHS  . insulin aspart  0-9 Units Subcutaneous TID WC  . insulin glargine  8 Units Subcutaneous QHS  . ipratropium-albuterol  3 mL Nebulization TID  . losartan  50 mg Oral Daily  . methylPREDNISolone (SOLU-MEDROL) injection  40 mg Intravenous Q12H  . montelukast  10 mg Oral QHS  . multivitamin with minerals  1 tablet  Oral Daily  . sodium chloride flush  3 mL Intravenous Q12H  . sodium chloride flush  3 mL Intravenous Q12H   Continuous Infusions: . sodium chloride    . azithromycin Stopped (05/07/18 0110)  . cefTRIAXone (ROCEPHIN)  IV Stopped (05/06/18 2348)   PRN Meds:.sodium chloride, acetaminophen **OR** acetaminophen, albuterol, bisacodyl, guaiFENesin-dextromethorphan, HYDROcodone-acetaminophen, ondansetron **OR** ondansetron (ZOFRAN) IV, polyvinyl alcohol, senna-docusate, sodium chloride flush   Antibiotics   Anti-infectives (From admission, onward)   Start     Dose/Rate Route Frequency Ordered Stop   05/05/18 2200  cefTRIAXone (ROCEPHIN) 1 g in sodium chloride 0.9 % 100 mL IVPB     1 g 200 mL/hr over 30 Minutes Intravenous Every 24 hours 05/05/18 0111 05/11/18 2159   05/05/18 2200  azithromycin (ZITHROMAX) 500 mg in sodium chloride 0.9 % 250 mL IVPB     500 mg 250 mL/hr over 60 Minutes Intravenous Every 24 hours 05/05/18 0111 05/11/18 2159   05/04/18 2215  cefTRIAXone (ROCEPHIN) 1 g in sodium chloride 0.9 % 100 mL IVPB     1 g 200 mL/hr over 30 Minutes Intravenous  Once 05/04/18 2213 05/05/18 0007   05/04/18 2215  azithromycin (ZITHROMAX) tablet 500 mg     500 mg Oral  Once 05/04/18 2213 05/04/18 2316        Subjective:   Carol Warren was seen and examined today.  Oriented to self, somewhat confused, no fevers or chills.  Denies any chest pain.  Patient denies any dizziness, abdominal pain, N/V/D/C, new weakness, numbess, tingling. No acute events overnight.    Objective:   Vitals:   05/07/18 0500 05/07/18 0855 05/07/18 1316 05/07/18 1459  BP: 136/81  125/73   Pulse: 96  82   Resp: 19  20   Temp: 98.2 F (36.8 C)  98.5 F (36.9 C)   TempSrc: Oral  Oral   SpO2: 97% 95% 97% 95%  Weight:      Height:       No intake or output data in the 24 hours ending 05/07/18 1510   Wt Readings from Last 3 Encounters:  05/06/18 55.4 kg (122 lb 2.2 oz)  03/19/18 56.2 kg (124 lb)  12/03/17  56.2 kg (124 lb)     Exam   General: Alert and oriented to self, NAD, somewhat confused  Eyes:   HEENT:  Atraumatic, normocephalic  Cardiovascular: S1 S2 auscultated, no rubs, murmurs or gallops. Regular rate and rhythm. No pedal edema b/l  Respiratory: Bilateral expiratory wheezing  Gastrointestinal: Soft, nontender, nondistended, + bowel sounds  Ext: no pedal edema bilaterally  Neuro: moving all 4 extremities  Musculoskeletal: No digital cyanosis, clubbing  Skin: No rashes  Psych: confused    Data Reviewed:  I have personally reviewed  following labs and imaging studies  Micro Results Recent Results (from the past 240 hour(s))  Blood Culture (routine x 2)     Status: None (Preliminary result)   Collection Time: 05/04/18 10:44 PM  Result Value Ref Range Status   Specimen Description BLOOD RIGHT ANTECUBITAL  Final   Special Requests   Final    BOTTLES DRAWN AEROBIC AND ANAEROBIC Blood Culture adequate volume   Culture   Final    NO GROWTH 3 DAYS Performed at [facility], [address], [place], [place] [address]    Report Status PENDING  Incomplete  Culture, sputum-assessment     Status: None   Collection Time: 05/07/18  5:07 AM  Result Value Ref Range Status   Specimen Description EXPECTORATED SPUTUM  Final   Special Requests   Final    NONE Performed at [facility], [address], [place], [place] [address]    Sputum evaluation THIS SPECIMEN IS ACCEPTABLE FOR SPUTUM CULTURE  Final   Report Status 05/07/2018 FINAL  Final  Culture, respiratory (NON-Expectorated)     Status: None (Preliminary result)   Collection Time: 05/07/18  5:07 AM  Result Value Ref Range Status   Specimen Description EXPECTORATED SPUTUM  Final   Special Requests NONE Reflexed from [id number]  Final   Gram Stain   Final    FEW WBC PRESENT, PREDOMINANTLY PMN FEW GRAM POSITIVE COCCI RARE GRAM NEGATIVE RODS RARE GRAM POSITIVE RODS    Culture PENDING  Incomplete   Report  Status PENDING  Incomplete    Radiology Reports Ct Head W & Wo Contrast  Result Date: 05/07/2018 CLINICAL DATA:  New onset altered level of consciousness. Lung mass with metastases. Evaluate for brain metastases. EXAM: CT HEAD WITHOUT AND WITH CONTRAST TECHNIQUE: Contiguous axial images were obtained from the base of the skull through the vertex without and with intravenous contrast CONTRAST:  42mL OMNIPAQUE IOHEXOL 300 MG/ML  SOLN COMPARISON:  Sinus CT 04/13/2018.  No prior brain imaging. FINDINGS: Brain: There is no evidence of acute infarct, intracranial hemorrhage, mass, midline shift, or extra-axial fluid collection. Mild cerebral atrophy is not greater than expected for patient's age. No abnormal enhancement is identified. Vascular: Mild calcified atherosclerosis at the skull base. Major dural venous sinuses are grossly patent. Skull: No fracture or suspicious osseous lesion. Sinuses/Orbits: Postsurgical changes in the paranasal sinuses including right maxillary antrostomy. Mild right maxillary sinus mucosal thickening. Clear mastoid air cells. Unremarkable orbits. Other: None. IMPRESSION: Unremarkable CT appearance of the brain for age. No evidence of acute abnormality or metastatic disease. Electronically Signed   By: Logan Bores M.D.   On: 05/07/2018 14:07   Dg Chest Port 1 View  Result Date: 05/04/2018 CLINICAL DATA:  Cough and dyspnea EXAM: PORTABLE CHEST 1 VIEW COMPARISON:  CXR 04/08/2018 and CT 04/13/2018 FINDINGS: Stable cardiomegaly with aortic atherosclerosis. Aortic valvular replacement with median sternotomy sutures are noted. Left-sided ICD device with leads in the right atrium, coronary sinus and right ventricle as before. Edema and/or thickening along the right minor fissure. Coarsened interstitial lung markings with slightly more confluent airspace opacities in the right upper and middle lobes may reflect evolving pneumonia or postobstructive change. Right hilar adenopathy as seen on  prior study is less apparent possibly from superimposition of the cardiac silhouette over the hila. No aggressive osseous abnormality. IMPRESSION: 1. New subpleural areas of airspace opacity in the right upper and middle lobe distribution with coarsened interstitial lung markings. Right upper and middle  lobe pneumonia or evolving postobstructive change from known mediastinal and hilar adenopathy are possibilities. Follow-up in 4-6 weeks after treating for pneumonia is recommended. 2. Stable cardiomegaly with aortic atherosclerosis. 3. Status post aortic valvular replacement.  ICD device in place. Electronically Signed   By: Ashley Royalty M.D.   On: 05/04/2018 20:45    Lab Data:  CBC: Recent Labs  Lab 05/04/18 2031 05/05/18 0244 05/06/18 0458 05/07/18 0354  WBC 19.7* 20.4* 18.4* 17.8*  NEUTROABS 17.7* 18.4*  --   --   HGB 11.2* 10.2* 10.1* 9.4*  HCT 34.7* 31.7* 32.1* 30.7*  MCV 93.8 94.9 97.0 96.8  PLT 431* 431* 406* 734   Basic Metabolic Panel: Recent Labs  Lab 05/04/18 2031 05/05/18 0244 05/06/18 0458 05/07/18 0354  NA 139 140 144 143  K 3.2* 3.2* 2.9* 3.0*  CL 92* 93* 94* 90*  CO2 34* 35* 39* 42*  GLUCOSE 271* 268* 281* 190*  BUN 25* 21* 16 19  CREATININE 0.94 0.82 0.79 0.74  CALCIUM 9.4 9.0 8.3* 8.3*  MG  --  2.1  --   --    GFR: Estimated Creatinine Clearance: 48.7 mL/min (by C-G formula based on SCr of 0.74 mg/dL). Liver Function Tests: Recent Labs  Lab 05/04/18 2031  AST 37  ALT 47  ALKPHOS 146*  BILITOT 0.4  PROT 6.4*  ALBUMIN 2.3*   No results for input(s): LIPASE, AMYLASE in the last 168 hours. No results for input(s): AMMONIA in the last 168 hours. Coagulation Profile: No results for input(s): INR, PROTIME in the last 168 hours. Cardiac Enzymes: No results for input(s): CKTOTAL, CKMB, CKMBINDEX, TROPONINI in the last 168 hours. BNP (last 3 results) No results for input(s): PROBNP in the last 8760 hours. HbA1C: Recent Labs    05/06/18 1345  HGBA1C  8.9*   CBG: Recent Labs  Lab 05/06/18 1233 05/06/18 1629 05/06/18 2113 05/07/18 0746 05/07/18 1128  GLUCAP 308* 179* 273* 196* 215*   Lipid Profile: No results for input(s): CHOL, HDL, LDLCALC, TRIG, CHOLHDL, LDLDIRECT in the last 72 hours. Thyroid Function Tests: No results for input(s): TSH, T4TOTAL, FREET4, T3FREE, THYROIDAB in the last 72 hours. Anemia Panel: No results for input(s): VITAMINB12, FOLATE, FERRITIN, TIBC, IRON, RETICCTPCT in the last 72 hours. Urine analysis:    Component Value Date/Time   COLORURINE YELLOW 05/05/2018 [place] 05/05/2018 0537   LABSPEC 1.014 05/05/2018 0537   PHURINE 6.0 05/05/2018 0537   GLUCOSEU 50 (A) 05/05/2018 0537   HGBUR SMALL (A) 05/05/2018 0537   BILIRUBINUR NEGATIVE 05/05/2018 0537   KETONESUR NEGATIVE 05/05/2018 0537   PROTEINUR 30 (A) 05/05/2018 0537   UROBILINOGEN 0.2 01/11/2015 0906   NITRITE NEGATIVE 05/05/2018 0537   LEUKOCYTESUR NEGATIVE 05/05/2018 0537     Raechel Marcos M.D. [facility] 05/07/2018, 3:10 PM  Pager: [phone] Between 7am to 7pm - call Pager - [phone]  After 7pm go to [email or web address] - password [email or web address]  Call night coverage person covering after 7pm

## 2018-05-07 NOTE — Progress Notes (Signed)
Inpatient Diabetes Program Recommendations  AACE/ADA: New Consensus Statement on Inpatient Glycemic Control (2015)  Target Ranges:  Prepandial:   less than 140 mg/dL      Peak postprandial:   less than 180 mg/dL (1-2 hours)      Critically ill patients:  140 - 180 mg/dL   Lab Results  Component Value Date   GLUCAP 230 (H) 05/07/2018   HGBA1C 8.9 (H) 05/06/2018   Met with patient to review current A1c of 8.9%. Explained what a A1c is and what it measures. Also reviewed goal A1c with patient, importance of good glucose control @ home, and blood sugar goals. Feels that it is connected with the high amount of steroids she has received over the course of the last three months. We discussed how steroids can make BS higher, educated on basic patho of DM, and survival skills.  Encouraged patient to begin being mindful of BS by getting a meter and checking BS twice a day. Does not have meter and will need one at discharge. Meter kit (includes test strips and lancets) ([id number]). Encouraged to take meter with her to next PCP visit and reach out with questions.   Thanks, Bronson Curb, MSN, RNC-OB Diabetes Coordinator [phone] (8a-5p)

## 2018-05-08 ENCOUNTER — Inpatient Hospital Stay (HOSPITAL_COMMUNITY): Payer: Medicare Other

## 2018-05-08 DIAGNOSIS — T17908A Unspecified foreign body in respiratory tract, part unspecified causing other injury, initial encounter: Secondary | ICD-10-CM

## 2018-05-08 DIAGNOSIS — Z7189 Other specified counseling: Secondary | ICD-10-CM

## 2018-05-08 DIAGNOSIS — Z515 Encounter for palliative care: Secondary | ICD-10-CM

## 2018-05-08 LAB — BASIC METABOLIC PANEL
Anion gap: 10 (ref 5–15)
BUN: 20 mg/dL (ref 6–20)
CALCIUM: 8.5 mg/dL — AB (ref 8.9–10.3)
CO2: 45 mmol/L — ABNORMAL HIGH (ref 22–32)
CREATININE: 0.71 mg/dL (ref 0.44–1.00)
Chloride: 92 mmol/L — ABNORMAL LOW (ref 101–111)
Glucose, Bld: 164 mg/dL — ABNORMAL HIGH (ref 65–99)
Potassium: 3.6 mmol/L (ref 3.5–5.1)
SODIUM: 147 mmol/L — AB (ref 135–145)

## 2018-05-08 LAB — CBC
HCT: 33.1 % — ABNORMAL LOW (ref 36.0–46.0)
Hemoglobin: 10.1 g/dL — ABNORMAL LOW (ref 12.0–15.0)
MCH: 30.2 pg (ref 26.0–34.0)
MCHC: 30.5 g/dL (ref 30.0–36.0)
MCV: 99.1 fL (ref 78.0–100.0)
Platelets: 446 10*3/uL — ABNORMAL HIGH (ref 150–400)
RBC: 3.34 MIL/uL — ABNORMAL LOW (ref 3.87–5.11)
RDW: 14.4 % (ref 11.5–15.5)
WBC: 18.2 10*3/uL — ABNORMAL HIGH (ref 4.0–10.5)

## 2018-05-08 LAB — GLUCOSE, CAPILLARY
GLUCOSE-CAPILLARY: 212 mg/dL — AB (ref 65–99)
Glucose-Capillary: 190 mg/dL — ABNORMAL HIGH (ref 65–99)
Glucose-Capillary: 194 mg/dL — ABNORMAL HIGH (ref 65–99)
Glucose-Capillary: 103 mg/dL — ABNORMAL HIGH (ref 65–99)
Glucose-Capillary: 239 mg/dL — ABNORMAL HIGH (ref 65–99)

## 2018-05-08 LAB — RPR: RPR Ser Ql: NONREACTIVE

## 2018-05-08 LAB — HIV ANTIBODY (ROUTINE TESTING W REFLEX): HIV SCREEN 4TH GENERATION: NONREACTIVE

## 2018-05-08 LAB — TSH: TSH: <0.01 u[IU]/mL — ABNORMAL LOW (ref 0.350–4.500)

## 2018-05-08 MED ORDER — INSULIN GLARGINE 100 UNIT/ML ~~LOC~~ SOLN
10.0000 [IU] | Freq: Every day | SUBCUTANEOUS | Status: DC
  Administered 2018-05-08 – 2018-05-09 (×2): 10 [IU] via SUBCUTANEOUS
  Filled 2018-05-08 (×2): qty 0.1

## 2018-05-08 MED ORDER — INSULIN ASPART 100 UNIT/ML ~~LOC~~ SOLN
3.0000 [IU] | Freq: Three times a day (TID) | SUBCUTANEOUS | Status: DC
  Administered 2018-05-08 – 2018-05-10 (×6): 3 [IU] via SUBCUTANEOUS

## 2018-05-08 MED ORDER — ORAL CARE MOUTH RINSE
15.0000 mL | Freq: Two times a day (BID) | OROMUCOSAL | Status: DC
  Administered 2018-05-09 – 2018-05-13 (×7): 15 mL via OROMUCOSAL

## 2018-05-08 NOTE — Evaluation (Signed)
Clinical/Bedside Swallow Evaluation Patient Details  Name: Carol Warren MRN: [id number] Date of Birth: 05/04/1946  Today's Date: 05/08/2018 Time: SLP Start Time (ACUTE ONLY): 7482 SLP Stop Time (ACUTE ONLY): 0915 SLP Time Calculation (min) (ACUTE ONLY): 23 min  Past Medical History:  Past Medical History:  Diagnosis Date  . Adrenal tumor   . AICD (automatic cardioverter/defibrillator) present   . Anxiety   . Aortic aneurysm ([place])   . Asthma   . Bicuspid aortic valve    CONGENITAL  . Cancer ([place])    skin   . Carpal tunnel syndrome, bilateral   . Chronic depression   . Chronic sinusitis   . 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 COPD (chronic obstructive pulmonary disease) ([place])   . Coronary artery disease   . Degenerative disc disease    CERVICAL AND LUMBAR (DR. Lorin Mercy)  . GERD (gastroesophageal reflux disease)   . HSV-1 (herpes simplex virus 1) infection   . HSV-2 (herpes simplex virus 2) infection   . Hypertension   . Insomnia   . Lumbar stenosis   . Osteoarthritis   . Osteopenia   . Pernicious anemia   . Plantar fasciitis   . Smoker   . Venous insufficiency of left leg   . Vitamin B 12 deficiency    Past Surgical History:  Past Surgical History:  Procedure Laterality Date  . AORTIC VALVE REPLACEMENT N/A 01/12/2015   Procedure: AORTIC VALVE REPLACEMENT (AVR);  Surgeon: Gaye Pollack, MD;  Location: [facility];  Service: Open Heart Surgery;  Laterality: N/A;  . ASCENDING AORTIC ROOT REPLACEMENT N/A 01/12/2015   Procedure: ASCENDING AORTIC ROOT REPLACEMENT;  Surgeon: Gaye Pollack, MD;  Location: [facility];  Service: Open Heart Surgery;  Laterality: N/A;  . BI-VENTRICULAR PACEMAKER INSERTION N/A 01/18/2015   Procedure: BI-VENTRICULAR PACEMAKER INSERTION (CRT-P);  Surgeon: Evans Lance, MD;  Location: [facility] CATH LAB;  Service: Cardiovascular;  Laterality: N/A;  . BUNIONECTOMY WITH HAMMERTOE RECONSTRUCTION Right   . CARPAL TUNNEL RELEASE Bilateral 2008  . CATARACT EXTRACTION  Bilateral    X2  . CATARACT EXTRACTION W/ INTRAOCULAR LENS  IMPLANT, BILATERAL    . COLONOSCOPY    . CYST EXCISION     right side of throat  . EXCISION OF BASAL CELL CA     SKIN   . EXCISION OF VULVAR CIS    . EYE SURGERY    . FUNCTIONAL ENDOSCOPIC SINUS SURGERY    . LEFT AND RIGHT HEART CATHETERIZATION WITH CORONARY ANGIOGRAM N/A 09/23/2014   Procedure: LEFT AND RIGHT HEART CATHETERIZATION WITH CORONARY ANGIOGRAM;  Surgeon: Minus Breeding, MD;  Location[facility] C[facility] LAB;  Service: Cardiovascular;  Laterality: N/A;  . MASS EXCISION Right 04/03/2017   Procedure: EXCISION CYST DEBRIDMENT DISTAL INTERPHALANEAL RIGHT INDEX FINGER;  Surgeon: Daryll Brod, MD;  Location: [facility];  Service: Orthopedics;  Laterality: Right;  REG/FAB  . MULTIPLE TOOTH EXTRACTIONS    . SINUS PROCEDURE  2009   x 6  . TEE WITHOUT CARDIOVERSION N/A 10/06/2014   Procedure: TRANSESOPHAGEAL ECHOCARDIOGRAM (TEE);  Surgeon: Sueanne Margarita, MD;  Location: [facility] ENDOSCOPY;  Service: Cardiovascular;  Laterality: N/A;  . TEE WITHOUT CARDIOVERSION N/A 01/12/2015   Procedure: TRANSESOPHAGEAL ECHOCARDIOGRAM (TEE);  Surgeon: Gaye Pollack, MD;  Location: [facility];  Service: Open Heart Surgery;  Laterality: N/A;  . UPPER GASTROINTESTINAL ENDOSCOPY     HPI:  72 yo female smoker with dyspnea, cough, fever, chills from PNA, COPD, and lung mass.  PMHx of CAD, HTN, HFpEF, s/p AICD, Depression, GERD, Pernicious anemia   Assessment / Plan / Recommendation Clinical Impression  Pt has prolonged mastication with soft solids and c/o difficulty chewing due to condition of dentition. Otherwise, she appears to have adequate oropharyngeal function; however, shortly after swallowing, pt often has belching and/or gurgling, followed by wet coughing. Soft solids are regurgitated and orally expectorated, minimally improved with SLP attempts to moisten the dry bread. SLP provided Min cues for smaller bites/sips, which appeared to reduce but not eliminate symptoms.  Overall symptoms are concerning for a possible esophageal component, with risk for aspiration post-prandially elevated. Discussed with MD - will soften diet to Dys 2 textures, continuing thin liquids with consideration for esophagram to better assess esophageal function. Pt does endorse a h/o reflux and H. pylori, although she does provide conflicting information about swallowing history. SLP will f/u pending results of other testing. SLP Visit Diagnosis: Dysphagia, unspecified (R13.10)    Aspiration Risk  Moderate aspiration risk    Diet Recommendation Dysphagia 2 (Fine chop);Thin liquid   Liquid Administration via: Cup;Straw Medication Administration: Crushed with puree Supervision: Patient able to self feed;Intermittent supervision to cue for compensatory strategies Compensations: Slow rate;Small sips/bites;Follow solids with liquid;Minimize environmental distractions Postural Changes: Seated upright at 90 degrees;Remain upright for at least 30 minutes after po intake    Other  Recommendations Recommended Consults: Consider esophageal assessment;Consider GI evaluation Oral Care Recommendations: Oral care BID   Follow up Recommendations (tba)      Frequency and Duration min 2x/week  1 week       Prognosis Prognosis for Safe Diet Advancement: Fair Barriers to Reach Goals: Other (Comment)(suspected esophageal involvement)      Swallow Study   General HPI: 72 yo female smoker with dyspnea, cough, fever, chills from PNA, COPD, and lung mass.  PMHx of CAD, HTN, HFpEF, s/p AICD, Depression, GERD, Pernicious anemia Type of Study: Bedside Swallow Evaluation Previous Swallow Assessment: none in chart Diet Prior to this Study: Regular;Thin liquids Temperature Spikes Noted: No Respiratory Status: Nasal cannula History of Recent Intubation: No Behavior/Cognition: Alert;Cooperative;Requires cueing Oral Care Completed by SLP: No Oral Cavity - Dentition: Poor condition Vision: Functional  for self-feeding Self-Feeding Abilities: Able to feed self Patient Positioning: Upright in chair Baseline Vocal Quality: Normal Volitional Cough: Strong    Oral/Motor/Sensory Function Overall Oral Motor/Sensory Function: Within functional limits(appears functional during breakfast meal)   Ice Chips Ice chips: Not tested   Thin Liquid Thin Liquid: Impaired Presentation: Cup;Self Fed;Straw Pharyngeal  Phase Impairments: Cough - Delayed    Nectar Thick Nectar Thick Liquid: Not tested   Honey Thick Honey Thick Liquid: Not tested   Puree Puree: Within functional limits Presentation: Self Fed;Spoon   Solid   GO   Solid: Impaired Presentation: Self Fed Oral Phase Functional Implications: Impaired mastication Pharyngeal Phase Impairments: Cough - Delayed        Germain Osgood 05/08/2018,9:25 AM  Germain Osgood, M.A. CCC-SLP [phone]

## 2018-05-08 NOTE — NC FL2 (Signed)
[place] LEVEL OF CARE SCREENING TOOL     IDENTIFICATION  Patient Name: Carol Warren Birthdate: 08-Nov-1946 Sex: female Admission Date (Current Location): 05/04/2018  [facility] and [place] Number:  [employer] and Address:  [facility] [place] [place] [address] [place], [place] [address]      Provider Number: [id number]  Attending Physician Name and Address:  Mendel Corning, MD  Relative Name and Phone Number:  Vida Roller niece, [phone]    Current Level of Care: Hospital Recommended Level of Care: [place] Prior Approval Number:    Date Approved/Denied:   PASRR Number: [id number]  Discharge Plan: SNF    Current Diagnoses: Patient Active Problem List   Diagnosis Date Noted  . Community acquired pneumonia 05/05/2018  . Mediastinal adenopathy 05/05/2018  . Metastasis to lymph nodes ([place]) 05/05/2018  . Mass of both adrenal glands ([place]) 05/05/2018  . CAP (community acquired pneumonia) 05/05/2018  . Hyperglycemia 05/05/2018  . Acute on chronic diastolic CHF (congestive heart failure) ([place])   . Mucoid cyst of joint 11/20/2016  . Osteoarthritis of finger of right hand 11/20/2016  . Spondylolisthesis of lumbar region 08/30/2016    Class: Chronic  . DDD (degenerative disc disease), lumbar 08/30/2016  . Spinal stenosis, lumbar region, with neurogenic claudication 08/30/2016    Class: Chronic  . Lumbar degenerative disc disease 08/30/2016  . Dizziness 06/12/2015  . ICD (implantable cardioverter-defibrillator), biventricular, in situ 02/16/2015  . Chronic diastolic CHF (congestive heart failure) ([place]) 02/16/2015  . Nonischemic cardiomyopathy ([place]) 01/19/2015  . Anemia, secondary to surgery 01/18/2015  . B12 deficiency 01/18/2015  . CHB (complete heart block)- post op 01/18/2015  . 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 Severe aortic stenosis 11/14/2014  . Pulmonary  hypertension due to left ventricular systolic dysfunction ([place]) 11/09/2014  . Personal history of colonic polyps 07/30/2013  . COPD GOLD 0/ still smoking  07/07/2013  . CIS (carcinoma in situ)   . Osteopenia   . HSV-1 (herpes simplex virus 1) infection   . HSV-2 (herpes simplex virus 2) infection   . Bicuspid aortic valve   . Aortic aneurysm ([place])   . Adrenal tumor   . Degenerative disc disease   . Smoker     Orientation RESPIRATION BLADDER Height & Weight     Self, Time, Situation, Place  O2(Nasal cannula 4L) Continent Weight: 55.3 kg (122 lb) Height:  5\' 1"  (154.9 cm)  BEHAVIORAL SYMPTOMS/MOOD NEUROLOGICAL BOWEL NUTRITION STATUS      Continent Diet(Please see DC Summary)  AMBULATORY STATUS COMMUNICATION OF NEEDS Skin   Limited Assist Verbally Normal                       Personal Care Assistance Level of Assistance  Bathing, Feeding, Dressing Bathing Assistance: Limited assistance Feeding assistance: Limited assistance Dressing Assistance: Limited assistance     Functional Limitations Info             SPECIAL CARE FACTORS FREQUENCY  PT (By licensed PT), OT (By licensed OT)     PT Frequency: 5x/week OT Frequency: 3x/week            Contractures      Additional Factors Info  Code Status, Allergies, Psychotropic, Insulin Sliding Scale Code Status Info: Partial Allergies Info: NKA Psychotropic Info: Prozac Insulin Sliding Scale Info: 3x daily with meals and at bedtime       Current Medications (  05/08/2018):  This is the current hospital active medication list Current Facility-Administered Medications  Medication Dose Route Frequency Provider Last Rate Last Dose  . 0.9 %  sodium chloride infusion  250 mL Intravenous PRN Opyd, Ilene Qua, MD      . acetaminophen (TYLENOL) tablet 650 mg  650 mg Oral Q6H PRN Opyd, Ilene Qua, MD       Or  . acetaminophen (TYLENOL) suppository 650 mg  650 mg Rectal Q6H PRN Opyd, Ilene Qua, MD      . albuterol (PROVENTIL)  (2.5 MG/3ML) 0.083% nebulizer solution 2.5 mg  2.5 mg Nebulization Q6H PRN Opyd, Ilene Qua, MD   2.5 mg at 05/06/18 1214  . aspirin EC tablet 325 mg  325 mg Oral Daily Opyd, Ilene Qua, MD   325 mg at 05/08/18 0930  . benzonatate (TESSALON) capsule 100 mg  100 mg Oral TID Rai, Ripudeep K, MD   100 mg at 05/08/18 0930  . bisacodyl (DULCOLAX) EC tablet 5 mg  5 mg Oral Daily PRN Opyd, Ilene Qua, MD      . carvedilol (COREG) tablet 18.75 mg  18.75 mg Oral BID WC Opyd, Ilene Qua, MD   18.75 mg at 05/08/18 0930  . cefTRIAXone (ROCEPHIN) 1 g in sodium chloride 0.9 % 100 mL IVPB  1 g Intravenous Q24H Opyd, Ilene Qua, MD   Stopped at 05/07/18 2248  . enoxaparin (LOVENOX) injection 40 mg  40 mg Subcutaneous Q24H Rai, Ripudeep K, MD   40 mg at 05/08/18 0932  . FLUoxetine (PROZAC) capsule 20 mg  20 mg Oral Daily Opyd, Ilene Qua, MD   20 mg at 05/08/18 0930  . furosemide (LASIX) tablet 40 mg  40 mg Oral Daily Rai, Ripudeep K, MD   40 mg at 05/08/18 0930  . gabapentin (NEURONTIN) capsule 100 mg  100 mg Oral TID Vianne Bulls, MD   100 mg at 05/08/18 0930  . guaiFENesin (MUCINEX) 12 hr tablet 600 mg  600 mg Oral BID Rai, Ripudeep K, MD   600 mg at 05/08/18 0930  . guaiFENesin-dextromethorphan (ROBITUSSIN DM) 100-10 MG/5ML syrup 5 mL  5 mL Oral Q4H PRN Rai, Ripudeep K, MD      . HYDROcodone-acetaminophen (NORCO/VICODIN) 5-325 MG per tablet 1-2 tablet  1-2 tablet Oral Q4H PRN Opyd, Ilene Qua, MD   2 tablet at 05/07/18 0456  . insulin aspart (novoLOG) injection 0-5 Units  0-5 Units Subcutaneous QHS Vianne Bulls, MD   2 Units at 05/07/18 2218  . insulin aspart (novoLOG) injection 0-9 Units  0-9 Units Subcutaneous TID WC Opyd, Ilene Qua, MD   2 Units at 05/08/18 1329  . insulin aspart (novoLOG) injection 3 Units  3 Units Subcutaneous TID WC Rai, Ripudeep K, MD   3 Units at 05/08/18 1328  . insulin glargine (LANTUS) injection 10 Units  10 Units Subcutaneous QHS Rai, Ripudeep K, MD      . ipratropium-albuterol  (DUONEB) 0.5-2.5 (3) MG/3ML nebulizer solution 3 mL  3 mL Nebulization TID Rai, Ripudeep K, MD   3 mL at 05/08/18 1438  . losartan (COZAAR) tablet 50 mg  50 mg Oral Daily Opyd, Ilene Qua, MD   50 mg at 05/08/18 0932  . methylPREDNISolone sodium succinate (SOLU-MEDROL) 40 mg/mL injection 40 mg  40 mg Intravenous Q12H Chesley Mires, MD   40 mg at 05/08/18 0513  . montelukast (SINGULAIR) tablet 10 mg  10 mg Oral QHS Opyd, Ilene Qua, MD   10 mg at  05/07/18 2219  . multivitamin with minerals tablet 1 tablet  1 tablet Oral Daily Opyd, Ilene Qua, MD   1 tablet at 05/08/18 0931  . nicotine (NICODERM CQ - dosed in mg/24 hours) patch 21 mg  21 mg Transdermal Daily Rai, Ripudeep K, MD   21 mg at 05/08/18 0931  . ondansetron (ZOFRAN) tablet 4 mg  4 mg Oral Q6H PRN Opyd, Ilene Qua, MD       Or  . ondansetron (ZOFRAN) injection 4 mg  4 mg Intravenous Q6H PRN Opyd, Ilene Qua, MD      . polyvinyl alcohol (LIQUIFILM TEARS) 1.4 % ophthalmic solution 1 drop  1 drop Both Eyes TID PRN Opyd, Ilene Qua, MD      . senna-docusate (Senokot-S) tablet 1 tablet  1 tablet Oral QHS PRN Opyd, Ilene Qua, MD      . sodium chloride flush (NS) 0.9 % injection 3 mL  3 mL Intravenous Q12H Opyd, Ilene Qua, MD   3 mL at 05/05/18 0915  . sodium chloride flush (NS) 0.9 % injection 3 mL  3 mL Intravenous Q12H Opyd, Ilene Qua, MD   3 mL at 05/08/18 0932  . sodium chloride flush (NS) 0.9 % injection 3 mL  3 mL Intravenous PRN Opyd, Ilene Qua, MD         Discharge Medications: Please see discharge summary for a list of discharge medications.  Relevant Imaging Results:  Relevant Lab Results:   Additional Information SSN: [facility] [place], [place]

## 2018-05-08 NOTE — Progress Notes (Addendum)
Name: Carol Warren MRN: [id number] DOB: [birth year]    ADMISSION DATE:  05/04/2018 CONSULTATION DATE:  5/8  REFERRING MD :  Tana Coast [facility])   CHIEF COMPLAINT:  Cough, dyspnea, abnormal CT   BRIEF PATIENT DESCRIPTION:  72 yo female current smoker with dyspnea, cough, fever, chills from PNA, COPD, and lung mass.  PMHx of CAD, HTN, HFpEF, s/p AICD, Depression, GERD, Pernicious anemia.  STUDIES:  04/13/18 CT chest >>>  Interval development of large R hilar, subcarinal, precarinal, R paratracheal and bilateral axillary adenopathy consistent with metastatic disease.  Interval development of bilateral adrenal masses concerning for metastatic disease as well.   SUBJECTIVE:  Continues to have a productive cough, No CO Chest pain, Oxygen at 4-5 L.   VITAL SIGNS: BP 125/68 (BP Location: Left Arm)   Pulse 89   Temp 98.7 F (37.1 C) (Oral)   Resp (!) 24   Ht 5\' 1"  (1.549 m)   Wt 122 lb (55.3 kg)   SpO2 97%   BMI 23.05 kg/m   PHYSICAL EXAMINATION:  General - pleasant, female sitting in chair, wearing nasal oxygen, cough Eyes - PERRLA ENT - no sinus tenderness, no oral exudate, no LAN, No JVD Cardiac - S1, S2, RRR,  no murmur, rub gallop Chest - Bilateral excursion, decreased BS, no wheeze, few rhonchi which clear with cough Abd - soft, non tender, [place]. BS + Ext - No obvious deformities, no edema Skin - no rashes, thin with few bruises noted Neuro -deconditioned at baseline Psych - anxious, States she is OCD  Labs: CBC Recent Labs    05/06/18 0458 05/07/18 0354 05/08/18 0638  WBC 18.4* 17.8* 18.2*  HGB 10.1* 9.4* 10.1*  HCT 32.1* 30.7* 33.1*  PLT 406* 400 446*    Coag's No results for input(s): APTT, INR in the last 72 hours.  BMET Recent Labs    05/06/18 0458 05/07/18 0354 05/08/18 0638  NA 144 143 147*  K 2.9* 3.0* 3.6  CL 94* 90* 92*  CO2 39* 42* 45*  BUN 16 19 20   CREATININE 0.79 0.74 0.71  GLUCOSE 281* 190* 164*    Electrolytes Recent Labs    05/06/18 0458  05/07/18 0354 05/08/18 2263  CALCIUM 8.3* 8.3* 8.5*    Sepsis Markers No results for input(s): PROCALCITON, O2SATVEN in the last 72 hours.  Invalid input(s): LACTICACIDVEN  ABG Recent Labs    05/07/18 1602  PHART 7.376  PCO2ART 76.0*  PO2ART 77.3*    Liver Enzymes No results for input(s): AST, ALT, ALKPHOS, BILITOT, ALBUMIN in the last 72 hours.  Cardiac Enzymes No results for input(s): TROPONINI, PROBNP in the last 72 hours.  Glucose Recent Labs    05/06/18 2113 05/07/18 0746 05/07/18 1128 05/07/18 1619 05/07/18 2048 05/08/18 0754  GLUCAP 273* 196* 215* 230* 212* 190*    Imaging Ct Head W & Wo Contrast  Result Date: 05/07/2018 CLINICAL DATA:  New onset altered level of consciousness. Lung mass with metastases. Evaluate for brain metastases. EXAM: CT HEAD WITHOUT AND WITH CONTRAST TECHNIQUE: Contiguous axial images were obtained from the base of the skull through the vertex without and with intravenous contrast CONTRAST:  6mL OMNIPAQUE IOHEXOL 300 MG/ML  SOLN COMPARISON:  Sinus CT 04/13/2018.  No prior brain imaging. FINDINGS: Brain: There is no evidence of acute infarct, intracranial hemorrhage, mass, midline shift, or extra-axial fluid collection. Mild cerebral atrophy is not greater than expected for patient's age. No abnormal enhancement is identified. Vascular: Mild calcified atherosclerosis at the  skull base. Major dural venous sinuses are grossly patent. Skull: No fracture or suspicious osseous lesion. Sinuses/Orbits: Postsurgical changes in the paranasal sinuses including right maxillary antrostomy. Mild right maxillary sinus mucosal thickening. Clear mastoid air cells. Unremarkable orbits. Other: None. IMPRESSION: Unremarkable CT appearance of the brain for age. No evidence of acute abnormality or metastatic disease. Electronically Signed   By: Logan Bores M.D.   On: 05/07/2018 14:07     ASSESSMENT / PLAN:  Post obstructive pneumonia. Moderate aspiration risk  per swallow study - continue Abx - f/u CXR>> Done am 5/10 - Follow Speech recommendations  - Continue Mucinex - Flutter valve  AECOPD. - continue duoneb - wean solumedrol as tolerated - BiPAP prn as ordered  Acute hypoxic respiratory failure. - oxygen to keep SpO2 90 to 95%  Lung mass. 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 Lamonte Sakai  Updated patient at bedside, no family present.  Magdalen Spatz, AGACNP-BC [place] [facility] 05/08/2018, 10:20 AM

## 2018-05-08 NOTE — Clinical Social Work Note (Signed)
Clinical Social Work Assessment  Patient Details  Name: Carol Warren MRN: [id number] Date of Birth: February 07, 1946  Date of referral:  05/08/18               Reason for consult:  Facility Placement                Permission sought to share information with:  Facility Sport and exercise psychologist, Family Supports Permission granted to share information::  No  Name::        Agency::  SNFs  Relationship::     Contact Information:     Housing/Transportation Living arrangements for the past 2 months:  Single Family Home Source of Information:  Patient Patient Interpreter Needed:  None Criminal Activity/Legal Involvement Pertinent to Current Situation/Hospitalization:  No - Comment as needed Significant Relationships:  Adult Children, Other Family Members, Spouse Lives with:  Spouse Do you feel safe going back to the place where you live?  No Need for family participation in patient care:  No (Coment)  Care giving concerns:  CSW received consult for possible SNF placement at time of discharge. CSW spoke with patient regarding PT recommendation of SNF placement at time of discharge. Patient had a difficult time breathing and speaking. Patient reported that patient's spouse is currently unable to care for patient at their home given patient's current physical needs and fall risk. Patient expressed understanding of PT recommendation and is agreeable to SNF placement at time of discharge. CSW to continue to follow and assist with discharge planning needs.   Social Worker assessment / plan:  CSW spoke with patient concerning possibility of rehab at [facility] before returning home.  Employment status:  Retired Nurse, adult PT Recommendations:  [place] / Referral to community resources:  [place]  Patient/Family's Response to care:  Patient recognizes need for rehab before returning home and is agreeable to a SNF in [place] instead  of [place]. CSW provided patient with a list.   Patient/Family's Understanding of and Emotional Response to Diagnosis, Current Treatment, and Prognosis:  Patient/family is realistic regarding therapy needs and expressed being hopeful for SNF placement. Patient expressed understanding of CSW role and discharge process as well as medical condition. No questions/concerns about plan or treatment.    Emotional Assessment Appearance:  Appears older than stated age Attitude/Demeanor/Rapport:  Engaged Affect (typically observed):  Accepting, Appropriate, Pleasant Orientation:  Oriented to Self, Oriented to Situation, Oriented to Place, Oriented to  Time Alcohol / Substance use:  Not Applicable Psych involvement (Current and /or in the community):  No (Comment)  Discharge Needs  Concerns to be addressed:  Care Coordination Readmission within the last 30 days:  No Current discharge risk:  Chronically ill, Dependent with Mobility Barriers to Discharge:  Continued Medical Work up   [employer], Latanya Presser 05/08/2018, 4:37 PM

## 2018-05-08 NOTE — Consult Note (Signed)
Consultation Note Date: 05/08/2018   Patient Name: Carol Warren  DOB: [birth year]  MRN: [id number]  Age / Sex: 72 y.o., female  PCP: Carol Kiel, MD Referring Physician: Mendel Corning, MD  Reason for Consultation: Establishing goals of care  HPI/Patient Profile: 72 y.o. female  with past medical history of COPD, ongoing tobacco abuse, chronic diastolic heart failure, CT scan done recently at outside facility with widespread chest adenopathy and bilateral adrenal nodules concerning for possibly advanced lung cancer admitted on 05/04/2018 with community-acquired versus postobstructive pneumonia.  Palliative consulted for goals of care.  Clinical Assessment and Goals of Care: I met today with Carol Warren and her Carol Warren.  Carol Warren is out of hearing and therefore patient reports that first call should be to her nieceif any needs.   I introduced palliative care as specialized medical care for people living with serious illness. It focuses on providing relief from the symptoms and stress of a serious illness. The goal is to improve quality of life for both the patient and the family.  We discussed clinical course as well as wishes moving forward in regard to advanced directives.  Concepts specific to code status and care to be given this hospitalization discussed.  We discussed difference between a aggressive medical intervention path and a palliative, comfort focused care path.    At this point, Carol Warren is able to verbalize that she likely has significant metastatic disease, however, she reports that she wants to get definitive diagnosis prior to determining what her next course of action would be moving forward.  Questions and concerns addressed.   PMT will continue to support holistically.  SUMMARY OF RECOMMENDATIONS   - Carol Warren repots that she feels that she needs more information regarding her diagnosis  prior to making decisions regarding long-term goals of care.  She is hopeful to improve from her pneumonia and transition out of the hospital to have PET scan and follow-up with Dr. Lamonte Warren as scheduled.  Her son is also supposed to be returning from deployment to [place] in the next week or so and would value his input into long-term goals of care as well. -Await results of PET scan and likely bronchoscopy for definitive diagnosis prior to further discussions regarding long-term goals of care. -DNR/DNI but would be agreeable to BiPAP if needed.  I placed an order for partial code with BiPAP as needed.  Code Status/Advance Care Planning:  DNR- We discussed that in light of multiple chronic medical problems that have worsened with this acute problem, care should be focused on interventions that are likely to allow the patient to achieve goal of getting back to home and spending time with family. I discussed with her and her Carol Warren regarding heroic interventions at the end-of-life and they agree this would not be in line with prior expressed wishes for a natural death or be likely to lead to getting well enough to go back home. They were in agreement with changing CODE STATUS to DO NOT RESUSCITATE.  Palliative Prophylaxis:   Aspiration, Delirium Protocol and Frequent Pain Assessment  Additional Recommendations (Limitations, Scope, Preferences):  Full Scope Treatment until point of cardiac or respiratory arrest  Psycho-social/Spiritual:   Desire for further Chaplaincy support:no  Additional Recommendations: Caregiving  Support/Resources  Prognosis:   Unable to determine-await results of PET scan and likely biopsy  Discharge Planning: To Be Determined      Primary Diagnoses: Present on Admission: . COPD GOLD 0/ still smoking  . Chronic diastolic CHF (congestive heart failure) (Carol Warren) . Community acquired pneumonia . Mediastinal adenopathy . 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 Mass of  both adrenal glands (Carol Warren) . CAP (community acquired pneumonia) . Hyperglycemia   I have reviewed the medical record, interviewed the patient and family, and examined the patient. The following aspects are pertinent.  Past Medical History:  Diagnosis Date  . Adrenal tumor   . AICD (automatic cardioverter/defibrillator) present   . Anxiety   . Aortic aneurysm ([place])   . Asthma   . Bicuspid aortic valve    CONGENITAL  . Cancer ([place])    skin   . Carpal tunnel syndrome, bilateral   . Chronic depression   . Chronic sinusitis   . 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 COPD (chronic obstructive pulmonary disease) ([place])   . Coronary artery disease   . Degenerative disc disease    CERVICAL AND LUMBAR (DR. Lorin Mercy)  . GERD (gastroesophageal reflux disease)   . HSV-1 (herpes simplex virus 1) infection   . HSV-2 (herpes simplex virus 2) infection   . Hypertension   . Insomnia   . Lumbar stenosis   . Osteoarthritis   . Osteopenia   . Pernicious anemia   . Plantar fasciitis   . Smoker   . Venous insufficiency of left leg   . Vitamin B 12 deficiency    Social History   Socioeconomic History  . Marital status: Married    Spouse name: Not on file  . Number of children: 2  . Years of education: Not on file  . Highest education level: Not on file  Occupational History  . Occupation: retired    Fish farm manager: RETIRED  Social Needs  . Financial resource strain: Not on file  . Food insecurity:    Worry: Not on file    Inability: Not on file  . Transportation needs:    Medical: Not on file    Non-medical: Not on file  Tobacco Use  . Smoking status: Current Every Day Smoker    Packs/day: 0.50    Years: 50.00    Pack years: 25.00    Types: Cigarettes  . Smokeless tobacco: Never Used  . Tobacco comment: uses vapor cig  Substance and Sexual Activity  . Alcohol use: No    Alcohol/week: 0.0 oz  . Drug use: No  . Sexual activity: Not on file  Lifestyle  . Physical activity:     Days per week: Not on file    Minutes per session: Not on file  . Stress: Not on file  Relationships  . Social connections:    Talks on phone: Not on file    Gets together: Not on file    Attends religious service: Not on file    Active member of club or organization: Not on file    Attends meetings of clubs or organizations: Not on file    Relationship status: Not on file  Other Topics Concern  . Not on file  Social  History Narrative  . Not on file   Family History  Problem Relation Age of Onset  . Pancreatic cancer Mother 71  . Diabetes Father   . Hypertension Father   . Heart disease Father 64       CAD  . Breast cancer Sister   . Diabetes Maternal Grandmother   . Hypertension Maternal Grandmother   . Cancer Maternal Grandfather        colon or stomach  . Hypertension Paternal Grandmother   . Heart disease Paternal Grandmother        Later onset  . Diverticulosis Paternal Grandmother   . Asthma Grandchild    Scheduled Meds: . aspirin  325 mg Oral Daily  . benzonatate  100 mg Oral TID  . carvedilol  18.75 mg Oral BID WC  . enoxaparin (LOVENOX) injection  40 mg Subcutaneous Q24H  . FLUoxetine  20 mg Oral Daily  . furosemide  40 mg Oral Daily  . gabapentin  100 mg Oral TID  . guaiFENesin  600 mg Oral BID  . insulin aspart  0-5 Units Subcutaneous QHS  . insulin aspart  0-9 Units Subcutaneous TID WC  . insulin aspart  3 Units Subcutaneous TID WC  . insulin glargine  10 Units Subcutaneous QHS  . ipratropium-albuterol  3 mL Nebulization TID  . losartan  50 mg Oral Daily  . methylPREDNISolone (SOLU-MEDROL) injection  40 mg Intravenous Q12H  . montelukast  10 mg Oral QHS  . multivitamin with minerals  1 tablet Oral Daily  . nicotine  21 mg Transdermal Daily  . sodium chloride flush  3 mL Intravenous Q12H  . sodium chloride flush  3 mL Intravenous Q12H   Continuous Infusions: . sodium chloride    . cefTRIAXone (ROCEPHIN)  IV Stopped (05/07/18 2248)   PRN  Meds:.sodium chloride, acetaminophen **OR** acetaminophen, albuterol, bisacodyl, guaiFENesin-dextromethorphan, HYDROcodone-acetaminophen, ondansetron **OR** ondansetron (ZOFRAN) IV, polyvinyl alcohol, senna-docusate, sodium chloride flush Medications Prior to Admission:  Prior to Admission medications   Medication Sig Start Date End Date Taking? Authorizing Provider  albuterol (PROVENTIL HFA;VENTOLIN HFA) 108 (90 BASE) MCG/ACT inhaler Inhale 1-2 puffs into the lungs every 6 (six) hours as needed for wheezing or shortness of breath (depends on congestion if 1-2 puffs).    Yes [provider]  albuterol (PROVENTIL) (2.5 MG/3ML) 0.083% nebulizer solution Take 2.5 mg by nebulization 2 (two) times daily as needed for wheezing.   Yes [provider]  amoxicillin-clavulanate (AUGMENTIN) 875-125 MG tablet Take 1 tablet by mouth 2 (two) times daily.   Yes [provider]  Artificial Tear Solution (GENTEAL TEARS OP) Apply 1 drop to eye 3 (three) times daily as needed (dry eyes).   Yes [provider]  aspirin EC 325 MG EC tablet Take 1 tablet (325 mg total) by mouth daily. 01/20/15  Yes Lars Pinks M, PA-C  Calcium Carbonate-Vitamin D (CALCIUM-D PO) Take 1 tablet by mouth daily.    Yes [provider]  carboxymethylcellulose (REFRESH PLUS) 0.5 % SOLN Place 1 drop into both eyes as needed (dry eyes).   Yes [provider]  carvedilol (COREG) 12.5 MG tablet TAKE 1 TABLET BY MOUTH TWICE DAILY WITH A MEAL 04/30/18  Yes Evans Lance, MD  carvedilol (COREG) 6.25 MG tablet Take 1 tablet (6.25 mg total) by mouth 2 (two) times daily. To take with 12.5 mg to make 18.75 mg 03/30/18  Yes Minus Breeding, MD  cetirizine (ZYRTEC) 10 MG tablet Take 10 mg by mouth  daily.   Yes [provider]  Docusate Calcium (STOOL SOFTENER PO) Take 2 tablets by mouth daily as needed (constipation).   Yes [provider]  FLUoxetine (PROZAC) 20 MG capsule Take 20  mg by mouth daily.   Yes [provider]  fluticasone (FLONASE) 50 MCG/ACT nasal spray Place 1 spray into the nose 2 (two) times daily.    Yes [provider]  furosemide (LASIX) 40 MG tablet Take 40 mg by mouth daily.   Yes [provider]  gabapentin (NEURONTIN) 100 MG capsule Take 100 mg by mouth 3 (three) times daily.   Yes [provider]  ibuprofen (ADVIL,MOTRIN) 200 MG tablet Take 200 mg by mouth daily as needed for headache or mild pain.    Yes [provider]  losartan (COZAAR) 50 MG tablet Take 50 mg by mouth daily.   Yes [provider]  montelukast (SINGULAIR) 10 MG tablet Take 10 mg by mouth at bedtime.   Yes [provider]  Multiple Vitamin (MULTIVITAMIN) capsule Take 1 capsule by mouth daily. 02/21/15  Yes Lars Pinks M, PA-C  Olopatadine HCl (PATANASE) 0.6 % SOLN Place 1 spray into the nose daily.    Yes [provider]   No Known Allergies Review of Systems  Constitutional: Positive for activity change and fatigue.  Respiratory: Positive for cough and shortness of breath.   Neurological: Positive for weakness.  Psychiatric/Behavioral: Positive for sleep disturbance.    Physical Exam  General: Alert, awake, in no acute distress.  HEENT: No bruits, no goiter, no JVD Heart: Regular rate and rhythm. No murmur appreciated. Lungs: Good air movement, clear Abdomen: Soft, nontender, nondistended, positive bowel sounds.  Ext: No significant edema Skin: Warm and dry Neuro: Grossly intact, nonfocal.  Vital Signs: BP 126/68 (BP Location: Right Arm)   Pulse 98   Temp 97.9 F (36.6 C) (Axillary)   Resp 18   Ht 5' 1"  (1.549 m)   Wt 55.3 kg (122 lb)   SpO2 99%   BMI 23.05 kg/m  Pain Scale: 0-10   Pain Score: 0-No pain   SpO2: SpO2: 99 % O2 Device:SpO2: 99 % O2 Flow Rate: .O2 Flow Rate (L/min): (S) 4 L/min(Titrated to 4L, sat 99%.)  IO: Intake/output summary:   Intake/Output Summary  (Last 24 hours) at 05/08/2018 1742 Last data filed at 05/08/2018 1419 Gross per 24 hour  Intake 710 ml  Output -  Net 710 ml    LBM: Last BM Date: 05/06/18 Baseline Weight: Weight: 55.3 kg (122 lb) Most recent weight: Weight: 55.3 kg (122 lb)     Palliative Assessment/Data:   Flowsheet Rows     Most Recent Value  Intake Tab  Referral Department  Hospitalist  Unit at Time of Referral  Med/Surg Unit  Palliative Care Primary Diagnosis  Pulmonary  Date Notified  05/07/18  Palliative Care Type  New Palliative care  Reason for referral  Clarify Goals of Care  Date of Admission  05/04/18  Date first seen by Palliative Care  05/08/18  # of days Palliative referral response time  1 Day(s)  # of days IP prior to Palliative referral  3  Clinical Assessment  Palliative Performance Scale Score  40%  Pain Max last 24 hours  4  Pain Min Last 24 hours  0  Psychosocial & Spiritual Assessment  Palliative Care Outcomes  Patient/Family meeting held?  Yes  Who was at the meeting?  Patient and her Carol Warren  Palliative Care Outcomes  Clarified goals of care      Time Total: 80 Greater than 50%  of this time was spent counseling and coordinating care related to the above assessment and plan.  Signed by: Micheline Rough, MD   Please contact Palliative Medicine Team phone at [phone] for questions and concerns.  For individual provider: See Shea Evans

## 2018-05-08 NOTE — Progress Notes (Signed)
[facility]                                                                              Patient Demographics  Carol Warren, is a 72 y.o. female, DOB - 07-16-1946, HTD:[id number]  Admit date - 05/04/2018   Admitting Physician Vianne Bulls, MD  Outpatient Primary MD for the patient is Ernestene Kiel, MD  Outpatient specialists:   LOS - 3  days   Medical records reviewed and are as summarized below:    Chief Complaint  Patient presents with  . Shortness of Breath       Brief summary   Carol Warren is a 72 year old female with history of COPD, ongoing tobacco abuse, chronic diastolic and prior systolic CHF admitted with productive cough, fevers and chills and shortness of breath. Of note, she had an outpatient CT scan done by her PCP last month in [place], Dr.Plotnov which was concerning for widespread chest adenopathy and bilateral adrenal nodules concerning for possible advanced lung cancer. She was referred to pulmonary Dr. Melvyn Novas and has of upcoming appointment soon on 5/9  Assessment & Plan    Principal Problem:   Community acquired pneumonia versus postobstructive -Continue ceftriaxone, Zithromax, -Blood cultures negative so far -Strep antigen negative, urine Legionella antigen negative.  Narrow antibiotics to IV Rocephin. - Leukocytosis likely due to steroids.  Active Problems:   COPD Exacerbation GOLD 0/ still smoking  -Continue IV steroids, scheduled nebs, O2 -Improving.  ABG was done yesterday secondary to confusion which showed compensated hypercapnia.  Today patient is alert and oriented. -BiPAP as needed, continue flutter valve, pulmonology following  Abnormal recent CT chest concerning for metastatic disease -Patient CT performed last month per admission H&P, showed adenopathy in the chest, bilateral axilla, bilateral adrenal masses concerning for metastatic disease -Pulmonology, Dr. Halford Chessman following, appreciate recommendations, outpatient PET/CT  and further management  Acute metabolic encephalopathy: -Improved, close to her baseline, unable to obtain MRI due to AICD UA negative for UTI -CT head with and without contrast showed no evidence of acute abnormality or metastatic disease. - ABG compensated with bicarb high.  BiPAP as needed  - Bicarb high could be from from contraction alkalosis from IV diuretics, IV Lasix discontinued, continue oral Lasix - TSH very low, T4 normal, possibly resolving thyroiditis, repeat thyroid function tests in 1 month.  Asymptomatic.  Acute on chronic diastolic CHF (congestive heart failure) ([place]) 2D echo 07/2017 showed 72%, grade 2 diastolic dysfunction -IV Lasix discontinued, continue oral Lasix -Continue strict I's and O's and daily weights   Diabetes mellitus, type II uncontrolled with hyperglycemia Hemoglobin A1c 8.9,  CBGs elevated, increase Lantus to 10 units daily, add meal coverage NovoLog 3 units 3 times daily AC, continue sliding scale insulin  Nicotine abuse - Placed on nicotine patch   ?  Aspiration/dysphagia -Evaluated by speech therapy today, recommended dysphagia 2 diet and esophagogram to rule out any mechanical obstruction  Goals of care Palliative consult placed  Code Status: Full CODE STATUS DVT Prophylaxis:  Lovenox Family Communication: Discussed in detail with the patient, all imaging results, lab results explained to the patient    Disposition Plan:  When medically clear  Time Spent in minutes 25 minutes  Procedures:  None  Consultants:   Pulmonology  Antimicrobials:      Medications  Scheduled Meds: . aspirin  325 mg Oral Daily  . benzonatate  100 mg Oral TID  . carvedilol  18.75 mg Oral BID WC  . enoxaparin (LOVENOX) injection  40 mg Subcutaneous Q24H  . FLUoxetine  20 mg Oral Daily  . furosemide  40 mg Oral Daily  . gabapentin  100 mg Oral TID  . guaiFENesin  600 mg Oral BID  . insulin aspart  0-5 Units Subcutaneous QHS  . insulin aspart  0-9  Units Subcutaneous TID WC  . insulin aspart  3 Units Subcutaneous TID WC  . insulin glargine  10 Units Subcutaneous QHS  . ipratropium-albuterol  3 mL Nebulization TID  . losartan  50 mg Oral Daily  . methylPREDNISolone (SOLU-MEDROL) injection  40 mg Intravenous Q12H  . montelukast  10 mg Oral QHS  . multivitamin with minerals  1 tablet Oral Daily  . nicotine  21 mg Transdermal Daily  . sodium chloride flush  3 mL Intravenous Q12H  . sodium chloride flush  3 mL Intravenous Q12H   Continuous Infusions: . sodium chloride    . cefTRIAXone (ROCEPHIN)  IV Stopped (05/07/18 2248)   PRN Meds:.sodium chloride, acetaminophen **OR** acetaminophen, albuterol, bisacodyl, guaiFENesin-dextromethorphan, HYDROcodone-acetaminophen, ondansetron **OR** ondansetron (ZOFRAN) IV, polyvinyl alcohol, senna-docusate, sodium chloride flush   Antibiotics   Anti-infectives (From admission, onward)   Start     Dose/Rate Route Frequency Ordered Stop   05/05/18 2200  cefTRIAXone (ROCEPHIN) 1 g in sodium chloride 0.9 % 100 mL IVPB     1 g 200 mL/hr over 30 Minutes Intravenous Every 24 hours 05/05/18 0111 05/11/18 2159   05/05/18 2200  azithromycin (ZITHROMAX) 500 mg in sodium chloride 0.9 % 250 mL IVPB  Status:  Discontinued     500 mg 250 mL/hr over 60 Minutes Intravenous Every 24 hours 05/05/18 0111 05/08/18 0913   05/04/18 2215  cefTRIAXone (ROCEPHIN) 1 g in sodium chloride 0.9 % 100 mL IVPB     1 g 200 mL/hr over 30 Minutes Intravenous  Once 05/04/18 2213 05/05/18 0007   05/04/18 2215  azithromycin (ZITHROMAX) tablet 500 mg     500 mg Oral  Once 05/04/18 2213 05/04/18 2316        Subjective:   Carol Warren was seen and examined today.  Sitting up in the chair, eating breakfast, alert and oriented, appears close to her baseline.  Knows that she is in [facility].  No fevers or chills.  No chest pain,  denies any dizziness, abdominal pain, N/V/D/C, new weakness, numbess, tingling. No acute events  overnight.    Objective:   Vitals:   05/07/18 2046 05/07/18 2102 05/08/18 0537 05/08/18 0828  BP: 124/71  125/68   Pulse: 73  89   Resp: (!) 24  (!) 24   Temp: 99.1 F (37.3 C)  98.7 F (37.1 C)   TempSrc: Oral  Oral   SpO2: 98% 96% 100% 97%  Weight:   55.3 kg (122 lb)   Height:        Intake/Output Summary (Last 24 hours) at 05/08/2018 1225 Last data filed at 05/08/2018 1014 Gross per 24 hour  Intake 590 ml  Output -  Net 590 ml     Wt Readings from Last 3 Encounters:  05/08/18 55.3 kg (122 lb)  03/19/18 56.2 kg (124 lb)  12/03/17 56.2 kg (124 lb)     Exam   General: Much more alert and oriented today, NAD  Eyes:  HEENT:    Cardiovascular: S1 S2 auscultated, no rubs, murmurs or gallops. Regular rate and rhythm. No pedal edema b/l  Respiratory: No significant wheezing  Gastrointestinal: Soft, nontender, nondistended, + bowel sounds  Ext: no pedal edema bilaterally  Neuro: no new deficits  Musculoskeletal: No digital cyanosis, clubbing  Skin: No rashes  Psych: confusion improving today   Data Reviewed:  I have personally reviewed following labs and imaging studies  Micro Results Recent Results (from the past 240 hour(s))  Blood Culture (routine x 2)     Status: None (Preliminary result)   Collection Time: 05/04/18 10:44 PM  Result Value Ref Range Status   Specimen Description BLOOD RIGHT ANTECUBITAL  Final   Special Requests   Final    BOTTLES DRAWN AEROBIC AND ANAEROBIC Blood Culture adequate volume   Culture   Final    NO GROWTH 4 DAYS Performed at [facility], [address] [place], [place] [address]    Report Status PENDING  Incomplete  Culture, sputum-assessment     Status: None   Collection Time: 05/07/18  5:07 AM  Result Value Ref Range Status   Specimen Description EXPECTORATED SPUTUM  Final   Special Requests   Final    NONE Performed at [facility], [address], [place], [place] [address]    Sputum evaluation  THIS SPECIMEN IS ACCEPTABLE FOR SPUTUM CULTURE  Final   Report Status 05/07/2018 FINAL  Final  Culture, respiratory (NON-Expectorated)     Status: None (Preliminary result)   Collection Time: 05/07/18  5:07 AM  Result Value Ref Range Status   Specimen Description EXPECTORATED SPUTUM  Final   Special Requests NONE Reflexed from [id number]  Final   Gram Stain   Final    FEW WBC PRESENT, PREDOMINANTLY PMN FEW GRAM POSITIVE COCCI RARE GRAM NEGATIVE RODS RARE GRAM POSITIVE RODS    Culture   Final    CULTURE REINCUBATED FOR BETTER GROWTH Performed at [facility], [place] [address], [place], [place] [address]    Report Status PENDING  Incomplete    Radiology Reports Ct Head W & Wo Contrast  Result Date: 05/07/2018 CLINICAL DATA:  New onset altered level of consciousness. Lung mass with metastases. Evaluate for brain metastases. EXAM: CT HEAD WITHOUT AND WITH CONTRAST TECHNIQUE: Contiguous axial images were obtained from the base of the skull through the vertex without and with intravenous contrast CONTRAST:  44mL OMNIPAQUE IOHEXOL 300 MG/ML  SOLN COMPARISON:  Sinus CT 04/13/2018.  No prior brain imaging. FINDINGS: Brain: There is no evidence of acute infarct, intracranial hemorrhage, mass, midline shift, or extra-axial fluid collection. Mild cerebral atrophy is not greater than expected for patient's age. No abnormal enhancement is identified. Vascular: Mild calcified atherosclerosis at the skull base. Major dural venous sinuses are grossly patent. Skull: No fracture or suspicious osseous lesion. Sinuses/Orbits: Postsurgical changes in the paranasal sinuses including right maxillary antrostomy. Mild right maxillary sinus mucosal thickening. Clear mastoid air cells. Unremarkable orbits. Other: None. IMPRESSION: Unremarkable CT appearance of the brain for age. No evidence of acute abnormality or metastatic disease. Electronically Signed   By: Logan Bores M.D.   On: 05/07/2018 14:07   Dg Chest Port 1  View  Result Date: 05/04/2018 CLINICAL DATA:  Cough and dyspnea EXAM: PORTABLE CHEST 1 VIEW COMPARISON:  CXR 04/08/2018 and CT  04/13/2018 FINDINGS: Stable cardiomegaly with aortic atherosclerosis. Aortic valvular replacement with median sternotomy sutures are noted. Left-sided ICD device with leads in the right atrium, coronary sinus and right ventricle as before. Edema and/or thickening along the right minor fissure. Coarsened interstitial lung markings with slightly more confluent airspace opacities in the right upper and middle lobes may reflect evolving pneumonia or postobstructive change. Right hilar adenopathy as seen on prior study is less apparent possibly from superimposition of the cardiac silhouette over the hila. No aggressive osseous abnormality. IMPRESSION: 1. New subpleural areas of airspace opacity in the right upper and middle lobe distribution with coarsened interstitial lung markings. Right upper and middle lobe pneumonia or evolving postobstructive change from known mediastinal and hilar adenopathy are possibilities. Follow-up in 4-6 weeks after treating for pneumonia is recommended. 2. Stable cardiomegaly with aortic atherosclerosis. 3. Status post aortic valvular replacement.  ICD device in place. Electronically Signed   By: Ashley Royalty M.D.   On: 05/04/2018 20:45    Lab Data:  CBC: Recent Labs  Lab 05/04/18 2031 05/05/18 0244 05/06/18 0458 05/07/18 0354 05/08/18 0638  WBC 19.7* 20.4* 18.4* 17.8* 18.2*  NEUTROABS 17.7* 18.4*  --   --   --   HGB 11.2* 10.2* 10.1* 9.4* 10.1*  HCT 34.7* 31.7* 32.1* 30.7* 33.1*  MCV 93.8 94.9 97.0 96.8 99.1  PLT 431* 431* 406* 400 503*   Basic Metabolic Panel: Recent Labs  Lab 05/04/18 2031 05/05/18 0244 05/06/18 0458 05/07/18 0354 05/08/18 0638  NA 139 140 144 143 147*  K 3.2* 3.2* 2.9* 3.0* 3.6  CL 92* 93* 94* 90* 92*  CO2 34* 35* 39* 42* 45*  GLUCOSE 271* 268* 281* 190* 164*  BUN 25* 21* 16 19 20   CREATININE 0.94 0.82 0.79 0.74  0.71  CALCIUM 9.4 9.0 8.3* 8.3* 8.5*  MG  --  2.1  --   --   --    GFR: Estimated Creatinine Clearance: 48.7 mL/min (by C-G formula based on SCr of 0.71 mg/dL). Liver Function Tests: Recent Labs  Lab 05/04/18 2031  AST 37  ALT 47  ALKPHOS 146*  BILITOT 0.4  PROT 6.4*  ALBUMIN 2.3*   No results for input(s): LIPASE, AMYLASE in the last 168 hours. Recent Labs  Lab 05/07/18 1532  AMMONIA 31   Coagulation Profile: No results for input(s): INR, PROTIME in the last 168 hours. Cardiac Enzymes: No results for input(s): CKTOTAL, CKMB, CKMBINDEX, TROPONINI in the last 168 hours. BNP (last 3 results) No results for input(s): PROBNP in the last 8760 hours. HbA1C: Recent Labs    05/06/18 1345  HGBA1C 8.9*   CBG: Recent Labs  Lab 05/07/18 1128 05/07/18 1619 05/07/18 2048 05/08/18 0754 05/08/18 1203  GLUCAP 215* 230* 212* 190* 194*   Lipid Profile: No results for input(s): CHOL, HDL, LDLCALC, TRIG, CHOLHDL, LDLDIRECT in the last 72 hours. Thyroid Function Tests: Recent Labs    05/07/18 2001 05/08/18 0638  TSH  --  <0.010*  FREET4 1.63  --    Anemia Panel: Recent Labs    05/07/18 1532  VITAMINB12 1,124*   Urine analysis:    Component Value Date/Time   COLORURINE YELLOW 05/05/2018 0537   APPEARANCEUR CLEAR 05/05/2018 0537   LABSPEC 1.014 05/05/2018 0537   PHURINE 6.0 05/05/2018 0537   GLUCOSEU 50 (A) 05/05/2018 0537   HGBUR SMALL (A) 05/05/2018 0537   BILIRUBINUR NEGATIVE 05/05/2018 [place] NEGATIVE 05/05/2018 0537   PROTEINUR 30 (A) 05/05/2018 8882  UROBILINOGEN 0.2 01/11/2015 0906   NITRITE NEGATIVE 05/05/2018 0537   LEUKOCYTESUR NEGATIVE 05/05/2018 0537     Edlin Ford M.D. [facility] 05/08/2018, 12:25 PM  Pager: [phone] Between 7am to 7pm - call Pager - [phone]  After 7pm go to [email or web address] - password [email or web address]  Call night coverage person covering after 7pm

## 2018-05-08 NOTE — Care Management Note (Addendum)
Case Management Note  Patient Details  Name: Carol Warren MRN: [id number] Date of Birth: [birth year]  Subjective/Objective:              Following for DC planning needs.  Spoke to patient and sister in law at bedside. Patient is in recliner eating lunch. Patient is comfortable, but having difficulty talking w/o getting short of breathe, is on su[pplimental O2 (states she does not have O2 at home), she also is having to frequently clear her mouth with yaunker. Suction cannister shows chewed food from this meal. Patient lives at home with spouse who she states is "not a caretaker." She verbalized a plan to have other family members care for her, however her sister in law at the beside corrected her stating that these people worked full time and would not be able to be able to come to rescue at home. She herself would not be able to due to multiple health issues of her own.  There does not seem to be adequate home support for this patient. Discussed possibility of SNF and instructed patient and famimily to discuss what a plan would be if she went home or if they would be open to considering SNF at DC. Discussed with [place] CSW, that patient would be most appropriate for SNF w palliative. Text paged Dr Domingo Cocking with Palliative Services as he met with patient this morning, will discuss his recommendations when he calls back.   Update Per Dr Domingo Cocking, patient will procede w PET scan after DC and may be open to home hospice services at that time, however for now could DC to home w home health (or SNF).  Text paged Dr Tana Coast requested PT OT evals      Action/Plan:  Currently disposition plan is unclear.  Please continue to follow for: Home health (will need to maximize services) Home Oxygen and suction Home Hospice   Expected Discharge Date:                  Expected Discharge Plan:     In-House Referral:     Discharge planning Services  CM Consult  Post Acute Care Choice:    Choice offered to:      DME Arranged:    DME Agency:     HH Arranged:    [place] Agency:     Status of Service:  In process, will continue to follow  If discussed at Long Length of Stay Meetings, dates discussed:    Additional Comments:  Carles Collet, RN 05/08/2018, 2:04 PM

## 2018-05-08 NOTE — Progress Notes (Signed)
Inpatient Diabetes Program Recommendations  AACE/ADA: New Consensus Statement on Inpatient Glycemic Control (2015)  Target Ranges:  Prepandial:   less than 140 mg/dL      Peak postprandial:   less than 180 mg/dL (1-2 hours)      Critically ill patients:  140 - 180 mg/dL   Results for Carol Warren, Carol Warren (MRN [id number]) as of 05/08/2018 11:49  Ref. Range 05/07/2018 07:46 05/07/2018 11:28 05/07/2018 16:19 05/07/2018 20:48  Glucose-Capillary Latest Ref Range: 65 - 99 mg/dL 196 (H)  2 units NOVOLOG  215 (H)  3 units NOVOLOG  230 (H)  3 units NOVOLOG  212 (H)  2 units NOVOLOG +  8 units LANTUS at 10pm    Results for Carol Warren, Carol Warren (MRN [id number]) as of 05/08/2018 11:49  Ref. Range 05/08/2018 07:54  Glucose-Capillary Latest Ref Range: 65 - 99 mg/dL 190 (H)     Current Orders: Lantus 8 units QHS       Novolog Sensitive Correction Scale/ SSI (0-9 units) TID AC + HS       Patient getting Solumedrol 40 mg BID.  Glucose levels likely elevated due to steroids.    MD- Please consider the following in-hospital insulin adjustments while patient getting steroids:  1. Increase Lantus to 10 units QHS  2. Start Novolog Meal Coverage: Novolog 3 units TID with meals (hold if pt eats <50% of meal)  3. May need oral medication at time of discharge- Please consider starting Metformin 500 mg BID for patient at time of discharge and have pt follow up with PCP.  4. Please also give pt CBG meter Rx at time of discharge: Order # [id number]     --Will follow patient during hospitalization--  Wyn Quaker RN, MSN, CDE Diabetes Coordinator Inpatient Glycemic Control Team Team Pager: [phone] (8a-5p)

## 2018-05-08 NOTE — Evaluation (Addendum)
Physical Therapy Evaluation Patient Details Name: Carol Warren MRN: [id number] DOB: 05/05/1946 Today's Date: 05/08/2018   History of Present Illness  72 year old female with history of COPD, ongoing tobacco abuse, lumbar fusion L3-4 and L4-5 2376, chronic diastolic and prior systolic CHF admitted with productive cough, fevers and chills and shortness of breath. Of note, she had an outpatient CT scan done by her PCP last month in [place], Dr.Plotnov which was concerning for widespread chest adenopathy and bilateral adrenal nodules concerning for possible advanced lung cancer.  Clinical Impression  Pt admitted with above diagnosis. Pt currently with functional limitations due to the deficits listed below (see PT Problem List). Pt ambulated 20' with mod assist for balance. She is at high risk for falls and needs hands on assist for mobility.  Pt has limited assistance available at home, so ST-SNF recommended.  Pt will benefit from skilled PT to increase their independence and safety with mobility to allow discharge to the venue listed below.       Follow Up Recommendations SNF;Supervision/Assistance - 24 hour;Supervision for mobility/OOB    Equipment Recommendations  Wheelchair (measurements PT);Wheelchair cushion (measurements PT)  Patient suffers from COPD which impairs their ability to perform daily activities like walking in the home.  A walker alone will not resolve the issues with performing activities of daily living. A wheelchair will allow patient to safely perform daily activities.  The patient can self propel in the home or has a caregiver who can provide assistance.       Recommendations for Other Services       Precautions / Restrictions Precautions Precautions: Fall Precaution Comments: recent fall a few days PTA Required Braces or Orthoses: Spinal Brace(pt had spinal fusion 2017, family stated the "screws aren't right" and she is supposed to wear the back brace when OOB) Spinal  Brace: Thoracolumbosacral orthotic Restrictions Weight Bearing Restrictions: No      Mobility  Bed Mobility               General bed mobility comments: up in recliner  Transfers Overall transfer level: Needs assistance Equipment used: 1 person hand held assist Transfers: Sit to/from Stand;Stand Pivot Transfers Sit to Stand: Mod assist Stand pivot transfers: Mod assist       General transfer comment: mod A to rise from recliner, VCs hand placement, mod A to pivot to recliner with HHA of 1  Ambulation/Gait Ambulation/Gait assistance: Mod assist Ambulation Distance (Feet): 20 Feet Assistive device: 1 person hand held assist Gait Pattern/deviations: Step-through pattern;Decreased stride length     General Gait Details: Mod assist for balance, pt ambulated with 6L O2 (pulse ox didn't read with ambulation), 99% on 6L at rest, distance limited by fatigue  Stairs            Wheelchair Mobility    Modified Rankin (Stroke Patients Only)       Balance Overall balance assessment: Needs assistance   Sitting balance-Leahy Scale: Fair     Standing balance support: Bilateral upper extremity supported Standing balance-Leahy Scale: Poor Standing balance comment: relies on BUE support                             Pertinent Vitals/Pain Pain Assessment: No/denies pain    Home Living Family/patient expects to be discharged to:: Private residence Living Arrangements: Spouse/significant other Available Help at Discharge: Family;Available PRN/intermittently(lives with spouse but "he isn't a caregiver" per pt and sister in law, sister  in law cannot do lifting assistance due to health problems) Type of Home: House Home Access: Stairs to enter Entrance Stairs-Rails: None Entrance Stairs-Number of Steps: 3 Home Layout: One level Home Equipment: Walker - 2 wheels;Cane - single point;Grab bars - tub/shower      Prior Function Level of Independence: Needs  assistance   Gait / Transfers Assistance Needed: has been limited with ambulation recently, holds onto walls and furniture, required assistance for balance just PTA per sister in law, had a fall out of a chair 6 days ago  ADL's / Homemaking Assistance Needed: needs assist        Hand Dominance        Extremity/Trunk Assessment        Lower Extremity Assessment Lower Extremity Assessment: Generalized weakness(B knee ext 4/5, sensation intact to light touch B feet)    Cervical / Trunk Assessment Cervical / Trunk Assessment: Kyphotic  Communication   Communication: HOH  Cognition Arousal/Alertness: Awake/alert Behavior During Therapy: WFL for tasks assessed/performed Overall Cognitive Status: Within Functional Limits for tasks assessed                                        General Comments      Exercises     Assessment/Plan    PT Assessment Patient needs continued PT services  PT Problem List Decreased mobility;Cardiopulmonary status limiting activity;Decreased balance;Decreased strength;Decreased activity tolerance       PT Treatment Interventions Gait training;Functional mobility training;Therapeutic activities;Therapeutic exercise;Patient/family education    PT Goals (Current goals can be found in the Care Plan section)  Acute Rehab PT Goals Patient Stated Goal: to get stronger PT Goal Formulation: With patient/family Time For Goal Achievement: 05/22/18 Potential to Achieve Goals: Fair    Frequency Min 3X/week   Barriers to discharge        Co-evaluation               AM-PAC PT "6 Clicks" Daily Activity  Outcome Measure Difficulty turning over in bed (including adjusting bedclothes, sheets and blankets)?: Unable Difficulty moving from lying on back to sitting on the side of the bed? : Unable Difficulty sitting down on and standing up from a chair with arms (e.g., wheelchair, bedside commode, etc,.)?: Unable Help needed moving  to and from a bed to chair (including a wheelchair)?: A Lot Help needed walking in hospital room?: A Lot Help needed climbing 3-5 steps with a railing? : Total 6 Click Score: 8    End of Session Equipment Utilized During Treatment: Gait belt;Oxygen, back brace Activity Tolerance: Patient limited by fatigue Patient left: in chair;with family/visitor present;with call bell/phone within reach Nurse Communication: Mobility status PT Visit Diagnosis: Unsteadiness on feet (R26.81);History of falling (Z91.81);Muscle weakness (generalized) (M62.81);Difficulty in walking, not elsewhere classified (R26.2)    Time: 1411-1440 PT Time Calculation (min) (ACUTE ONLY): 29 min   Charges:   PT Evaluation $PT Eval Moderate Complexity: 1 Mod PT Treatments $Gait Training: 8-22 mins   PT G Codes:          Blondell Reveal Kistler 05/08/2018, 3:01 PM [phone]

## 2018-05-08 NOTE — Progress Notes (Signed)
RT note: Patient resting well on 4L [place] satting 100%, HR 77, diminished BS bilaterally.  Bipap not indicated at this time.

## 2018-05-09 LAB — CULTURE, BLOOD (ROUTINE X 2)
CULTURE: NO GROWTH
SPECIAL REQUESTS: ADEQUATE

## 2018-05-09 LAB — BASIC METABOLIC PANEL
Anion gap: 9 (ref 5–15)
BUN: 21 mg/dL — ABNORMAL HIGH (ref 6–20)
CALCIUM: 8.4 mg/dL — AB (ref 8.9–10.3)
CO2: 45 mmol/L — AB (ref 22–32)
CREATININE: 0.7 mg/dL (ref 0.44–1.00)
Chloride: 91 mmol/L — ABNORMAL LOW (ref 101–111)
GFR calc Af Amer: >60 mL/min (ref >60)
GFR calc non Af Amer: >60 mL/min (ref >60)
GLUCOSE: 169 mg/dL — AB (ref 65–99)
Potassium: 3.3 mmol/L — ABNORMAL LOW (ref 3.5–5.1)
Sodium: 145 mmol/L (ref 135–145)

## 2018-05-09 LAB — GLUCOSE, CAPILLARY
GLUCOSE-CAPILLARY: 155 mg/dL — AB (ref 65–99)
GLUCOSE-CAPILLARY: 97 mg/dL (ref 65–99)
GLUCOSE-CAPILLARY: 150 mg/dL — AB (ref 65–99)
GLUCOSE-CAPILLARY: 155 mg/dL — AB (ref 65–99)

## 2018-05-09 LAB — CULTURE, RESPIRATORY

## 2018-05-09 LAB — CBC
HEMATOCRIT: 31.9 % — AB (ref 36.0–46.0)
Hemoglobin: 9.7 g/dL — ABNORMAL LOW (ref 12.0–15.0)
MCH: 29.7 pg (ref 26.0–34.0)
MCHC: 30.4 g/dL (ref 30.0–36.0)
MCV: 97.6 fL (ref 78.0–100.0)
Platelets: 438 10*3/uL — ABNORMAL HIGH (ref 150–400)
RBC: 3.27 MIL/uL — ABNORMAL LOW (ref 3.87–5.11)
RDW: 14 % (ref 11.5–15.5)
WBC: 19.5 10*3/uL — ABNORMAL HIGH (ref 4.0–10.5)

## 2018-05-09 LAB — CULTURE, RESPIRATORY W GRAM STAIN: Culture: NORMAL

## 2018-05-09 LAB — T3: T3, Total: 56 ng/dL — ABNORMAL LOW (ref 71–180)

## 2018-05-09 MED ORDER — SODIUM CHLORIDE 0.9 % IV SOLN
1.5000 g | Freq: Four times a day (QID) | INTRAVENOUS | Status: DC
  Administered 2018-05-09 – 2018-05-12 (×10): 1.5 g via INTRAVENOUS
  Filled 2018-05-09 (×15): qty 1.5

## 2018-05-09 MED ORDER — PIPERACILLIN-TAZOBACTAM 3.375 G IVPB
3.3750 g | Freq: Three times a day (TID) | INTRAVENOUS | Status: DC
  Filled 2018-05-09: qty 50

## 2018-05-09 MED ORDER — POTASSIUM CHLORIDE 10 MEQ/100ML IV SOLN
10.0000 meq | INTRAVENOUS | Status: AC
  Administered 2018-05-09 (×2): 10 meq via INTRAVENOUS
  Filled 2018-05-09: qty 100

## 2018-05-09 NOTE — Progress Notes (Addendum)
[facility]                                                                              Patient Demographics  Carol Warren, is a 72 y.o. female, DOB - 1946-07-05, EPP:[id number]  Admit date - 05/04/2018   Admitting Physician Vianne Bulls, MD  Outpatient Primary MD for the patient is Ernestene Kiel, MD  Outpatient specialists:   LOS - 4  days   Medical records reviewed and are as summarized below:    Chief Complaint  Patient presents with  . Shortness of Breath       Brief summary   [name] is a 72 year old female with history of COPD, ongoing tobacco abuse, chronic diastolic and prior systolic CHF admitted with productive cough, fevers and chills and shortness of breath. Of note, she had an outpatient CT scan done by her PCP last month in [place], Dr.Plotnov which was concerning for widespread chest adenopathy and bilateral adrenal nodules concerning for possible advanced lung cancer. She was referred to pulmonary Dr. Melvyn Novas and has of upcoming appointment soon on 5/9  Assessment & Plan    Principal Problem:   Community acquired pneumonia versus postobstructive -Blood cultures negative so far, patient appears to be aspirating and gagging -Placed on IV Zosyn -Leukocytosis worsening possibly due to steroids, no fevers  Active Problems:   COPD Exacerbation GOLD 0/ still smoking  -Continue IV steroids, scheduled nebs, O2  Abnormal recent CT chest concerning for metastatic disease -Patient CT performed last month per admission H&P, showed adenopathy in the chest, bilateral axilla, bilateral adrenal masses concerning for metastatic disease -Pulmonology, Dr. Halford Chessman following, appreciate recommendations, outpatient PET/CT and further management  Aspiration/dysphagia -Esophagogram shows distal esophageal stricture, and GI consulted plan for possible endoscopy on Monday -Change diet to clear liquid, SLP evaluation  Acute metabolic encephalopathy: -Alert and  oriented, appears close to baseline mental status -Hypercapnia, compensated due to severe COPD, nicotine abuse, BiPAP as needed -TSH low, normal T4, low T3, possibly resolving thyroiditis, repeat TFTs in 1 month   Acute on chronic diastolic CHF (congestive heart failure) ([place]) 2D echo 07/2017 showed 60%, grade 2 diastolic dysfunction Hold Lasix until patient is able to tolerate diet  Diabetes mellitus, type II uncontrolled with hyperglycemia Hemoglobin A1c 8.9,  -Continue Lantus 10 units daily at bedtime, NovoLog 3 units 3 times daily AC, sliding scale insulin  Nicotine abuse - Placed on nicotine patch   Hypokalemia  replaced  Code Status: Full CODE STATUS DVT Prophylaxis:  Lovenox Family Communication: Discussed in detail with the patient, all imaging results, lab results explained to the patient    Disposition Plan: When medically clear  Time Spent in minutes 25 minutes  Procedures:  None  Consultants:   Pulmonology GI  Antimicrobials:   IV Zosyn 5/11   Medications  Scheduled Meds: . aspirin  325 mg Oral Daily  . benzonatate  100 mg Oral TID  . carvedilol  18.75 mg Oral BID WC  . enoxaparin (LOVENOX) injection  40 mg Subcutaneous Q24H  . FLUoxetine  20 mg Oral Daily  . furosemide  40 mg Oral Daily  . gabapentin  100 mg Oral TID  . guaiFENesin  600 mg Oral BID  . insulin aspart  0-5 Units Subcutaneous QHS  . insulin aspart  0-9 Units Subcutaneous TID WC  . insulin aspart  3 Units Subcutaneous TID WC  . insulin glargine  10 Units Subcutaneous QHS  . ipratropium-albuterol  3 mL Nebulization TID  . losartan  50 mg Oral Daily  . mouth rinse  15 mL Mouth Rinse BID  . methylPREDNISolone (SOLU-MEDROL) injection  40 mg Intravenous Q12H  . montelukast  10 mg Oral QHS  . multivitamin with minerals  1 tablet Oral Daily  . nicotine  21 mg Transdermal Daily  . sodium chloride flush  3 mL Intravenous Q12H  . sodium chloride flush  3 mL Intravenous Q12H   Continuous  Infusions: . sodium chloride    . cefTRIAXone (ROCEPHIN)  IV Stopped (05/08/18 2303)   PRN Meds:.sodium chloride, acetaminophen **OR** acetaminophen, albuterol, bisacodyl, guaiFENesin-dextromethorphan, HYDROcodone-acetaminophen, ondansetron **OR** ondansetron (ZOFRAN) IV, polyvinyl alcohol, senna-docusate, sodium chloride flush   Antibiotics   Anti-infectives (From admission, onward)   Start     Dose/Rate Route Frequency Ordered Stop   05/05/18 2200  cefTRIAXone (ROCEPHIN) 1 g in sodium chloride 0.9 % 100 mL IVPB     1 g 200 mL/hr over 30 Minutes Intravenous Every 24 hours 05/05/18 0111 05/11/18 2159   05/05/18 2200  azithromycin (ZITHROMAX) 500 mg in sodium chloride 0.9 % 250 mL IVPB  Status:  Discontinued     500 mg 250 mL/hr over 60 Minutes Intravenous Every 24 hours 05/05/18 0111 05/08/18 0913   05/04/18 2215  cefTRIAXone (ROCEPHIN) 1 g in sodium chloride 0.9 % 100 mL IVPB     1 g 200 mL/hr over 30 Minutes Intravenous  Once 05/04/18 2213 05/05/18 0007   05/04/18 2215  azithromycin (ZITHROMAX) tablet 500 mg     500 mg Oral  Once 05/04/18 2213 05/04/18 2316        Subjective:   Carol Warren was seen and examined today.  Gagging and suctioning herself, coughing with coffee.  No chest pain.  Mental status much improved.  Low-grade temp 9 9.5 degrees F. patient denies any dizziness, abdominal pain, N/V/D/C, new weakness, numbess, tingling. No acute events overnight.    Objective:   Vitals:   05/08/18 2105 05/09/18 0504 05/09/18 0822 05/09/18 0859  BP: 124/69 136/66  140/70  Pulse: 84 86  90  Resp: (!) 24 (!) 24    Temp: 99.5 F (37.5 C) 97.7 F (36.5 C)    TempSrc: Oral Oral    SpO2: 100% 97% 93%   Weight:  55.3 kg (122 lb)    Height:        Intake/Output Summary (Last 24 hours) at 05/09/2018 1102 Last data filed at 05/09/2018 1036 Gross per 24 hour  Intake 546 ml  Output 100 ml  Net 446 ml     Wt Readings from Last 3 Encounters:  05/09/18 55.3 kg (122 lb)    03/19/18 56.2 kg (124 lb)  12/03/17 56.2 kg (124 lb)     Exam General: Alert and oriented, close to her baseline mental status, coughing and gagging Eyes:  HEENT:   Cardiovascular: S1 S2 auscultated, Regular rate and rhythm. No pedal edema b/l Respiratory: Bilateral expiratory wheezing Gastrointestinal: Soft, nontender, nondistended, + bowel sounds Ext: no pedal edema bilaterally Neuro: no new deficits Musculoskeletal: No digital cyanosis, clubbing Skin: No rashes Psych: Normal affect and demeanor, alert and oriented x3  Data Reviewed:  I have personally reviewed following labs and imaging studies  Micro Results Recent Results (from the past 240 hour(s))  Blood Culture (routine x 2)     Status: None (Preliminary result)   Collection Time: 05/04/18 10:44 PM  Result Value Ref Range Status   Specimen Description BLOOD RIGHT ANTECUBITAL  Final   Special Requests   Final    BOTTLES DRAWN AEROBIC AND ANAEROBIC Blood Culture adequate volume   Culture   Final    NO GROWTH 4 DAYS Performed at [facility], [address], [place], [place] [address]    Report Status PENDING  Incomplete  Culture, sputum-assessment     Status: None   Collection Time: 05/07/18  5:07 AM  Result Value Ref Range Status   Specimen Description EXPECTORATED SPUTUM  Final   Special Requests   Final    NONE Performed at [facility], [address] [place], [place] [address]    Sputum evaluation THIS SPECIMEN IS ACCEPTABLE FOR SPUTUM CULTURE  Final   Report Status 05/07/2018 FINAL  Final  Culture, respiratory (NON-Expectorated)     Status: None   Collection Time: 05/07/18  5:07 AM  Result Value Ref Range Status   Specimen Description EXPECTORATED SPUTUM  Final   Special Requests NONE Reflexed from [id number]  Final   Gram Stain   Final    FEW WBC PRESENT, PREDOMINANTLY PMN FEW GRAM POSITIVE COCCI RARE GRAM NEGATIVE RODS RARE GRAM POSITIVE RODS    Culture   Final    FEW Consistent  with normal respiratory flora. Performed at [facility], [place] [place], [place] [address]    Report Status 05/09/2018 FINAL  Final    Radiology Reports Dg Chest 2 View  Result Date: 05/08/2018 CLINICAL DATA:  Shortness of breath, cough. EXAM: CHEST - 2 VIEW COMPARISON:  Radiograph of May 04, 2018.  CT scan of April 13, 2018. FINDINGS: Stable cardiomegaly. Right hilar prominence is again noted consistent with adenopathy. Stable right upper lobe opacity is noted concerning for atelectasis or possibly pneumonia. Bony thorax is unremarkable. Left-sided pacemaker is unchanged in position. Status post aortic valve repair. Left lung is clear. Stable right upper lobe airspace opacity is noted. IMPRESSION: Stable right hilar prominence concerning for adenopathy. Stable right upper lobe airspace opacity is noted concerning for postobstructive atelectasis or possibly inflammation. Electronically Signed   By: Marijo Conception, M.D.   On: 05/08/2018 12:52   Ct Head W & Wo Contrast  Result Date: 05/07/2018 CLINICAL DATA:  New onset altered level of consciousness. Lung mass with metastases. Evaluate for brain metastases. EXAM: CT HEAD WITHOUT AND WITH CONTRAST TECHNIQUE: Contiguous axial images were obtained from the base of the skull through the vertex without and with intravenous contrast CONTRAST:  38mL OMNIPAQUE IOHEXOL 300 MG/ML  SOLN COMPARISON:  Sinus CT 04/13/2018.  No prior brain imaging. FINDINGS: Brain: There is no evidence of acute infarct, intracranial hemorrhage, mass, midline shift, or extra-axial fluid collection. Mild cerebral atrophy is not greater than expected for patient's age. No abnormal enhancement is identified. Vascular: Mild calcified atherosclerosis at the skull base. Major dural venous sinuses are grossly patent. Skull: No fracture or suspicious osseous lesion. Sinuses/Orbits: Postsurgical changes in the paranasal sinuses including right maxillary antrostomy. Mild right  maxillary sinus mucosal thickening. Clear mastoid air cells. Unremarkable orbits. Other: None. IMPRESSION: Unremarkable CT appearance of the brain for age. No evidence of acute abnormality or metastatic disease. Electronically  Signed   By: Logan Bores M.D.   On: 05/07/2018 14:07   Dg Esophagus  Result Date: 05/08/2018 CLINICAL DATA:  Aspiration into airway EXAM: ESOPHOGRAM/BARIUM SWALLOW TECHNIQUE: Single contrast examination was performed using thin barium or water soluble. FLUOROSCOPY TIME:  Fluoroscopy Time:  1.3 minutes Radiation Exposure Index (if provided by the fluoroscopic device): 9.4 mGy Number of Acquired Spot Images: 0 COMPARISON:  Chest CT 04/13/2018 FINDINGS: Limited study. Double contrast and recumbent motility imaging was not considered safe given the degree of stasis described below. Oblique pharyngeal imaging was negative for aspiration, stasis, or collection. Near full column stasis of thin liquid above a smoothly narrowed GE junction. No specific esophageal dysmotility, including aperistalsis. A 13 mm barium tablet was provided to the patient, but despite multiple attempts she could not pass it be on the mouth. Negative for hiatal hernia. IMPRESSION: 1. Smooth stricture at the GE junction with full column stasis that would slowly drain. A full esophagus was correlated with the patient's episodes of gagging. Despite multiple attempts, the patient could not swallow a 13 mm barium tablet. 2. Negative for aspiration. Electronically Signed   By: Monte Fantasia M.D.   On: 05/08/2018 17:12   Dg Chest Port 1 View  Result Date: 05/04/2018 CLINICAL DATA:  Cough and dyspnea EXAM: PORTABLE CHEST 1 VIEW COMPARISON:  CXR 04/08/2018 and CT 04/13/2018 FINDINGS: Stable cardiomegaly with aortic atherosclerosis. Aortic valvular replacement with median sternotomy sutures are noted. Left-sided ICD device with leads in the right atrium, coronary sinus and right ventricle as before. Edema and/or thickening  along the right minor fissure. Coarsened interstitial lung markings with slightly more confluent airspace opacities in the right upper and middle lobes may reflect evolving pneumonia or postobstructive change. Right hilar adenopathy as seen on prior study is less apparent possibly from superimposition of the cardiac silhouette over the hila. No aggressive osseous abnormality. IMPRESSION: 1. New subpleural areas of airspace opacity in the right upper and middle lobe distribution with coarsened interstitial lung markings. Right upper and middle lobe pneumonia or evolving postobstructive change from known mediastinal and hilar adenopathy are possibilities. Follow-up in 4-6 weeks after treating for pneumonia is recommended. 2. Stable cardiomegaly with aortic atherosclerosis. 3. Status post aortic valvular replacement.  ICD device in place. Electronically Signed   By: Ashley Royalty M.D.   On: 05/04/2018 20:45    Lab Data:  CBC: Recent Labs  Lab 05/04/18 2031 05/05/18 0244 05/06/18 0458 05/07/18 0354 05/08/18 8242 05/09/18 0443  WBC 19.7* 20.4* 18.4* 17.8* 18.2* 19.5*  NEUTROABS 17.7* 18.4*  --   --   --   --   HGB 11.2* 10.2* 10.1* 9.4* 10.1* 9.7*  HCT 34.7* 31.7* 32.1* 30.7* 33.1* 31.9*  MCV 93.8 94.9 97.0 96.8 99.1 97.6  PLT 431* 431* 406* 400 446* 353*   Basic Metabolic Panel: Recent Labs  Lab 05/05/18 0244 05/06/18 0458 05/07/18 0354 05/08/18 0638 05/09/18 0443  NA 140 144 143 147* 145  K 3.2* 2.9* 3.0* 3.6 3.3*  CL 93* 94* 90* 92* 91*  CO2 35* 39* 42* 45* 45*  GLUCOSE 268* 281* 190* 164* 169*  BUN 21* 16 19 20  21*  CREATININE 0.82 0.79 0.74 0.71 0.70  CALCIUM 9.0 8.3* 8.3* 8.5* 8.4*  MG 2.1  --   --   --   --    GFR: Estimated Creatinine Clearance: 48.7 mL/min (by C-G formula based on SCr of 0.7 mg/dL). Liver Function Tests: Recent Labs  Lab 05/04/18 2031  AST 37  ALT 47  ALKPHOS 146*  BILITOT 0.4  PROT 6.4*  ALBUMIN 2.3*   No results for input(s): LIPASE, AMYLASE in  the last 168 hours. Recent Labs  Lab 05/07/18 1532  AMMONIA 31   Coagulation Profile: No results for input(s): INR, PROTIME in the last 168 hours. Cardiac Enzymes: No results for input(s): CKTOTAL, CKMB, CKMBINDEX, TROPONINI in the last 168 hours. BNP (last 3 results) No results for input(s): PROBNP in the last 8760 hours. HbA1C: Recent Labs    05/06/18 1345  HGBA1C 8.9*   CBG: Recent Labs  Lab 05/08/18 0754 05/08/18 1203 05/08/18 1729 05/08/18 2104 05/09/18 0758  GLUCAP 190* 194* 103* 239* 155*   Lipid Profile: No results for input(s): CHOL, HDL, LDLCALC, TRIG, CHOLHDL, LDLDIRECT in the last 72 hours. Thyroid Function Tests: Recent Labs    05/07/18 2001 05/08/18 0638  TSH  --  <0.010*  FREET4 1.63  --    Anemia Panel: Recent Labs    05/07/18 1532  VITAMINB12 1,124*   Urine analysis:    Component Value Date/Time   COLORURINE YELLOW 05/05/2018 0537   APPEARANCEUR CLEAR 05/05/2018 0537   LABSPEC 1.014 05/05/2018 0537   PHURINE 6.0 05/05/2018 0537   GLUCOSEU 50 (A) 05/05/2018 0537   HGBUR SMALL (A) 05/05/2018 0537   BILIRUBINUR NEGATIVE 05/05/2018 0537   KETONESUR NEGATIVE 05/05/2018 0537   PROTEINUR 30 (A) 05/05/2018 0537   UROBILINOGEN 0.2 01/11/2015 0906   NITRITE NEGATIVE 05/05/2018 0537   LEUKOCYTESUR NEGATIVE 05/05/2018 0537     Johntae Broxterman M.D. [facility] 05/09/2018, 11:02 AM  Pager: [phone] Between 7am to 7pm - call Pager - [phone]  After 7pm go to [email or web address] - password [email or web address]  Call night coverage person covering after 7pm

## 2018-05-09 NOTE — Consult Note (Signed)
[facility] Gastroenterology Consultation Note  Referring Provider: Dr. Estill Cotta [facility]) Primary Care Physician:  Ernestene Kiel, MD  Reason for Consultation:  dysphagia  HPI: Carol Warren is a 72 y.o. female whom we've been asked to see for dysphagia.  Patient has history longstanding COPD, admitted for worsening dyspnea with lung mass and suspected post-obstructive pneumonia.  While in hospital patient endorses vague solid and liquid dysphagia (liquids worse), and Speech Therapy consult and esophagram studies done; there is suspicion of distal esophageal stricture, stasis of fluid in esophagus.  Patient reports many years of intermittent dysphagia, but denies any appreciable recent worsening.  Reports poor appetite and weight loss.  No blood in stool.  Had endoscopy in [place] in 2010, by Dr. Lyndel Safe, no esophageal stricture seen.  Last colonoscopy about 10 years ago; has history of polyps.   Past Medical History:  Diagnosis Date  . Adrenal tumor   . AICD (automatic cardioverter/defibrillator) present   . Anxiety   . Aortic aneurysm ([place])   . Asthma   . Bicuspid aortic valve    CONGENITAL  . Cancer ([place])    skin   . Carpal tunnel syndrome, bilateral   . Chronic depression   . Chronic sinusitis   . 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 COPD (chronic obstructive pulmonary disease) ([place])   . Coronary artery disease   . Degenerative disc disease    CERVICAL AND LUMBAR (DR. Lorin Mercy)  . GERD (gastroesophageal reflux disease)   . HSV-1 (herpes simplex virus 1) infection   . HSV-2 (herpes simplex virus 2) infection   . Hypertension   . Insomnia   . Lumbar stenosis   . Osteoarthritis   . Osteopenia   . Pernicious anemia   . Plantar fasciitis   . Smoker   . Venous insufficiency of left leg   . Vitamin B 12 deficiency     Past Surgical History:  Procedure Laterality Date  . AORTIC VALVE REPLACEMENT N/A 01/12/2015   Procedure: AORTIC VALVE REPLACEMENT (AVR);  Surgeon: Gaye Pollack, MD;  Location: [place];  Service: Open Heart Surgery;  Laterality: N/A;  . ASCENDING AORTIC ROOT REPLACEMENT N/A 01/12/2015   Procedure: ASCENDING AORTIC ROOT REPLACEMENT;  Surgeon: Gaye Pollack, MD;  Location: [place];  Service: Open Heart Surgery;  Laterality: N/A;  . BI-VENTRICULAR PACEMAKER INSERTION N/A 01/18/2015   Procedure: BI-VENTRICULAR PACEMAKER INSERTION (CRT-P);  Surgeon: Evans Lance, MD;  Location: [facility] CATH LAB;  Service: Cardiovascular;  Laterality: N/A;  . BUNIONECTOMY WITH HAMMERTOE RECONSTRUCTION Right   . CARPAL TUNNEL RELEASE Bilateral 2008  . CATARACT EXTRACTION Bilateral    X2  . CATARACT EXTRACTION W/ INTRAOCULAR LENS  IMPLANT, BILATERAL    . COLONOSCOPY    . CYST EXCISION     right side of throat  . EXCISION OF BASAL CELL CA     SKIN   . EXCISION OF VULVAR CIS    . EYE SURGERY    . FUNCTIONAL ENDOSCOPIC SINUS SURGERY    . LEFT AND RIGHT HEART CATHETERIZATION WITH CORONARY ANGIOGRAM N/A 09/23/2014   Procedure: LEFT AND RIGHT HEART CATHETERIZATION WITH CORONARY ANGIOGRAM;  Surgeon: Minus Breeding, MD;  Location: [facility] CATH LAB;  Service: Cardiovascular;  Laterality: N/A;  . MASS EXCISION Right 04/03/2017   Procedure: EXCISION CYST DEBRIDMENT DISTAL INTERPHALANEAL RIGHT INDEX FINGER;  Surgeon: Daryll Brod, MD;  Location: [place];  Service: Orthopedics;  Laterality: Right;  REG/FAB  . MULTIPLE TOOTH EXTRACTIONS    .  SINUS PROCEDURE  2009   x 6  . TEE WITHOUT CARDIOVERSION N/A 10/06/2014   Procedure: TRANSESOPHAGEAL ECHOCARDIOGRAM (TEE);  Surgeon: Sueanne Margarita, MD;  Location: [facility] ENDOSCOPY;  Service: Cardiovascular;  Laterality: N/A;  . TEE WITHOUT CARDIOVERSION N/A 01/12/2015   Procedure: TRANSESOPHAGEAL ECHOCARDIOGRAM (TEE);  Surgeon: Gaye Pollack, MD;  Location: [place];  Service: Open Heart Surgery;  Laterality: N/A;  . UPPER GASTROINTESTINAL ENDOSCOPY      Prior to Admission medications   Medication Sig Start Date End Date Taking? Authorizing Provider  albuterol  (PROVENTIL HFA;VENTOLIN HFA) 108 (90 BASE) MCG/ACT inhaler Inhale 1-2 puffs into the lungs every 6 (six) hours as needed for wheezing or shortness of breath (depends on congestion if 1-2 puffs).    Yes [provider]  albuterol (PROVENTIL) (2.5 MG/3ML) 0.083% nebulizer solution Take 2.5 mg by nebulization 2 (two) times daily as needed for wheezing.   Yes [provider]  amoxicillin-clavulanate (AUGMENTIN) 875-125 MG tablet Take 1 tablet by mouth 2 (two) times daily.   Yes [provider]  Artificial Tear Solution (GENTEAL TEARS OP) Apply 1 drop to eye 3 (three) times daily as needed (dry eyes).   Yes [provider]  aspirin EC 325 MG EC tablet Take 1 tablet (325 mg total) by mouth daily. 01/20/15  Yes Lars Pinks M, PA-C  Calcium Carbonate-Vitamin D (CALCIUM-D PO) Take 1 tablet by mouth daily.    Yes [provider]  carboxymethylcellulose (REFRESH PLUS) 0.5 % SOLN Place 1 drop into both eyes as needed (dry eyes).   Yes [provider]  carvedilol (COREG) 12.5 MG tablet TAKE 1 TABLET BY MOUTH TWICE DAILY WITH A MEAL 04/30/18  Yes Evans Lance, MD  carvedilol (COREG) 6.25 MG tablet Take 1 tablet (6.25 mg total) by mouth 2 (two) times daily. To take with 12.5 mg to make 18.75 mg 03/30/18  Yes Minus Breeding, MD  cetirizine (ZYRTEC) 10 MG tablet Take 10 mg by mouth daily.   Yes [provider]  Docusate Calcium (STOOL SOFTENER PO) Take 2 tablets by mouth daily as needed (constipation).   Yes [provider]  FLUoxetine (PROZAC) 20 MG capsule Take 20 mg by mouth daily.   Yes [provider]  fluticasone (FLONASE) 50 MCG/ACT nasal spray Place 1 spray into the nose 2 (two) times daily.    Yes [provider]  furosemide (LASIX) 40 MG tablet Take 40 mg by mouth daily.   Yes [provider]  gabapentin (NEURONTIN) 100 MG capsule Take 100 mg by mouth 3 (three) times daily.   Yes [provider]   ibuprofen (ADVIL,MOTRIN) 200 MG tablet Take 200 mg by mouth daily as needed for headache or mild pain.    Yes [provider]  losartan (COZAAR) 50 MG tablet Take 50 mg by mouth daily.   Yes [provider]  montelukast (SINGULAIR) 10 MG tablet Take 10 mg by mouth at bedtime.   Yes [provider]  Multiple Vitamin (MULTIVITAMIN) capsule Take 1 capsule by mouth daily. 02/21/15  Yes Lars Pinks M, PA-C  Olopatadine HCl (PATANASE) 0.6 % SOLN Place 1 spray into the nose daily.    Yes [provider]    Current Facility-Administered Medications  Medication Dose Route Frequency Provider Last Rate Last Dose  . 0.9 %  sodium chloride infusion  250 mL Intravenous PRN Opyd, Ilene Qua, MD      . acetaminophen (TYLENOL) tablet 650 mg  650 mg  Oral Q6H PRN Opyd, Ilene Qua, MD       Or  . acetaminophen (TYLENOL) suppository 650 mg  650 mg Rectal Q6H PRN Opyd, Ilene Qua, MD      . albuterol (PROVENTIL) (2.5 MG/3ML) 0.083% nebulizer solution 2.5 mg  2.5 mg Nebulization Q6H PRN Opyd, Ilene Qua, MD   2.5 mg at 05/06/18 1214  . aspirin EC tablet 325 mg  325 mg Oral Daily Opyd, Ilene Qua, MD   325 mg at 05/09/18 0902  . benzonatate (TESSALON) capsule 100 mg  100 mg Oral TID Rai, Ripudeep K, MD   100 mg at 05/09/18 0902  . bisacodyl (DULCOLAX) EC tablet 5 mg  5 mg Oral Daily PRN Opyd, Ilene Qua, MD      . carvedilol (COREG) tablet 18.75 mg  18.75 mg Oral BID WC Opyd, Ilene Qua, MD   18.75 mg at 05/09/18 0859  . cefTRIAXone (ROCEPHIN) 1 g in sodium chloride 0.9 % 100 mL IVPB  1 g Intravenous Q24H Opyd, Ilene Qua, MD   Stopped at 05/08/18 2303  . enoxaparin (LOVENOX) injection 40 mg  40 mg Subcutaneous Q24H Rai, Ripudeep K, MD   40 mg at 05/09/18 0900  . FLUoxetine (PROZAC) capsule 20 mg  20 mg Oral Daily Opyd, Ilene Qua, MD   20 mg at 05/09/18 0902  . furosemide (LASIX) tablet 40 mg  40 mg Oral Daily Rai, Ripudeep K, MD   40 mg at 05/09/18 0902  . gabapentin (NEURONTIN)  capsule 100 mg  100 mg Oral TID Vianne Bulls, MD   100 mg at 05/09/18 0902  . guaiFENesin (MUCINEX) 12 hr tablet 600 mg  600 mg Oral BID Rai, Ripudeep K, MD   600 mg at 05/09/18 0902  . guaiFENesin-dextromethorphan (ROBITUSSIN DM) 100-10 MG/5ML syrup 5 mL  5 mL Oral Q4H PRN Rai, Ripudeep K, MD      . HYDROcodone-acetaminophen (NORCO/VICODIN) 5-325 MG per tablet 1-2 tablet  1-2 tablet Oral Q4H PRN Opyd, Ilene Qua, MD   2 tablet at 05/08/18 2020  . insulin aspart (novoLOG) injection 0-5 Units  0-5 Units Subcutaneous QHS Vianne Bulls, MD   2 Units at 05/08/18 2221  . insulin aspart (novoLOG) injection 0-9 Units  0-9 Units Subcutaneous TID WC Opyd, Ilene Qua, MD   2 Units at 05/09/18 0857  . insulin aspart (novoLOG) injection 3 Units  3 Units Subcutaneous TID WC Rai, Ripudeep K, MD   3 Units at 05/09/18 0857  . insulin glargine (LANTUS) injection 10 Units  10 Units Subcutaneous QHS Rai, Ripudeep K, MD   10 Units at 05/08/18 2222  . ipratropium-albuterol (DUONEB) 0.5-2.5 (3) MG/3ML nebulizer solution 3 mL  3 mL Nebulization TID Rai, Ripudeep K, MD   3 mL at 05/09/18 0820  . losartan (COZAAR) tablet 50 mg  50 mg Oral Daily Opyd, Ilene Qua, MD   50 mg at 05/09/18 0904  . MEDLINE mouth rinse  15 mL Mouth Rinse BID Rai, Ripudeep K, MD   15 mL at 05/09/18 0904  . methylPREDNISolone sodium succinate (SOLU-MEDROL) 40 mg/mL injection 40 mg  40 mg Intravenous Q12H Chesley Mires, MD   40 mg at 05/09/18 0616  . montelukast (SINGULAIR) tablet 10 mg  10 mg Oral QHS Opyd, Ilene Qua, MD   10 mg at 05/08/18 2223  . multivitamin with minerals tablet 1 tablet  1 tablet Oral Daily Opyd, Ilene Qua, MD   1 tablet at 05/09/18 0902  . nicotine (NICODERM  CQ - dosed in mg/24 hours) patch 21 mg  21 mg Transdermal Daily Rai, Ripudeep K, MD   21 mg at 05/09/18 0903  . ondansetron (ZOFRAN) tablet 4 mg  4 mg Oral Q6H PRN Opyd, Ilene Qua, MD       Or  . ondansetron (ZOFRAN) injection 4 mg  4 mg Intravenous Q6H PRN Opyd, Ilene Qua, MD      . polyvinyl alcohol (LIQUIFILM TEARS) 1.4 % ophthalmic solution 1 drop  1 drop Both Eyes TID PRN Opyd, Ilene Qua, MD      . senna-docusate (Senokot-S) tablet 1 tablet  1 tablet Oral QHS PRN Opyd, Ilene Qua, MD      . sodium chloride flush (NS) 0.9 % injection 3 mL  3 mL Intravenous Q12H Opyd, Ilene Qua, MD   3 mL at 05/09/18 0904  . sodium chloride flush (NS) 0.9 % injection 3 mL  3 mL Intravenous Q12H Opyd, Ilene Qua, MD   3 mL at 05/08/18 2225  . sodium chloride flush (NS) 0.9 % injection 3 mL  3 mL Intravenous PRN Opyd, Ilene Qua, MD        Allergies as of 05/04/2018  . (No Known Allergies)    Family History  Problem Relation Age of Onset  . Pancreatic cancer Mother 44  . Diabetes Father   . Hypertension Father   . Heart disease Father 28       CAD  . Breast cancer Sister   . Diabetes Maternal Grandmother   . Hypertension Maternal Grandmother   . Cancer Maternal Grandfather        colon or stomach  . Hypertension Paternal Grandmother   . Heart disease Paternal Grandmother        Later onset  . Diverticulosis Paternal Grandmother   . Asthma Grandchild     Social History   Socioeconomic History  . Marital status: Married    Spouse name: Not on file  . Number of children: 2  . Years of education: Not on file  . Highest education level: Not on file  Occupational History  . Occupation: retired    Fish farm manager: RETIRED  Social Needs  . Financial resource strain: Not on file  . Food insecurity:    Worry: Not on file    Inability: Not on file  . Transportation needs:    Medical: Not on file    Non-medical: Not on file  Tobacco Use  . Smoking status: Current Every Day Smoker    Packs/day: 0.50    Years: 50.00    Pack years: 25.00    Types: Cigarettes  . Smokeless tobacco: Never Used  . Tobacco comment: uses vapor cig  Substance and Sexual Activity  . Alcohol use: No    Alcohol/week: 0.0 oz  . Drug use: No  . Sexual activity: Not on file  Lifestyle  .  Physical activity:    Days per week: Not on file    Minutes per session: Not on file  . Stress: Not on file  Relationships  . Social connections:    Talks on phone: Not on file    Gets together: Not on file    Attends religious service: Not on file    Active member of club or organization: Not on file    Attends meetings of clubs or organizations: Not on file    Relationship status: Not on file  . Intimate partner violence:    Fear of current or ex partner: Not  on file    Emotionally abused: Not on file    Physically abused: Not on file    Forced sexual activity: Not on file  Other Topics Concern  . Not on file  Social History Narrative  . Not on file    Review of Systems: As per HPI all others negative.  Physical Exam: Vital signs in last 24 hours: Temp:  [97.7 F (36.5 C)-99.5 F (37.5 C)] 97.7 F (36.5 C) (05/11 0504) Pulse Rate:  [82-98] 90 (05/11 0859) Resp:  [18-24] 24 (05/11 0504) BP: (124-140)/(66-70) 140/70 (05/11 0859) SpO2:  [93 %-100 %] 93 % (05/11 0822) Weight:  [55.3 kg (122 lb)] 55.3 kg (122 lb) (05/11 0504) Last BM Date: 05/06/18 General:   Alert, chronically ill-appearing, cachectic, tachypneic at rest Head:  Normocephalic and atraumatic. Eyes:  Sclera clear, no icterus.   Conjunctiva pink. Ears:  Normal auditory acuity. Nose:  No deformity, discharge,  or lesions. Mouth:  No deformity or lesions.  Oropharynx pink & moist. Neck:  Supple; no masses or thyromegaly. Lungs:  tachypneic at rest, Poor aeration with some diffuse wheezes, crackles, or rhonchi. No acute distress. Heart:  Regular rate and rhythm; no murmurs, clicks, rubs,  or gallops. Abdomen:  Soft, protuberant, non-tender. No masses, hepatosplenomegaly or hernias noted. Normal bowel sounds, without guarding, and without rebound.     Msk:  Cachectic and symmetric muscular wasting/atrophy; otherwise symmetrical without gross deformities. Normal posture. Pulses:  Normal pulses  noted. Extremities:  Without clubbing or edema. Neurologic:  Alert and  oriented x4; diffusely weak, grossly normal neurologically. Skin:  Some ecchymoses, Intact without significant lesions or rashes. Psych:  Alert and cooperative. Normal mood and affect.   Lab Results: Recent Labs    05/07/18 0354 05/08/18 0638 05/09/18 0443  WBC 17.8* 18.2* 19.5*  HGB 9.4* 10.1* 9.7*  HCT 30.7* 33.1* 31.9*  PLT 400 446* 438*   BMET Recent Labs    05/07/18 0354 05/08/18 0638 05/09/18 0443  NA 143 147* 145  K 3.0* 3.6 3.3*  CL 90* 92* 91*  CO2 42* 45* 45*  GLUCOSE 190* 164* 169*  BUN 19 20 21*  CREATININE 0.74 0.71 0.70  CALCIUM 8.3* 8.5* 8.4*   LFT No results for input(s): PROT, ALBUMIN, AST, ALT, ALKPHOS, BILITOT, BILIDIR, IBILI in the last 72 hours. PT/INR No results for input(s): LABPROT, INR in the last 72 hours.  Studies/Results: Dg Chest 2 View  Result Date: 05/08/2018 CLINICAL DATA:  Shortness of breath, cough. EXAM: CHEST - 2 VIEW COMPARISON:  Radiograph of May 04, 2018.  CT scan of April 13, 2018. FINDINGS: Stable cardiomegaly. Right hilar prominence is again noted consistent with adenopathy. Stable right upper lobe opacity is noted concerning for atelectasis or possibly pneumonia. Bony thorax is unremarkable. Left-sided pacemaker is unchanged in position. Status post aortic valve repair. Left lung is clear. Stable right upper lobe airspace opacity is noted. IMPRESSION: Stable right hilar prominence concerning for adenopathy. Stable right upper lobe airspace opacity is noted concerning for postobstructive atelectasis or possibly inflammation. Electronically Signed   By: Marijo Conception, M.D.   On: 05/08/2018 12:52   Ct Head W & Wo Contrast  Result Date: 05/07/2018 CLINICAL DATA:  New onset altered level of consciousness. Lung mass with metastases. Evaluate for brain metastases. EXAM: CT HEAD WITHOUT AND WITH CONTRAST TECHNIQUE: Contiguous axial images were obtained from the base  of the skull through the vertex without and with intravenous contrast CONTRAST:  30mL OMNIPAQUE IOHEXOL 300  MG/ML  SOLN COMPARISON:  Sinus CT 04/13/2018.  No prior brain imaging. FINDINGS: Brain: There is no evidence of acute infarct, intracranial hemorrhage, mass, midline shift, or extra-axial fluid collection. Mild cerebral atrophy is not greater than expected for patient's age. No abnormal enhancement is identified. Vascular: Mild calcified atherosclerosis at the skull base. Major dural venous sinuses are grossly patent. Skull: No fracture or suspicious osseous lesion. Sinuses/Orbits: Postsurgical changes in the paranasal sinuses including right maxillary antrostomy. Mild right maxillary sinus mucosal thickening. Clear mastoid air cells. Unremarkable orbits. Other: None. IMPRESSION: Unremarkable CT appearance of the brain for age. No evidence of acute abnormality or metastatic disease. Electronically Signed   By: Logan Bores M.D.   On: 05/07/2018 14:07   Dg Esophagus  Result Date: 05/08/2018 CLINICAL DATA:  Aspiration into airway EXAM: ESOPHOGRAM/BARIUM SWALLOW TECHNIQUE: Single contrast examination was performed using thin barium or water soluble. FLUOROSCOPY TIME:  Fluoroscopy Time:  1.3 minutes Radiation Exposure Index (if provided by the fluoroscopic device): 9.4 mGy Number of Acquired Spot Images: 0 COMPARISON:  Chest CT 04/13/2018 FINDINGS: Limited study. Double contrast and recumbent motility imaging was not considered safe given the degree of stasis described below. Oblique pharyngeal imaging was negative for aspiration, stasis, or collection. Near full column stasis of thin liquid above a smoothly narrowed GE junction. No specific esophageal dysmotility, including aperistalsis. A 13 mm barium tablet was provided to the patient, but despite multiple attempts she could not pass it be on the mouth. Negative for hiatal hernia. IMPRESSION: 1. Smooth stricture at the GE junction with full column stasis  that would slowly drain. A full esophagus was correlated with the patient's episodes of gagging. Despite multiple attempts, the patient could not swallow a 13 mm barium tablet. 2. Negative for aspiration. Electronically Signed   By: Monte Fantasia M.D.   On: 05/08/2018 17:12   Impression:  1.  Suspected lung cancer. 2.  Post-obstructive pneumonia. 3.  COPD; dyspneic at rest. 4.  Dysphagia.  Possible distal esophageal stricture.  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  Plan:  1.  Would treat patient's pneumonia over the weekend. 2.  Diet at discretion of Speech Therapy recommendations. 3.  [facility] will revisit Monday; consider endoscopy early next week, understanding there are not insubstantial risks of sedation in this patient.   LOS: 4 days   Ike Maragh M  05/09/2018, 9:18 AM  Cell [phone] If no answer or after 5 PM call [phone]

## 2018-05-09 NOTE — Progress Notes (Addendum)
  Speech Language Pathology Treatment: Dysphagia  Patient Details Name: SHABREE TEBBETTS MRN: [id number] DOB: [birth year] Today's Date: 05/09/2018 Time: 1300-1330 SLP Time Calculation (min) (ACUTE ONLY): 30 min  Assessment / Plan / Recommendation Clinical Impression  Pt seen for follow-up for dysphagia; currently on clears with MD order for re-assessment. Esophagram completed yesterday with findings: "Smooth stricture at the GE junction with full column stasis that would slowly drain. A full esophagus was correlated with the patient's episodes of gagging." No aspiration or pharyngeal stasis noted on exam. Endoscopy planned for early next week. Pt pleading to be allowed to have oatmeal. SLP reassessed with thin liquids, pureed solids. There is delayed, wet coughing with solids>liquids. SLP educated re: esophagram findings, and ongoing risks of aspiration in the setting of current infection, poor esophageal clearance. Pt verbalizes understanding and again reiterates her desire to be able to eat oatmeal, at least in small amounts. SLP educated re: strategies and esophageal precautions including slow rate, small bites, alternating solids with liquids, and upright posture for 30-60 minutes after eating. Recommend full liquids with esophageal precautions; pt may also have oatmeal per her request as she is accepting of aspiration risks. Reinforced precautions several times and provided written handout; pt verbalizing she will adhere to precautions and cease intake if she is coughing/regurgitating. Will follow up.    HPI HPI: 72 yo female smoker with dyspnea, cough, fever, chills from PNA, COPD, and lung mass.  PMHx of CAD, HTN, HFpEF, s/p AICD, Depression, GERD, Pernicious anemia      SLP Plan  Continue with current plan of care       Recommendations  Diet recommendations: Thin liquid;Other(comment)(pt may also have oatmeal per her request) Liquids provided via: Cup;Straw Medication Administration: Crushed  with puree Supervision: Patient able to self feed;Intermittent supervision to cue for compensatory strategies Compensations: Slow rate;Small sips/bites;Follow solids with liquid;Minimize environmental distractions Postural Changes and/or Swallow Maneuvers: Upright 30-60 min after meal;Seated upright 90 degrees                Oral Care Recommendations: Oral care BID Follow up Recommendations: Other (comment)(tba) SLP Visit Diagnosis: Dysphagia, unspecified (R13.10) Plan: Continue with current plan of care       [place], [place], [place] Speech-Language Pathologist [place] 05/09/2018, 1:38 PM

## 2018-05-09 NOTE — Evaluation (Signed)
Occupational Therapy Evaluation Patient Details Name: Carol Warren MRN: [id number] DOB: 29-Aug-1946 Today's Date: 05/09/2018    History of Present Illness 72 year old female with history of COPD, ongoing tobacco abuse, lumbar fusion L3-4 and L4-5 4481, chronic diastolic and prior systolic CHF admitted with productive cough, fevers and chills and shortness of breath. Of note, she had an outpatient CT scan done by her PCP last month in [place], Dr.Plotnov which was concerning for widespread chest adenopathy and bilateral adrenal nodules concerning for possible advanced lung cancer.   Clinical Impression   Pt admitted for above. Pt needing assist prior to admission. Feel pt will benefit from acute OT to increase independence prior to d/c.  Recommending SNF upon d/c.     Follow Up Recommendations  SNF;Supervision/Assistance - 24 hour    Equipment Recommendations  Other (comment)(defer to next venue)    Recommendations for Other Services       Precautions / Restrictions Precautions Precautions: Fall Precaution Comments: recent fall a few days PTA Required Braces or Orthoses: Spinal Brace(fusion in 2017-to wear brace when up) Spinal Brace: Thoracolumbosacral orthotic Restrictions Weight Bearing Restrictions: No      Mobility Bed Mobility Overal bed mobility: Needs Assistance Bed Mobility: Rolling;Sidelying to Sit Rolling: Supervision Sidelying to sit: Supervision          Transfers Overall transfer level: Needs assistance   Transfers: Sit to/from Stand Sit to Stand: Mod assist         General transfer comment: Mod-Min A for sit to stand transfer.     Balance Overall balance assessment: Needs assistance        Pt lost balance standing EOB and fell back onto bed. Pt requiring assist with ambulation and sit to stand transfer.                                  ADL either performed or assessed with clinical judgement   ADL Overall ADL's : Needs  assistance/impaired Eating/Feeding: Supervision/ safety;Sitting Eating/Feeding Details (indicate cue type and reason): nurse gave meds to pt in applesauce Grooming: Oral care;Standing;Minimal assistance(Min A to ambulate to sink/Mod to stand; otherwise Min guard.)           Upper Body Dressing : Moderate assistance;Sitting;Standing Upper Body Dressing Details (indicate cue type and reason): assist with back brace. Lower Body Dressing: Minimal assistance;Sit to/from stand   Toilet Transfer: Moderate assistance;Ambulation(Mod-Min A for sit to stand; sit to stand from bed and chair)           Functional mobility during ADLs: Moderate assistance;Rolling walker General ADL Comments: Educated on placement of grooming items at sink and use of cup for oral care to prevent back motion.Mentioned use of AE but pt doesn't seem interested. She managed socks but appeared to be bending back.     Vision         Perception     Praxis      Pertinent Vitals/Pain Pain Assessment: 0-10 Pain Score: 5  Pain Location: back Pain Descriptors / Indicators: Stabbing Pain Intervention(s): Monitored during session     Hand Dominance Right   Extremity/Trunk Assessment Upper Extremity Assessment Upper Extremity Assessment: Overall WFL for tasks assessed   Lower Extremity Assessment Lower Extremity Assessment: Defer to PT evaluation       Communication Communication Communication: No difficulties   Cognition Arousal/Alertness: Awake/alert Behavior During Therapy: WFL for tasks assessed/performed Overall Cognitive Status: No family/caregiver present to  determine baseline cognitive functioning                                     General Comments       Exercises     Shoulder Instructions      Home Living Family/patient expects to be discharged to:: Private residence Living Arrangements: Spouse/significant other Available Help at Discharge: Family;Available  PRN/intermittently(lives with spouse but "he isn't a caregiver" ) Type of Home: House Home Access: Stairs to enter [employer] of Steps: 3 Entrance Stairs-Rails: None Home Layout: One level     Bathroom Shower/Tub: Teacher, early years/pre: Standard     Home Equipment: Environmental consultant - 2 wheels;Cane - single point;Grab bars - tub/shower          Prior Functioning/Environment Level of Independence: Needs assistance  Gait / Transfers Assistance Needed: has been limited with ambulation recently, holds onto walls and furniture, required assistance for balance just PTA per sister in law, had a fall out of a chair 6 days ago ADL's / Homemaking Assistance Needed: needs assist            OT Problem List: Decreased strength;Decreased activity tolerance;Impaired balance (sitting and/or standing);Pain;Decreased knowledge of precautions;Decreased knowledge of use of DME or AE      OT Treatment/Interventions: Self-care/ADL training;DME and/or AE instruction;Patient/family education;Balance training;Therapeutic activities;Energy conservation;Cognitive remediation/compensation    OT Goals(Current goals can be found in the care plan section) Acute Rehab OT Goals Patient Stated Goal: not stated OT Goal Formulation: With patient Time For Goal Achievement: 05/16/18 Potential to Achieve Goals: Good ADL Goals Pt Will Perform Lower Body Bathing: sit to/from stand;with min guard assist Pt Will Perform Lower Body Dressing: with min guard assist;sit to/from stand Pt Will Transfer to Toilet: with min guard assist;ambulating;bedside commode Pt Will Perform Toileting - Clothing Manipulation and hygiene: with min guard assist;sit to/from stand  OT Frequency: Min 2X/week   Barriers to D/C:            Co-evaluation              AM-PAC PT "6 Clicks" Daily Activity     Outcome Measure Help from another person eating meals?: A Little Help from another person taking care of  personal grooming?: A Little Help from another person toileting, which includes using toliet, bedpan, or urinal?: A Lot Help from another person bathing (including washing, rinsing, drying)?: A Little Help from another person to put on and taking off regular upper body clothing?: A Lot Help from another person to put on and taking off regular lower body clothing?: A Little 6 Click Score: 16   End of Session Equipment Utilized During Treatment: Back brace;Oxygen  Activity Tolerance: Patient tolerated treatment well Patient left: in chair;with call bell/phone within reach;with chair alarm set  OT Visit Diagnosis: Unsteadiness on feet (R26.81)                Time: 3474-2595 OT Time Calculation (min): 28 min Charges:  OT General Charges $OT Visit: 1 Visit OT Evaluation $OT Eval Moderate Complexity: 1 Mod G-Codes:    Julie-Anne Torain L Zeinab Rodwell OTR/L 05/09/2018, 9:53 AM

## 2018-05-09 NOTE — Progress Notes (Addendum)
Pharmacy Antibiotic Note  Carol Warren is a 72 y.o. female admitted on 05/04/2018 with who presented with a cough/SOB - with recent outpatient CT concerning for metastatic disease, started on antibiotics for CAP/aspiration/post-obstructive PNA.  Pharmacy has been consulted to transition to Unasyn.   Plan: - Start Unasyn 1.5g IV every 6 hours - Consider something more narrow like Unasyn to cover for this - Will continue to follow renal function, culture results, LOT, and antibiotic de-escalation plans   Height: 5\' 1"  (154.9 cm) Weight: 122 lb (55.3 kg) IBW/kg (Calculated) : 47.8  Temp (24hrs), Avg:98.4 F (36.9 C), Min:97.7 F (36.5 C), Max:99.5 F (37.5 C)  Recent Labs  Lab 05/04/18 2041 05/05/18 0244 05/06/18 0458 05/07/18 0354 05/08/18 0638 05/09/18 0443  WBC  --  20.4* 18.4* 17.8* 18.2* 19.5*  CREATININE  --  0.82 0.79 0.74 0.71 0.70  LATICACIDVEN 1.17  --   --   --   --   --     Estimated Creatinine Clearance: 48.7 mL/min (by C-G formula based on SCr of 0.7 mg/dL).    No Known Allergies  Antimicrobials this admission: CTX 5/6 >> 5/10 Azithromycin 5/6 >> 5/9 Unasyn 5/11 >>  Dose adjustments this admission: n/a  Microbiology results: 5/6 BCx >> NGf 5/9 RCx >> NOF  Thank you for allowing pharmacy to be a part of this patient's care.  Alycia Rossetti, PharmD, BCPS Clinical Pharmacist Pager: [phone] Clinical phone for 05/09/2018 from 7a-3:30p: [phone] If after 3:30p, please call main pharmacy at: [phone] 05/09/2018 11:25 AM

## 2018-05-10 LAB — CBC
HCT: 30.3 % — ABNORMAL LOW (ref 36.0–46.0)
Hemoglobin: 9.2 g/dL — ABNORMAL LOW (ref 12.0–15.0)
MCH: 29.5 pg (ref 26.0–34.0)
MCHC: 30.4 g/dL (ref 30.0–36.0)
MCV: 97.1 fL (ref 78.0–100.0)
Platelets: 425 10*3/uL — ABNORMAL HIGH (ref 150–400)
RBC: 3.12 MIL/uL — ABNORMAL LOW (ref 3.87–5.11)
RDW: 14.1 % (ref 11.5–15.5)
WBC: 19.8 10*3/uL — AB (ref 4.0–10.5)

## 2018-05-10 LAB — GLUCOSE, CAPILLARY
GLUCOSE-CAPILLARY: 66 mg/dL (ref 65–99)
Glucose-Capillary: 88 mg/dL (ref 65–99)
Glucose-Capillary: 117 mg/dL — ABNORMAL HIGH (ref 65–99)
Glucose-Capillary: 141 mg/dL — ABNORMAL HIGH (ref 65–99)
Glucose-Capillary: 183 mg/dL — ABNORMAL HIGH (ref 65–99)

## 2018-05-10 LAB — BASIC METABOLIC PANEL
ANION GAP: 6 (ref 5–15)
BUN: 16 mg/dL (ref 6–20)
CALCIUM: 8.2 mg/dL — AB (ref 8.9–10.3)
CO2: 45 mmol/L — ABNORMAL HIGH (ref 22–32)
Chloride: 94 mmol/L — ABNORMAL LOW (ref 101–111)
Creatinine, Ser: 0.59 mg/dL (ref 0.44–1.00)
GFR calc Af Amer: >60 mL/min (ref >60)
GLUCOSE: 87 mg/dL (ref 65–99)
Potassium: 3.1 mmol/L — ABNORMAL LOW (ref 3.5–5.1)
SODIUM: 145 mmol/L (ref 135–145)

## 2018-05-10 MED ORDER — POTASSIUM CHLORIDE 20 MEQ PO PACK
40.0000 meq | PACK | Freq: Once | ORAL | Status: AC
  Administered 2018-05-10: 40 meq via ORAL
  Filled 2018-05-10: qty 2

## 2018-05-10 NOTE — Progress Notes (Signed)
[facility]                                                                              Patient Demographics  Carol Warren, is a 72 y.o. female, DOB - 10-17-1946, LGX:[id number]  Admit date - 05/04/2018   Admitting Physician Vianne Bulls, MD  Outpatient Primary MD for the patient is Ernestene Kiel, MD  Outpatient specialists:   LOS - 5  days   Medical records reviewed and are as summarized below:    Chief Complaint  Patient presents with  . Shortness of Breath       Brief summary   Carol Warren is a 72 year old female with history of COPD, ongoing tobacco abuse, chronic diastolic and prior systolic CHF admitted with productive cough, fevers and chills and shortness of breath. Of note, she had an outpatient CT scan done by her PCP last month in [place], Dr.Plotnov which was concerning for widespread chest adenopathy and bilateral adrenal nodules concerning for possible advanced lung cancer. She was referred to pulmonary Dr. Melvyn Novas and has of upcoming appointment soon on 5/9  Assessment & Plan    Principal Problem:   Community acquired pneumonia versus postobstructive -Blood cultures negative so far, also has aspiration component due to esophageal stricture -Continue IV Unasyn  -Leukocytosis worsening possibly due to steroids, no fevers  Active Problems:   COPD Exacerbation GOLD 0/ still smoking  -No wheezing today, continue nebs, O2, DC steroids  Abnormal recent CT chest concerning for metastatic disease -Patient CT performed last month per admission H&P, showed adenopathy in the chest, bilateral axilla, bilateral adrenal masses concerning for metastatic disease -Pulmonology, Dr. Halford Chessman was consulted, appreciate recommendations, outpatient PET/CT and further management  Aspiration/dysphagia -Esophagogram shows distal esophageal stricture,  - appreciate speech therapy following, diet full liquids, GI consulted for endoscopy.  Acute metabolic  encephalopathy: -Hypercapnia, compensated due to severe COPD, nicotine abuse, BiPAP as needed -TSH low, normal T4, low T3, possibly resolving thyroiditis, repeat TFTs in 1 month  Acute on chronic diastolic CHF (congestive heart failure) ([place]) 2D echo 07/2017 showed 81%, grade 2 diastolic dysfunction Hold Lasix until patient is able to tolerate diet  Diabetes mellitus, type II uncontrolled.  Now with hypoglycemia Hemoglobin A1c 8.9, CBG 60s early this morning -DC Lantus, discontinued NovoLog with meal coverage.  For now continue sliding scale insulin  Nicotine abuse - Placed on nicotine patch   Hypokalemia Potassium 3.1, replaced  Code Status: Full CODE STATUS DVT Prophylaxis:  Lovenox Family Communication: Discussed in detail with the patient, all imaging results, lab results explained to the patient    Disposition Plan: When medically clear  Time Spent in minutes 25 minutes  Procedures:  None  Consultants:   Pulmonology GI  Antimicrobials:   IV Zosyn 5/11   Medications  Scheduled Meds: . aspirin  325 mg Oral Daily  . benzonatate  100 mg Oral TID  . carvedilol  18.75 mg Oral BID WC  . enoxaparin (LOVENOX) injection  40 mg Subcutaneous Q24H  . FLUoxetine  20 mg Oral Daily  . gabapentin  100 mg Oral TID  . guaiFENesin  600 mg Oral BID  .  insulin aspart  0-5 Units Subcutaneous QHS  . insulin aspart  0-9 Units Subcutaneous TID WC  . ipratropium-albuterol  3 mL Nebulization TID  . losartan  50 mg Oral Daily  . mouth rinse  15 mL Mouth Rinse BID  . methylPREDNISolone (SOLU-MEDROL) injection  40 mg Intravenous Q12H  . montelukast  10 mg Oral QHS  . multivitamin with minerals  1 tablet Oral Daily  . nicotine  21 mg Transdermal Daily  . sodium chloride flush  3 mL Intravenous Q12H  . sodium chloride flush  3 mL Intravenous Q12H   Continuous Infusions: . sodium chloride    . ampicillin-sulbactam (UNASYN) IV Stopped (05/10/18 0657)   PRN Meds:.sodium chloride,  acetaminophen **OR** acetaminophen, albuterol, bisacodyl, guaiFENesin-dextromethorphan, HYDROcodone-acetaminophen, ondansetron **OR** ondansetron (ZOFRAN) IV, polyvinyl alcohol, senna-docusate, sodium chloride flush   Antibiotics   Anti-infectives (From admission, onward)   Start     Dose/Rate Route Frequency Ordered Stop   05/09/18 1200  piperacillin-tazobactam (ZOSYN) IVPB 3.375 g  Status:  Discontinued     3.375 g 12.5 mL/hr over 240 Minutes Intravenous Every 8 hours 05/09/18 1125 05/09/18 1139   05/09/18 1200  ampicillin-sulbactam (UNASYN) 1.5 g in sodium chloride 0.9 % 100 mL IVPB     1.5 g 200 mL/hr over 30 Minutes Intravenous Every 6 hours 05/09/18 1139     05/05/18 2200  cefTRIAXone (ROCEPHIN) 1 g in sodium chloride 0.9 % 100 mL IVPB  Status:  Discontinued     1 g 200 mL/hr over 30 Minutes Intravenous Every 24 hours 05/05/18 0111 05/09/18 1103   05/05/18 2200  azithromycin (ZITHROMAX) 500 mg in sodium chloride 0.9 % 250 mL IVPB  Status:  Discontinued     500 mg 250 mL/hr over 60 Minutes Intravenous Every 24 hours 05/05/18 0111 05/08/18 0913   05/04/18 2215  cefTRIAXone (ROCEPHIN) 1 g in sodium chloride 0.9 % 100 mL IVPB     1 g 200 mL/hr over 30 Minutes Intravenous  Once 05/04/18 2213 05/05/18 0007   05/04/18 2215  azithromycin (ZITHROMAX) tablet 500 mg     500 mg Oral  Once 05/04/18 2213 05/04/18 2316        Subjective:   Carol Warren was seen and examined today.  Sleepy, arousable, no fevers.  patient denies any dizziness, abdominal pain, N/V/D/C, new weakness, numbess, tingling. No acute events overnight.    Objective:   Vitals:   05/09/18 1918 05/09/18 2118 05/10/18 0543 05/10/18 1006  BP:  117/62 (!) 142/79 (!) 143/75  Pulse:  79 87 94  Resp:  15    Temp:  98 F (36.7 C) 97.6 F (36.4 C)   TempSrc:  Oral Oral   SpO2: 94% 98% 94%   Weight:      Height:        Intake/Output Summary (Last 24 hours) at 05/10/2018 1104 Last data filed at 05/09/2018 1500 Gross per  24 hour  Intake 638 ml  Output -  Net 638 ml     Wt Readings from Last 3 Encounters:  05/09/18 55.3 kg (122 lb)  03/19/18 56.2 kg (124 lb)  12/03/17 56.2 kg (124 lb)     Exam   General: Sleepy but arousable, NAD  Eyes:   HEENT:  Atraumatic, normocephalic, normal oropharynx  Cardiovascular: S1 S2 auscultated, Regular rate and rhythm. No pedal edema b/l  Respiratory: CTAB  Gastrointestinal: Soft, nontender, nondistended, + bowel sounds  Ext: no pedal edema bilaterally  Neuro:   Musculoskeletal: No digital cyanosis,  clubbing  Skin: No rashes  Psych: sleepy    Data Reviewed:  I have personally reviewed following labs and imaging studies  Micro Results Recent Results (from the past 240 hour(s))  Blood Culture (routine x 2)     Status: None   Collection Time: 05/04/18 10:44 PM  Result Value Ref Range Status   Specimen Description BLOOD RIGHT ANTECUBITAL  Final   Special Requests   Final    BOTTLES DRAWN AEROBIC AND ANAEROBIC Blood Culture adequate volume   Culture   Final    NO GROWTH 5 DAYS Performed at [facility], [address] [place], [place] [address]    Report Status 05/09/2018 FINAL  Final  Culture, sputum-assessment     Status: None   Collection Time: 05/07/18  5:07 AM  Result Value Ref Range Status   Specimen Description EXPECTORATED SPUTUM  Final   Special Requests   Final    NONE Performed at [facility], [place] [address] [place], [place] [address]    Sputum evaluation THIS SPECIMEN IS ACCEPTABLE FOR SPUTUM CULTURE  Final   Report Status 05/07/2018 FINAL  Final  Culture, respiratory (NON-Expectorated)     Status: None   Collection Time: 05/07/18  5:07 AM  Result Value Ref Range Status   Specimen Description EXPECTORATED SPUTUM  Final   Special Requests NONE Reflexed from [id number]  Final   Gram Stain   Final    FEW WBC PRESENT, PREDOMINANTLY PMN FEW GRAM POSITIVE COCCI RARE GRAM NEGATIVE RODS RARE GRAM POSITIVE RODS     Culture   Final    FEW Consistent with normal respiratory flora. Performed at [facility], [place] [address], [place], [place] [address]    Report Status 05/09/2018 FINAL  Final    Radiology Reports Dg Chest 2 View  Result Date: 05/08/2018 CLINICAL DATA:  Shortness of breath, cough. EXAM: CHEST - 2 VIEW COMPARISON:  Radiograph of May 04, 2018.  CT scan of April 13, 2018. FINDINGS: Stable cardiomegaly. Right hilar prominence is again noted consistent with adenopathy. Stable right upper lobe opacity is noted concerning for atelectasis or possibly pneumonia. Bony thorax is unremarkable. Left-sided pacemaker is unchanged in position. Status post aortic valve repair. Left lung is clear. Stable right upper lobe airspace opacity is noted. IMPRESSION: Stable right hilar prominence concerning for adenopathy. Stable right upper lobe airspace opacity is noted concerning for postobstructive atelectasis or possibly inflammation. Electronically Signed   By: Marijo Conception, M.D.   On: 05/08/2018 12:52   Ct Head W & Wo Contrast  Result Date: 05/07/2018 CLINICAL DATA:  New onset altered level of consciousness. Lung mass with metastases. Evaluate for brain metastases. EXAM: CT HEAD WITHOUT AND WITH CONTRAST TECHNIQUE: Contiguous axial images were obtained from the base of the skull through the vertex without and with intravenous contrast CONTRAST:  38mL OMNIPAQUE IOHEXOL 300 MG/ML  SOLN COMPARISON:  Sinus CT 04/13/2018.  No prior brain imaging. FINDINGS: Brain: There is no evidence of acute infarct, intracranial hemorrhage, mass, midline shift, or extra-axial fluid collection. Mild cerebral atrophy is not greater than expected for patient's age. No abnormal enhancement is identified. Vascular: Mild calcified atherosclerosis at the skull base. Major dural venous sinuses are grossly patent. Skull: No fracture or suspicious osseous lesion. Sinuses/Orbits: Postsurgical changes in the paranasal sinuses including right  maxillary antrostomy. Mild right maxillary sinus mucosal thickening. Clear mastoid air cells. Unremarkable orbits. Other: None. IMPRESSION: Unremarkable CT appearance of the brain for  age. No evidence of acute abnormality or metastatic disease. Electronically Signed   By: Logan Bores M.D.   On: 05/07/2018 14:07   Dg Esophagus  Result Date: 05/08/2018 CLINICAL DATA:  Aspiration into airway EXAM: ESOPHOGRAM/BARIUM SWALLOW TECHNIQUE: Single contrast examination was performed using thin barium or water soluble. FLUOROSCOPY TIME:  Fluoroscopy Time:  1.3 minutes Radiation Exposure Index (if provided by the fluoroscopic device): 9.4 mGy Number of Acquired Spot Images: 0 COMPARISON:  Chest CT 04/13/2018 FINDINGS: Limited study. Double contrast and recumbent motility imaging was not considered safe given the degree of stasis described below. Oblique pharyngeal imaging was negative for aspiration, stasis, or collection. Near full column stasis of thin liquid above a smoothly narrowed GE junction. No specific esophageal dysmotility, including aperistalsis. A 13 mm barium tablet was provided to the patient, but despite multiple attempts she could not pass it be on the mouth. Negative for hiatal hernia. IMPRESSION: 1. Smooth stricture at the GE junction with full column stasis that would slowly drain. A full esophagus was correlated with the patient's episodes of gagging. Despite multiple attempts, the patient could not swallow a 13 mm barium tablet. 2. Negative for aspiration. Electronically Signed   By: Monte Fantasia M.D.   On: 05/08/2018 17:12   Dg Chest Port 1 View  Result Date: 05/04/2018 CLINICAL DATA:  Cough and dyspnea EXAM: PORTABLE CHEST 1 VIEW COMPARISON:  CXR 04/08/2018 and CT 04/13/2018 FINDINGS: Stable cardiomegaly with aortic atherosclerosis. Aortic valvular replacement with median sternotomy sutures are noted. Left-sided ICD device with leads in the right atrium, coronary sinus and right ventricle as  before. Edema and/or thickening along the right minor fissure. Coarsened interstitial lung markings with slightly more confluent airspace opacities in the right upper and middle lobes may reflect evolving pneumonia or postobstructive change. Right hilar adenopathy as seen on prior study is less apparent possibly from superimposition of the cardiac silhouette over the hila. No aggressive osseous abnormality. IMPRESSION: 1. New subpleural areas of airspace opacity in the right upper and middle lobe distribution with coarsened interstitial lung markings. Right upper and middle lobe pneumonia or evolving postobstructive change from known mediastinal and hilar adenopathy are possibilities. Follow-up in 4-6 weeks after treating for pneumonia is recommended. 2. Stable cardiomegaly with aortic atherosclerosis. 3. Status post aortic valvular replacement.  ICD device in place. Electronically Signed   By: Ashley Royalty M.D.   On: 05/04/2018 20:45    Lab Data:  CBC: Recent Labs  Lab 05/04/18 2031 05/05/18 0244 05/06/18 0458 05/07/18 0354 05/08/18 4315 05/09/18 0443 05/10/18 0441  WBC 19.7* 20.4* 18.4* 17.8* 18.2* 19.5* 19.8*  NEUTROABS 17.7* 18.4*  --   --   --   --   --   HGB 11.2* 10.2* 10.1* 9.4* 10.1* 9.7* 9.2*  HCT 34.7* 31.7* 32.1* 30.7* 33.1* 31.9* 30.3*  MCV 93.8 94.9 97.0 96.8 99.1 97.6 97.1  PLT 431* 431* 406* 400 446* 438* 400*   Basic Metabolic Panel: Recent Labs  Lab 05/05/18 0244 05/06/18 0458 05/07/18 0354 05/08/18 0638 05/09/18 0443 05/10/18 0441  NA 140 144 143 147* 145 145  K 3.2* 2.9* 3.0* 3.6 3.3* 3.1*  CL 93* 94* 90* 92* 91* 94*  CO2 35* 39* 42* 45* 45* 45*  GLUCOSE 268* 281* 190* 164* 169* 87  BUN 21* 16 19 20  21* 16  CREATININE 0.82 0.79 0.74 0.71 0.70 0.59  CALCIUM 9.0 8.3* 8.3* 8.5* 8.4* 8.2*  MG 2.1  --   --   --   --   --  GFR: Estimated Creatinine Clearance: 48.7 mL/min (by C-G formula based on SCr of 0.59 mg/dL). Liver Function Tests: Recent Labs  Lab  05/04/18 2031  AST 37  ALT 47  ALKPHOS 146*  BILITOT 0.4  PROT 6.4*  ALBUMIN 2.3*   No results for input(s): LIPASE, AMYLASE in the last 168 hours. Recent Labs  Lab 05/07/18 1532  AMMONIA 31   Coagulation Profile: No results for input(s): INR, PROTIME in the last 168 hours. Cardiac Enzymes: No results for input(s): CKTOTAL, CKMB, CKMBINDEX, TROPONINI in the last 168 hours. BNP (last 3 results) No results for input(s): PROBNP in the last 8760 hours. HbA1C: No results for input(s): HGBA1C in the last 72 hours. CBG: Recent Labs  Lab 05/09/18 1203 05/09/18 1656 05/09/18 2118 05/10/18 0756 05/10/18 0834  GLUCAP 97 150* 155* 66 88   Lipid Profile: No results for input(s): CHOL, HDL, LDLCALC, TRIG, CHOLHDL, LDLDIRECT in the last 72 hours. Thyroid Function Tests: Recent Labs    05/07/18 2001 05/08/18 0638  TSH  --  <0.010*  FREET4 1.63  --    Anemia Panel: Recent Labs    05/07/18 1532  VITAMINB12 1,124*   Urine analysis:    Component Value Date/Time   COLORURINE YELLOW 05/05/2018 0537   APPEARANCEUR CLEAR 05/05/2018 0537   LABSPEC 1.014 05/05/2018 0537   PHURINE 6.0 05/05/2018 0537   GLUCOSEU 50 (A) 05/05/2018 0537   HGBUR SMALL (A) 05/05/2018 0537   BILIRUBINUR NEGATIVE 05/05/2018 0537   KETONESUR NEGATIVE 05/05/2018 0537   PROTEINUR 30 (A) 05/05/2018 0537   UROBILINOGEN 0.2 01/11/2015 0906   NITRITE NEGATIVE 05/05/2018 0537   LEUKOCYTESUR NEGATIVE 05/05/2018 0537     Johan Antonacci M.D. [facility] 05/10/2018, 11:04 AM  Pager: [phone] Between 7am to 7pm - call Pager - [phone]  After 7pm go to [email or web address] - password [email or web address]  Call night coverage person covering after 7pm

## 2018-05-10 NOTE — Progress Notes (Signed)
Hypoglycemic Event  CBG: 66  Treatment: 15 GM carbohydrate snack  Symptoms: None  Follow-up CBG: [id number]    CBG Result:88  Possible Reasons for Event: Unknown      Luci Bank

## 2018-05-11 LAB — CBC
HCT: 32.2 % — ABNORMAL LOW (ref 36.0–46.0)
Hemoglobin: 9.7 g/dL — ABNORMAL LOW (ref 12.0–15.0)
MCH: 29.8 pg (ref 26.0–34.0)
MCHC: 30.1 g/dL (ref 30.0–36.0)
MCV: 99.1 fL (ref 78.0–100.0)
PLATELETS: 473 10*3/uL — AB (ref 150–400)
RBC: 3.25 MIL/uL — ABNORMAL LOW (ref 3.87–5.11)
RDW: 14.3 % (ref 11.5–15.5)
WBC: 20.6 10*3/uL — ABNORMAL HIGH (ref 4.0–10.5)

## 2018-05-11 LAB — GLUCOSE, CAPILLARY
Glucose-Capillary: 176 mg/dL — ABNORMAL HIGH (ref 65–99)
Glucose-Capillary: 181 mg/dL — ABNORMAL HIGH (ref 65–99)
Glucose-Capillary: 224 mg/dL — ABNORMAL HIGH (ref 65–99)
Glucose-Capillary: 336 mg/dL — ABNORMAL HIGH (ref 65–99)

## 2018-05-11 LAB — BASIC METABOLIC PANEL
Anion gap: 6 (ref 5–15)
BUN: 13 mg/dL (ref 6–20)
CHLORIDE: 95 mmol/L — AB (ref 101–111)
CO2: 43 mmol/L — AB (ref 22–32)
CREATININE: 0.67 mg/dL (ref 0.44–1.00)
Calcium: 8.5 mg/dL — ABNORMAL LOW (ref 8.9–10.3)
GFR calc Af Amer: >60 mL/min (ref >60)
GFR calc non Af Amer: >60 mL/min (ref >60)
Glucose, Bld: 179 mg/dL — ABNORMAL HIGH (ref 65–99)
Potassium: 4.2 mmol/L (ref 3.5–5.1)
SODIUM: 144 mmol/L (ref 135–145)

## 2018-05-11 NOTE — Progress Notes (Signed)
Patient and spouse request [facility]. Will have them review referral.   Cedric Fishman LCSW [phone]

## 2018-05-11 NOTE — Progress Notes (Signed)
  Speech Language Pathology Treatment: Dysphagia  Patient Details Name: Carol Warren MRN: [id number] DOB: 1946/10/04 Today's Date: 05/11/2018 Time: 1245-1300 SLP Time Calculation (min) (ACUTE ONLY): 15 min  Assessment / Plan / Recommendation Clinical Impression  Patient has been tolerating Dys 1, thin liquids. GI had been consulted for EGD to evaluate esophageal stricture and GI also upgraded patients diet to a Soft diet , thin liquids. Patient able to verbalize aspiration precautions and need to use suction and yonker to suction oral cavity. Continue to monitor for diet tolerance.   HPI HPI: 72 yo female smoker with dyspnea, cough, fever, chills from PNA, COPD, and lung mass.  PMHx of CAD, HTN, HFpEF, s/p AICD, Depression, GERD, Pernicious anemia      SLP Plan  Continue with current plan of care       Recommendations  Diet recommendations: Other(comment)(GI has recommended Soft diet with thin liquids) Liquids provided via: Cup;Straw Medication Administration: Crushed with puree Supervision: Patient able to self feed Compensations: Slow rate;Small sips/bites;Follow solids with liquid;Minimize environmental distractions Postural Changes and/or Swallow Maneuvers: Upright 30-60 min after meal;Seated upright 90 degrees                Plan: Continue with current plan of care       Hopkins, MA, CCC-SLP 05/11/2018 1:15 PM

## 2018-05-11 NOTE — Progress Notes (Signed)
[facility]                                                                              Patient Demographics  Carol Warren, is a 72 y.o. female, DOB - 28-Jun-1946, VOZ:[id number]  Admit date - 05/04/2018   Admitting Physician Vianne Bulls, MD  Outpatient Primary MD for the patient is Ernestene Kiel, MD  Outpatient specialists:   LOS - 6  days   Medical records reviewed and are as summarized below:    Chief Complaint  Patient presents with  . Shortness of Breath       Brief summary   [name] is a 72 year old female with history of COPD, ongoing tobacco abuse, chronic diastolic and prior systolic CHF admitted with productive cough, fevers and chills and shortness of breath. Of note, she had an outpatient CT scan done by her PCP last month in [place], Dr.Plotnov which was concerning for widespread chest adenopathy and bilateral adrenal nodules concerning for possible advanced lung cancer. She was referred to pulmonary Dr. Melvyn Novas and has of upcoming appointment soon on 5/9  Assessment & Plan    Principal Problem:   Community acquired pneumonia versus postobstructive -Blood cultures negative so far, also has aspiration component due to esophageal stricture -WBC count 20.6 today. -Leukocytosis worsening possibly due to steroids, no fevers, on IV Unasyn.  Steroids discontinued on 5/12. Blood cultures 5/6 negative so far  Active Problems:   COPD Exacerbation GOLD 0/ still smoking  -No wheezing today, continue nebs, O2, steroids discontinued.  Abnormal recent CT chest concerning for metastatic disease -Patient CT performed last month per admission H&P, showed adenopathy in the chest, bilateral axilla, bilateral adrenal masses concerning for metastatic disease -Pulmonology, Dr. Halford Chessman was consulted, appreciate recommendations, outpatient PET/CT and further management  Aspiration/dysphagia -Esophagogram shows distal esophageal stricture,  -Appreciate speech  therapy recommendation, diet full liquids.  Awaiting for GI recommendations regarding endoscopy.  Acute metabolic encephalopathy: -Hypercapnia, compensated due to severe COPD, nicotine abuse, BiPAP as needed -TSH low, normal T4, low T3, possibly resolving thyroiditis, repeat TFTs in 1 month  Acute on chronic diastolic CHF (congestive heart failure) ([place]) 2D echo 07/2017 showed 42%, grade 2 diastolic dysfunction Continue to hold Lasix until patient is able to tolerate diet.    Diabetes mellitus, type II uncontrolled.  Now with hypoglycemia Hemoglobin A1c 8.9 -Lantus, meal coverage NovoLog discontinued due to hypoglycemia.   -continue sliding scale insulin  Nicotine abuse - Placed on nicotine patch   Hypokalemia Resolved  Code Status: Full CODE STATUS DVT Prophylaxis:  Lovenox Family Communication: Discussed in detail with the patient, all imaging results, lab results explained to the patient    Disposition Plan: After endoscopy  Time Spent in minutes 25 minutes  Procedures:  None  Consultants:   Pulmonology GI  Antimicrobials:   IV Zosyn 5/11   Medications  Scheduled Meds: . aspirin  325 mg Oral Daily  . benzonatate  100 mg Oral TID  . carvedilol  18.75 mg Oral BID WC  . enoxaparin (LOVENOX) injection  40 mg Subcutaneous Q24H  . FLUoxetine  20 mg Oral Daily  . gabapentin  100 mg Oral TID  . guaiFENesin  600 mg Oral BID  . insulin aspart  0-5 Units Subcutaneous QHS  . insulin aspart  0-9 Units Subcutaneous TID WC  . ipratropium-albuterol  3 mL Nebulization TID  . losartan  50 mg Oral Daily  . mouth rinse  15 mL Mouth Rinse BID  . montelukast  10 mg Oral QHS  . multivitamin with minerals  1 tablet Oral Daily  . nicotine  21 mg Transdermal Daily  . sodium chloride flush  3 mL Intravenous Q12H  . sodium chloride flush  3 mL Intravenous Q12H   Continuous Infusions: . sodium chloride    . ampicillin-sulbactam (UNASYN) IV 1.5 g (05/11/18 1228)   PRN  Meds:.sodium chloride, acetaminophen **OR** acetaminophen, albuterol, bisacodyl, guaiFENesin-dextromethorphan, HYDROcodone-acetaminophen, ondansetron **OR** ondansetron (ZOFRAN) IV, polyvinyl alcohol, senna-docusate, sodium chloride flush   Antibiotics   Anti-infectives (From admission, onward)   Start     Dose/Rate Route Frequency Ordered Stop   05/09/18 1200  piperacillin-tazobactam (ZOSYN) IVPB 3.375 g  Status:  Discontinued     3.375 g 12.5 mL/hr over 240 Minutes Intravenous Every 8 hours 05/09/18 1125 05/09/18 1139   05/09/18 1200  ampicillin-sulbactam (UNASYN) 1.5 g in sodium chloride 0.9 % 100 mL IVPB     1.5 g 200 mL/hr over 30 Minutes Intravenous Every 6 hours 05/09/18 1139     05/05/18 2200  cefTRIAXone (ROCEPHIN) 1 g in sodium chloride 0.9 % 100 mL IVPB  Status:  Discontinued     1 g 200 mL/hr over 30 Minutes Intravenous Every 24 hours 05/05/18 0111 05/09/18 1103   05/05/18 2200  azithromycin (ZITHROMAX) 500 mg in sodium chloride 0.9 % 250 mL IVPB  Status:  Discontinued     500 mg 250 mL/hr over 60 Minutes Intravenous Every 24 hours 05/05/18 0111 05/08/18 0913   05/04/18 2215  cefTRIAXone (ROCEPHIN) 1 g in sodium chloride 0.9 % 100 mL IVPB     1 g 200 mL/hr over 30 Minutes Intravenous  Once 05/04/18 2213 05/05/18 0007   05/04/18 2215  azithromycin (ZITHROMAX) tablet 500 mg     500 mg Oral  Once 05/04/18 2213 05/04/18 2316        Subjective:   Carol Warren was seen and examined today.  Denies any specific complaints, no significant wheezing, no fevers or chills.  Patient denies any dizziness, abdominal pain, N/V/D/C, new weakness, numbess, tingling. No acute events overnight.    Objective:   Vitals:   05/11/18 0514 05/11/18 0829 05/11/18 0900 05/11/18 1223  BP: (!) 162/98  128/66 135/79  Pulse: 97  90 74  Resp: 18   17  Temp: 97.7 F (36.5 C)   98.3 F (36.8 C)  TempSrc:    Oral  SpO2: 100% 98%  100%  Weight:      Height:       No intake or output data in the 24  hours ending 05/11/18 1244   Wt Readings from Last 3 Encounters:  05/11/18 52.2 kg (115 lb)  03/19/18 56.2 kg (124 lb)  12/03/17 56.2 kg (124 lb)     Exam     General: Sleepy, appears frail and deconditioned  Eyes:,  HEENT:  Cardiovascular: S1 S2 auscultated, Regular rate and rhythm. No pedal edema b/l  Respiratory: Clear to auscultation bilaterally, no wheezing, rales or rhonchi  Gastrointestinal: Soft, nontender, nondistended, + bowel sounds  Ext: no pedal edema bilaterally  Neuro: no new deficit  Musculoskeletal: No digital cyanosis, clubbing  Skin: No rashes  Psych no acute issues, sleepy    Data Reviewed:  I have personally reviewed following labs and imaging studies  Micro Results Recent Results (from the past 240 hour(s))  Blood Culture (routine x 2)     Status: None   Collection Time: 05/04/18 10:44 PM  Result Value Ref Range Status   Specimen Description BLOOD RIGHT ANTECUBITAL  Final   Special Requests   Final    BOTTLES DRAWN AEROBIC AND ANAEROBIC Blood Culture adequate volume   Culture   Final    NO GROWTH 5 DAYS Performed at [facility], [address], [place], [place] [address]    Report Status 05/09/2018 FINAL  Final  Culture, sputum-assessment     Status: None   Collection Time: 05/07/18  5:07 AM  Result Value Ref Range Status   Specimen Description EXPECTORATED SPUTUM  Final   Special Requests   Final    NONE Performed at [facility], [place] [address], [place], [place] [address]    Sputum evaluation THIS SPECIMEN IS ACCEPTABLE FOR SPUTUM CULTURE  Final   Report Status 05/07/2018 FINAL  Final  Culture, respiratory (NON-Expectorated)     Status: None   Collection Time: 05/07/18  5:07 AM  Result Value Ref Range Status   Specimen Description EXPECTORATED SPUTUM  Final   Special Requests NONE Reflexed from [id number]  Final   Gram Stain   Final    FEW WBC PRESENT, PREDOMINANTLY PMN FEW GRAM POSITIVE COCCI RARE GRAM NEGATIVE  RODS RARE GRAM POSITIVE RODS    Culture   Final    FEW Consistent with normal respiratory flora. Performed at [facility], [place] [address], [place], [place] [address]    Report Status 05/09/2018 FINAL  Final    Radiology Reports Dg Chest 2 View  Result Date: 05/08/2018 CLINICAL DATA:  Shortness of breath, cough. EXAM: CHEST - 2 VIEW COMPARISON:  Radiograph of May 04, 2018.  CT scan of April 13, 2018. FINDINGS: Stable cardiomegaly. Right hilar prominence is again noted consistent with adenopathy. Stable right upper lobe opacity is noted concerning for atelectasis or possibly pneumonia. Bony thorax is unremarkable. Left-sided pacemaker is unchanged in position. Status post aortic valve repair. Left lung is clear. Stable right upper lobe airspace opacity is noted. IMPRESSION: Stable right hilar prominence concerning for adenopathy. Stable right upper lobe airspace opacity is noted concerning for postobstructive atelectasis or possibly inflammation. Electronically Signed   By: Marijo Conception, M.D.   On: 05/08/2018 12:52   Ct Head W & Wo Contrast  Result Date: 05/07/2018 CLINICAL DATA:  New onset altered level of consciousness. Lung mass with metastases. Evaluate for brain metastases. EXAM: CT HEAD WITHOUT AND WITH CONTRAST TECHNIQUE: Contiguous axial images were obtained from the base of the skull through the vertex without and with intravenous contrast CONTRAST:  73mL OMNIPAQUE IOHEXOL 300 MG/ML  SOLN COMPARISON:  Sinus CT 04/13/2018.  No prior brain imaging. FINDINGS: Brain: There is no evidence of acute infarct, intracranial hemorrhage, mass, midline shift, or extra-axial fluid collection. Mild cerebral atrophy is not greater than expected for patient's age. No abnormal enhancement is identified. Vascular: Mild calcified atherosclerosis at the skull base. Major dural venous sinuses are grossly patent. Skull: No fracture or suspicious osseous lesion. Sinuses/Orbits: Postsurgical changes in the  paranasal sinuses including right maxillary antrostomy. Mild right maxillary sinus mucosal thickening. Clear mastoid air cells. Unremarkable orbits. Other: None. IMPRESSION: Unremarkable CT appearance of the brain  for age. No evidence of acute abnormality or metastatic disease. Electronically Signed   By: Logan Bores M.D.   On: 05/07/2018 14:07   Dg Esophagus  Result Date: 05/08/2018 CLINICAL DATA:  Aspiration into airway EXAM: ESOPHOGRAM/BARIUM SWALLOW TECHNIQUE: Single contrast examination was performed using thin barium or water soluble. FLUOROSCOPY TIME:  Fluoroscopy Time:  1.3 minutes Radiation Exposure Index (if provided by the fluoroscopic device): 9.4 mGy Number of Acquired Spot Images: 0 COMPARISON:  Chest CT 04/13/2018 FINDINGS: Limited study. Double contrast and recumbent motility imaging was not considered safe given the degree of stasis described below. Oblique pharyngeal imaging was negative for aspiration, stasis, or collection. Near full column stasis of thin liquid above a smoothly narrowed GE junction. No specific esophageal dysmotility, including aperistalsis. A 13 mm barium tablet was provided to the patient, but despite multiple attempts she could not pass it be on the mouth. Negative for hiatal hernia. IMPRESSION: 1. Smooth stricture at the GE junction with full column stasis that would slowly drain. A full esophagus was correlated with the patient's episodes of gagging. Despite multiple attempts, the patient could not swallow a 13 mm barium tablet. 2. Negative for aspiration. Electronically Signed   By: Monte Fantasia M.D.   On: 05/08/2018 17:12   Dg Chest Port 1 View  Result Date: 05/04/2018 CLINICAL DATA:  Cough and dyspnea EXAM: PORTABLE CHEST 1 VIEW COMPARISON:  CXR 04/08/2018 and CT 04/13/2018 FINDINGS: Stable cardiomegaly with aortic atherosclerosis. Aortic valvular replacement with median sternotomy sutures are noted. Left-sided ICD device with leads in the right atrium,  coronary sinus and right ventricle as before. Edema and/or thickening along the right minor fissure. Coarsened interstitial lung markings with slightly more confluent airspace opacities in the right upper and middle lobes may reflect evolving pneumonia or postobstructive change. Right hilar adenopathy as seen on prior study is less apparent possibly from superimposition of the cardiac silhouette over the hila. No aggressive osseous abnormality. IMPRESSION: 1. New subpleural areas of airspace opacity in the right upper and middle lobe distribution with coarsened interstitial lung markings. Right upper and middle lobe pneumonia or evolving postobstructive change from known mediastinal and hilar adenopathy are possibilities. Follow-up in 4-6 weeks after treating for pneumonia is recommended. 2. Stable cardiomegaly with aortic atherosclerosis. 3. Status post aortic valvular replacement.  ICD device in place. Electronically Signed   By: Ashley Royalty M.D.   On: 05/04/2018 20:45    Lab Data:  CBC: Recent Labs  Lab 05/04/18 2031 05/05/18 0244  05/07/18 0354 05/08/18 1740 05/09/18 0443 05/10/18 0441 05/11/18 0609  WBC 19.7* 20.4*   < > 17.8* 18.2* 19.5* 19.8* 20.6*  NEUTROABS 17.7* 18.4*  --   --   --   --   --   --   HGB 11.2* 10.2*   < > 9.4* 10.1* 9.7* 9.2* 9.7*  HCT 34.7* 31.7*   < > 30.7* 33.1* 31.9* 30.3* 32.2*  MCV 93.8 94.9   < > 96.8 99.1 97.6 97.1 99.1  PLT 431* 431*   < > 400 446* 438* 425* 473*   < > = values in this interval not displayed.   Basic Metabolic Panel: Recent Labs  Lab 05/05/18 0244  05/07/18 0354 05/08/18 8144 05/09/18 0443 05/10/18 0441 05/11/18 0609  NA 140   < > 143 147* 145 145 144  K 3.2*   < > 3.0* 3.6 3.3* 3.1* 4.2  CL 93*   < > 90* 92* 91* 94* 95*  CO2 35*   < >  42* 45* 45* 45* 43*  GLUCOSE 268*   < > 190* 164* 169* 87 179*  BUN 21*   < > 19 20 21* 16 13  CREATININE 0.82   < > 0.74 0.71 0.70 0.59 0.67  CALCIUM 9.0   < > 8.3* 8.5* 8.4* 8.2* 8.5*  MG 2.1   --   --   --   --   --   --    < > = values in this interval not displayed.   GFR: Estimated Creatinine Clearance: 48.7 mL/min (by C-G formula based on SCr of 0.67 mg/dL). Liver Function Tests: Recent Labs  Lab 05/04/18 2031  AST 37  ALT 47  ALKPHOS 146*  BILITOT 0.4  PROT 6.4*  ALBUMIN 2.3*   No results for input(s): LIPASE, AMYLASE in the last 168 hours. Recent Labs  Lab 05/07/18 1532  AMMONIA 31   Coagulation Profile: No results for input(s): INR, PROTIME in the last 168 hours. Cardiac Enzymes: No results for input(s): CKTOTAL, CKMB, CKMBINDEX, TROPONINI in the last 168 hours. BNP (last 3 results) No results for input(s): PROBNP in the last 8760 hours. HbA1C: No results for input(s): HGBA1C in the last 72 hours. CBG: Recent Labs  Lab 05/10/18 0834 05/10/18 1240 05/10/18 1744 05/10/18 2122 05/11/18 0745  GLUCAP 88 117* 141* 183* 176*   Lipid Profile: No results for input(s): CHOL, HDL, LDLCALC, TRIG, CHOLHDL, LDLDIRECT in the last 72 hours. Thyroid Function Tests: No results for input(s): TSH, T4TOTAL, FREET4, T3FREE, THYROIDAB in the last 72 hours. Anemia Panel: No results for input(s): VITAMINB12, FOLATE, FERRITIN, TIBC, IRON, RETICCTPCT in the last 72 hours. Urine analysis:    Component Value Date/Time   COLORURINE YELLOW 05/05/2018 [place] 05/05/2018 0537   LABSPEC 1.014 05/05/2018 0537   PHURINE 6.0 05/05/2018 0537   GLUCOSEU 50 (A) 05/05/2018 0537   HGBUR SMALL (A) 05/05/2018 0537   BILIRUBINUR NEGATIVE 05/05/2018 0537   KETONESUR NEGATIVE 05/05/2018 0537   PROTEINUR 30 (A) 05/05/2018 0537   UROBILINOGEN 0.2 01/11/2015 0906   NITRITE NEGATIVE 05/05/2018 0537   LEUKOCYTESUR NEGATIVE 05/05/2018 0537     Ripudeep Rai M.D. [facility] 05/11/2018, 12:44 PM  Pager: [phone] Between 7am to 7pm - call Pager - [phone]  After 7pm go to [email or web address] - password [email or web address]  Call night coverage person covering after 7pm

## 2018-05-11 NOTE — Progress Notes (Signed)
Physical Therapy Treatment Patient Details Name: Carol Warren MRN: [id number] DOB: Mar 19, 1946 Today's Date: 05/11/2018    History of Present Illness 72 year old female with history of COPD, ongoing tobacco abuse, lumbar fusion L3-4 and L4-5 0109, chronic diastolic and prior systolic CHF admitted with productive cough, fevers and chills and shortness of breath. Of note, she had an outpatient CT scan done by her PCP last month in [place], Dr.Plotnov which was concerning for widespread chest adenopathy and bilateral adrenal nodules concerning for possible advanced lung cancer.    PT Comments    Patient seen for activity progression. Tolerated increased ambulation today but continues to required hand on assist for all aspects. SNF remains appropriate.Alben Deeds, PT DPT  Board Certified Neurologic Specialist [phone]    Follow Up Recommendations  SNF;Supervision/Assistance - 24 hour;Supervision for mobility/OOB     Equipment Recommendations  Wheelchair (measurements PT);Wheelchair cushion (measurements PT)    Recommendations for Other Services       Precautions / Restrictions Precautions Precautions: Fall Required Braces or Orthoses: Spinal Brace(fusion in 2017-to wear brace when up) Spinal Brace: Thoracolumbosacral orthotic Restrictions Weight Bearing Restrictions: No    Mobility  Bed Mobility            General bed mobility comments: received in chair  Transfers Overall transfer level: Needs assistance Equipment used: 1 person hand held assist Transfers: Sit to/from Stand Sit to Stand: Min assist         General transfer comment: VCs for hand placement during elevation to upright, min assist for stability  Ambulation/Gait Ambulation/Gait assistance: Min assist;Mod assist Ambulation Distance (Feet): 50 Feet Assistive device: 1 person hand held assist Gait Pattern/deviations: Step-through pattern;Decreased stride length Gait velocity: decreased Gait  velocity interpretation: <1.8 ft/sec, indicate of risk for recurrent falls General Gait Details: min to moderate assist for stability during ambulation to hall. ambulated on 3 liters saturations stable. multiple posterior LOB during ambulation   Stairs             Wheelchair Mobility    Modified Rankin (Stroke Patients Only)       Balance Overall balance assessment: Needs assistance;History of Falls Sitting-balance support: Feet supported Sitting balance-Leahy Scale: Fair   Postural control: Posterior lean Standing balance support: No upper extremity supported;During functional activity Standing balance-Leahy Scale: Poor Standing balance comment: assist for stability during functional task (hygiene and pericare). continues to lose balance posteriorly                            Cognition Arousal/Alertness: Awake/alert Behavior During Therapy: WFL for tasks assessed/performed Overall Cognitive Status: No family/caregiver present to determine baseline cognitive functioning                                        Exercises      General Comments        Pertinent Vitals/Pain Pain Assessment: Faces Faces Pain Scale: Hurts whole lot Pain Location: back Pain Descriptors / Indicators: Aching;Guarding Pain Intervention(s): Monitored during session    Home Living                      Prior Function            PT Goals (current goals can now be found in the care plan section) Acute Rehab PT Goals Patient Stated Goal: not stated PT  Goal Formulation: With patient/family Time For Goal Achievement: 05/22/18 Potential to Achieve Goals: Fair Progress towards PT goals: Progressing toward goals    Frequency    Min 3X/week      PT Plan Current plan remains appropriate    Co-evaluation              AM-PAC PT "6 Clicks" Daily Activity  Outcome Measure  Difficulty turning over in bed (including adjusting bedclothes,  sheets and blankets)?: Unable Difficulty moving from lying on back to sitting on the side of the bed? : Unable Difficulty sitting down on and standing up from a chair with arms (e.g., wheelchair, bedside commode, etc,.)?: Unable Help needed moving to and from a bed to chair (including a wheelchair)?: A Little Help needed walking in hospital room?: A Lot Help needed climbing 3-5 steps with a railing? : A Lot 6 Click Score: 10    End of Session Equipment Utilized During Treatment: Gait belt;Oxygen;Other (comment)(back brace) Activity Tolerance: Patient limited by fatigue Patient left: in chair;with family/visitor present;with call bell/phone within reach Nurse Communication: Mobility status PT Visit Diagnosis: Unsteadiness on feet (R26.81);History of falling (Z91.81);Muscle weakness (generalized) (M62.81);Difficulty in walking, not elsewhere classified (R26.2)     Time: 0600-4599 PT Time Calculation (min) (ACUTE ONLY): 20 min  Charges:  $Gait Training: 8-22 mins                    G Codes:       Alben Deeds, PT DPT  Board Certified Neurologic Specialist [place] 05/11/2018, 11:06 AM

## 2018-05-11 NOTE — Progress Notes (Signed)
Pt indicates that she's tolerating her liquids and wants her diet advanced.  As outpt, she repts longstanding intermittent dysphagia to solid foods, not to liquids. No sx to suggest tendency for aspiration.  BaS film reviewed--no evident high-grade stenosis.   I think there is probably some impairment of relaxation of lower esoph sphincter, less likely an esoph stricture.  No evid of esoph mass.  PLAN:  1.  Discussed possible etiologies of dysphagia, and pro's and con's of endoscopic evaluation and/or dilatation, with pt and family.  2.  We agree that pt probably is not sufficiently symptomatic to necessitate egd/dil; moreover, given her respiratory dysfunction, it would be more risky to attempt that at this time.  Therefore, we will not proceed w/ egd for now--nor likely in the future, either, unless our hand is forced by inability to eat.  3.  Have advanced pt to soft diet---may advance further if tolerated.  4. Will sign off; please call us if we can be of further assistance with this pt, for example, if she doesn't tolerate dietary advancement.  Cleotis Nipper, M.D. Pager [phone] If no answer or after 5 PM call [phone]

## 2018-05-11 NOTE — Progress Notes (Signed)
Occupational Therapy Treatment Patient Details Name: Carol Warren MRN: [id number] DOB: 12/24/1946 Today's Date: 05/11/2018    History of present illness 72 year old female with history of COPD, ongoing tobacco abuse, lumbar fusion L3-4 and L4-5 2426, chronic diastolic and prior systolic CHF admitted with productive cough, fevers and chills and shortness of breath. Of note, she had an outpatient CT scan done by her PCP last month in [place], Dr.Plotnov which was concerning for widespread chest adenopathy and bilateral adrenal nodules concerning for possible advanced lung cancer.   OT comments  Pt continues to demonstrate unsteadiness in standing and with mobility requiring at least min assist for all standing activity. During grooming tasks at the sink pt loosing balance posteriorly multiple times requiring min assist to correct. Pt reports her current mobility is close to her baseline until she "gets going" in the morning. Today, her biggest limiting factor is back pain. D/c plan remains appropriate. Will continue to follow acutely.   Follow Up Recommendations  SNF;Supervision/Assistance - 24 hour    Equipment Recommendations  Other (comment)(TBD at next venue)    Recommendations for Other Services      Precautions / Restrictions Precautions Precautions: Fall Restrictions Weight Bearing Restrictions: No       Mobility Bed Mobility Overal bed mobility: Needs Assistance Bed Mobility: Supine to Sit     Supine to sit: Min assist     General bed mobility comments: Min HHA to boost trunk into sitting. Use of bed rails to assist with transitional movements  Transfers Overall transfer level: Needs assistance Equipment used: 1 person hand held assist Transfers: Sit to/from Stand Sit to Stand: Min assist         General transfer comment: Min assist for steadying balance. Pt in low bed so elevated some    Balance Overall balance assessment: Needs assistance;History of  Falls Sitting-balance support: Feet supported Sitting balance-Leahy Scale: Fair   Postural control: Posterior lean Standing balance support: No upper extremity supported;During functional activity Standing balance-Leahy Scale: Poor Standing balance comment: Min assist for standing balance at sink while completing grooming tasks. Posterior lean in standing                           ADL either performed or assessed with clinical judgement   ADL Overall ADL's : Needs assistance/impaired     Grooming: Minimal assistance;Standing;Oral care;Brushing hair Grooming Details (indicate cue type and reason): Pt with posterior lean in standing during bil UE functional tasks                 Toilet Transfer: Minimal assistance;Ambulation(HHA) Toilet Transfer Details (indicate cue type and reason): Simulated by sit to stand from EOB with functional mobility in room         Functional mobility during ADLs: Minimal assistance(HHA) General ADL Comments: Pt reports her current level of mobility is typical for her at home before she "gets going". Pt reports she frequently furniture walks at home     Vision       Perception     Praxis      Cognition Arousal/Alertness: Awake/alert Behavior During Therapy: [facility] for tasks assessed/performed Overall Cognitive Status: No family/caregiver present to determine baseline cognitive functioning                                          Exercises  Shoulder Instructions       General Comments      Pertinent Vitals/ Pain       Pain Assessment: Faces Faces Pain Scale: Hurts whole lot Pain Location: back Pain Descriptors / Indicators: Aching;Grimacing;Guarding Pain Intervention(s): Monitored during session;Repositioned  Home Living                                          Prior Functioning/Environment              Frequency  Min 2X/week        Progress Toward Goals  OT  Goals(current goals can now be found in the care plan section)  Progress towards OT goals: Progressing toward goals  Acute Rehab OT Goals Patient Stated Goal: not stated OT Goal Formulation: With patient  Plan Discharge plan remains appropriate    Co-evaluation                 AM-PAC PT "6 Clicks" Daily Activity     Outcome Measure   Help from another person eating meals?: A Little Help from another person taking care of personal grooming?: A Little Help from another person toileting, which includes using toliet, bedpan, or urinal?: A Lot Help from another person bathing (including washing, rinsing, drying)?: A Lot Help from another person to put on and taking off regular upper body clothing?: A Little Help from another person to put on and taking off regular lower body clothing?: A Lot 6 Click Score: 15    End of Session Equipment Utilized During Treatment: Gait belt;Oxygen  OT Visit Diagnosis: Unsteadiness on feet (R26.81)   Activity Tolerance Patient tolerated treatment well   Patient Left in chair;with call bell/phone within reach;with chair alarm set;with family/visitor present   Nurse Communication Mobility status        Time: 3903-0092 OT Time Calculation (min): 21 min  Charges: OT General Charges $OT Visit: 1 Visit OT Treatments $Self Care/Home Management : 8-22 mins  Cristo Ausburn A. Ulice Brilliant, M.S., OTR/L Acute Rehab Department: [phone]   Binnie Kand 05/11/2018, 8:19 AM

## 2018-05-12 LAB — CBC
HEMATOCRIT: 29.9 % — AB (ref 36.0–46.0)
Hemoglobin: 9 g/dL — ABNORMAL LOW (ref 12.0–15.0)
MCH: 29.5 pg (ref 26.0–34.0)
MCHC: 30.1 g/dL (ref 30.0–36.0)
MCV: 98 fL (ref 78.0–100.0)
PLATELETS: 441 10*3/uL — AB (ref 150–400)
RBC: 3.05 MIL/uL — ABNORMAL LOW (ref 3.87–5.11)
RDW: 14.3 % (ref 11.5–15.5)
WBC: 19.7 10*3/uL — AB (ref 4.0–10.5)

## 2018-05-12 LAB — GLUCOSE, CAPILLARY
GLUCOSE-CAPILLARY: 216 mg/dL — AB (ref 65–99)
GLUCOSE-CAPILLARY: 270 mg/dL — AB (ref 65–99)
GLUCOSE-CAPILLARY: 150 mg/dL — AB (ref 65–99)
Glucose-Capillary: 329 mg/dL — ABNORMAL HIGH (ref 65–99)

## 2018-05-12 LAB — BASIC METABOLIC PANEL
Anion gap: 8 (ref 5–15)
BUN: 13 mg/dL (ref 6–20)
CALCIUM: 8.2 mg/dL — AB (ref 8.9–10.3)
CO2: 41 mmol/L — ABNORMAL HIGH (ref 22–32)
CREATININE: 0.63 mg/dL (ref 0.44–1.00)
Chloride: 93 mmol/L — ABNORMAL LOW (ref 101–111)
GFR calc Af Amer: >60 mL/min (ref >60)
GLUCOSE: 228 mg/dL — AB (ref 65–99)
Potassium: 4.4 mmol/L (ref 3.5–5.1)
SODIUM: 142 mmol/L (ref 135–145)

## 2018-05-12 MED ORDER — INSULIN ASPART 100 UNIT/ML ~~LOC~~ SOLN
3.0000 [IU] | Freq: Three times a day (TID) | SUBCUTANEOUS | Status: DC
  Administered 2018-05-12 – 2018-05-14 (×3): 3 [IU] via SUBCUTANEOUS

## 2018-05-12 MED ORDER — GUAIFENESIN ER 600 MG PO TB12: 600.0000 mg | ORAL_TABLET | Freq: Two times a day (BID) | ORAL | Status: AC

## 2018-05-12 MED ORDER — BENZONATATE 100 MG PO CAPS: 100.0000 mg | ORAL_CAPSULE | Freq: Three times a day (TID) | ORAL | 0 refills | Status: AC | PRN

## 2018-05-12 MED ORDER — PANTOPRAZOLE SODIUM 40 MG PO TBEC
40.0000 mg | DELAYED_RELEASE_TABLET | Freq: Every day | ORAL | Status: DC
  Administered 2018-05-12 – 2018-05-14 (×3): 40 mg via ORAL
  Filled 2018-05-12 (×3): qty 1

## 2018-05-12 MED ORDER — GUAIFENESIN-DM 100-10 MG/5ML PO SYRP: 5.0000 mL | ORAL_SOLUTION | ORAL | 0 refills | Status: AC | PRN

## 2018-05-12 MED ORDER — AMOXICILLIN-POT CLAVULANATE 875-125 MG PO TABS: 1.0000 | ORAL_TABLET | Freq: Two times a day (BID) | ORAL | Status: DC

## 2018-05-12 MED ORDER — PANTOPRAZOLE SODIUM 40 MG PO TBEC: 40.0000 mg | DELAYED_RELEASE_TABLET | Freq: Every day | ORAL | Status: AC

## 2018-05-12 MED ORDER — AMOXICILLIN-POT CLAVULANATE 875-125 MG PO TABS
1.0000 | ORAL_TABLET | Freq: Two times a day (BID) | ORAL | Status: DC
  Administered 2018-05-12 – 2018-05-13 (×3): 1 via ORAL
  Filled 2018-05-12 (×3): qty 1

## 2018-05-12 MED ORDER — INSULIN GLARGINE 100 UNIT/ML ~~LOC~~ SOLN
5.0000 [IU] | Freq: Every day | SUBCUTANEOUS | Status: DC
  Administered 2018-05-12 – 2018-05-14 (×3): 5 [IU] via SUBCUTANEOUS
  Filled 2018-05-12 (×3): qty 0.05

## 2018-05-12 MED ORDER — NICOTINE 21 MG/24HR TD PT24: 21.0000 mg | MEDICATED_PATCH | Freq: Every day | TRANSDERMAL | 0 refills | Status: AC

## 2018-05-12 MED ORDER — ALBUTEROL SULFATE (2.5 MG/3ML) 0.083% IN NEBU: 2.5000 mg | INHALATION_SOLUTION | Freq: Four times a day (QID) | RESPIRATORY_TRACT | 12 refills | Status: AC | PRN

## 2018-05-12 NOTE — Progress Notes (Signed)
[facility]                                                                              Patient Demographics  Carol Warren, is a 72 y.o. female, DOB - 1946/03/27, AXE:[id number]  Admit date - 05/04/2018   Admitting Physician Vianne Bulls, MD  Outpatient Primary MD for the patient is Ernestene Kiel, MD  Outpatient specialists:   LOS - 7  days   Medical records reviewed and are as summarized below:    Chief Complaint  Patient presents with  . Shortness of Breath       Brief summary   Carol Warren is a 72 year old female with history of COPD, ongoing tobacco abuse, chronic diastolic and prior systolic CHF admitted with productive cough, fevers and chills and shortness of breath. Of note, she had an outpatient CT scan done by her PCP last month in [place], Dr.Plotnov which was concerning for widespread chest adenopathy and bilateral adrenal nodules concerning for possible advanced lung cancer. She was referred to pulmonary Dr. Melvyn Novas and has of upcoming appointment soon on 5/9  Assessment & Plan    Principal Problem:   Community acquired pneumonia versus postobstructive -Blood cultures negative so far, also has aspiration component due to esophageal stricture -Leukocytosis improving, possibly high due to steroids.  Steroids discontinued on 5/12. - Blood cultures 5/6 negative so far -Discontinued IV Unasyn, transition to oral Augmentin  Active Problems:   COPD Exacerbation GOLD 0/ still smoking  -No wheezing, continue nebs, O2   Abnormal recent CT chest concerning for metastatic disease -Patient CT performed last month per admission H&P, showed adenopathy in the chest, bilateral axilla, bilateral adrenal masses concerning for metastatic disease -Pulmonology, Dr. Halford Chessman was consulted, appreciate recommendations, outpatient PET/CT and further management  Aspiration/dysphagia -Esophagogram shows distal esophageal stricture,  -Discussed with Dr Cristina Gong in detail,  plan for EGD in a.m. n.p.o. after midnight  Acute metabolic encephalopathy: -Hypercapnia, compensated due to severe COPD, nicotine abuse, BiPAP as needed -TSH low, normal T4, low T3, possibly resolving thyroiditis, repeat TFTs in 1 month  Acute on chronic diastolic CHF (congestive heart failure) ([place]) 2D echo 07/2017 showed 11%, grade 2 diastolic dysfunction Continue to hold Lasix until patient is able to tolerate diet.    Diabetes mellitus, type II uncontrolled.  With hyper and hypoglycemia  hemoglobin A1c 8.9 -CBGs elevated this morning, Lantus had been held, will restart -continue sliding scale insulin  Nicotine abuse - Placed on nicotine patch    Code Status: Full CODE STATUS DVT Prophylaxis:  Lovenox Family Communication: Discussed in detail with the patient, all imaging results, lab results explained to the patient    Disposition Plan: After endoscopy  Time Spent in minutes 25 minutes  Procedures:  None  Consultants:   Pulmonology GI  Antimicrobials:   IV Unasyn 5/11 to 5/14   Medications  Scheduled Meds: . amoxicillin-clavulanate  1 tablet Oral Q12H  . aspirin  325 mg Oral Daily  . benzonatate  100 mg Oral TID  . carvedilol  18.75 mg Oral BID WC  . enoxaparin (LOVENOX) injection  40 mg Subcutaneous Q24H  . FLUoxetine  20 mg Oral Daily  . gabapentin  100 mg Oral TID  . guaiFENesin  600 mg Oral BID  . insulin aspart  0-5 Units Subcutaneous QHS  . insulin aspart  0-9 Units Subcutaneous TID WC  . ipratropium-albuterol  3 mL Nebulization TID  . losartan  50 mg Oral Daily  . mouth rinse  15 mL Mouth Rinse BID  . montelukast  10 mg Oral QHS  . multivitamin with minerals  1 tablet Oral Daily  . nicotine  21 mg Transdermal Daily  . pantoprazole  40 mg Oral Q0600  . sodium chloride flush  3 mL Intravenous Q12H  . sodium chloride flush  3 mL Intravenous Q12H   Continuous Infusions: . sodium chloride     PRN Meds:.sodium chloride, acetaminophen **OR**  acetaminophen, albuterol, bisacodyl, guaiFENesin-dextromethorphan, HYDROcodone-acetaminophen, ondansetron **OR** ondansetron (ZOFRAN) IV, polyvinyl alcohol, senna-docusate, sodium chloride flush   Antibiotics   Anti-infectives (From admission, onward)   Start     Dose/Rate Route Frequency Ordered Stop   05/12/18 1000  amoxicillin-clavulanate (AUGMENTIN) 875-125 MG per tablet 1 tablet     1 tablet Oral Every 12 hours 05/12/18 0930     05/12/18 0000  amoxicillin-clavulanate (AUGMENTIN) 875-125 MG tablet     1 tablet Oral 2 times daily 05/12/18 0934 05/17/18 2359   05/09/18 1200  piperacillin-tazobactam (ZOSYN) IVPB 3.375 g  Status:  Discontinued     3.375 g 12.5 mL/hr over 240 Minutes Intravenous Every 8 hours 05/09/18 1125 05/09/18 1139   05/09/18 1200  ampicillin-sulbactam (UNASYN) 1.5 g in sodium chloride 0.9 % 100 mL IVPB  Status:  Discontinued     1.5 g 200 mL/hr over 30 Minutes Intravenous Every 6 hours 05/09/18 1139 05/12/18 0930   05/05/18 2200  cefTRIAXone (ROCEPHIN) 1 g in sodium chloride 0.9 % 100 mL IVPB  Status:  Discontinued     1 g 200 mL/hr over 30 Minutes Intravenous Every 24 hours 05/05/18 0111 05/09/18 1103   05/05/18 2200  azithromycin (ZITHROMAX) 500 mg in sodium chloride 0.9 % 250 mL IVPB  Status:  Discontinued     500 mg 250 mL/hr over 60 Minutes Intravenous Every 24 hours 05/05/18 0111 05/08/18 0913   05/04/18 2215  cefTRIAXone (ROCEPHIN) 1 g in sodium chloride 0.9 % 100 mL IVPB     1 g 200 mL/hr over 30 Minutes Intravenous  Once 05/04/18 2213 05/05/18 0007   05/04/18 2215  azithromycin (ZITHROMAX) tablet 500 mg     500 mg Oral  Once 05/04/18 2213 05/04/18 2316        Subjective:   Carol Warren was seen and examined today.  Alert and oriented, feels a lot better today, pleasant and cooperative.  Denies any specific complaints.  No wheezing.  No fevers patient denies any dizziness, abdominal pain, N/V/D/C, new weakness, numbess, tingling. No acute events  overnight.    Objective:   Vitals:   05/12/18 0500 05/12/18 0504 05/12/18 0801 05/12/18 0900  BP:  (!) 141/86  (!) 147/77  Pulse:  90  87  Resp:  20    Temp:  98 F (36.7 C)    TempSrc:  Oral    SpO2:  98% 96%   Weight: 58.3 kg (128 lb 8 oz)     Height:        Intake/Output Summary (Last 24 hours) at 05/12/2018 1213 Last data filed at 05/12/2018 0642 Gross per 24 hour  Intake 440 ml  Output 175 ml  Net 265 ml  Wt Readings from Last 3 Encounters:  05/12/18 58.3 kg (128 lb 8 oz)  03/19/18 56.2 kg (124 lb)  12/03/17 56.2 kg (124 lb)     Exam   General: Alert and oriented , NAD  Eyes: ,  HEENT:    Cardiovascular: S1 S2 auscultated, Regular rate and rhythm. No pedal edema b/l  Respiratory: Clear to auscultation bilaterally, no wheezing  Gastrointestinal: Soft, nontender, nondistended, + bowel sounds  Ext: no pedal edema bilaterally  Neuro: no new deficits  Musculoskeletal: No digital cyanosis, clubbing  Skin: No rashes  Psych: Normal affect and demeanor, alert and oriented       Data Reviewed:  I have personally reviewed following labs and imaging studies  Micro Results Recent Results (from the past 240 hour(s))  Blood Culture (routine x 2)     Status: None   Collection Time: 05/04/18 10:44 PM  Result Value Ref Range Status   Specimen Description BLOOD RIGHT ANTECUBITAL  Final   Special Requests   Final    BOTTLES DRAWN AEROBIC AND ANAEROBIC Blood Culture adequate volume   Culture   Final    NO GROWTH 5 DAYS Performed at [facility], [address] [place], [place] [address]    Report Status 05/09/2018 FINAL  Final  Culture, sputum-assessment     Status: None   Collection Time: 05/07/18  5:07 AM  Result Value Ref Range Status   Specimen Description EXPECTORATED SPUTUM  Final   Special Requests   Final    NONE Performed at [facility], [place] [address], [place], [place] [address]    Sputum evaluation THIS SPECIMEN IS  ACCEPTABLE FOR SPUTUM CULTURE  Final   Report Status 05/07/2018 FINAL  Final  Culture, respiratory (NON-Expectorated)     Status: None   Collection Time: 05/07/18  5:07 AM  Result Value Ref Range Status   Specimen Description EXPECTORATED SPUTUM  Final   Special Requests NONE Reflexed from [id number]  Final   Gram Stain   Final    FEW WBC PRESENT, PREDOMINANTLY PMN FEW GRAM POSITIVE COCCI RARE GRAM NEGATIVE RODS RARE GRAM POSITIVE RODS    Culture   Final    FEW Consistent with normal respiratory flora. Performed at [facility], [place] [address], [place], [place] [address]    Report Status 05/09/2018 FINAL  Final    Radiology Reports Dg Chest 2 View  Result Date: 05/08/2018 CLINICAL DATA:  Shortness of breath, cough. EXAM: CHEST - 2 VIEW COMPARISON:  Radiograph of May 04, 2018.  CT scan of April 13, 2018. FINDINGS: Stable cardiomegaly. Right hilar prominence is again noted consistent with adenopathy. Stable right upper lobe opacity is noted concerning for atelectasis or possibly pneumonia. Bony thorax is unremarkable. Left-sided pacemaker is unchanged in position. Status post aortic valve repair. Left lung is clear. Stable right upper lobe airspace opacity is noted. IMPRESSION: Stable right hilar prominence concerning for adenopathy. Stable right upper lobe airspace opacity is noted concerning for postobstructive atelectasis or possibly inflammation. Electronically Signed   By: Marijo Conception, M.D.   On: 05/08/2018 12:52   Ct Head W & Wo Contrast  Result Date: 05/07/2018 CLINICAL DATA:  New onset altered level of consciousness. Lung mass with metastases. Evaluate for brain metastases. EXAM: CT HEAD WITHOUT AND WITH CONTRAST TECHNIQUE: Contiguous axial images were obtained from the base of the skull through the vertex without and with intravenous contrast CONTRAST:  75mL OMNIPAQUE IOHEXOL 300 MG/ML  SOLN  COMPARISON:  Sinus CT 04/13/2018.  No prior brain imaging. FINDINGS: Brain: There is no  evidence of acute infarct, intracranial hemorrhage, mass, midline shift, or extra-axial fluid collection. Mild cerebral atrophy is not greater than expected for patient's age. No abnormal enhancement is identified. Vascular: Mild calcified atherosclerosis at the skull base. Major dural venous sinuses are grossly patent. Skull: No fracture or suspicious osseous lesion. Sinuses/Orbits: Postsurgical changes in the paranasal sinuses including right maxillary antrostomy. Mild right maxillary sinus mucosal thickening. Clear mastoid air cells. Unremarkable orbits. Other: None. IMPRESSION: Unremarkable CT appearance of the brain for age. No evidence of acute abnormality or metastatic disease. Electronically Signed   By: Logan Bores M.D.   On: 05/07/2018 14:07   Dg Esophagus  Result Date: 05/08/2018 CLINICAL DATA:  Aspiration into airway EXAM: ESOPHOGRAM/BARIUM SWALLOW TECHNIQUE: Single contrast examination was performed using thin barium or water soluble. FLUOROSCOPY TIME:  Fluoroscopy Time:  1.3 minutes Radiation Exposure Index (if provided by the fluoroscopic device): 9.4 mGy Number of Acquired Spot Images: 0 COMPARISON:  Chest CT 04/13/2018 FINDINGS: Limited study. Double contrast and recumbent motility imaging was not considered safe given the degree of stasis described below. Oblique pharyngeal imaging was negative for aspiration, stasis, or collection. Near full column stasis of thin liquid above a smoothly narrowed GE junction. No specific esophageal dysmotility, including aperistalsis. A 13 mm barium tablet was provided to the patient, but despite multiple attempts she could not pass it be on the mouth. Negative for hiatal hernia. IMPRESSION: 1. Smooth stricture at the GE junction with full column stasis that would slowly drain. A full esophagus was correlated with the patient's episodes of gagging. Despite multiple attempts, the patient could not swallow a 13 mm barium tablet. 2. Negative for aspiration.  Electronically Signed   By: Monte Fantasia M.D.   On: 05/08/2018 17:12   Dg Chest Port 1 View  Result Date: 05/04/2018 CLINICAL DATA:  Cough and dyspnea EXAM: PORTABLE CHEST 1 VIEW COMPARISON:  CXR 04/08/2018 and CT 04/13/2018 FINDINGS: Stable cardiomegaly with aortic atherosclerosis. Aortic valvular replacement with median sternotomy sutures are noted. Left-sided ICD device with leads in the right atrium, coronary sinus and right ventricle as before. Edema and/or thickening along the right minor fissure. Coarsened interstitial lung markings with slightly more confluent airspace opacities in the right upper and middle lobes may reflect evolving pneumonia or postobstructive change. Right hilar adenopathy as seen on prior study is less apparent possibly from superimposition of the cardiac silhouette over the hila. No aggressive osseous abnormality. IMPRESSION: 1. New subpleural areas of airspace opacity in the right upper and middle lobe distribution with coarsened interstitial lung markings. Right upper and middle lobe pneumonia or evolving postobstructive change from known mediastinal and hilar adenopathy are possibilities. Follow-up in 4-6 weeks after treating for pneumonia is recommended. 2. Stable cardiomegaly with aortic atherosclerosis. 3. Status post aortic valvular replacement.  ICD device in place. Electronically Signed   By: Ashley Royalty M.D.   On: 05/04/2018 20:45    Lab Data:  CBC: Recent Labs  Lab 05/08/18 6599 05/09/18 0443 05/10/18 0441 05/11/18 0609 05/12/18 0447  WBC 18.2* 19.5* 19.8* 20.6* 19.7*  HGB 10.1* 9.7* 9.2* 9.7* 9.0*  HCT 33.1* 31.9* 30.3* 32.2* 29.9*  MCV 99.1 97.6 97.1 99.1 98.0  PLT 446* 438* 425* 473* 357*   Basic Metabolic Panel: Recent Labs  Lab 05/08/18 0638 05/09/18 0443 05/10/18 0441 05/11/18 0609 05/12/18 0447  NA 147* 145 145 144 142  K 3.6 3.3*  3.1* 4.2 4.4  CL 92* 91* 94* 95* 93*  CO2 45* 45* 45* 43* 41*  GLUCOSE 164* 169* 87 179* 228*  BUN 20  21* 16 13 13   CREATININE 0.71 0.70 0.59 0.67 0.63  CALCIUM 8.5* 8.4* 8.2* 8.5* 8.2*   GFR: Estimated Creatinine Clearance: 52.9 mL/min (by C-G formula based on SCr of 0.63 mg/dL). Liver Function Tests: No results for input(s): AST, ALT, ALKPHOS, BILITOT, PROT, ALBUMIN in the last 168 hours. No results for input(s): LIPASE, AMYLASE in the last 168 hours. Recent Labs  Lab 05/07/18 1532  AMMONIA 31   Coagulation Profile: No results for input(s): INR, PROTIME in the last 168 hours. Cardiac Enzymes: No results for input(s): CKTOTAL, CKMB, CKMBINDEX, TROPONINI in the last 168 hours. BNP (last 3 results) No results for input(s): PROBNP in the last 8760 hours. HbA1C: No results for input(s): HGBA1C in the last 72 hours. CBG: Recent Labs  Lab 05/11/18 0745 05/11/18 1219 05/11/18 1654 05/11/18 2136 05/12/18 0800  GLUCAP 176* 181* 224* 336* 216*   Lipid Profile: No results for input(s): CHOL, HDL, LDLCALC, TRIG, CHOLHDL, LDLDIRECT in the last 72 hours. Thyroid Function Tests: No results for input(s): TSH, T4TOTAL, FREET4, T3FREE, THYROIDAB in the last 72 hours. Anemia Panel: No results for input(s): VITAMINB12, FOLATE, FERRITIN, TIBC, IRON, RETICCTPCT in the last 72 hours. Urine analysis:    Component Value Date/Time   COLORURINE YELLOW 05/05/2018 [place] 05/05/2018 0537   LABSPEC 1.014 05/05/2018 0537   PHURINE 6.0 05/05/2018 0537   GLUCOSEU 50 (A) 05/05/2018 0537   HGBUR SMALL (A) 05/05/2018 0537   BILIRUBINUR NEGATIVE 05/05/2018 0537   KETONESUR NEGATIVE 05/05/2018 0537   PROTEINUR 30 (A) 05/05/2018 0537   UROBILINOGEN 0.2 01/11/2015 0906   NITRITE NEGATIVE 05/05/2018 0537   LEUKOCYTESUR NEGATIVE 05/05/2018 0537     Ripudeep Rai M.D. [facility] 05/12/2018, 12:13 PM  Pager: [phone] Between 7am to 7pm - call Pager - [phone]  After 7pm go to [email or web address] - password [email or web address]  Call night coverage person covering after 7pm

## 2018-05-12 NOTE — Care Management Important Message (Signed)
Important Message  Patient Details  Name: Carol Warren MRN: [id number] Date of Birth: 26-Oct-1946   Medicare Important Message Given:  Yes  Signed on 05/11/2018  Orbie Pyo 05/12/2018, 7:11 AM

## 2018-05-12 NOTE — H&P (View-Only) (Signed)
Pt seen again at request of pt's attending MD, to re-visit possible need for egd.  Contrary to the pt/family's hx provided yesterday, Dr. Tana Coast and the Speech Therapist had personally witnessed the pt regurgitating after swallowing, and having to suction her mouth thereafter.  SLP opinion, and radiographic evidence, is that the issue is NOT oropharyngeal dysfunction, but rather, impaired emptying of esophagus.  My best impression is that this is the consequence of muscle dysfunction in the lower esophagus, rather than fibrotic stricturing.  On further discussion w/ pt and son today, after reviewing nature, purpose, risks, alternatives (observation), they are agreeable to going ahead w/ egd, and possible dilatation and/or Botox if it appears appropriate at time of egd.   They recognize increased risk of procedure b/o pt's COPD and recent PNA but she prefers not to wait.  They also recognize uncertain benefit of procedure.  On exam, pt sitting up, eating small amounts (no regurg or coughing), no resp distress.  Lungs have mediocre air movement, diffuse rhonchi.  Pt states at baseline she is quite active and can walk at least 100'.  We will plan this for tomorrow.  They are aware it may be Dr. Therisa Doyne rather than myself doing the procedure.   Cleotis Nipper, M.D. Pager [phone] If no answer or after 5 PM call [phone]

## 2018-05-12 NOTE — Progress Notes (Signed)
Pt seen again at request of pt's attending MD, to re-visit possible need for egd.  Contrary to the pt/family's hx provided yesterday, Dr. Tana Coast and the Speech Therapist had personally witnessed the pt regurgitating after swallowing, and having to suction her mouth thereafter.  SLP opinion, and radiographic evidence, is that the issue is NOT oropharyngeal dysfunction, but rather, impaired emptying of esophagus.  My best impression is that this is the consequence of muscle dysfunction in the lower esophagus, rather than fibrotic stricturing.  On further discussion w/ pt and son today, after reviewing nature, purpose, risks, alternatives (observation), they are agreeable to going ahead w/ egd, and possible dilatation and/or Botox if it appears appropriate at time of egd.   They recognize increased risk of procedure b/o pt's COPD and recent PNA but she prefers not to wait.  They also recognize uncertain benefit of procedure.  On exam, pt sitting up, eating small amounts (no regurg or coughing), no resp distress.  Lungs have mediocre air movement, diffuse rhonchi.  Pt states at baseline she is quite active and can walk at least 100'.  We will plan this for tomorrow.  They are aware it may be Dr. Therisa Doyne rather than myself doing the procedure.   Cleotis Nipper, M.D. Pager [phone] If no answer or after 5 PM call [phone]

## 2018-05-12 NOTE — Progress Notes (Addendum)
Inpatient Diabetes Program Recommendations  AACE/ADA: New Consensus Statement on Inpatient Glycemic Control (2015)  Target Ranges:  Prepandial:   less than 140 mg/dL      Peak postprandial:   less than 180 mg/dL (1-2 hours)      Critically ill patients:  140 - 180 mg/dL   Results for TAELA, CHARBONNEAU (MRN [id number]) as of 05/12/2018 10:07  Ref. Range 05/11/2018 07:45 05/11/2018 12:19 05/11/2018 16:54 05/11/2018 21:36 05/12/2018 08:00  Glucose-Capillary Latest Ref Range: 65 - 99 mg/dL 176 (H) 181 (H) 224 (H) 336 (H) 216 (H)   Review of Glycemic Control  Diabetes history: No DM Outpatient Diabetes medications: NA Current orders for Inpatient glycemic control: Novolog 0-9 units TID with meals, Novolog 0-5 units QHS  Inpatient Diabetes Program Recommendations: Insulin - Basal: Please consider ordering Lantus 5 units Q24H (based on 58 kg x 0.1 units).   Thanks, Barnie Alderman, RN, MSN, CDE Diabetes Coordinator Inpatient Diabetes Program [phone] (Team Pager from 8am to 5pm)

## 2018-05-12 NOTE — Care Management Note (Signed)
Case Management Note  Patient Details  Name: Carol Warren MRN: [id number] Date of Birth: 23-Mar-1946  Subjective/Objective:                    Action/Plan: Pt to have EGD in am. Plan is for SNF when medically ready. CM following for d/c disposition.   Expected Discharge Date:  05/12/18               Expected Discharge Plan:  Skilled Nursing Facility  In-House Referral:  Clinical Social Work  Discharge planning Services  CM Consult  Post Acute Care Choice:    Choice offered to:     DME Arranged:    DME Agency:     HH Arranged:    [facility] Agency:     Status of Service:  In process, will continue to follow  If discussed at Long Length of Stay Meetings, dates discussed:    Additional Comments:  Pollie Friar, RN 05/12/2018, 12:22 PM

## 2018-05-13 ENCOUNTER — Encounter (HOSPITAL_COMMUNITY): Payer: Self-pay

## 2018-05-13 ENCOUNTER — Encounter (HOSPITAL_COMMUNITY)
Admission: EM | Disposition: A | Discharge status: Skilled Nursing Facility | Payer: Self-pay | Source: Home / Self Care | Attending: Internal Medicine

## 2018-05-13 ENCOUNTER — Inpatient Hospital Stay (HOSPITAL_COMMUNITY): Payer: Medicare Other | Admitting: Anesthesiology

## 2018-05-13 HISTORY — PX: ESOPHAGOGASTRODUODENOSCOPY (EGD) WITH PROPOFOL: SHX5813

## 2018-05-13 HISTORY — PX: BIOPSY: SHX5522

## 2018-05-13 HISTORY — PX: BOTOX INJECTION: SHX5754

## 2018-05-13 LAB — GLUCOSE, CAPILLARY
GLUCOSE-CAPILLARY: 156 mg/dL — AB (ref 65–99)
GLUCOSE-CAPILLARY: 290 mg/dL — AB (ref 65–99)
Glucose-Capillary: 171 mg/dL — ABNORMAL HIGH (ref 65–99)

## 2018-05-13 SURGERY — ESOPHAGOGASTRODUODENOSCOPY (EGD) WITH PROPOFOL
Anesthesia: Monitor Anesthesia Care

## 2018-05-13 MED ORDER — LACTATED RINGERS IV SOLN
INTRAVENOUS | Status: DC
  Administered 2018-05-13: 11:00:00 via INTRAVENOUS

## 2018-05-13 MED ORDER — SODIUM CHLORIDE 0.9 % IJ SOLN
100.0000 [IU] | Freq: Once | INTRAMUSCULAR | Status: AC
  Administered 2018-05-13: 100 [IU] via SUBMUCOSAL
  Filled 2018-05-13: qty 100

## 2018-05-13 MED ORDER — PHENYLEPHRINE 40 MCG/ML (10ML) SYRINGE FOR IV PUSH (FOR BLOOD PRESSURE SUPPORT)
PREFILLED_SYRINGE | INTRAVENOUS | Status: DC | PRN
  Administered 2018-05-13: 40 ug via INTRAVENOUS

## 2018-05-13 MED ORDER — PROPOFOL 500 MG/50ML IV EMUL
INTRAVENOUS | Status: DC | PRN
  Administered 2018-05-13: 100 ug/kg/min via INTRAVENOUS

## 2018-05-13 MED ORDER — SODIUM CHLORIDE 0.9 % IV SOLN: INTRAVENOUS | Status: DC

## 2018-05-13 MED ORDER — SODIUM CHLORIDE 0.9 % IJ SOLN
INTRAMUSCULAR | Status: AC
  Filled 2018-05-13: qty 10

## 2018-05-13 MED ORDER — IPRATROPIUM-ALBUTEROL 0.5-2.5 (3) MG/3ML IN SOLN
RESPIRATORY_TRACT | Status: AC
  Administered 2018-05-13: 3 mL via RESPIRATORY_TRACT
  Filled 2018-05-13: qty 3

## 2018-05-13 MED ORDER — CARBOXYMETHYLCELLULOSE SODIUM 0.5 % OP SOLN: 1.0000 [drp] | OPHTHALMIC | Status: DC | PRN

## 2018-05-13 SURGICAL SUPPLY — 15 items

## 2018-05-13 NOTE — Op Note (Signed)
[facility] Patient Name: Carol Warren Procedure Date : 05/13/2018 MRN: [id number] Attending MD: Ronnette Juniper , MD Date of Birth: Sep 28, 1946 CSN: [id number] Age: 72 Admit Type: Inpatient Procedure:                Upper GI endoscopy Indications:              Dysphagia, Abnormal cine-esophagram Providers:                Ronnette Juniper, MD, Presley Raddle, RN, Charolette Child,                            Technician, Rhae Lerner, CRNA Referring MD:              Medicines:                Monitored Anesthesia Care Complications:            No immediate complications. Estimated Blood Loss:     Estimated blood loss was minimal. Procedure:                Pre-Anesthesia Assessment:                           - Prior to the procedure, a History and Physical                            was performed, and patient medications and                            allergies were reviewed. The patient's tolerance of                            previous anesthesia was also reviewed. The risks                            and benefits of the procedure and the sedation                            options and risks were discussed with the patient.                            All questions were answered, and informed consent                            was obtained. Prior Anticoagulants: The patient has                            taken no previous anticoagulant or antiplatelet                            agents. ASA Grade Assessment: III - A patient with                            severe systemic disease. After reviewing the risks  and benefits, the patient was deemed in                            satisfactory condition to undergo the procedure.                           After obtaining informed consent, the endoscope was                            passed under direct vision. Throughout the                            procedure, the patient's blood pressure, pulse, and       oxygen saturations were monitored continuously. The                            EG-[id number] ([id number]) scope was introduced through the                            mouth, and advanced to the second part of duodenum.                            The upper GI endoscopy was accomplished without                            difficulty. The patient tolerated the procedure                            well. Scope In: Scope Out: Findings:      The lumen of the upper third of the esophagus and middle third of the       esophagus was mildly dilated. Some retained food was noted in the entire       esophagus. The esophagus appeared slightly tortuous.      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 Biopsies were obtained from the       proximal and distal esophagus with cold forceps for histology of       suspected eosinophilic esophagitis.      No obvious narrowing or stenosis noted in the lower esophagus and GE       junction. I did not appreciate any resistance to passage of scope in to       the gastric cavity.      Patchy mildly erythematous mucosa without bleeding was found in the       gastric body and at the pylorus.      The cardia and gastric fundus were normal on retroflexion.      The examined duodenum was normal.      Areas at 35 cm from the incisors, 1 cm above the GE junction were       successfully injected with 100 units botulinum toxin, 25 units /ml,       total  4 ml, 1 ml in each quadrant. 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                            - Altered texture mucosa in the esophagus. Biopsied.                           - Erythematous mucosa in the gastric body and                            pylorus.                           - Normal examined duodenum.                           - Areas at 35 cm from incisors successfully                            injected with botulinum toxin. Moderate Sedation:      Patient did not receive moderate sedation for this procedure, but       instead received monitored anesthesia care. Recommendation:           - Advance diet as tolerated.                           - Continue present medications.                           - Await pathology results.                           - Esophageal manometry as an outpatient if general                            condition allows. Procedure Code(s):        --- Professional ---                           [phone], 59, Esophagogastroduodenoscopy, flexible,                            transoral; with directed submucosal injection(s),                            any substance  47340, Esophagogastroduodenoscopy, flexible,                            transoral; with biopsy, single or multiple Diagnosis Code(s):        --- Professional ---                           K22.8, Other specified diseases of esophagus                           K31.89, Other diseases of stomach and duodenum                           R13.10, Dysphagia, unspecified                           R93.3, Abnormal findings on diagnostic imaging of                            other parts of digestive tract CPT copyright 2017 [employer]. All rights reserved. The codes documented in this report are preliminary and upon coder review may  be revised to meet current compliance requirements. Ronnette Juniper, MD 05/13/2018 1:22:56 PM This report has been signed electronically. Number of Addenda: 0

## 2018-05-13 NOTE — Anesthesia Preprocedure Evaluation (Addendum)
Anesthesia Evaluation  Patient identified by MRN, date of birth, ID band Patient awake    Reviewed: Allergy & Precautions, NPO status , Patient's Chart, lab work & pertinent test results  Airway Mallampati: I  TM Distance: >3 FB Neck ROM: Full    Dental  (+) Edentulous Upper   Pulmonary COPD, Current Smoker,    Pulmonary exam normal        Cardiovascular hypertension, + CAD  Normal cardiovascular exam+ Cardiac Defibrillator + Valvular Problems/Murmurs AI and AS   ECHO 08/11/2018 Study Conclusions  - Left ventricle: The cavity size was normal. Wall thickness was   increased in a pattern of mild LVH. Systolic function was normal.   The estimated ejection fraction was in the range of 60% to 65%.   Wall motion was normal; there were no regional wall motion   abnormalities. Features are consistent with a pseudonormal left   ventricular filling pattern, with concomitant abnormal relaxation   and increased filling pressure (grade 2 diastolic dysfunction). - Aortic valve: Valve area (VTI): 1.51 cm^2. Valve area (Vmax):   1.45 cm^2. Valve area (Vmean): 1.51 cm^2. - Mitral valve: Systolic bowing without prolapse. There was   moderate regurgitation. - Left atrium: The atrium was mildly dilated.   Neuro/Psych    GI/Hepatic GERD  Medicated and Controlled,  Endo/Other    Renal/GU      Musculoskeletal   Abdominal   Peds  Hematology   Anesthesia Other Findings   Reproductive/Obstetrics                           Anesthesia Physical Anesthesia Plan  ASA: III  Anesthesia Plan: MAC   Post-op Pain Management:    Induction: Intravenous  PONV Risk Score and Plan: 1 and Treatment may vary due to age or medical condition  Airway Management Planned: Simple Face Mask  Additional Equipment:   Intra-op Plan:   Post-operative Plan:   Informed Consent: I have reviewed the patients History and  Physical, chart, labs and discussed the procedure including the risks, benefits and alternatives for the proposed anesthesia with the patient or authorized representative who has indicated his/her understanding and acceptance.     Plan Discussed with: CRNA and Surgeon  Anesthesia Plan Comments:         Anesthesia Quick Evaluation

## 2018-05-13 NOTE — Anesthesia Procedure Notes (Signed)
Procedure Name: MAC Date/Time: 05/13/2018 12:57 PM Performed by: Kyung Rudd, CRNA Pre-anesthesia Checklist: Patient identified, Emergency Drugs available, Suction available and Patient being monitored Patient Re-evaluated:Patient Re-evaluated prior to induction Oxygen Delivery Method: Nasal cannula Induction Type: IV induction Placement Confirmation: positive ETCO2 Dental Injury: Teeth and Oropharynx as per pre-operative assessment

## 2018-05-13 NOTE — Progress Notes (Signed)
[facility]                                                                              Patient Demographics  Carol Warren, is a 72 y.o. female, DOB - 1946/12/10, LSL:[id number]  Admit date - 05/04/2018   Admitting Physician Vianne Bulls, MD  Outpatient Primary MD for the patient is Ernestene Kiel, MD  Outpatient specialists:   LOS - 8  days   Medical records reviewed and are as summarized below:    Chief Complaint  Patient presents with  . Shortness of Breath       Brief summary   Carol Warren is a 72 year old female with history of COPD, ongoing tobacco abuse, chronic diastolic and prior systolic CHF admitted with productive cough, fevers and chills and shortness of breath. Of note, she had an outpatient CT scan done by her PCP last month in [place], Dr.Plotnov which was concerning for widespread chest adenopathy and bilateral adrenal nodules concerning for possible advanced lung cancer. She was referred to pulmonary Dr. Melvyn Novas and has of upcoming appointment soon on 5/9  Assessment & Plan      Community acquired pneumonia versus postobstructive -Blood cultures negative so far, also has aspiration component due to esophageal stricture -Leukocytosis improving, possibly high due to steroids.  Steroids discontinued on 5/12. - Blood cultures 5/6 negative so far - off IV Unasyn, transitioned to oral Augmentin, completed 9days of Abx, stop augmentin    COPD Exacerbation GOLD 0/ still smoking  -No wheezing, continue nebs,  -wean O2   Abnormal recent CT chest concerning for metastatic disease -Patient CT performed last month per admission H&P, showed adenopathy in the chest, bilateral axilla, bilateral adrenal masses concerning for metastatic disease -Pulmonology, Dr. Halford Chessman was consulted, appreciate recommendations, outpatient PET/CT and further management  Aspiration/dysphagia -Esophagogram shows distal esophageal stricture,  -[facility] consulted, EGD this  afternoon  Acute metabolic encephalopathy: -Hypercapnia, compensated due to severe COPD, nicotine abuse, BiPAP as needed -TSH low, normal T4, low T3, possibly resolving thyroiditis, repeat TFTs in 1 month  Acute on chronic diastolic CHF (congestive heart failure) ([place]) 2D echo 07/2017 showed 11%, grade 2 diastolic dysfunction Continue to hold Lasix until patient is able to tolerate diet.    Diabetes mellitus, type II uncontrolled.  With hyper and hypoglycemia  hemoglobin A1c 8.9 -Lantus  restarted -continue sliding scale insulin  Nicotine abuse - Placed on nicotine patch    Code Status: Full CODE STATUS DVT Prophylaxis:  Lovenox Family Communication: Discussed in detail with the patient, all imaging results, lab results explained to the patient    Disposition Plan: SNF tomorrow after endoscopy  Time Spent in minutes 25 minutes  Procedures:  None  Consultants:   Pulmonology GI  Antimicrobials:   IV Unasyn 5/11 to 5/14   Medications  Scheduled Meds: . amoxicillin-clavulanate  1 tablet Oral Q12H  . aspirin  325 mg Oral Daily  . benzonatate  100 mg Oral TID  . carvedilol  18.75 mg Oral BID WC  . enoxaparin (LOVENOX) injection  40 mg Subcutaneous Q24H  . FLUoxetine  20 mg Oral Daily  . gabapentin  100 mg Oral TID  . guaiFENesin  600 mg Oral BID  . insulin aspart  0-9 Units Subcutaneous TID WC  . insulin aspart  3 Units Subcutaneous TID WC  . insulin glargine  5 Units Subcutaneous Daily  . ipratropium-albuterol  3 mL Nebulization TID  . losartan  50 mg Oral Daily  . mouth rinse  15 mL Mouth Rinse BID  . montelukast  10 mg Oral QHS  . multivitamin with minerals  1 tablet Oral Daily  . nicotine  21 mg Transdermal Daily  . pantoprazole  40 mg Oral Q0600  . sodium chloride flush  3 mL Intravenous Q12H  . sodium chloride flush  3 mL Intravenous Q12H   Continuous Infusions: . sodium chloride     PRN Meds:.sodium chloride, acetaminophen **OR** acetaminophen,  albuterol, bisacodyl, guaiFENesin-dextromethorphan, HYDROcodone-acetaminophen, ondansetron **OR** ondansetron (ZOFRAN) IV, polyvinyl alcohol, senna-docusate, sodium chloride flush   Antibiotics   Anti-infectives (From admission, onward)   Start     Dose/Rate Route Frequency Ordered Stop   05/12/18 1000  amoxicillin-clavulanate (AUGMENTIN) 875-125 MG per tablet 1 tablet     1 tablet Oral Every 12 hours 05/12/18 0930     05/12/18 0000  amoxicillin-clavulanate (AUGMENTIN) 875-125 MG tablet     1 tablet Oral 2 times daily 05/12/18 0934 05/17/18 2359   05/09/18 1200  piperacillin-tazobactam (ZOSYN) IVPB 3.375 g  Status:  Discontinued     3.375 g 12.5 mL/hr over 240 Minutes Intravenous Every 8 hours 05/09/18 1125 05/09/18 1139   05/09/18 1200  ampicillin-sulbactam (UNASYN) 1.5 g in sodium chloride 0.9 % 100 mL IVPB  Status:  Discontinued     1.5 g 200 mL/hr over 30 Minutes Intravenous Every 6 hours 05/09/18 1139 05/12/18 0930   05/05/18 2200  cefTRIAXone (ROCEPHIN) 1 g in sodium chloride 0.9 % 100 mL IVPB  Status:  Discontinued     1 g 200 mL/hr over 30 Minutes Intravenous Every 24 hours 05/05/18 0111 05/09/18 1103   05/05/18 2200  azithromycin (ZITHROMAX) 500 mg in sodium chloride 0.9 % 250 mL IVPB  Status:  Discontinued     500 mg 250 mL/hr over 60 Minutes Intravenous Every 24 hours 05/05/18 0111 05/08/18 0913   05/04/18 2215  cefTRIAXone (ROCEPHIN) 1 g in sodium chloride 0.9 % 100 mL IVPB     1 g 200 mL/hr over 30 Minutes Intravenous  Once 05/04/18 2213 05/05/18 0007   05/04/18 2215  azithromycin (ZITHROMAX) tablet 500 mg     500 mg Oral  Once 05/04/18 2213 05/04/18 2316        Subjective:   Carol Warren was examined, she reports feeling ok, wants coffee pending EGD  Objective:   Vitals:   05/13/18 1115 05/13/18 1325 05/13/18 1335 05/13/18 1346  BP: (!) 146/71 125/64 136/66 (!) 143/69  Pulse: 78 81 80 80  Resp: (!) 22 17 (!) 24 19  Temp: 98.3 F (36.8 C) 98.1 F (36.7 C)      TempSrc: Oral Oral    SpO2: 96% 95% 98% 99%  Weight: 61.7 kg (136 lb)     Height: 5\' 1"  (1.549 m)       Intake/Output Summary (Last 24 hours) at 05/13/2018 1423 Last data filed at 05/13/2018 1415 Gross per 24 hour  Intake 59 ml  Output 300 ml  Net -241 ml     Wt Readings from Last 3 Encounters:  05/13/18 61.7 kg (136 lb)  03/19/18 56.2 kg (124 lb)  12/03/17 56.2 kg (124  lb)     Exam  Gen: Awake, Alert, Oriented X 3, chronically ill-appearing cachectic female HEENT: PERRLA, Neck supple, no JVD Lungs: improved movement, no wheezes CVS: RRR,No Gallops,Rubs or new Murmurs Abd: soft, Non tender, non distended, BS present Extremities: No Cyanosis, Clubbing or edema Skin: no new rashes Psych: Normal affect and demeanor, alert and oriented       Data Reviewed:  I have personally reviewed following labs and imaging studies  Micro Results Recent Results (from the past 240 hour(s))  Blood Culture (routine x 2)     Status: None   Collection Time: 05/04/18 10:44 PM  Result Value Ref Range Status   Specimen Description BLOOD RIGHT ANTECUBITAL  Final   Special Requests   Final    BOTTLES DRAWN AEROBIC AND ANAEROBIC Blood Culture adequate volume   Culture   Final    NO GROWTH 5 DAYS Performed at [facility], [address] [place], [place] [address]    Report Status 05/09/2018 FINAL  Final  Culture, sputum-assessment     Status: None   Collection Time: 05/07/18  5:07 AM  Result Value Ref Range Status   Specimen Description EXPECTORATED SPUTUM  Final   Special Requests   Final    NONE Performed at [facility], [place] [address] [place], [place] [address]    Sputum evaluation THIS SPECIMEN IS ACCEPTABLE FOR SPUTUM CULTURE  Final   Report Status 05/07/2018 FINAL  Final  Culture, respiratory (NON-Expectorated)     Status: None   Collection Time: 05/07/18  5:07 AM  Result Value Ref Range Status   Specimen Description EXPECTORATED SPUTUM  Final   Special  Requests NONE Reflexed from [id number]  Final   Gram Stain   Final    FEW WBC PRESENT, PREDOMINANTLY PMN FEW GRAM POSITIVE COCCI RARE GRAM NEGATIVE RODS RARE GRAM POSITIVE RODS    Culture   Final    FEW Consistent with normal respiratory flora. Performed at [facility], [place] [address] [place],  [address]    Report Status 05/09/2018 FINAL  Final    Radiology Reports Dg Chest 2 View  Result Date: 05/08/2018 CLINICAL DATA:  Shortness of breath, cough. EXAM: CHEST - 2 VIEW COMPARISON:  Radiograph of May 04, 2018.  CT scan of April 13, 2018. FINDINGS: Stable cardiomegaly. Right hilar prominence is again noted consistent with adenopathy. Stable right upper lobe opacity is noted concerning for atelectasis or possibly pneumonia. Bony thorax is unremarkable. Left-sided pacemaker is unchanged in position. Status post aortic valve repair. Left lung is clear. Stable right upper lobe airspace opacity is noted. IMPRESSION: Stable right hilar prominence concerning for adenopathy. Stable right upper lobe airspace opacity is noted concerning for postobstructive atelectasis or possibly inflammation. Electronically Signed   By: Marijo Conception, M.D.   On: 05/08/2018 12:52   Ct Head W & Wo Contrast  Result Date: 05/07/2018 CLINICAL DATA:  New onset altered level of consciousness. Lung mass with metastases. Evaluate for brain metastases. EXAM: CT HEAD WITHOUT AND WITH CONTRAST TECHNIQUE: Contiguous axial images were obtained from the base of the skull through the vertex without and with intravenous contrast CONTRAST:  79mL OMNIPAQUE IOHEXOL 300 MG/ML  SOLN COMPARISON:  Sinus CT 04/13/2018.  No prior brain imaging. FINDINGS: Brain: There is no evidence of acute infarct, intracranial hemorrhage, mass, midline shift, or extra-axial fluid collection. Mild cerebral atrophy is not greater than expected for patient's age. No abnormal enhancement is identified. Vascular:  Mild calcified atherosclerosis at the skull  base. Major dural venous sinuses are grossly patent. Skull: No fracture or suspicious osseous lesion. Sinuses/Orbits: Postsurgical changes in the paranasal sinuses including right maxillary antrostomy. Mild right maxillary sinus mucosal thickening. Clear mastoid air cells. Unremarkable orbits. Other: None. IMPRESSION: Unremarkable CT appearance of the brain for age. No evidence of acute abnormality or metastatic disease. Electronically Signed   By: Logan Bores M.D.   On: 05/07/2018 14:07   Dg Esophagus  Result Date: 05/08/2018 CLINICAL DATA:  Aspiration into airway EXAM: ESOPHOGRAM/BARIUM SWALLOW TECHNIQUE: Single contrast examination was performed using thin barium or water soluble. FLUOROSCOPY TIME:  Fluoroscopy Time:  1.3 minutes Radiation Exposure Index (if provided by the fluoroscopic device): 9.4 mGy Number of Acquired Spot Images: 0 COMPARISON:  Chest CT 04/13/2018 FINDINGS: Limited study. Double contrast and recumbent motility imaging was not considered safe given the degree of stasis described below. Oblique pharyngeal imaging was negative for aspiration, stasis, or collection. Near full column stasis of thin liquid above a smoothly narrowed GE junction. No specific esophageal dysmotility, including aperistalsis. A 13 mm barium tablet was provided to the patient, but despite multiple attempts she could not pass it be on the mouth. Negative for hiatal hernia. IMPRESSION: 1. Smooth stricture at the GE junction with full column stasis that would slowly drain. A full esophagus was correlated with the patient's episodes of gagging. Despite multiple attempts, the patient could not swallow a 13 mm barium tablet. 2. Negative for aspiration. Electronically Signed   By: Monte Fantasia M.D.   On: 05/08/2018 17:12   Dg Chest Port 1 View  Result Date: 05/04/2018 CLINICAL DATA:  Cough and dyspnea EXAM: PORTABLE CHEST 1 VIEW COMPARISON:  CXR 04/08/2018 and CT 04/13/2018 FINDINGS: Stable cardiomegaly with aortic  atherosclerosis. Aortic valvular replacement with median sternotomy sutures are noted. Left-sided ICD device with leads in the right atrium, coronary sinus and right ventricle as before. Edema and/or thickening along the right minor fissure. Coarsened interstitial lung markings with slightly more confluent airspace opacities in the right upper and middle lobes may reflect evolving pneumonia or postobstructive change. Right hilar adenopathy as seen on prior study is less apparent possibly from superimposition of the cardiac silhouette over the hila. No aggressive osseous abnormality. IMPRESSION: 1. New subpleural areas of airspace opacity in the right upper and middle lobe distribution with coarsened interstitial lung markings. Right upper and middle lobe pneumonia or evolving postobstructive change from known mediastinal and hilar adenopathy are possibilities. Follow-up in 4-6 weeks after treating for pneumonia is recommended. 2. Stable cardiomegaly with aortic atherosclerosis. 3. Status post aortic valvular replacement.  ICD device in place. Electronically Signed   By: Ashley Royalty M.D.   On: 05/04/2018 20:45    Lab Data:  CBC: Recent Labs  Lab 05/08/18 7654 05/09/18 0443 05/10/18 0441 05/11/18 0609 05/12/18 0447  WBC 18.2* 19.5* 19.8* 20.6* 19.7*  HGB 10.1* 9.7* 9.2* 9.7* 9.0*  HCT 33.1* 31.9* 30.3* 32.2* 29.9*  MCV 99.1 97.6 97.1 99.1 98.0  PLT 446* 438* 425* 473* 650*   Basic Metabolic Panel: Recent Labs  Lab 05/08/18 0638 05/09/18 0443 05/10/18 0441 05/11/18 0609 05/12/18 0447  NA 147* 145 145 144 142  K 3.6 3.3* 3.1* 4.2 4.4  CL 92* 91* 94* 95* 93*  CO2 45* 45* 45* 43* 41*  GLUCOSE 164* 169* 87 179* 228*  BUN 20 21* 16 13 13   CREATININE 0.71 0.70 0.59 0.67 0.63  CALCIUM 8.5* 8.4* 8.2* 8.5* 8.2*  GFR: Estimated Creatinine Clearance: 54.4 mL/min (by C-G formula based on SCr of 0.63 mg/dL). Liver Function Tests: No results for input(s): AST, ALT, ALKPHOS, BILITOT, PROT,  ALBUMIN in the last 168 hours. No results for input(s): LIPASE, AMYLASE in the last 168 hours. Recent Labs  Lab 05/07/18 1532  AMMONIA 31   Coagulation Profile: No results for input(s): INR, PROTIME in the last 168 hours. Cardiac Enzymes: No results for input(s): CKTOTAL, CKMB, CKMBINDEX, TROPONINI in the last 168 hours. BNP (last 3 results) No results for input(s): PROBNP in the last 8760 hours. HbA1C: No results for input(s): HGBA1C in the last 72 hours. CBG: Recent Labs  Lab 05/12/18 0800 05/12/18 1226 05/12/18 1710 05/12/18 2219 05/13/18 0754  GLUCAP 216* 329* 270* 150* 156*   Lipid Profile: No results for input(s): CHOL, HDL, LDLCALC, TRIG, CHOLHDL, LDLDIRECT in the last 72 hours. Thyroid Function Tests: No results for input(s): TSH, T4TOTAL, FREET4, T3FREE, THYROIDAB in the last 72 hours. Anemia Panel: No results for input(s): VITAMINB12, FOLATE, FERRITIN, TIBC, IRON, RETICCTPCT in the last 72 hours. Urine analysis:    Component Value Date/Time   COLORURINE YELLOW 05/05/2018 [place] 05/05/2018 0537   LABSPEC 1.014 05/05/2018 0537   PHURINE 6.0 05/05/2018 0537   GLUCOSEU 50 (A) 05/05/2018 0537   HGBUR SMALL (A) 05/05/2018 0537   BILIRUBINUR NEGATIVE 05/05/2018 0537   KETONESUR NEGATIVE 05/05/2018 0537   PROTEINUR 30 (A) 05/05/2018 0537   UROBILINOGEN 0.2 01/11/2015 0906   NITRITE NEGATIVE 05/05/2018 0537   LEUKOCYTESUR NEGATIVE 05/05/2018 5329     Domenic Polite M.D. [facility] 05/13/2018, 2:23 PM  Page via [email or web address], password [email or web address] After 7pm call night coverage person covering after 7pm

## 2018-05-13 NOTE — Transfer of Care (Signed)
Immediate Anesthesia Transfer of Care Note  Patient: Carol Warren  Procedure(s) Performed: ESOPHAGOGASTRODUODENOSCOPY (EGD) WITH PROPOFOL (N/A )  Patient Location: Endoscopy Unit  Anesthesia Type:MAC  Level of Consciousness: awake, alert  and oriented  Airway & Oxygen Therapy: Patient Spontanous Breathing and Patient connected to nasal cannula oxygen  Post-op Assessment: Report given to RN, Post -op Vital signs reviewed and stable and Patient moving all extremities  Post vital signs: Reviewed and stable  Last Vitals:  Vitals Value Taken Time  BP 125/64 05/13/2018  1:25 PM  Temp    Pulse 81 05/13/2018  1:26 PM  Resp 23 05/13/2018  1:26 PM  SpO2 96 % 05/13/2018  1:26 PM  Vitals shown include unvalidated device data.  Last Pain:  Vitals:   05/13/18 1325  TempSrc: Oral  PainSc: 0-No pain      Patients Stated Pain Goal: 0 (68/61/68 3729)  Complications: No apparent anesthesia complications

## 2018-05-13 NOTE — Anesthesia Postprocedure Evaluation (Signed)
Anesthesia Post Note  Patient: Madaline A Billet  Procedure(s) Performed: ESOPHAGOGASTRODUODENOSCOPY (EGD) WITH PROPOFOL (N/A ) BIOPSY (N/A ) SUBMUCOSAL INJECTION     Patient location during evaluation: PACU Anesthesia Type: MAC Level of consciousness: awake and alert Pain management: pain level controlled Vital Signs Assessment: post-procedure vital signs reviewed and stable Respiratory status: spontaneous breathing, nonlabored ventilation, respiratory function stable and patient connected to nasal cannula oxygen Cardiovascular status: stable and blood pressure returned to baseline Postop Assessment: no apparent nausea or vomiting Anesthetic complications: no    Last Vitals:  Vitals:   05/13/18 1335 05/13/18 1346  BP: 136/66 (!) 143/69  Pulse: 80 80  Resp: (!) 24 19  Temp:    SpO2: 98% 99%    Last Pain:  Vitals:   05/13/18 1346  TempSrc:   PainSc: 0-No pain                 Niraj Kudrna DAVID

## 2018-05-13 NOTE — Brief Op Note (Signed)
05/04/2018 - 05/13/2018  1:23 PM  PATIENT:  Carol Warren  72 y.o. female  PRE-OPERATIVE DIAGNOSIS:  dysphagia  POST-OPERATIVE DIAGNOSIS:  * No post-op diagnosis entered *  PROCEDURE:  Procedure(s) with comments: ESOPHAGOGASTRODUODENOSCOPY (EGD) WITH PROPOFOL (N/A) - possible through-the-scope balloon dilatation, possible Botox injection  SURGEON:  Surgeon(s) and Role:    Ronnette Juniper, MD - Primary  PHYSICIAN ASSISTANT:   ASSISTANTS: Shelton Silvas, Charolette Child, Tech ANESTHESIA:   MAC  EBL:  Minimal  BLOOD ADMINISTERED:none  DRAINS: none   LOCAL MEDICATIONS USED:  NONE  SPECIMEN:  Biopsy / Limited Resection  DISPOSITION OF SPECIMEN:  PATHOLOGY  COUNTS:  YES  TOURNIQUET:  * No tourniquets in log *  DICTATION: .Dragon Dictation  PLAN OF CARE: Admit to inpatient   PATIENT DISPOSITION:  PACU - hemodynamically stable.   Delay start of Pharmacological VTE agent (>24hrs) due to surgical blood loss or risk of bleeding: no

## 2018-05-13 NOTE — Op Note (Signed)
EGD was performed for dysphagia and abnormal barium swallow. Findings:  Mouth upper and middle third of esophagus mildly dilated with some retained food noted in the entire esophagus. Mildly tortuous esophagus. No obvious narrowing or stenosis noted in lower esophagus and GE junction. Mucosa of the esophagus had altered texture, whitish areas possibly related to esophageal dessicans. Biopsies taken to rule out eosinophilic esophagitis. Erythematous mucosa noted in the gastric body and pylorus. Normal cardia and fundus on retroflexion. Normal duodenal bulb and duodenum.  Botox injected in 4 quadrant 1 cm above the GE junction at 35 cm from incisors-total 100 units, 25 units per mL, total 4 mL, 1 mL in each quadrant.  Recommendations: Advance diet as tolerated. She will benefit from esophageal manometry as an outpatient if general condition permits. Biopsies will be followed as an outpatient.  Ronnette Juniper, M.D.

## 2018-05-13 NOTE — Progress Notes (Signed)
  Speech Language Pathology Treatment: Dysphagia  Patient Details Name: Carol Warren MRN: [id number] DOB: March 12, 1946 Today's Date: 05/13/2018 Time: 7628-3151 SLP Time Calculation (min) (ACUTE ONLY): 11 min  Assessment / Plan / Recommendation Clinical Impression  Pt continues to have seemingly adequate oropharyngeal function, followed by delayed coughing and expectoration of moderate amount of frothy secretions. Pt is not a reliable descriptor of her symptoms. When asked where it comes from she says the "slide of her dentures". When asked to clarify, she verbally insists it is coming from her dentures but then is pointing to her throat to show me. She also denies a direct correlation with swallowing, but she appears to consistently belch/cough a few seconds after each sip. Suspect that pt remains at highest risk for aspiration post-prandially given esophageal concerns. Would continue with modified diet as she remains so symptomatic.    HPI HPI: 72 yo female smoker with dyspnea, cough, fever, chills from PNA, COPD, and lung mass.  PMHx of CAD, HTN, HFpEF, s/p AICD, Depression, GERD, Pernicious anemia      SLP Plan  Continue with current plan of care       Recommendations  Diet recommendations: Dysphagia 1 (puree);Thin liquid Liquids provided via: Cup;Straw Medication Administration: Crushed with puree Supervision: Patient able to self feed Compensations: Slow rate;Small sips/bites;Follow solids with liquid;Minimize environmental distractions Postural Changes and/or Swallow Maneuvers: Upright 30-60 min after meal;Seated upright 90 degrees                Oral Care Recommendations: Oral care BID Follow up Recommendations: Skilled Nursing facility SLP Visit Diagnosis: Dysphagia, unspecified (R13.10) Plan: Continue with current plan of care       GO                Germain Osgood 05/13/2018, 4:38 PM  Germain Osgood, M.A. CCC-SLP [phone]

## 2018-05-13 NOTE — Interval H&P Note (Signed)
History and Physical Interval Note:  72/female with dysphagia for EGD with possible dilation vs botox injection.  05/13/2018 11:26 AM  Zigmund Daniel A [place]  has presented today for EGD, with the diagnosis of dysphagia  The various methods of treatment have been discussed with the patient and family. After consideration of risks, benefits and other options for treatment, the patient has consented to  Procedure(s) with comments: ESOPHAGOGASTRODUODENOSCOPY (EGD) WITH PROPOFOL (N/A) - possible through-the-scope balloon dilatation, possible Botox injection as a surgical intervention .  The patient's history has been reviewed, patient examined, no change in status, stable for surgery.  I have reviewed the patient's chart and labs.  Questions were answered to the patient's satisfaction.     Ronnette Juniper

## 2018-05-14 ENCOUNTER — Encounter (HOSPITAL_COMMUNITY): Payer: Self-pay | Admitting: Gastroenterology

## 2018-05-14 DIAGNOSIS — R918 Other nonspecific abnormal finding of lung field: Secondary | ICD-10-CM

## 2018-05-14 LAB — CBC
HCT: 30.1 % — ABNORMAL LOW (ref 36.0–46.0)
Hemoglobin: 9.3 g/dL — ABNORMAL LOW (ref 12.0–15.0)
MCH: 29.8 pg (ref 26.0–34.0)
MCHC: 30.9 g/dL (ref 30.0–36.0)
MCV: 96.5 fL (ref 78.0–100.0)
PLATELETS: 492 10*3/uL — AB (ref 150–400)
RBC: 3.12 MIL/uL — AB (ref 3.87–5.11)
RDW: 14 % (ref 11.5–15.5)
WBC: 19.5 10*3/uL — AB (ref 4.0–10.5)

## 2018-05-14 LAB — BASIC METABOLIC PANEL
Anion gap: 11 (ref 5–15)
BUN: 11 mg/dL (ref 6–20)
CALCIUM: 8.3 mg/dL — AB (ref 8.9–10.3)
CO2: 37 mmol/L — ABNORMAL HIGH (ref 22–32)
CREATININE: 0.55 mg/dL (ref 0.44–1.00)
Chloride: 94 mmol/L — ABNORMAL LOW (ref 101–111)
GFR calc Af Amer: >60 mL/min (ref >60)
GLUCOSE: 215 mg/dL — AB (ref 65–99)
POTASSIUM: 4.3 mmol/L (ref 3.5–5.1)
SODIUM: 142 mmol/L (ref 135–145)

## 2018-05-14 LAB — GLUCOSE, CAPILLARY
GLUCOSE-CAPILLARY: 190 mg/dL — AB (ref 65–99)
Glucose-Capillary: 223 mg/dL — ABNORMAL HIGH (ref 65–99)
Glucose-Capillary: 147 mg/dL — ABNORMAL HIGH (ref 65–99)

## 2018-05-14 MED ORDER — INSULIN GLARGINE 100 UNIT/ML ~~LOC~~ SOLN: 5.0000 [IU] | Freq: Every day | SUBCUTANEOUS | 0 refills | Status: AC

## 2018-05-14 MED ORDER — INSULIN LISPRO 100 UNIT/ML ~~LOC~~ SOLN: 1.0000 [IU] | Freq: Three times a day (TID) | SUBCUTANEOUS | 0 refills | Status: AC

## 2018-05-14 MED ORDER — ACETAMINOPHEN 325 MG PO TABS: 650.0000 mg | ORAL_TABLET | Freq: Four times a day (QID) | ORAL | Status: AC | PRN

## 2018-05-14 NOTE — Clinical Social Work Placement (Signed)
   CLINICAL SOCIAL WORK PLACEMENT  NOTE  Date:  05/14/2018  Patient Details  Name: Carol Warren MRN: [id number] Date of Birth: June 13, 1946  Clinical Social Work is seeking post-discharge placement for this patient at the [place] level of care (*CSW will initial, date and re-position this form in  chart as items are completed):  Yes   Patient/family provided with [facility] Work Department's list of facilities offering this level of care within the geographic area requested by the patient (or if unable, by the patient's family).  Yes   Patient/family informed of their freedom to choose among providers that offer the needed level of care, that participate in Medicare, Medicaid or managed care program needed by the patient, have an available bed and are willing to accept the patient.  Yes   Patient/family informed of [facility]'s ownership interest in [facility] and [facility], as well as of the fact that they are under no obligation to receive care at these facilities.  PASRR submitted to EDS on 05/08/18     PASRR number received on 05/08/18     Existing PASRR number confirmed on       FL2 transmitted to all facilities in geographic area requested by pt/family on 05/08/18     FL2 transmitted to all facilities within larger geographic area on       Patient informed that his/her managed care company has contracts with or will negotiate with certain facilities, including the following:        Yes   Patient/family informed of bed offers received.  Patient chooses bed at [facility] [place]     Physician recommends and patient chooses bed at      Patient to be transferred to [facility] on 05/14/18.  Patient to be transferred to facility by [facility]     Patient family notified on 05/14/18 of transfer.  Name of family member notified:  Spouse     PHYSICIAN       Additional Comment:     _______________________________________________ Benard Halsted, [place] 05/14/2018, 10:17 AM

## 2018-05-14 NOTE — Discharge Summary (Signed)
Physician Discharge Summary  Carol Warren DSK:[id number] DOB: 20-Aug-1946 DOA: 05/04/2018  PCP: Ernestene Kiel, MD  Admit date: 05/04/2018 Discharge date: 05/14/2018  Time spent: 45 minutes  Recommendations for Outpatient Follow-up:  Dr.Byrum, [facility] 5/20 at 2:45pm, needs PET scan followed by biopsy PCP in 1 week [facility] gastroenterology for esophageal manometry  Discharge Diagnoses:  Principal Problem:   Community acquired pneumonia vs Aspiration pneumonia   Esophageal dysmotility   Suspected metastatic lung CA   COPD GOLD 0/ still smoking    Chronic diastolic CHF (congestive heart failure) (HCC)   Mediastinal adenopathy   Metastasis to lymph nodes (HCC)   Mass of both adrenal glands ([place])   CAP (community acquired pneumonia)   Diabetes mellitus   Lung mass      Discharge Condition: improved  Diet recommendation: dysphagia 1 diet, low-sodium and diabetic, meds crushed with pure  Filed Weights   05/13/18 0604 05/13/18 1115 05/14/18 0500  Weight: 61.7 kg (136 lb) 61.7 kg (136 lb) 62.1 kg (137 lb)    History of present illness:  Ms. Carol Warren is a 72 year old female with history of COPD, ongoing tobacco abuse, chronic diastolic and prior systolic CHF admitted with productive cough, fevers and chills and shortness of breath. Of note, she had an outpatient CT scan done by her PCP last month in [place], Dr.Plotnov which was concerning for widespread chest adenopathy and bilateral adrenal nodules concerning for possible advanced lung cance  Hospital Course:  Community acquired pneumonia versus postobstructive -started on Rocephin/zithromax - Blood cultures 5/6 negative so far - off IV Unasyn, transitioned to oral Augmentin, completed 9days of Abx, stop augmentin -leukocytosis persists, suspect this is likely secondary to underlying malignancy which is being worked up, she remains nontoxic and afebrile at this time  Suspected metastatic lung cancer Abnormal recent CT chest  concerning for metastatic disease -Patient CT performed last month per PCP, showed adenopathy in the chest, bilateral axilla, bilateral adrenal masses concerning for metastatic disease, this was done and [place]. -Pulmonology, Dr. Halford Chessman was consulted,  recommended outpatient PET/CT and further management -FU appt with Dr.Byrum for 5/20 at 3pm    COPD Exacerbation GOLD 0/ still smoking  -No wheezing, continue nebs,  -weaned O2 down to 2L now  Aspiration/dysphagia -concern for dysphagia, aspiration noted during hospitalization -SLP evaluation completed, recommended GI evaluation and dysphagia 1 diet -Esophagogram was concerning for distal esophageal stricture,  -[facility] consulted, EGD completed, no stricture noted, concerns for motility disorder -recommended manometry as outpatient -consider speech therapy evaluation in 2-3 weeks again  Abnormal thyroid function -TSH low, normal T4, low T3, ? Sick euthyroid syndrome, repeat TFTs in 1 month  Acute on chronic diastolic CHF (congestive heart failure) ([place]) 2D echo 07/2017 showed 62%, grade 2 diastolic dysfunction Continue to hold Lasix until patient is able to tolerate diet.    Diabetes mellitus, type II uncontrolled -hemoglobin A1c 8.9 -Lantus  started, along with sliding scale insulin  Nicotine abuse - Placed on nicotine patch       Procedures: EGD 5/15 Impression:               - Dilation in the upper third of the esophagus and                            in the middle third of the esophagus likely related  to underlying motility disorder with some food                            stasis noted.                           - Altered texture mucosa in the esophagus. Biopsied.                           - Erythematous mucosa in the gastric body and                            pylorus.                           - Normal examined duodenum.                           - Areas at 35 cm from incisors  successfully                            injected with botulinum toxin    Consultations:  [place] [facility] Dr.Sood  [place] Dr.Karki  Discharge Exam: Vitals:   05/14/18 0639 05/14/18 0752  BP:    Pulse:  80  Resp:  16  Temp: 98.1 F (36.7 C)   SpO2:  98%    General: alert awake oriented 3 Cardiovascular: S1-S2/regular rate rhythm Respiratory: few scattered rhonchi  Discharge Instructions   Discharge Instructions    Diet - low sodium heart healthy   Complete by:  As directed    Increase activity slowly   Complete by:  As directed    Increase activity slowly   Complete by:  As directed      Allergies as of 05/14/2018   No Known Allergies     Medication List    STOP taking these medications   amoxicillin-clavulanate 875-125 MG tablet Commonly known as:  AUGMENTIN   furosemide 40 MG tablet Commonly known as:  LASIX     TAKE these medications   acetaminophen 325 MG tablet Commonly known as:  TYLENOL Take 2 tablets (650 mg total) by mouth every 6 (six) hours as needed for mild pain (or Fever >/= 101).   albuterol 108 (90 Base) MCG/ACT inhaler Commonly known as:  PROVENTIL HFA;VENTOLIN HFA Inhale 1-2 puffs into the lungs every 6 (six) hours as needed for wheezing or shortness of breath (depends on congestion if 1-2 puffs). What changed:  Another medication with the same name was changed. Make sure you understand how and when to take each.   albuterol (2.5 MG/3ML) 0.083% nebulizer solution Commonly known as:  PROVENTIL Take 3 mLs (2.5 mg total) by nebulization every 6 (six) hours as needed for wheezing or shortness of breath. What changed:    when to take this  reasons to take this   aspirin 325 MG EC tablet Take 1 tablet (325 mg total) by mouth daily.   benzonatate 100 MG capsule Commonly known as:  TESSALON Take 1 capsule (100 mg total) by mouth 3 (three) times daily as needed for cough.   CALCIUM-D PO Take 1 tablet by mouth daily.    carboxymethylcellulose 0.5 % Soln Commonly known as:  REFRESH PLUS Place  1 drop into both eyes as needed (dry eyes).   carvedilol 6.25 MG tablet Commonly known as:  COREG Take 1 tablet (6.25 mg total) by mouth 2 (two) times daily. To take with 12.5 mg to make 18.75 mg   carvedilol 12.5 MG tablet Commonly known as:  COREG TAKE 1 TABLET BY MOUTH TWICE DAILY WITH A MEAL   cetirizine 10 MG tablet Commonly known as:  ZYRTEC Take 10 mg by mouth daily.   FLUoxetine 20 MG capsule Commonly known as:  PROZAC Take 20 mg by mouth daily.   fluticasone 50 MCG/ACT nasal spray Commonly known as:  FLONASE Place 1 spray into the nose 2 (two) times daily.   gabapentin 100 MG capsule Commonly known as:  NEURONTIN Take 100 mg by mouth 3 (three) times daily.   GENTEAL TEARS OP Apply 1 drop to eye 3 (three) times daily as needed (dry eyes).   guaiFENesin 600 MG 12 hr tablet Commonly known as:  MUCINEX Take 1 tablet (600 mg total) by mouth 2 (two) times daily.   guaiFENesin-dextromethorphan 100-10 MG/5ML syrup Commonly known as:  ROBITUSSIN DM Take 5 mLs by mouth every 4 (four) hours as needed for cough.   ibuprofen 200 MG tablet Commonly known as:  ADVIL,MOTRIN Take 200 mg by mouth daily as needed for headache or mild pain.   insulin glargine 100 UNIT/ML injection Commonly known as:  LANTUS Inject 0.05 mLs (5 Units total) into the skin daily.   insulin lispro 100 UNIT/ML injection Commonly known as:  HUMALOG Inject 0.01-0.12 mLs (1-12 Units total) into the skin 3 (three) times daily with meals. Moderate sliding scale Insulin   losartan 50 MG tablet Commonly known as:  COZAAR Take 50 mg by mouth daily.   montelukast 10 MG tablet Commonly known as:  SINGULAIR Take 10 mg by mouth at bedtime.   multivitamin capsule Take 1 capsule by mouth daily.   nicotine 21 mg/24hr patch Commonly known as:  NICODERM CQ - dosed in mg/24 hours Place 1 patch (21 mg total) onto the skin daily.    pantoprazole 40 MG tablet Commonly known as:  PROTONIX Take 1 tablet (40 mg total) by mouth daily at 6 (six) AM.   PATANASE 0.6 % Soln Generic drug:  Olopatadine HCl Place 1 spray into the nose daily.   STOOL SOFTENER PO Take 2 tablets by mouth daily as needed (constipation).      No Known Allergies  Contact information for follow-up providers    Collene Gobble, MD .   Specialty:  Pulmonary Disease Contact information: [address] [place] [address] [phone]        Ernestene Kiel, MD. Schedule an appointment as soon as possible for a visit in 1 week(s).   Specialty:  Internal Medicine Contact information: [place]. [place] [address] [phone]            Contact information for after-discharge care    Destination    [facility] .   Service:  Skilled Nursing Contact information: [place] [phone]                   The results of significant diagnostics from this hospitalization (including imaging, microbiology, ancillary and laboratory) are listed below for reference.    Significant Diagnostic Studies: Dg Chest 2 View  Result Date: 05/08/2018 CLINICAL DATA:  Shortness of breath, cough. EXAM: CHEST - 2 VIEW COMPARISON:  Radiograph  of May 04, 2018.  CT scan of April 13, 2018. FINDINGS: Stable cardiomegaly. Right hilar prominence is again noted consistent with adenopathy. Stable right upper lobe opacity is noted concerning for atelectasis or possibly pneumonia. Bony thorax is unremarkable. Left-sided pacemaker is unchanged in position. Status post aortic valve repair. Left lung is clear. Stable right upper lobe airspace opacity is noted. IMPRESSION: Stable right hilar prominence concerning for adenopathy. Stable right upper lobe airspace opacity is noted concerning for postobstructive atelectasis or possibly inflammation. Electronically Signed   By: Marijo Conception, M.D.   On: 05/08/2018 12:52   Ct Head W & Wo Contrast  Result Date: 05/07/2018 CLINICAL DATA:  New onset altered level of consciousness. Lung mass with metastases. Evaluate for brain metastases. EXAM: CT HEAD WITHOUT AND WITH CONTRAST TECHNIQUE: Contiguous axial images were obtained from the base of the skull through the vertex without and with intravenous contrast CONTRAST:  42mL OMNIPAQUE IOHEXOL 300 MG/ML  SOLN COMPARISON:  Sinus CT 04/13/2018.  No prior brain imaging. FINDINGS: Brain: There is no evidence of acute infarct, intracranial hemorrhage, mass, midline shift, or extra-axial fluid collection. Mild cerebral atrophy is not greater than expected for patient's age. No abnormal enhancement is identified. Vascular: Mild calcified atherosclerosis at the skull base. Major dural venous sinuses are grossly patent. Skull: No fracture or suspicious osseous lesion. Sinuses/Orbits: Postsurgical changes in the paranasal sinuses including right maxillary antrostomy. Mild right maxillary sinus mucosal thickening. Clear mastoid air cells. Unremarkable orbits. Other: None. IMPRESSION: Unremarkable CT appearance of the brain for age. No evidence of acute abnormality or metastatic disease. Electronically Signed   By: Logan Bores M.D.   On: 05/07/2018 14:07   Dg Esophagus  Result Date: 05/08/2018 CLINICAL DATA:  Aspiration into airway EXAM: ESOPHOGRAM/BARIUM SWALLOW TECHNIQUE: Single contrast examination was performed using thin barium or water soluble. FLUOROSCOPY TIME:  Fluoroscopy Time:  1.3 minutes Radiation Exposure Index (if provided by the fluoroscopic device): 9.4 mGy Number of Acquired Spot Images: 0 COMPARISON:  Chest CT 04/13/2018 FINDINGS: Limited study. Double contrast and recumbent motility imaging was not considered safe given the degree of stasis described below. Oblique pharyngeal imaging was negative for aspiration, stasis, or collection. Near full column stasis of thin liquid above a smoothly  narrowed GE junction. No specific esophageal dysmotility, including aperistalsis. A 13 mm barium tablet was provided to the patient, but despite multiple attempts she could not pass it be on the mouth. Negative for hiatal hernia. IMPRESSION: 1. Smooth stricture at the GE junction with full column stasis that would slowly drain. A full esophagus was correlated with the patient's episodes of gagging. Despite multiple attempts, the patient could not swallow a 13 mm barium tablet. 2. Negative for aspiration. Electronically Signed   By: Monte Fantasia M.D.   On: 05/08/2018 17:12   Dg Chest Port 1 View  Result Date: 05/04/2018 CLINICAL DATA:  Cough and dyspnea EXAM: PORTABLE CHEST 1 VIEW COMPARISON:  CXR 04/08/2018 and CT 04/13/2018 FINDINGS: Stable cardiomegaly with aortic atherosclerosis. Aortic valvular replacement with median sternotomy sutures are noted. Left-sided ICD device with leads in the right atrium, coronary sinus and right ventricle as before. Edema and/or thickening along the right minor fissure. Coarsened interstitial lung markings with slightly more confluent airspace opacities in the right upper and middle lobes may reflect evolving pneumonia or postobstructive change. Right hilar adenopathy as seen on prior study is less apparent possibly from superimposition of the cardiac silhouette over the hila. No aggressive osseous abnormality.  IMPRESSION: 1. New subpleural areas of airspace opacity in the right upper and middle lobe distribution with coarsened interstitial lung markings. Right upper and middle lobe pneumonia or evolving postobstructive change from known mediastinal and hilar adenopathy are possibilities. Follow-up in 4-6 weeks after treating for pneumonia is recommended. 2. Stable cardiomegaly with aortic atherosclerosis. 3. Status post aortic valvular replacement.  ICD device in place. Electronically Signed   By: Ashley Royalty M.D.   On: 05/04/2018 20:45    Microbiology: Recent Results  (from the past 240 hour(s))  Blood Culture (routine x 2)     Status: None   Collection Time: 05/04/18 10:44 PM  Result Value Ref Range Status   Specimen Description BLOOD RIGHT ANTECUBITAL  Final   Special Requests   Final    BOTTLES DRAWN AEROBIC AND ANAEROBIC Blood Culture adequate volume   Culture   Final    NO GROWTH 5 DAYS Performed at [facility], [address], [place], [place] [address]    Report Status 05/09/2018 FINAL  Final  Culture, sputum-assessment     Status: None   Collection Time: 05/07/18  5:07 AM  Result Value Ref Range Status   Specimen Description EXPECTORATED SPUTUM  Final   Special Requests   Final    NONE Performed at [facility], [place] [address], [place], [place] [address]    Sputum evaluation THIS SPECIMEN IS ACCEPTABLE FOR SPUTUM CULTURE  Final   Report Status 05/07/2018 FINAL  Final  Culture, respiratory (NON-Expectorated)     Status: None   Collection Time: 05/07/18  5:07 AM  Result Value Ref Range Status   Specimen Description EXPECTORATED SPUTUM  Final   Special Requests NONE Reflexed from [id number]  Final   Gram Stain   Final    FEW WBC PRESENT, PREDOMINANTLY PMN FEW GRAM POSITIVE COCCI RARE GRAM NEGATIVE RODS RARE GRAM POSITIVE RODS    Culture   Final    FEW Consistent with normal respiratory flora. Performed at [facility], [place] [address] [place], [place] [address]    Report Status 05/09/2018 FINAL  Final     Labs: Basic Metabolic Panel: Recent Labs  Lab 05/09/18 0443 05/10/18 0441 05/11/18 0609 05/12/18 0447 05/14/18 0523  NA 145 145 144 142 142  K 3.3* 3.1* 4.2 4.4 4.3  CL 91* 94* 95* 93* 94*  CO2 45* 45* 43* 41* 37*  GLUCOSE 169* 87 179* 228* 215*  BUN 21* 16 13 13 11   CREATININE 0.70 0.59 0.67 0.63 0.55  CALCIUM 8.4* 8.2* 8.5* 8.2* 8.3*   Liver Function Tests: No results for input(s): AST, ALT, ALKPHOS, BILITOT, PROT, ALBUMIN in the last 168 hours. No results for input(s): LIPASE, AMYLASE in the last  168 hours. Recent Labs  Lab 05/07/18 1532  AMMONIA 31   CBC: Recent Labs  Lab 05/09/18 0443 05/10/18 0441 05/11/18 0609 05/12/18 0447 05/14/18 0523  WBC 19.5* 19.8* 20.6* 19.7* 19.5*  HGB 9.7* 9.2* 9.7* 9.0* 9.3*  HCT 31.9* 30.3* 32.2* 29.9* 30.1*  MCV 97.6 97.1 99.1 98.0 96.5  PLT 438* 425* 473* 441* 492*   Cardiac Enzymes: No results for input(s): CKTOTAL, CKMB, CKMBINDEX, TROPONINI in the last 168 hours. BNP: BNP (last 3 results) Recent Labs    05/04/18 2031  BNP 1,398.7*    ProBNP (last 3 results) No results for input(s): PROBNP in the last 8760 hours.  CBG: Recent Labs  Lab 05/13/18 0754 05/13/18 1654 05/13/18 2307 05/14/18 0746 05/14/18 0923  GLUCAP 156* 171* 290* 190* 223*       Signed:  Domenic Polite MD.  [facility] 05/14/2018, 10:33 AM

## 2018-05-14 NOTE — Progress Notes (Signed)
Patient will DC to: [facility] Anticipated DC date: 05/14/18 Family notified: Spouse Transport by: Corey Harold 1:30pm   Per MD patient ready for DC to [facility]. RN, patient, patient's family, and facility notified of DC. Discharge Summary sent to facility. RN given number for report [phone] Room 204). DC packet on chart. Ambulance transport requested for patient.   CSW signing off.  Cedric Fishman, LCSW Clinical Social Worker [phone]

## 2018-05-14 NOTE — Progress Notes (Signed)
No noticeable improvement in swallowing since Botox yesterday.  Plan for dischg to SNF noted.  Gave pt my card--if she has significant con'td swallowing issues post dischg, she is welcome to call us to arrange f/u.  OTW, there is no mandate for her to return for GI f/u so I have not made her a f/u appt w/ [place].  Cleotis Nipper, M.D. Pager [phone] If no answer or after 5 PM call [phone]

## 2018-05-18 ENCOUNTER — Telehealth: Payer: Self-pay | Admitting: Cardiology

## 2018-05-18 ENCOUNTER — Ambulatory Visit: Payer: Medicare Other | Admitting: Emergency Medicine

## 2018-05-18 DIAGNOSIS — R918 Other nonspecific abnormal finding of lung field: Secondary | ICD-10-CM

## 2018-05-18 NOTE — Progress Notes (Signed)
Subjective:    Patient ID: Carol Warren, female    DOB: 1946-04-20, 72 y.o.   MRN: [id number]  HPI 72 year old former smoker (pack years) with a history of hypertension, bicuspid aortic valve, aortic aneurysm, AICD, pernicious anemia, GERD.  She was recently admitted to the hospital May 2019 with dyspnea, cough and a suspected COPD exacerbation and postobstructive pneumonia. CT Chest was performed that showed a R hilar mass lesion, widespread mediastinal LAD, bilateral adrenal masses suggestive of mets. She is here today for further management. A PET scan has not yet been done.   She reports that she remains dyspneic, debilitated.   Review of Systems  Past Medical History:  Diagnosis Date  . Adrenal tumor   . AICD (automatic cardioverter/defibrillator) present   . Anxiety   . Aortic aneurysm ([place])   . Asthma   . Bicuspid aortic valve    CONGENITAL  . Cancer ([place])    skin   . Carpal tunnel syndrome, bilateral   . Chronic depression   . Chronic sinusitis   . 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 COPD (chronic obstructive pulmonary disease) ([place])   . Coronary artery disease   . Degenerative disc disease    CERVICAL AND LUMBAR (DR. Lorin Mercy)  . GERD (gastroesophageal reflux disease)   . HSV-1 (herpes simplex virus 1) infection   . HSV-2 (herpes simplex virus 2) infection   . Hypertension   . Insomnia   . Lumbar stenosis   . Osteoarthritis   . Osteopenia   . Pernicious anemia   . Plantar fasciitis   . Smoker   . Venous insufficiency of left leg   . Vitamin B 12 deficiency      Family History  Problem Relation Age of Onset  . Pancreatic cancer Mother 24  . Diabetes Father   . Hypertension Father   . Heart disease Father 68       CAD  . Breast cancer Sister   . Diabetes Maternal Grandmother   . Hypertension Maternal Grandmother   . Cancer Maternal Grandfather        colon or stomach  . Hypertension Paternal Grandmother   . Heart disease Paternal Grandmother        Later onset  . Diverticulosis Paternal Grandmother   . Asthma Grandchild      Social History   Socioeconomic History  . Marital status: Married    Spouse name: Not on file  . Number of children: 2  . Years of education: Not on file  . Highest education level: Not on file  Occupational History  . Occupation: retired    Fish farm manager: RETIRED  Social Needs  . Financial resource strain: Not on file  . Food insecurity:    Worry: Not on file    Inability: Not on file  . Transportation needs:    Medical: Not on file    Non-medical: Not on file  Tobacco Use  . Smoking status: Former Smoker    Packs/day: 0.50    Years: 50.00    Pack years: 25.00    Types: Cigarettes    Last attempt to quit: 05/04/2018    Years since quitting: 0.0  . Smokeless tobacco: Never Used  . Tobacco comment: uses vapor cig  Substance and Sexual Activity  . Alcohol use: No    Alcohol/week: 0.0 oz  . Drug use: No  . Sexual activity: Not on file  Lifestyle  . Physical activity:    Days  per week: Not on file    Minutes per session: Not on file  . Stress: Not on file  Relationships  . Social connections:    Talks on phone: Not on file    Gets together: Not on file    Attends religious service: Not on file    Active member of club or organization: Not on file    Attends meetings of clubs or organizations: Not on file    Relationship status: Not on file  . Intimate partner violence:    Fear of current or ex partner: Not on file    Emotionally abused: Not on file    Physically abused: Not on file    Forced sexual activity: Not on file  Other Topics Concern  . Not on file  Social History Narrative  . Not on file     No Known Allergies   Outpatient Medications Prior to Visit  Medication Sig Dispense Refill  . acetaminophen (TYLENOL) 325 MG tablet Take 2 tablets (650 mg total) by mouth every 6 (six) hours as needed for mild pain (or Fever >/= 101).    [name] albuterol (PROVENTIL HFA;VENTOLIN HFA) 108  (90 BASE) MCG/ACT inhaler Inhale 1-2 puffs into the lungs every 6 (six) hours as needed for wheezing or shortness of breath (depends on congestion if 1-2 puffs).     . albuterol (PROVENTIL) (2.5 MG/3ML) 0.083% nebulizer solution Take 3 mLs (2.5 mg total) by nebulization every 6 (six) hours as needed for wheezing or shortness of breath. 75 mL 12  . Artificial Tear Solution (GENTEAL TEARS OP) Apply 1 drop to eye 3 (three) times daily as needed (dry eyes).    [name] aspirin EC 325 MG EC tablet Take 1 tablet (325 mg total) by mouth daily. 30 tablet 0  . benzonatate (TESSALON) 100 MG capsule Take 1 capsule (100 mg total) by mouth 3 (three) times daily as needed for cough. 20 capsule 0  . Calcium Carbonate-Vitamin D (CALCIUM-D PO) Take 1 tablet by mouth daily.     . carboxymethylcellulose (REFRESH PLUS) 0.5 % SOLN Place 1 drop into both eyes as needed (dry eyes).    . carvedilol (COREG) 12.5 MG tablet TAKE 1 TABLET BY MOUTH TWICE DAILY WITH A MEAL 180 tablet 0  . carvedilol (COREG) 6.25 MG tablet Take 1 tablet (6.25 mg total) by mouth 2 (two) times daily. To take with 12.5 mg to make 18.75 mg 180 tablet 3  . cetirizine (ZYRTEC) 10 MG tablet Take 10 mg by mouth daily.    Mariane Baumgarten Calcium (STOOL SOFTENER PO) Take 2 tablets by mouth daily as needed (constipation).    [name] FLUoxetine (PROZAC) 20 MG capsule Take 20 mg by mouth daily.    . fluticasone (FLONASE) 50 MCG/ACT nasal spray Place 1 spray into the nose 2 (two) times daily.     [name] gabapentin (NEURONTIN) 100 MG capsule Take 100 mg by mouth 3 (three) times daily.    [name] guaiFENesin (MUCINEX) 600 MG 12 hr tablet Take 1 tablet (600 mg total) by mouth 2 (two) times daily.    [name] guaiFENesin-dextromethorphan (ROBITUSSIN DM) 100-10 MG/5ML syrup Take 5 mLs by mouth every 4 (four) hours as needed for cough. 118 mL 0  . ibuprofen (ADVIL,MOTRIN) 200 MG tablet Take 200 mg by mouth daily as needed for headache or mild pain.     [name] insulin glargine (LANTUS) 100 UNIT/ML injection  Inject 0.05 mLs (5 Units total) into the skin daily. 10 mL 0  .  insulin lispro (HUMALOG) 100 UNIT/ML injection Inject 0.01-0.12 mLs (1-12 Units total) into the skin 3 (three) times daily with meals. Moderate sliding scale Insulin 10 mL 0  . losartan (COZAAR) 50 MG tablet Take 50 mg by mouth daily.    . montelukast (SINGULAIR) 10 MG tablet Take 10 mg by mouth at bedtime.    . Multiple Vitamin (MULTIVITAMIN) capsule Take 1 capsule by mouth daily.    . nicotine (NICODERM CQ - DOSED IN MG/24 HOURS) 21 mg/24hr patch Place 1 patch (21 mg total) onto the skin daily. 28 patch 0  . Olopatadine HCl (PATANASE) 0.6 % SOLN Place 1 spray into the nose daily.     . pantoprazole (PROTONIX) 40 MG tablet Take 1 tablet (40 mg total) by mouth daily at 6 (six) AM.     No facility-administered medications prior to visit.         Objective:   Physical Exam Vitals:   05/18/18 1449  BP: (!) 150/80  Pulse: 98  SpO2: 92%   Gen: Pleasant, ill-appearing woman in a wheelchair, in no distress,  normal affect  ENT: No lesions,  mouth clear,  oropharynx clear, no postnasal drip, poor dentition  Neck: No JVD, no stridor  Lungs: Distant, few bilateral scattered expiratory wheezes, no crackles  Cardiovascular: RRR, heart sounds normal, no murmur or gallops, 1+ peripheral edema  Musculoskeletal: she is wearing a back brace  Neuro: alert, non focal, poor insight into disease.   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  I suspect that the adrenal glands would be the best target for tissue diagnosis, would establish stage IV disease if positive.  We need to get a PET scan first to help guide planning for tissue diagnosis.  If the adrenal glands are a good target and I will refer her to interventional radiology to have this done.  If not then we may need to consider endobronchial  ultrasound.  Baltazar Apo, MD, PhD 05/18/2018, 3:38 PM [facility] [phone] or if no answer [phone]

## 2018-05-18 NOTE — Patient Instructions (Signed)
We need to arrange for a PET scan to better characterize the findings in your chest, lymph nodes and possibly elsewhere. Depending on the results we will plan a tissue biopsy of either the lung or elsewhere to determine the cause of these abnormalities.  Follow with Dr Lamonte Sakai next available to discuss the results and plan next steps.

## 2018-05-18 NOTE — Telephone Encounter (Signed)
LMOVM reminding pt to send remote transmission.   

## 2018-05-18 NOTE — Assessment & Plan Note (Signed)
Right hilar mass with associated mediastinal lymphadenopathy, also probably adrenal metastatic disease.  I suspect that the adrenal glands would be the best target for tissue diagnosis, would establish stage IV disease if positive.  We need to get a PET scan first to help guide planning for tissue diagnosis.  If the adrenal glands are a good target and I will refer her to interventional radiology to have this done.  If not then we may need to consider endobronchial ultrasound.

## 2018-05-22 ENCOUNTER — Other Ambulatory Visit: Payer: Self-pay | Admitting: Gastroenterology

## 2018-05-25 ENCOUNTER — Encounter (HOSPITAL_COMMUNITY): Payer: Self-pay

## 2018-05-25 ENCOUNTER — Emergency Department (HOSPITAL_COMMUNITY): Payer: Medicare Other

## 2018-05-25 ENCOUNTER — Inpatient Hospital Stay (HOSPITAL_COMMUNITY)
Admission: EM | Admit: 2018-05-25 | Discharge: 2018-05-30 | DRG: 180 | Disposition: E | Payer: Medicare Other | Attending: Internal Medicine | Admitting: Internal Medicine

## 2018-05-25 ENCOUNTER — Other Ambulatory Visit: Payer: Self-pay

## 2018-05-25 DIAGNOSIS — Z7951 Long term (current) use of inhaled steroids: Secondary | ICD-10-CM

## 2018-05-25 DIAGNOSIS — I428 Other cardiomyopathies: Secondary | ICD-10-CM | POA: Diagnosis present

## 2018-05-25 DIAGNOSIS — I5042 Chronic combined systolic (congestive) and diastolic (congestive) heart failure: Secondary | ICD-10-CM | POA: Diagnosis present

## 2018-05-25 DIAGNOSIS — Z85828 Personal history of other malignant neoplasm of skin: Secondary | ICD-10-CM

## 2018-05-25 DIAGNOSIS — C779 Secondary and unspecified malignant neoplasm of lymph node, unspecified: Secondary | ICD-10-CM | POA: Diagnosis present

## 2018-05-25 DIAGNOSIS — Z95810 Presence of automatic (implantable) cardiac defibrillator: Secondary | ICD-10-CM

## 2018-05-25 DIAGNOSIS — J440 Chronic obstructive pulmonary disease with acute lower respiratory infection: Secondary | ICD-10-CM | POA: Diagnosis present

## 2018-05-25 DIAGNOSIS — C7971 Secondary malignant neoplasm of right adrenal gland: Secondary | ICD-10-CM | POA: Diagnosis present

## 2018-05-25 DIAGNOSIS — F17210 Nicotine dependence, cigarettes, uncomplicated: Secondary | ICD-10-CM | POA: Diagnosis present

## 2018-05-25 DIAGNOSIS — Z8589 Personal history of malignant neoplasm of other organs and systems: Secondary | ICD-10-CM | POA: Diagnosis not present

## 2018-05-25 DIAGNOSIS — Z95828 Presence of other vascular implants and grafts: Secondary | ICD-10-CM | POA: Diagnosis not present

## 2018-05-25 DIAGNOSIS — Z800 Family history of malignant neoplasm of digestive organs: Secondary | ICD-10-CM | POA: Diagnosis not present

## 2018-05-25 DIAGNOSIS — Z66 Do not resuscitate: Secondary | ICD-10-CM | POA: Diagnosis present

## 2018-05-25 DIAGNOSIS — R739 Hyperglycemia, unspecified: Secondary | ICD-10-CM | POA: Diagnosis present

## 2018-05-25 DIAGNOSIS — Y95 Nosocomial condition: Secondary | ICD-10-CM | POA: Diagnosis present

## 2018-05-25 DIAGNOSIS — I2510 Atherosclerotic heart disease of native coronary artery without angina pectoris: Secondary | ICD-10-CM | POA: Diagnosis present

## 2018-05-25 DIAGNOSIS — Z952 Presence of prosthetic heart valve: Secondary | ICD-10-CM

## 2018-05-25 DIAGNOSIS — J9620 Acute and chronic respiratory failure, unspecified whether with hypoxia or hypercapnia: Secondary | ICD-10-CM | POA: Diagnosis present

## 2018-05-25 DIAGNOSIS — K219 Gastro-esophageal reflux disease without esophagitis: Secondary | ICD-10-CM | POA: Diagnosis present

## 2018-05-25 DIAGNOSIS — I5032 Chronic diastolic (congestive) heart failure: Secondary | ICD-10-CM | POA: Diagnosis present

## 2018-05-25 DIAGNOSIS — Z515 Encounter for palliative care: Secondary | ICD-10-CM | POA: Diagnosis not present

## 2018-05-25 DIAGNOSIS — Q231 Congenital insufficiency of aortic valve: Secondary | ICD-10-CM

## 2018-05-25 DIAGNOSIS — Z794 Long term (current) use of insulin: Secondary | ICD-10-CM

## 2018-05-25 DIAGNOSIS — J189 Pneumonia, unspecified organism: Secondary | ICD-10-CM | POA: Diagnosis present

## 2018-05-25 DIAGNOSIS — D510 Vitamin B12 deficiency anemia due to intrinsic factor deficiency: Secondary | ICD-10-CM | POA: Diagnosis present

## 2018-05-25 DIAGNOSIS — C7972 Secondary malignant neoplasm of left adrenal gland: Secondary | ICD-10-CM | POA: Diagnosis present

## 2018-05-25 DIAGNOSIS — Z8701 Personal history of pneumonia (recurrent): Secondary | ICD-10-CM | POA: Diagnosis not present

## 2018-05-25 DIAGNOSIS — Z7982 Long term (current) use of aspirin: Secondary | ICD-10-CM

## 2018-05-25 DIAGNOSIS — C3491 Malignant neoplasm of unspecified part of right bronchus or lung: Secondary | ICD-10-CM | POA: Diagnosis not present

## 2018-05-25 DIAGNOSIS — I110 Hypertensive heart disease with heart failure: Secondary | ICD-10-CM | POA: Diagnosis present

## 2018-05-25 DIAGNOSIS — I350 Nonrheumatic aortic (valve) stenosis: Secondary | ICD-10-CM

## 2018-05-25 DIAGNOSIS — Z803 Family history of malignant neoplasm of breast: Secondary | ICD-10-CM

## 2018-05-25 DIAGNOSIS — R0603 Acute respiratory distress: Secondary | ICD-10-CM

## 2018-05-25 DIAGNOSIS — Z8249 Family history of ischemic heart disease and other diseases of the circulatory system: Secondary | ICD-10-CM

## 2018-05-25 DIAGNOSIS — Z833 Family history of diabetes mellitus: Secondary | ICD-10-CM

## 2018-05-25 DIAGNOSIS — J449 Chronic obstructive pulmonary disease, unspecified: Secondary | ICD-10-CM | POA: Diagnosis present

## 2018-05-25 DIAGNOSIS — C771 Secondary and unspecified malignant neoplasm of intrathoracic lymph nodes: Secondary | ICD-10-CM | POA: Diagnosis present

## 2018-05-25 DIAGNOSIS — Z79899 Other long term (current) drug therapy: Secondary | ICD-10-CM

## 2018-05-25 LAB — CBC WITH DIFFERENTIAL/PLATELET
BASOS ABS: 0 10*3/uL (ref 0.0–0.1)
Basophils Relative: 0 %
EOS PCT: 0 %
Eosinophils Absolute: 0 10*3/uL (ref 0.0–0.7)
HEMATOCRIT: 27.3 % — AB (ref 36.0–46.0)
HEMOGLOBIN: 8.2 g/dL — AB (ref 12.0–15.0)
LYMPHS ABS: 0.5 10*3/uL — AB (ref 0.7–4.0)
Lymphocytes Relative: 6 %
MCH: 29.3 pg (ref 26.0–34.0)
MCHC: 30 g/dL (ref 30.0–36.0)
MCV: 97.5 fL (ref 78.0–100.0)
MONO ABS: 0.3 10*3/uL (ref 0.1–1.0)
MONOS PCT: 3 %
Neutro Abs: 8 10*3/uL — ABNORMAL HIGH (ref 1.7–7.7)
Neutrophils Relative %: 91 %
PLATELETS: 424 10*3/uL — AB (ref 150–400)
RBC: 2.8 MIL/uL — ABNORMAL LOW (ref 3.87–5.11)
RDW: 15.6 % — ABNORMAL HIGH (ref 11.5–15.5)
WBC: 8.8 10*3/uL (ref 4.0–10.5)

## 2018-05-25 LAB — COMPREHENSIVE METABOLIC PANEL
ALT: 72 U/L — ABNORMAL HIGH (ref 14–54)
ANION GAP: 10 (ref 5–15)
AST: 38 U/L (ref 15–41)
Albumin: 2 g/dL — ABNORMAL LOW (ref 3.5–5.0)
Alkaline Phosphatase: 153 U/L — ABNORMAL HIGH (ref 38–126)
BILIRUBIN TOTAL: 0.3 mg/dL (ref 0.3–1.2)
BUN: 14 mg/dL (ref 6–20)
CHLORIDE: 91 mmol/L — AB (ref 101–111)
CO2: 40 mmol/L — ABNORMAL HIGH (ref 22–32)
Calcium: 8.2 mg/dL — ABNORMAL LOW (ref 8.9–10.3)
Creatinine, Ser: 0.46 mg/dL (ref 0.44–1.00)
Glucose, Bld: 76 mg/dL (ref 65–99)
POTASSIUM: 3.4 mmol/L — AB (ref 3.5–5.1)
Sodium: 141 mmol/L (ref 135–145)
TOTAL PROTEIN: 5.6 g/dL — AB (ref 6.5–8.1)

## 2018-05-25 LAB — I-STAT ARTERIAL BLOOD GAS, ED
ACID-BASE EXCESS: 22 mmol/L — AB (ref 0.0–2.0)
BICARBONATE: 45.6 mmol/L — AB (ref 20.0–28.0)
O2 SAT: 100 %
TCO2: 47 mmol/L — AB (ref 22–32)
pCO2 arterial: 46.3 mmHg (ref 32.0–48.0)
pH, Arterial: 7.601 (ref 7.350–7.450)
pO2, Arterial: 169 mmHg — ABNORMAL HIGH (ref 83.0–108.0)

## 2018-05-25 LAB — HEMOGLOBIN A1C
HEMOGLOBIN A1C: 8.6 % — AB (ref 4.8–5.6)
Mean Plasma Glucose: 200.12 mg/dL

## 2018-05-25 LAB — MAGNESIUM: MAGNESIUM: 2 mg/dL (ref 1.7–2.4)

## 2018-05-25 LAB — I-STAT CG4 LACTIC ACID, ED: LACTIC ACID, VENOUS: 0.98 mmol/L (ref 0.5–1.9)

## 2018-05-25 MED ORDER — ONDANSETRON HCL 4 MG/2ML IJ SOLN: 4.0000 mg | Freq: Four times a day (QID) | INTRAMUSCULAR | Status: DC | PRN

## 2018-05-25 MED ORDER — ALBUTEROL SULFATE (2.5 MG/3ML) 0.083% IN NEBU: 2.5000 mg | INHALATION_SOLUTION | RESPIRATORY_TRACT | Status: DC | PRN

## 2018-05-25 MED ORDER — CALCIUM-D 600-400 MG-UNIT PO TABS: ORAL_TABLET | Freq: Every day | ORAL | Status: DC

## 2018-05-25 MED ORDER — HALOPERIDOL 0.5 MG PO TABS
0.5000 mg | ORAL_TABLET | ORAL | Status: DC | PRN
  Filled 2018-05-25: qty 1

## 2018-05-25 MED ORDER — CEFEPIME HCL 1 G IJ SOLR
1.0000 g | Freq: Three times a day (TID) | INTRAMUSCULAR | Status: DC
  Filled 2018-05-25 (×2): qty 1

## 2018-05-25 MED ORDER — GENTEAL TEARS 0.1-0.2-0.3 % OP SOLN: 1.0000 [drp] | Freq: Three times a day (TID) | OPHTHALMIC | Status: DC | PRN

## 2018-05-25 MED ORDER — INSULIN ASPART 100 UNIT/ML ~~LOC~~ SOLN: 0.0000 [IU] | Freq: Three times a day (TID) | SUBCUTANEOUS | Status: DC

## 2018-05-25 MED ORDER — PIPERACILLIN-TAZOBACTAM 3.375 G IVPB 30 MIN
3.3750 g | Freq: Once | INTRAVENOUS | Status: AC
  Administered 2018-05-25: 3.375 g via INTRAVENOUS
  Filled 2018-05-25: qty 50

## 2018-05-25 MED ORDER — BENZONATATE 100 MG PO CAPS: 100.0000 mg | ORAL_CAPSULE | Freq: Three times a day (TID) | ORAL | Status: DC | PRN

## 2018-05-25 MED ORDER — LORAZEPAM 2 MG/ML IJ SOLN: 1.0000 mg | INTRAMUSCULAR | Status: DC | PRN

## 2018-05-25 MED ORDER — ONDANSETRON HCL 4 MG PO TABS: 4.0000 mg | ORAL_TABLET | Freq: Four times a day (QID) | ORAL | Status: DC | PRN

## 2018-05-25 MED ORDER — CARVEDILOL 12.5 MG PO TABS: 12.5000 mg | ORAL_TABLET | Freq: Two times a day (BID) | ORAL | Status: DC

## 2018-05-25 MED ORDER — LORAZEPAM 1 MG PO TABS: 1.0000 mg | ORAL_TABLET | ORAL | Status: DC | PRN

## 2018-05-25 MED ORDER — HALOPERIDOL LACTATE 5 MG/ML IJ SOLN: 0.5000 mg | INTRAMUSCULAR | Status: DC | PRN

## 2018-05-25 MED ORDER — PANTOPRAZOLE SODIUM 40 MG PO TBEC: 40.0000 mg | DELAYED_RELEASE_TABLET | Freq: Every day | ORAL | Status: DC

## 2018-05-25 MED ORDER — ENOXAPARIN SODIUM 40 MG/0.4ML ~~LOC~~ SOLN: 40.0000 mg | SUBCUTANEOUS | Status: DC

## 2018-05-25 MED ORDER — GLYCOPYRROLATE 0.2 MG/ML IJ SOLN: 0.2000 mg | INTRAMUSCULAR | Status: DC | PRN

## 2018-05-25 MED ORDER — LOSARTAN POTASSIUM 50 MG PO TABS: 50.0000 mg | ORAL_TABLET | Freq: Every day | ORAL | Status: DC

## 2018-05-25 MED ORDER — LORAZEPAM 2 MG/ML PO CONC: 1.0000 mg | ORAL | Status: DC | PRN

## 2018-05-25 MED ORDER — MORPHINE SULFATE (PF) 4 MG/ML IV SOLN: 2.0000 mg | INTRAVENOUS | Status: DC | PRN

## 2018-05-25 MED ORDER — BIOTENE DRY MOUTH MT LIQD: 15.0000 mL | OROMUCOSAL | Status: DC | PRN

## 2018-05-25 MED ORDER — ONDANSETRON 4 MG PO TBDP: 4.0000 mg | ORAL_TABLET | Freq: Four times a day (QID) | ORAL | Status: DC | PRN

## 2018-05-25 MED ORDER — POLYVINYL ALCOHOL 1.4 % OP SOLN
1.0000 [drp] | Freq: Four times a day (QID) | OPHTHALMIC | Status: DC | PRN
  Filled 2018-05-25: qty 15

## 2018-05-25 MED ORDER — VANCOMYCIN HCL 10 G IV SOLR
1250.0000 mg | Freq: Once | INTRAVENOUS | Status: AC
  Administered 2018-05-25: 1250 mg via INTRAVENOUS
  Filled 2018-05-25: qty 1250

## 2018-05-25 MED ORDER — SODIUM CHLORIDE 0.9 % IV SOLN: INTRAVENOUS | Status: DC

## 2018-05-25 MED ORDER — VANCOMYCIN HCL 500 MG IV SOLR
500.0000 mg | Freq: Two times a day (BID) | INTRAVENOUS | Status: DC
  Filled 2018-05-25: qty 500

## 2018-05-25 MED ORDER — ALBUTEROL SULFATE (2.5 MG/3ML) 0.083% IN NEBU
2.5000 mg | INHALATION_SOLUTION | Freq: Four times a day (QID) | RESPIRATORY_TRACT | Status: DC
  Administered 2018-05-25: 2.5 mg via RESPIRATORY_TRACT
  Filled 2018-05-25: qty 3

## 2018-05-25 MED ORDER — ACETAMINOPHEN 325 MG PO TABS: 650.0000 mg | ORAL_TABLET | Freq: Four times a day (QID) | ORAL | Status: DC | PRN

## 2018-05-25 MED ORDER — NICOTINE 21 MG/24HR TD PT24: 21.0000 mg | MEDICATED_PATCH | Freq: Every day | TRANSDERMAL | Status: DC

## 2018-05-25 MED ORDER — LORATADINE 10 MG PO TABS: 10.0000 mg | ORAL_TABLET | Freq: Every day | ORAL | Status: DC

## 2018-05-25 MED ORDER — ALBUTEROL SULFATE (2.5 MG/3ML) 0.083% IN NEBU
5.0000 mg | INHALATION_SOLUTION | Freq: Once | RESPIRATORY_TRACT | Status: AC
  Administered 2018-05-25: 5 mg via RESPIRATORY_TRACT
  Filled 2018-05-25: qty 6

## 2018-05-25 MED ORDER — GLYCOPYRROLATE 1 MG PO TABS
1.0000 mg | ORAL_TABLET | ORAL | Status: DC | PRN
  Filled 2018-05-25: qty 1

## 2018-05-25 MED ORDER — CARVEDILOL 12.5 MG PO TABS: 6.2500 mg | ORAL_TABLET | Freq: Two times a day (BID) | ORAL | Status: DC

## 2018-05-25 MED ORDER — ACETAMINOPHEN 650 MG RE SUPP: 650.0000 mg | Freq: Four times a day (QID) | RECTAL | Status: DC | PRN

## 2018-05-25 MED ORDER — HALOPERIDOL LACTATE 2 MG/ML PO CONC
0.5000 mg | ORAL | Status: DC | PRN
  Filled 2018-05-25: qty 0.3

## 2018-05-25 MED ORDER — MONTELUKAST SODIUM 10 MG PO TABS: 10.0000 mg | ORAL_TABLET | Freq: Every day | ORAL | Status: DC

## 2018-05-25 MED ORDER — METOPROLOL TARTRATE 5 MG/5ML IV SOLN: 5.0000 mg | Freq: Four times a day (QID) | INTRAVENOUS | Status: DC

## 2018-05-25 MED ORDER — DOCUSATE SODIUM 100 MG PO CAPS: 100.0000 mg | ORAL_CAPSULE | Freq: Every day | ORAL | Status: DC | PRN

## 2018-05-25 MED ORDER — FUROSEMIDE 10 MG/ML IJ SOLN: 20.0000 mg | Freq: Every day | INTRAMUSCULAR | Status: DC

## 2018-05-25 MED ORDER — MORPHINE SULFATE (PF) 4 MG/ML IV SOLN
1.0000 mg | INTRAVENOUS | Status: DC | PRN
  Administered 2018-05-25: 1 mg via INTRAVENOUS
  Filled 2018-05-25: qty 1

## 2018-05-25 MED ORDER — INSULIN GLARGINE 100 UNIT/ML ~~LOC~~ SOLN: 5.0000 [IU] | Freq: Every day | SUBCUTANEOUS | Status: DC

## 2018-05-25 MED ORDER — KETOROLAC TROMETHAMINE 15 MG/ML IJ SOLN: 15.0000 mg | Freq: Four times a day (QID) | INTRAMUSCULAR | Status: DC | PRN

## 2018-05-25 MED ORDER — GUAIFENESIN-DM 100-10 MG/5ML PO SYRP: 5.0000 mL | ORAL_SOLUTION | ORAL | Status: DC | PRN

## 2018-05-25 MED ORDER — DIPHENHYDRAMINE HCL 50 MG/ML IJ SOLN: 12.5000 mg | INTRAMUSCULAR | Status: DC | PRN

## 2018-05-25 MED ORDER — FLUOXETINE HCL 20 MG PO CAPS: 20.0000 mg | ORAL_CAPSULE | Freq: Every day | ORAL | Status: DC

## 2018-05-25 MED ORDER — OLOPATADINE HCL 0.6 % NA SOLN: 1.0000 | Freq: Every day | NASAL | Status: DC

## 2018-05-25 MED ORDER — ASPIRIN EC 325 MG PO TBEC: 325.0000 mg | DELAYED_RELEASE_TABLET | Freq: Every day | ORAL | Status: DC

## 2018-05-25 MED ORDER — MORPHINE SULFATE (PF) 4 MG/ML IV SOLN: 1.0000 mg | INTRAVENOUS | Status: DC | PRN

## 2018-05-25 MED ORDER — INSULIN ASPART 100 UNIT/ML ~~LOC~~ SOLN: 0.0000 [IU] | Freq: Every day | SUBCUTANEOUS | Status: DC

## 2018-05-25 MED ORDER — GABAPENTIN 100 MG PO CAPS: 100.0000 mg | ORAL_CAPSULE | Freq: Three times a day (TID) | ORAL | Status: DC

## 2018-05-27 ENCOUNTER — Ambulatory Visit (HOSPITAL_COMMUNITY): Payer: Medicare Other

## 2018-05-30 LAB — CULTURE, BLOOD (ROUTINE X 2)
CULTURE: NO GROWTH
CULTURE: NO GROWTH
Special Requests: ADEQUATE

## 2018-05-30 NOTE — ED Notes (Signed)
Admitting MD at bedside.

## 2018-05-30 NOTE — ED Notes (Addendum)
Pt noted to be more lethargic, more difficult to rouse, and having increased work of breathing; attempted to wean off of NRB per request of 43M; pt immediately desaturated to 88% w/ increased work of breathing;MD notified; MD at bedside drawing ABG

## 2018-05-30 NOTE — ED Triage Notes (Signed)
Pt borught in from [facility] via EMS; pt's family called EMS d/t pt's respiratory distress; pt recently hospitalized and treated for double pneumonia; pt received 1 breathing tx PTA; pt has pacemaker; lethargic but A &O x 4 w/ EMS  99%4L 108 HR 152/89 CBG 91 RR 24

## 2018-05-30 NOTE — Progress Notes (Signed)
Pharmacy Antibiotic Note  RAVNEET SPILKER is a 72 y.o. female admitted on Jun 02, 2018 with pneumonia. Pharmacy has been consulted for vancomycin dosing.  Given vancomycin load dose in the ED.   Plan: Start vancomycin 500mg  IV Q12h Start cefepime 1g IV Q8h per MD Monitor clinical picture, renal function, VT prn F/U C&S, abx deescalation / LOT   Height: 5\' 1"  (154.9 cm) Weight: 137 lb (62.1 kg) IBW/kg (Calculated) : 47.8  Temp (24hrs), Avg:98.6 F (37 C), Min:98.6 F (37 C), Max:98.6 F (37 C)  Recent Labs  Lab Jun 02, 2018 1150 June 02, 2018 1219  WBC 8.8  --   CREATININE 0.46  --   LATICACIDVEN  --  0.98    Estimated Creatinine Clearance: 54.5 mL/min (by C-G formula based on SCr of 0.46 mg/dL).    No Known Allergies   Thank you for allowing pharmacy to be a part of this patient's care.  Reginia Naas 06/02/2018 1:39 PM

## 2018-05-30 NOTE — ED Notes (Signed)
Family at bedside. 

## 2018-05-30 NOTE — ED Notes (Signed)
Per family, pt is a full body donation to [facility]; 6N RN made aware

## 2018-05-30 NOTE — ED Notes (Signed)
Pt's O2 sats and BP noted to be trending down; MD notified

## 2018-05-30 NOTE — Death Summary Note (Signed)
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 Recently discharged after a 10 day stay for community-acquired pneumonia versus aspiration pneumonia. During that hospitalization she had a CT of the chest concerning for metastatic lung disease as well. He was discharged to facility and had progressive respiratory failure since that time. Upon family visit today they noted increased work of breathing and decreased level of consciousness. EMS was called she was transported to the hospital.   Hospital Course:  1.Acute respiratory distress likely related to right hilar mass with associated mediastinal lymphadenopathy and probably adrenal metastatic disease versus healthcare associated pneumonia in the setting of chronic respiratory failure related to COPD.  Chest x-ray with extensive airspace disease throughout the right lung associated with right pleural effusion as well as central vascular congestion bilaterally. Patient afebrile no leukocytosis.ABG pH is 7.6 PCO2 46,02 169 bicarbonate 45. Lactic acid within the limits of normalshe received nebulizers as well as IV antibiotics. respiratory effort continued to decline. Repeat ABG revealed a pH of 7.1 PO2 90 PCO2  undetectable despite NRB O2. Of note she also had poor evaluation Dr. Malvin Johns who opined is likely related to underlying cancer with adrenal metastases- stage IV CA. She was not a candidate for therapy at this time Given her markedly respiratory failure discussed with family and decision made transfer to convert measures. Patient was transferred from the emergency department to a bed on a MedSurg floor. When transferred from stretcher to bed it was noted respirations no pulse.     Time 30 minutes  Signed:  Radene Gunning  [facility] 05/26/2018, 8:14 AM

## 2018-05-30 NOTE — Progress Notes (Signed)
Nursing reports increased lethargy and increased RR.   ABG with ph 7.1                 p02 91                 PCo2 undectable   Discussed with family comfort care given recent diagnoses and current worsening. Husband verbalizes understanding of diagnosis.   Comfort care measures initiated.      Santiago Glad Federico Maiorino np

## 2018-05-30 NOTE — ED Provider Notes (Addendum)
[facility] EMERGENCY DEPARTMENT Provider Note   CSN: [id number] Arrival date & time: 06/13/2018  1126     History   Chief Complaint Chief Complaint  Patient presents with  . Respiratory Distress    HPI Carol Warren is a 72 y.o. female.  72 yo F with a chief complaint of shortness of breath.  Patient was recently in the hospital for a bilateral pneumonia and was discharged back to her skilled nursing facility.  While there she has had worsening shortness breath over the past couple days.  Was having worsening confusion and so they called 911.  She denies chest pain denies lower extremity edema denies fevers or chills.  The history is provided by the patient.  Illness  This is a recurrent problem. The current episode started 2 days ago. The problem occurs constantly. The problem has been gradually worsening. Associated symptoms include shortness of breath. Pertinent negatives include no chest pain and no headaches. Nothing aggravates the symptoms. Nothing relieves the symptoms. She has tried nothing for the symptoms. The treatment provided no relief.    Past Medical History:  Diagnosis Date  . Adrenal tumor   . AICD (automatic cardioverter/defibrillator) present   . Anxiety   . Aortic aneurysm ([place])   . Asthma   . Bicuspid aortic valve    CONGENITAL  . Cancer ([place])    skin   . Carpal tunnel syndrome, bilateral   . Chronic depression   . Chronic sinusitis   . 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 COPD (chronic obstructive pulmonary disease) ([place])   . Coronary artery disease   . Degenerative disc disease    CERVICAL AND LUMBAR (DR. Lorin Mercy)  . GERD (gastroesophageal reflux disease)   . HSV-1 (herpes simplex virus 1) infection   . HSV-2 (herpes simplex virus 2) infection   . Hypertension   . Insomnia   . Lumbar stenosis   . Osteoarthritis   . Osteopenia   . Pernicious anemia   . Plantar fasciitis   . Smoker   . Venous insufficiency of left leg     . Vitamin B 12 deficiency     Patient Active Problem List   Diagnosis Date Noted  . Acute respiratory distress 06-13-18  . Lung mass   . Community acquired pneumonia 05/05/2018  . Mediastinal adenopathy 05/05/2018  . 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 Mass of both adrenal glands ([place]) 05/05/2018  . CAP (community acquired pneumonia) 05/05/2018  . Hyperglycemia 05/05/2018  . Acute on chronic diastolic CHF (congestive heart failure) ([place])   . Mucoid cyst of joint 11/20/2016  . Osteoarthritis of finger of right hand 11/20/2016  . Spondylolisthesis of lumbar region 08/30/2016    Class: Chronic  . DDD (degenerative disc disease), lumbar 08/30/2016  . Spinal stenosis, lumbar region, with neurogenic claudication 08/30/2016    Class: Chronic  . Lumbar degenerative disc disease 08/30/2016  . Dizziness 06/12/2015  . ICD (implantable cardioverter-defibrillator), biventricular, in situ 02/16/2015  . Chronic diastolic CHF (congestive heart failure) ([place]) 02/16/2015  . Nonischemic cardiomyopathy ([place]) 01/19/2015  . Anemia, secondary to surgery 01/18/2015  . B12 deficiency 01/18/2015  . CHB (complete heart block)- post op 01/18/2015  . 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 Severe aortic stenosis 11/14/2014  . Pulmonary hypertension due to left ventricular systolic dysfunction ([place]) 11/09/2014  . Personal history of colonic polyps 07/30/2013  . COPD GOLD 0/ still smoking  07/07/2013  . CIS (carcinoma in situ)   . Osteopenia   . HSV-1 (herpes simplex virus 1) infection   . HSV-2 (herpes simplex virus 2) infection   . Bicuspid aortic valve   . Aortic aneurysm ([place])   . Adrenal tumor   . Degenerative disc disease   . Smoker     Past Surgical History:  Procedure Laterality Date  . AORTIC VALVE REPLACEMENT N/A 01/12/2015   Procedure: AORTIC VALVE REPLACEMENT (AVR);  Surgeon: Gaye Pollack, MD;  Location: [facility];  Service:  Open Heart Surgery;  Laterality: N/A;  . ASCENDING AORTIC ROOT REPLACEMENT N/A 01/12/2015   Procedure: ASCENDING AORTIC ROOT REPLACEMENT;  Surgeon: Gaye Pollack, MD;  Location: [facility];  Service: Open Heart Surgery;  Laterality: N/A;  . BI-VENTRICULAR PACEMAKER INSERTION N/A 01/18/2015   Procedure: BI-VENTRICULAR PACEMAKER INSERTION (CRT-P);  Surgeon: Evans Lance, MD;  Location[facility] C[facility] LAB;  Service: Cardiovascular;  Laterality: N/A;  . BIOPSY N/A 05/13/2018   Procedure: BIOPSY;  Surgeon: Ronnette Juniper, MD;  Location: [facility];  Service: Gastroenterology;  Laterality: N/A;  . BOTOX INJECTION  05/13/2018   Procedure: BOTOX INJECTION;  Surgeon: Ronnette Juniper, MD;  Location: [facility];  Service: Gastroenterology;;  . Lillard Anes WITH HAMMERTOE RECONSTRUCTION Right   . CARPAL TUNNEL RELEASE Bilateral 2008  . CATARACT EXTRACTION Bilateral    X2  . CATARACT EXTRACTION W/ INTRAOCULAR LENS  IMPLANT, BILATERAL    . COLONOSCOPY    . CYST EXCISION     right side of throat  . ESOPHAGOGASTRODUODENOSCOPY (EGD) WITH PROPOFOL N/A 05/13/2018   Procedure: ESOPHAGOGASTRODUODENOSCOPY (EGD) WITH PROPOFOL;  Surgeon: Ronnette Juniper, MD;  Location: [facility];  Service: Gastroenterology;  Laterality: N/A;  possible through-the-scope balloon dilatation, possible Botox injection  . EXCISION OF BASAL CELL CA     SKIN   . EXCISION OF VULVAR CIS    . EYE SURGERY    . FUNCTIONAL ENDOSCOPIC SINUS SURGERY    . LEFT AND RIGHT HEART CATHETERIZATION WITH CORONARY ANGIOGRAM N/A 09/23/2014   Procedure: LEFT AND RIGHT HEART CATHETERIZATION WITH CORONARY ANGIOGRAM;  Surgeon: Minus Breeding, MD;  Location: [facility] CATH LAB;  Service: Cardiovascular;  Laterality: N/A;  . MASS EXCISION Right 04/03/2017   Procedure: EXCISION CYST DEBRIDMENT DISTAL INTERPHALANEAL RIGHT INDEX FINGER;  Surgeon: Daryll Brod, MD;  Location: [facility];  Service: Orthopedics;  Laterality: Right;  REG/FAB  . MULTIPLE TOOTH EXTRACTIONS    . SINUS PROCEDURE  2009    x 6  . TEE WITHOUT CARDIOVERSION N/A 10/06/2014   Procedure: TRANSESOPHAGEAL ECHOCARDIOGRAM (TEE);  Surgeon: Sueanne Margarita, MD;  Location: [facility] ENDOSCOPY;  Service: Cardiovascular;  Laterality: N/A;  . TEE WITHOUT CARDIOVERSION N/A 01/12/2015   Procedure: TRANSESOPHAGEAL ECHOCARDIOGRAM (TEE);  Surgeon: Gaye Pollack, MD;  Location: [facility];  Service: Open Heart Surgery;  Laterality: N/A;  . UPPER GASTROINTESTINAL ENDOSCOPY       OB History    Gravida  2   Para  2   Term      Preterm      AB      Living  2     SAB      TAB      Ectopic      Multiple      Live Births               Home Medications    Prior to Admission medications   Medication Sig Start Date End Date Taking? Authorizing Provider  acetaminophen (TYLENOL) 325 MG tablet Take 2 tablets (650 mg total) by mouth every 6 (six) hours as needed for mild pain (or Fever >/= 101). 05/14/18   Domenic Polite, MD  albuterol (PROVENTIL HFA;VENTOLIN HFA) 108 (90 BASE) MCG/ACT inhaler Inhale 1-2 puffs into the lungs every 6 (six) hours as needed for wheezing or shortness of breath (depends on congestion if 1-2 puffs).     [provider]  albuterol (PROVENTIL) (2.5 MG/3ML) 0.083% nebulizer solution Take 3 mLs (2.5 mg total) by nebulization every 6 (six) hours as needed for wheezing or shortness of breath. 05/12/18   Rai, Vernelle Emerald, MD  Artificial Tear Solution (GENTEAL TEARS OP) Apply 1 drop to eye 3 (three) times daily as needed (dry eyes).    [provider]  aspirin EC 325 MG EC tablet Take 1 tablet (325 mg total) by mouth daily. 01/20/15   Nani Skillern, PA-C  benzonatate (TESSALON) 100 MG capsule Take 1 capsule (100 mg total) by mouth 3 (three) times daily as needed for cough. 05/12/18   Rai, Vernelle Emerald, MD  Calcium Carbonate-Vitamin D (CALCIUM-D PO) Take 1 tablet by mouth daily.     [provider]  carboxymethylcellulose (REFRESH PLUS) 0.5 % SOLN Place 1 drop into both eyes as needed  (dry eyes).    [provider]  carvedilol (COREG) 12.5 MG tablet TAKE 1 TABLET BY MOUTH TWICE DAILY WITH A MEAL 04/30/18   Evans Lance, MD  carvedilol (COREG) 6.25 MG tablet Take 1 tablet (6.25 mg total) by mouth 2 (two) times daily. To take with 12.5 mg to make 18.75 mg 03/30/18   Minus Breeding, MD  cetirizine (ZYRTEC) 10 MG tablet Take 10 mg by mouth daily.    [provider]  Docusate Calcium (STOOL SOFTENER PO) Take 2 tablets by mouth daily as needed (constipation).    [provider]  FLUoxetine (PROZAC) 20 MG capsule Take 20 mg by mouth daily.    [provider]  fluticasone (FLONASE) 50 MCG/ACT nasal spray Place 1 spray into the nose 2 (two) times daily.     [provider]  gabapentin (NEURONTIN) 100 MG capsule Take 100 mg by mouth 3 (three) times daily.    [provider]  guaiFENesin (MUCINEX) 600 MG 12 hr tablet Take 1 tablet (600 mg total) by mouth 2 (two) times daily. 05/12/18   Rai, Ripudeep K, MD  guaiFENesin-dextromethorphan (ROBITUSSIN DM) 100-10 MG/5ML syrup Take 5 mLs by mouth every 4 (four) hours as needed for cough. 05/12/18   Rai, Vernelle Emerald, MD  ibuprofen (ADVIL,MOTRIN) 200 MG tablet Take 200 mg by mouth daily as needed for headache or mild pain.     [provider]  insulin glargine (LANTUS) 100 UNIT/ML injection Inject 0.05 mLs (5 Units total) into the skin daily. 05/14/18   Domenic Polite, MD  insulin lispro (HUMALOG) 100 UNIT/ML injection Inject 0.01-0.12 mLs (1-12 Units total) into the skin 3 (three) times daily with meals. Moderate sliding scale Insulin 05/14/18   Domenic Polite, MD  losartan (COZAAR) 50 MG tablet Take 50 mg by mouth daily.    [provider]  montelukast (SINGULAIR) 10 MG tablet Take 10 mg by mouth at bedtime.    [provider]  Multiple Vitamin (MULTIVITAMIN) capsule Take 1 capsule by mouth daily. 02/21/15   Nani Skillern, PA-C  nicotine (NICODERM CQ - DOSED IN  MG/24 HOURS) 21 mg/24hr patch Place 1 patch (21 mg total) onto the skin daily.  05/12/18   Rai, Vernelle Emerald, MD  Olopatadine HCl (PATANASE) 0.6 % SOLN Place 1 spray into the nose daily.     [provider]  pantoprazole (PROTONIX) 40 MG tablet Take 1 tablet (40 mg total) by mouth daily at 6 (six) AM. 05/12/18   Rai, Vernelle Emerald, MD    Family History Family History  Problem Relation Age of Onset  . Pancreatic cancer Mother 34  . Diabetes Father   . Hypertension Father   . Heart disease Father 59       CAD  . Breast cancer Sister   . Diabetes Maternal Grandmother   . Hypertension Maternal Grandmother   . Cancer Maternal Grandfather        colon or stomach  . Hypertension Paternal Grandmother   . Heart disease Paternal Grandmother        Later onset  . Diverticulosis Paternal Grandmother   . Asthma Grandchild     Social History Social History   Tobacco Use  . Smoking status: Former Smoker    Packs/day: 0.50    Years: 50.00    Pack years: 25.00    Types: Cigarettes    Last attempt to quit: 05/04/2018    Years since quitting: 0.0  . Smokeless tobacco: Never Used  . Tobacco comment: uses vapor cig  Substance Use Topics  . Alcohol use: No    Alcohol/week: 0.0 oz  . Drug use: No     Allergies   Patient has no known allergies.   Review of Systems Review of Systems  Constitutional: Negative for chills and fever.  HENT: Negative for congestion and rhinorrhea.   Eyes: Negative for redness and visual disturbance.  Respiratory: Positive for cough and shortness of breath. Negative for wheezing.   Cardiovascular: Negative for chest pain and palpitations.  Gastrointestinal: Negative for nausea and vomiting.  Genitourinary: Negative for dysuria and urgency.  Musculoskeletal: Negative for arthralgias and myalgias.  Skin: Negative for pallor and wound.  Neurological: Negative for dizziness and headaches.     Physical Exam Updated Vital Signs BP 136/84 (BP Location:  Left Arm)   Pulse 100   Temp 98.6 F (37 C) (Rectal)   Resp (!) 25   Ht 5\' 1"  (1.549 m)   Wt 62.1 kg (137 lb)   SpO2 99%   BMI 25.89 kg/m   Physical Exam  Constitutional: She is oriented to person, place, and time. She appears well-developed and well-nourished. No distress.  HENT:  Head: Normocephalic and atraumatic.  Eyes: Pupils are equal, round, and reactive to light. EOM are normal.  Neck: Normal range of motion. Neck supple.  Cardiovascular: Normal rate and regular rhythm. Exam reveals no gallop and no friction rub.  No murmur heard. Pulmonary/Chest: Effort normal. She has no wheezes. She has no rales.  Diffuse rhonchi to all fields  Abdominal: Soft. She exhibits no distension. There is no tenderness.  Musculoskeletal: She exhibits no edema or tenderness.  Neurological: She is alert and oriented to person, place, and time.  Skin: Skin is warm and dry. She is not diaphoretic.  Psychiatric: She has a normal mood and affect. Her behavior is normal.  Nursing note and vitals reviewed.    ED Treatments / Results  Labs (all labs ordered are listed, but only abnormal results are displayed) Labs Reviewed  COMPREHENSIVE METABOLIC PANEL - Abnormal; Notable for the following components:      Result Value   Potassium 3.4 (*)    Chloride 91 (*)  CO2 40 (*)    Calcium 8.2 (*)    Total Protein 5.6 (*)    Albumin 2.0 (*)    ALT 72 (*)    Alkaline Phosphatase 153 (*)    All other components within normal limits  CBC WITH DIFFERENTIAL/PLATELET - Abnormal; Notable for the following components:   RBC 2.80 (*)    Hemoglobin 8.2 (*)    HCT 27.3 (*)    RDW 15.6 (*)    Platelets 424 (*)    Neutro Abs 8.0 (*)    Lymphs Abs 0.5 (*)    All other components within normal limits  I-STAT ARTERIAL BLOOD GAS, ED - Abnormal; Notable for the following components:   pH, Arterial 7.601 (*)    pO2, Arterial 169.0 (*)    Bicarbonate 45.6 (*)    TCO2 47 (*)    Acid-Base Excess 22.0 (*)     All other components within normal limits  CULTURE, BLOOD (ROUTINE X 2)  CULTURE, BLOOD (ROUTINE X 2)  URINALYSIS, ROUTINE W REFLEX MICROSCOPIC  I-STAT CG4 LACTIC ACID, ED  I-STAT CG4 LACTIC ACID, ED    EKG EKG Interpretation  Date/Time:  2018-06-21 11:33:32 EDT Ventricular Rate:  105 PR Interval:  130 QRS Duration: 106 QT Interval:  378 QTC Calculation: 499 R Axis:   -74 Text Interpretation:  Atrial-sensed ventricular-paced rhythm Biventricular pacemaker detected Abnormal ECG No significant change since last tracing Confirmed by Deno Etienne [phone]) on 06/21/2018 12:21:22 PM   Radiology Dg Chest Portable 1 View  Result Date: June 21, 2018 CLINICAL DATA:  Short of breath EXAM: PORTABLE CHEST 1 VIEW COMPARISON:  05/08/2018 FINDINGS: There is extensive airspace disease throughout the right lung. Small right pleural effusion has developed. There is central vascular congestion bilaterally. Left subclavian AICD device is unchanged. Upper normal heart size. No pneumothorax. IMPRESSION: Extensive airspace disease throughout the right lung associated with the right pleural effusion. Central vascular congestion bilaterally. Electronically Signed   By: Marybelle Killings M.D.   On: 21-Jun-2018 12:17    Procedures Procedures (including critical care time)  Medications Ordered in ED Medications  vancomycin (VANCOCIN) 1,250 mg in sodium chloride 0.9 % 250 mL IVPB (1,250 mg Intravenous New Bag/Given Jun 21, 2018 1318)  albuterol (PROVENTIL) (2.5 MG/3ML) 0.083% nebulizer solution 5 mg (5 mg Nebulization Given June 21, 2018 1158)  piperacillin-tazobactam (ZOSYN) IVPB 3.375 g (0 g Intravenous Stopped 06/21/2018 1310)     Initial Impression / Assessment and Plan / ED Course  I have reviewed the triage vital signs and the nursing notes.  Pertinent labs & imaging results that were available during my care of the patient were reviewed by me and considered in my medical decision making (see chart for details).       72 yo F with a chief complaint of shortness of breath.  Patient was recently in the hospital for a bilateral pneumonia and was discharged back to her skilled nursing facility.  While there she has had worsening shortness breath over the past couple days.  Was having worsening confusion and so they called 911.  Arrived in mild respiratory distress.  Diffuse rhonchi.  Chest x-ray with worsening pneumonia on the right side.  We will broaden her antibiotics.  Was hypoxic on 4 L per our monitor though seems to be oxygenating well based on her ABG.  Discussed with hospitalist will admit.  CRITICAL CARE Performed by: Cecilio Asper   Total critical care time: 35 minutes  Critical care time was exclusive of  separately billable procedures and treating other patients.  Critical care was necessary to treat or prevent imminent or life-threatening deterioration.  Critical care was time spent personally by me on the following activities: development of treatment plan with patient and/or surrogate as well as nursing, discussions with consultants, evaluation of patient's response to treatment, examination of patient, obtaining history from patient or surrogate, ordering and performing treatments and interventions, ordering and review of laboratory studies, ordering and review of radiographic studies, pulse oximetry and re-evaluation of patient's condition.   The patients results and plan were reviewed and discussed.   Any x-rays performed were independently reviewed by myself.   Differential diagnosis were considered with the presenting HPI.  Medications  vancomycin (VANCOCIN) 1,250 mg in sodium chloride 0.9 % 250 mL IVPB (1,250 mg Intravenous New Bag/Given 2018-06-09 1318)  albuterol (PROVENTIL) (2.5 MG/3ML) 0.083% nebulizer solution 5 mg (5 mg Nebulization Given Jun 09, 2018 1158)  piperacillin-tazobactam (ZOSYN) IVPB 3.375 g (0 g Intravenous Stopped 2018-06-09 1310)    Vitals:   2018-06-09 1230 06/09/2018  1242 06-09-2018 1245 06-09-2018 1315  BP: (!) 153/87  (!) 151/76 136/84  Pulse: (!) 102  (!) 102 100  Resp: (!) 30  (!) 23 (!) 25  Temp:      TempSrc:      SpO2:  98%  99%  Weight:      Height:        Final diagnoses:  HCAP (healthcare-associated pneumonia)    Admission/ observation were discussed with the admitting physician, patient and/or family and they are comfortable with the plan.    Final Clinical Impressions(s) / ED Diagnoses   Final diagnoses:  HCAP (healthcare-associated pneumonia)    ED Discharge Orders    None       Deno Etienne, DO 06-09-18 [place], DO 06/09/18 1322

## 2018-05-30 NOTE — H&P (Addendum)
History and Physical    Carol Warren JZP:[id number] DOB: Aug 09, 1946 DOA: 2018/05/30  PCP: Ernestene Kiel, MD Patient coming from: facility  Chief Complaint: sob  HPI: Carol Warren is a 72 y.o. female with medical history significant of hypertension, bicuspid aortic valve, aortic aneurysm, ASCVD, GERD, anemia, likely COPD, chronic systolic heart failure, smoker, recent hospitalization for pneumonia,inspected metastatic lung cancer presents to the emergency department from a facility chief complaint worsening shortness of breath increased cough. Initial evaluation reveals respiratory distress likely related to healthcare associated pneumonia and pleural effusion. [facility] are asked to admit  Information is obtained from the chart and the patient. Patient discharged to a nursing facility 12 days ago after a hospitalization for postobstructive pneumonia likely metastatic lung cancer COPD exacerbation. Reportedly patient's family called EMS due to patient's respiratory distress noted while visiting her at the facility. Family also reports decreased level of consciousness. No reports of any fever or Singh cough nausea vomiting. Patient admits to feeling more short of breath than usual. She denies chest pain palpitations abdominal pain nausea. She does admit to some "back pain". She denies dysuria hematuria frequency or urgency. She denies any difficulty chewing or swallowing or choking/coughing with eating/drinking. EMS was called and provided her with nebulizer.     ED Course: in the emergency department she's afebrile hemodynamically stable she does have an increased oxygen demand. Provided with IV antibiotics and nebulizer.  Review of Systems: As per HPI otherwise all other systems reviewed and are negative.   Ambulatory Status: currently bedbound  Past Medical History:  Diagnosis Date  . Adrenal tumor   . AICD (automatic cardioverter/defibrillator) present   . Anxiety   . Aortic  aneurysm ([place])   . Asthma   . Bicuspid aortic valve    CONGENITAL  . Cancer ([place])    skin   . Carpal tunnel syndrome, bilateral   . Chronic depression   . Chronic sinusitis   . 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 COPD (chronic obstructive pulmonary disease) ([place])   . Coronary artery disease   . Degenerative disc disease    CERVICAL AND LUMBAR (DR. Lorin Mercy)  . GERD (gastroesophageal reflux disease)   . HSV-1 (herpes simplex virus 1) infection   . HSV-2 (herpes simplex virus 2) infection   . Hypertension   . Insomnia   . Lumbar stenosis   . Osteoarthritis   . Osteopenia   . Pernicious anemia   . Plantar fasciitis   . Smoker   . Venous insufficiency of left leg   . Vitamin B 12 deficiency     Past Surgical History:  Procedure Laterality Date  . AORTIC VALVE REPLACEMENT N/A 01/12/2015   Procedure: AORTIC VALVE REPLACEMENT (AVR);  Surgeon: Gaye Pollack, MD;  Location: [place];  Service: Open Heart Surgery;  Laterality: N/A;  . ASCENDING AORTIC ROOT REPLACEMENT N/A 01/12/2015   Procedure: ASCENDING AORTIC ROOT REPLACEMENT;  Surgeon: Gaye Pollack, MD;  Location: [facility];  Service: Open Heart Surgery;  Laterality: N/A;  . BI-VENTRICULAR PACEMAKER INSERTION N/A 01/18/2015   Procedure: BI-VENTRICULAR PACEMAKER INSERTION (CRT-P);  Surgeon: Evans Lance, MD;  Location: [facility] CATH LAB;  Service: Cardiovascular;  Laterality: N/A;  . BIOPSY N/A 05/13/2018   Procedure: BIOPSY;  Surgeon: Ronnette Juniper, MD;  Location: [facility];  Service: Gastroenterology;  Laterality: N/A;  . BOTOX INJECTION  05/13/2018   Procedure: BOTOX INJECTION;  Surgeon: Ronnette Juniper, MD;  Location: [place];  Service: Gastroenterology;;  .  BUNIONECTOMY WITH HAMMERTOE RECONSTRUCTION Right   . CARPAL TUNNEL RELEASE Bilateral 2008  . CATARACT EXTRACTION Bilateral    X2  . CATARACT EXTRACTION W/ INTRAOCULAR LENS  IMPLANT, BILATERAL    . COLONOSCOPY    . CYST EXCISION     right side of throat  .  ESOPHAGOGASTRODUODENOSCOPY (EGD) WITH PROPOFOL N/A 05/13/2018   Procedure: ESOPHAGOGASTRODUODENOSCOPY (EGD) WITH PROPOFOL;  Surgeon: Ronnette Juniper, MD;  Location: [facility];  Service: Gastroenterology;  Laterality: N/A;  possible through-the-scope balloon dilatation, possible Botox injection  . EXCISION OF BASAL CELL CA     SKIN   . EXCISION OF VULVAR CIS    . EYE SURGERY    . FUNCTIONAL ENDOSCOPIC SINUS SURGERY    . LEFT AND RIGHT HEART CATHETERIZATION WITH CORONARY ANGIOGRAM N/A 09/23/2014   Procedure: LEFT AND RIGHT HEART CATHETERIZATION WITH CORONARY ANGIOGRAM;  Surgeon: Minus Breeding, MD;  Location: [facility] CATH LAB;  Service: Cardiovascular;  Laterality: N/A;  . MASS EXCISION Right 04/03/2017   Procedure: EXCISION CYST DEBRIDMENT DISTAL INTERPHALANEAL RIGHT INDEX FINGER;  Surgeon: Daryll Brod, MD;  Location: [place];  Service: Orthopedics;  Laterality: Right;  REG/FAB  . MULTIPLE TOOTH EXTRACTIONS    . SINUS PROCEDURE  2009   x 6  . TEE WITHOUT CARDIOVERSION N/A 10/06/2014   Procedure: TRANSESOPHAGEAL ECHOCARDIOGRAM (TEE);  Surgeon: Sueanne Margarita, MD;  Location: [facility] ENDOSCOPY;  Service: Cardiovascular;  Laterality: N/A;  . TEE WITHOUT CARDIOVERSION N/A 01/12/2015   Procedure: TRANSESOPHAGEAL ECHOCARDIOGRAM (TEE);  Surgeon: Gaye Pollack, MD;  Location: [place];  Service: Open Heart Surgery;  Laterality: N/A;  . UPPER GASTROINTESTINAL ENDOSCOPY      Social History   Socioeconomic History  . Marital status: Married    Spouse name: Not on file  . Number of children: 2  . Years of education: Not on file  . Highest education level: Not on file  Occupational History  . Occupation: retired    Fish farm manager: RETIRED  Social Needs  . Financial resource strain: Not on file  . Food insecurity:    Worry: Not on file    Inability: Not on file  . Transportation needs:    Medical: Not on file    Non-medical: Not on file  Tobacco Use  . Smoking status: Former Smoker    Packs/day: 0.50    Years: 50.00     Pack years: 25.00    Types: Cigarettes    Last attempt to quit: 05/04/2018    Years since quitting: 0.0  . Smokeless tobacco: Never Used  . Tobacco comment: uses vapor cig  Substance and Sexual Activity  . Alcohol use: No    Alcohol/week: 0.0 oz  . Drug use: No  . Sexual activity: Not on file  Lifestyle  . Physical activity:    Days per week: Not on file    Minutes per session: Not on file  . Stress: Not on file  Relationships  . Social connections:    Talks on phone: Not on file    Gets together: Not on file    Attends religious service: Not on file    Active member of club or organization: Not on file    Attends meetings of clubs or organizations: Not on file    Relationship status: Not on file  . Intimate partner violence:    Fear of current or ex partner: Not on file    Emotionally abused: Not on file    Physically abused: Not on file  Forced sexual activity: Not on file  Other Topics Concern  . Not on file  Social History Narrative  . Not on file    No Known Allergies  Family History  Problem Relation Age of Onset  . Pancreatic cancer Mother 67  . Diabetes Father   . Hypertension Father   . Heart disease Father 39       CAD  . Breast cancer Sister   . Diabetes Maternal Grandmother   . Hypertension Maternal Grandmother   . Cancer Maternal Grandfather        colon or stomach  . Hypertension Paternal Grandmother   . Heart disease Paternal Grandmother        Later onset  . Diverticulosis Paternal Grandmother   . Asthma Grandchild     Prior to Admission medications   Medication Sig Start Date End Date Taking? Authorizing Provider  acetaminophen (TYLENOL) 325 MG tablet Take 2 tablets (650 mg total) by mouth every 6 (six) hours as needed for mild pain (or Fever >/= 101). 05/14/18   Domenic Polite, MD  albuterol (PROVENTIL HFA;VENTOLIN HFA) 108 (90 BASE) MCG/ACT inhaler Inhale 1-2 puffs into the lungs every 6 (six) hours as needed for wheezing or shortness  of breath (depends on congestion if 1-2 puffs).     [provider]  albuterol (PROVENTIL) (2.5 MG/3ML) 0.083% nebulizer solution Take 3 mLs (2.5 mg total) by nebulization every 6 (six) hours as needed for wheezing or shortness of breath. 05/12/18   Rai, Vernelle Emerald, MD  Artificial Tear Solution (GENTEAL TEARS OP) Apply 1 drop to eye 3 (three) times daily as needed (dry eyes).    [provider]  aspirin EC 325 MG EC tablet Take 1 tablet (325 mg total) by mouth daily. 01/20/15   Nani Skillern, PA-C  benzonatate (TESSALON) 100 MG capsule Take 1 capsule (100 mg total) by mouth 3 (three) times daily as needed for cough. 05/12/18   Rai, Vernelle Emerald, MD  Calcium Carbonate-Vitamin D (CALCIUM-D PO) Take 1 tablet by mouth daily.     [provider]  carboxymethylcellulose (REFRESH PLUS) 0.5 % SOLN Place 1 drop into both eyes as needed (dry eyes).    [provider]  carvedilol (COREG) 12.5 MG tablet TAKE 1 TABLET BY MOUTH TWICE DAILY WITH A MEAL 04/30/18   Evans Lance, MD  carvedilol (COREG) 6.25 MG tablet Take 1 tablet (6.25 mg total) by mouth 2 (two) times daily. To take with 12.5 mg to make 18.75 mg 03/30/18   Minus Breeding, MD  cetirizine (ZYRTEC) 10 MG tablet Take 10 mg by mouth daily.    [provider]  Docusate Calcium (STOOL SOFTENER PO) Take 2 tablets by mouth daily as needed (constipation).    [provider]  FLUoxetine (PROZAC) 20 MG capsule Take 20 mg by mouth daily.    [provider]  fluticasone (FLONASE) 50 MCG/ACT nasal spray Place 1 spray into the nose 2 (two) times daily.     [provider]  gabapentin (NEURONTIN) 100 MG capsule Take 100 mg by mouth 3 (three) times daily.    [provider]  guaiFENesin (MUCINEX) 600 MG 12 hr tablet Take 1 tablet (600 mg total) by mouth 2 (two) times daily. 05/12/18   Rai, Ripudeep K, MD  guaiFENesin-dextromethorphan (ROBITUSSIN DM) 100-10 MG/5ML syrup Take 5 mLs by  mouth every 4 (four) hours as needed for cough. 05/12/18   Rai, Vernelle Emerald, MD  ibuprofen (ADVIL,MOTRIN) 200 MG tablet  Take 200 mg by mouth daily as needed for headache or mild pain.     [provider]  insulin glargine (LANTUS) 100 UNIT/ML injection Inject 0.05 mLs (5 Units total) into the skin daily. 05/14/18   Domenic Polite, MD  insulin lispro (HUMALOG) 100 UNIT/ML injection Inject 0.01-0.12 mLs (1-12 Units total) into the skin 3 (three) times daily with meals. Moderate sliding scale Insulin 05/14/18   Domenic Polite, MD  losartan (COZAAR) 50 MG tablet Take 50 mg by mouth daily.    [provider]  montelukast (SINGULAIR) 10 MG tablet Take 10 mg by mouth at bedtime.    [provider]  Multiple Vitamin (MULTIVITAMIN) capsule Take 1 capsule by mouth daily. 02/21/15   Nani Skillern, PA-C  nicotine (NICODERM CQ - DOSED IN MG/24 HOURS) 21 mg/24hr patch Place 1 patch (21 mg total) onto the skin daily. 05/12/18   Rai, Vernelle Emerald, MD  Olopatadine HCl (PATANASE) 0.6 % SOLN Place 1 spray into the nose daily.     [provider]  pantoprazole (PROTONIX) 40 MG tablet Take 1 tablet (40 mg total) by mouth daily at 6 (six) AM. 05/12/18   Rai, Vernelle Emerald, MD    Physical Exam: Vitals:   06/04/2018 1245 2018/06/04 1315 June 04, 2018 1347 June 04, 2018 1405  BP: (!) 151/76 136/84 128/75   Pulse: (!) 102 100 95   Resp: (!) 23 (!) 25 (!) 27   Temp:      TempSrc:      SpO2:  99% 96% 95%  Weight:      Height:         General:  Appears calm and only slightly uncomfortable Eyes:  PERRL, EOMI, normal lids, iris ENT:  grossly normal hearing, lips & tongue, mucous membranes of her mouth are slightly pale slightly dry Neck:  no LAD, masses or thyromegaly Cardiovascular:  RRR, no m/r/g. trace LE edema.  Respiratory:  Mild increased work of breathing at rest. Rest sounds practically absent on right on left diffuse rhonchi with very faint end expiratory wheezing Abdomen:  soft, ntnd,  sluggish bowel sounds no guarding or rebounding Skin:  no rash or induration seen on limited exam Musculoskeletal:  grossly normal tone BUE/BLE, good ROM, no bony abnormality Psychiatric:  grossly normal mood and affect, speech fluent and appropriate, AOx3 Neurologic:  lethargic but responds to verbal stimuli. Able to follow simple commands.  Labs on Admission: I have personally reviewed following labs and imaging studies  CBC: Recent Labs  Lab 06-04-18 1150  WBC 8.8  NEUTROABS 8.0*  HGB 8.2*  HCT 27.3*  MCV 97.5  PLT 027*   Basic Metabolic Panel: Recent Labs  Lab 06-04-2018 1150  NA 141  K 3.4*  CL 91*  CO2 40*  GLUCOSE 76  BUN 14  CREATININE 0.46  CALCIUM 8.2*   GFR: Estimated Creatinine Clearance: 54.5 mL/min (by C-G formula based on SCr of 0.46 mg/dL). Liver Function Tests: Recent Labs  Lab 2018/06/04 1150  AST 38  ALT 72*  ALKPHOS 153*  BILITOT 0.3  PROT 5.6*  ALBUMIN 2.0*   No results for input(s): LIPASE, AMYLASE in the last 168 hours. No results for input(s): AMMONIA in the last 168 hours. Coagulation Profile: No results for input(s): INR, PROTIME in the last 168 hours. Cardiac Enzymes: No results for input(s): CKTOTAL, CKMB, CKMBINDEX, TROPONINI in the last 168 hours. BNP (last 3 results) No results for input(s): PROBNP in the last 8760 hours. HbA1C: No results for input(s): HGBA1C  in the last 72 hours. CBG: No results for input(s): GLUCAP in the last 168 hours. Lipid Profile: No results for input(s): CHOL, HDL, LDLCALC, TRIG, CHOLHDL, LDLDIRECT in the last 72 hours. Thyroid Function Tests: No results for input(s): TSH, T4TOTAL, FREET4, T3FREE, THYROIDAB in the last 72 hours. Anemia Panel: No results for input(s): VITAMINB12, FOLATE, FERRITIN, TIBC, IRON, RETICCTPCT in the last 72 hours. Urine analysis:    Component Value Date/Time   COLORURINE YELLOW 05/05/2018 0537   APPEARANCEUR CLEAR 05/05/2018 0537   LABSPEC 1.014 05/05/2018 0537    PHURINE 6.0 05/05/2018 0537   GLUCOSEU 50 (A) 05/05/2018 0537   HGBUR SMALL (A) 05/05/2018 0537   BILIRUBINUR NEGATIVE 05/05/2018 0537   KETONESUR NEGATIVE 05/05/2018 0537   PROTEINUR 30 (A) 05/05/2018 0537   UROBILINOGEN 0.2 01/11/2015 0906   NITRITE NEGATIVE 05/05/2018 0537   LEUKOCYTESUR NEGATIVE 05/05/2018 0537    Creatinine Clearance: Estimated Creatinine Clearance: 54.5 mL/min (by C-G formula based on SCr of 0.46 mg/dL).  Sepsis Labs: @LABRCNTIP (procalcitonin:4,lacticidven:4) )No results found for this or any previous visit (from the past 240 hour(s)).   Radiological Exams on Admission: Dg Chest Portable 1 View  Result Date: 06/17/2018 CLINICAL DATA:  Short of breath EXAM: PORTABLE CHEST 1 VIEW COMPARISON:  05/08/2018 FINDINGS: There is extensive airspace disease throughout the right lung. Small right pleural effusion has developed. There is central vascular congestion bilaterally. Left subclavian AICD device is unchanged. Upper normal heart size. No pneumothorax. IMPRESSION: Extensive airspace disease throughout the right lung associated with the right pleural effusion. Central vascular congestion bilaterally. Electronically Signed   By: Marybelle Killings M.D.   On: 2018/06/17 12:17    EKG: Independently reviewed.Atrial-sensed ventricular-paced rhythm Biventricular pacemaker detected Abnormal ECG No significant change  Assessment/Plan Principal Problem:   Acute respiratory distress Active Problems:   HCAP (healthcare-associated pneumonia)   Bicuspid aortic valve   COPD GOLD 0/ still smoking    Severe aortic stenosis   Nonischemic cardiomyopathy ([facility])   ICD (implantable cardioverter-defibrillator), biventricular, in situ   Chronic diastolic CHF (congestive heart failure) ([place])   Hyperglycemia   Metastasis to lymph nodes ([place])   1.Acute respiratory distress likely related to right hilar mass with associated mediastinal lymphadenopathy and probably adrenal metastatic disease  versus healthcare associated pneumonia in the setting of chronic respiratory failure related to COPD. Recent hospitalization revealed lung mass followed up by pulmonology who recommended PET scan to guide planning for tissue diagnosis. Chest x-ray with extensive airspace disease throughout the right lung associated with right pleural effusion as well as central vascular congestion bilaterally. Patient is afebrile no leukocytosis.ABG pH is 7.6 PCO2 46,02 169 bicarbonate 45. Lactic acid within the limits of normalshe received nebulizers as well as IV antibiotics -Admit to telemetry -suspect this is more related to likely metastatic disease vs infectious process -nebulizer -antitussive -continue oxygen supplementation -Follow blood cultures - sputum culture as able -consider pulmonology consult  #2. Healthcare associated pneumonia versus aspiration versus metastatic lung disease. IV antibiotics started in the emergency department. She is afebrile no leukocytosis lactic acid within the limits of normal. Of note during recent hospitalization for pneumonia there was concern for aspiration/dysphagia. Speech therapy evaluation completed and dysphagia 1 diet recommended. EGD completed no stricture noted but were concerns for motility disorder. recommneded manometry as OP.  Recently evaluated by pulmonology on an outpatient basis. Chart review indicates pulmonology opines right hilar mass with associated mediastinal lymphadenopathy and probable adrenal metastatic disease. recommended PET scan to guide planning for tissue diagnosis.  Chest x-ray as noted above. IV antibiotics initiated in the emergency department. -Continue IV antibiotics -See #1  3.diastolic heart failure. Echo done august 2018 showed an EF of 65% with grade 2 diastolic dysfunction. Does not appear overloaded. Home meds include BB and ARB and lasix -monitor intake and output -Obtain daily weights -Measure intake and output -convert oral BB to  IV with parameters  #4. Hyperglycemia. A1c 8.9 last hospitalization. At discharge she was started on low-dose Lantus. Serum glucose on admission 76 -Continue Lantus -Sliding scale  5.anemia. History of same. Hemoglobin 8.2. Chart review indicates this is a little below her baseline. May be contributing to #1 -fobt -monitor -consider transufusing if Hg drops below 8.0.  #6. COPD. Faint wheeze. See above  -scheduled nebs  Of note, discussed with husband rapidity of progression and pulmonology opining right lung mass with associated mediastinal lymphadenopahty and likely adrenal metastatic disease and concept of palliative care and/or comfort care should status worsen.    DVT prophylaxis: lovenox  Code Status: dnr  Family Communication: none present  Disposition Plan: back to facility  Consults called:  Pearline Cables pulm Admission status: inpatient    Radene Gunning MD [facility]  If 7PM-7AM, please contact night-coverage [email or web address] Password [email or web address]  06/06/2018, 2:15 PM

## 2018-05-30 NOTE — Progress Notes (Signed)
Received a call from [facility] of [facility] regarding full body donation. Per Mr. Allean Found, the person in charge is not in the office today and that they will coordinate in AM. Patient placement made aware and per [facility], we need a release paper that would be signed by the family before releasing the body to their institution but the form will be given/faxed in AM by [facility].

## 2018-05-30 NOTE — Progress Notes (Signed)
Received patient from ED at about 1750, patient was transferred from the stretcher to the bed  , at 1753 noted patient stop breathing,no pulse, no heart rate noted. Another RN verified assessment. MD paged and notified about the death. Emotional support given to the family. [place] notified and body is not a candidate for any organ donation with reference # [id number] by Alvira Philips. Per spouse, the body is for donation for a full body donation to [facility]. Patient placement made aware.  Awaiting for more instruction from the patient placement.

## 2018-05-30 DEATH — deceased

## 2018-06-01 ENCOUNTER — Ambulatory Visit: Payer: Medicare Other | Admitting: Adult Health

## 2018-06-03 ENCOUNTER — Ambulatory Visit (HOSPITAL_COMMUNITY): Admit: 2018-06-03 | Payer: Medicare Other | Admitting: Gastroenterology

## 2018-06-03 ENCOUNTER — Encounter (HOSPITAL_COMMUNITY): Payer: Medicare Other

## 2018-06-03 SURGERY — MANOMETRY, ESOPHAGUS

## 2018-06-09 ENCOUNTER — Encounter: Payer: Medicare Other | Admitting: Internal Medicine

## 2018-06-30 ENCOUNTER — Telehealth: Payer: Self-pay | Admitting: Emergency Medicine

## 2018-06-30 NOTE — Telephone Encounter (Signed)
rec'd doc in [place] folder up front office, placed in [place] cubby. Ria Comment is out of the office until 07/01/18; RB out of the office until 07/06/18.

## 2018-06-30 NOTE — Telephone Encounter (Signed)
Form has been placed in Dr. Agustina Caroli folder up front.  Ms. Carol Warren is aware that Dr. Lamonte Sakai is not in the office this week.

## 2018-07-01 NOTE — Telephone Encounter (Signed)
Will route to both RB and [place] for f/u.

## 2018-07-01 NOTE — Telephone Encounter (Signed)
Form has been placed in RB's sign folder. I will speak with Lanny Hurst about taking this over the hospital next week to have RB sign.

## 2018-07-06 NOTE — Telephone Encounter (Signed)
Will keep message in triage until form has been delivered by Lanny Hurst to either triage or Ria Comment.

## 2018-07-06 NOTE — Telephone Encounter (Signed)
Form has been given to [place] to take to [place] in the hospital.

## 2018-07-06 NOTE — Telephone Encounter (Signed)
Form has been returned to me completed. I have spoken with Collie Siad and she is aware that this is ready to be picked up. Form has been placed up front for pick up. Nothing further was needed at this time.

## 2018-07-06 NOTE — Telephone Encounter (Signed)
Form completed to best of my ability, but no formal tissue dx of cancer was ever made. The dx is based on imaging alone, unclear to me whether this will be adequate. Form given to K Cottle to return to office and return to family.

## 2018-07-21 IMAGING — DX DG CHEST 1V PORT
1 series · 1 of 1 positions shown · non-contrast
Comparison: 05/08/2018

CLINICAL DATA: Short of breath

EXAM:
PORTABLE CHEST 1 VIEW

[chest ap]
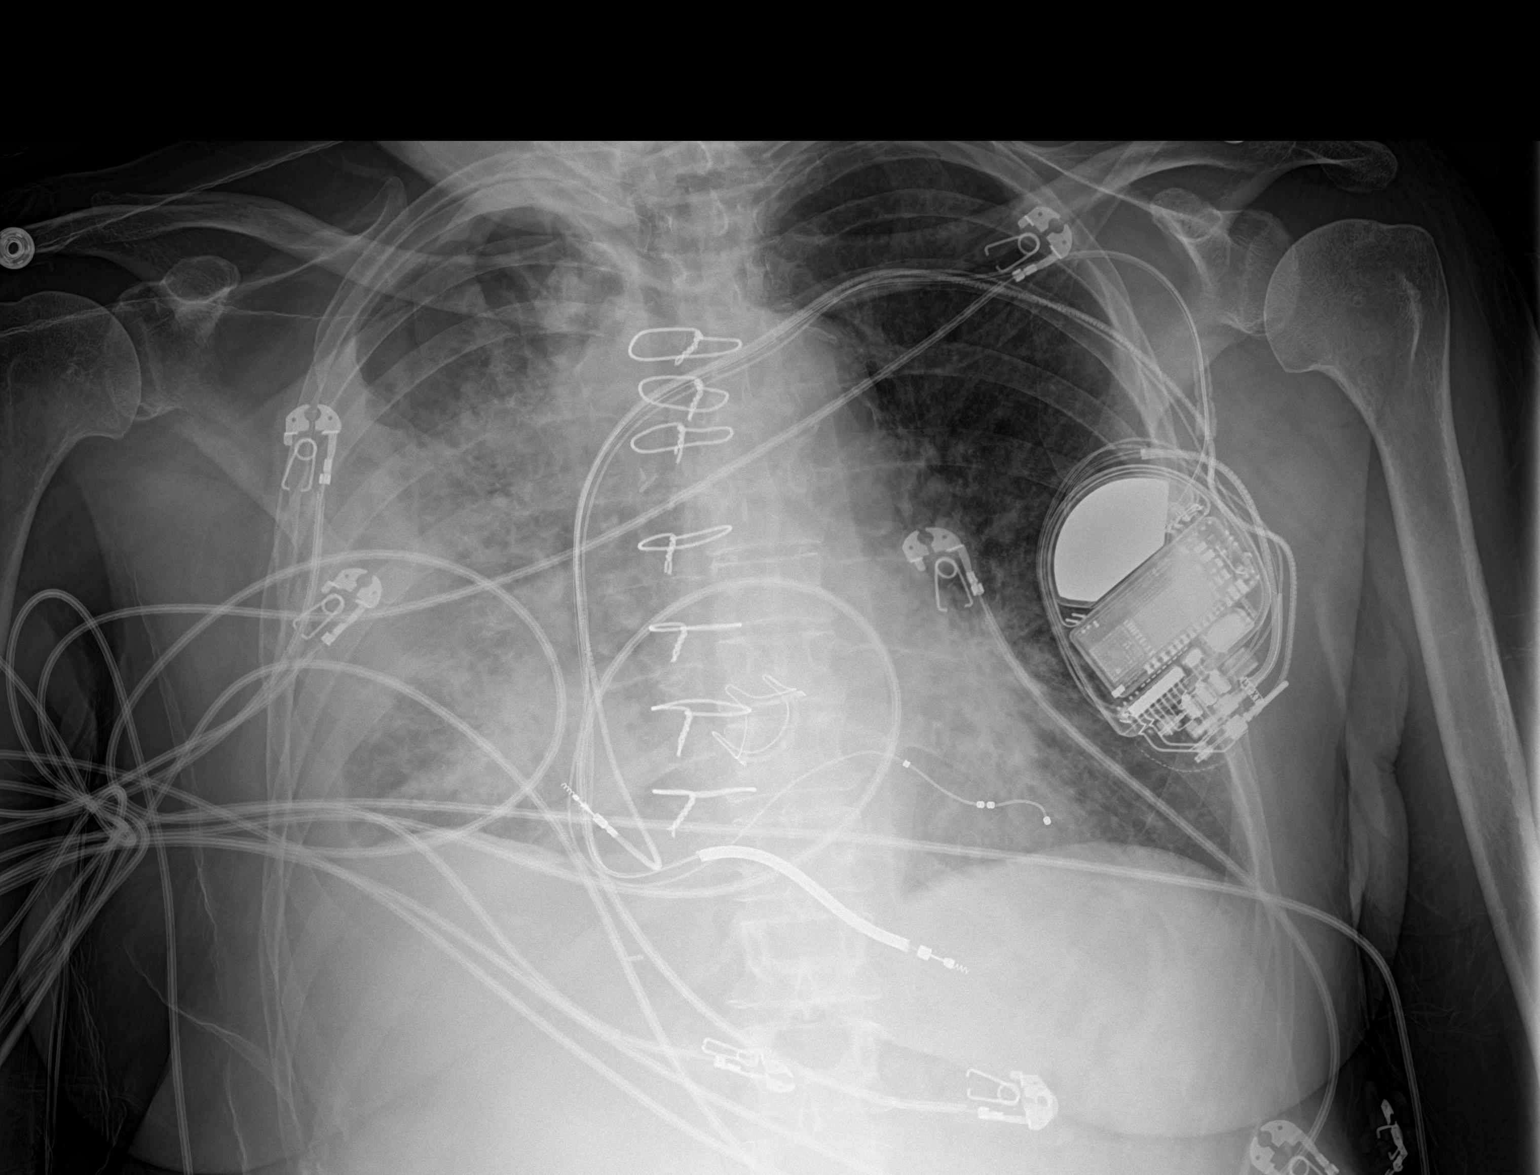

[1 of 1 positions shown; findings below may reference images not displayed]

FINDINGS: There is extensive airspace disease throughout the right lung. Small
right pleural effusion has developed. There is central vascular
congestion bilaterally. Left subclavian AICD device is unchanged.
Upper normal heart size. No pneumothorax.
IMPRESSION: Extensive airspace disease throughout the right lung associated with
the right pleural effusion.

Central vascular congestion bilaterally.

## 2018-07-22 ENCOUNTER — Telehealth: Payer: Self-pay | Admitting: Emergency Medicine

## 2018-07-22 NOTE — Telephone Encounter (Addendum)
I spoke with the patient's husband Mr. Nysha Koplin today regarding his concerns over the care of his wife over the last month of her life.    In summary she had a CT scan of her chest in mid April that showed a R hilar mass. As per my chart review she was discharged from the hospital after treatment for an associated R post-obstructive PNA, then saw me 05/08/18 in office to discuss the R hilar mass, adrenal involvement and to make a strategy for tissue dx. She then unfortunately experienced progressive decline at her SNF before a PET scan was ever done, any biopsy was ever performed. She was taken to ED, was found to have progressive r lung infiltrate - PNA vs cancer involvement. She died soon after presentation.   Mr Mccroskey was concerned on several fronts - first he was concerned that his wife left the hospital too soon when admitted for RUL PNA. He felt that she was not clinically stable for discharge, and that the evaluation of her R hilar mass / metastases should have started while she was an inpatient. I tried to explain to him that it would havce been difficult to attempt a lung biopsy while she was still suffering from PNA, and that one rate-limiting step was the benefit of getting a PET scan, which cannot be done as an inpatient.   He was also concerned that the underlying diagnosis had not been made clear to him at our office visit 5/10. I believe that I made it clear that I was worried about metastatic lung cancer and that she needed a PET and then biopsy asap. I apologized that I did not make this more clear to him at that visit, for any resulting confusion.   Clearly this was an unfortunate chain of events. I firmly believed that we could utilize PET to plan biopsy and get her started on therapy. I regret that she declined before this could be completed.   I tried to answer all of his questions and be as supportive as possible. I offered to attempt to answer any other concerns in the future  should they arise.
